# Patient Record
Sex: Female | Born: 1935
Health system: Southern US, Community
[De-identification: ages and names within clinical notes are randomized; demographics above are authoritative.]

## PROBLEM LIST (undated history)

## (undated) DIAGNOSIS — I10 Essential (primary) hypertension: Secondary | ICD-10-CM

## (undated) DIAGNOSIS — Z973 Presence of spectacles and contact lenses: Secondary | ICD-10-CM

## (undated) DIAGNOSIS — G629 Polyneuropathy, unspecified: Secondary | ICD-10-CM

## (undated) DIAGNOSIS — M542 Cervicalgia: Secondary | ICD-10-CM

## (undated) DIAGNOSIS — Z9889 Other specified postprocedural states: Secondary | ICD-10-CM

## (undated) DIAGNOSIS — Z862 Personal history of diseases of the blood and blood-forming organs and certain disorders involving the immune mechanism: Secondary | ICD-10-CM

## (undated) DIAGNOSIS — Z950 Presence of cardiac pacemaker: Secondary | ICD-10-CM

## (undated) DIAGNOSIS — A419 Sepsis, unspecified organism: Secondary | ICD-10-CM

## (undated) DIAGNOSIS — E119 Type 2 diabetes mellitus without complications: Secondary | ICD-10-CM

## (undated) DIAGNOSIS — K219 Gastro-esophageal reflux disease without esophagitis: Secondary | ICD-10-CM

## (undated) DIAGNOSIS — J449 Chronic obstructive pulmonary disease, unspecified: Secondary | ICD-10-CM

## (undated) DIAGNOSIS — E785 Hyperlipidemia, unspecified: Secondary | ICD-10-CM

## (undated) DIAGNOSIS — J441 Chronic obstructive pulmonary disease with (acute) exacerbation: Secondary | ICD-10-CM

## (undated) DIAGNOSIS — N3941 Urge incontinence: Secondary | ICD-10-CM

## (undated) DIAGNOSIS — K5730 Diverticulosis of large intestine without perforation or abscess without bleeding: Secondary | ICD-10-CM

## (undated) DIAGNOSIS — I442 Atrioventricular block, complete: Secondary | ICD-10-CM

## (undated) DIAGNOSIS — I495 Sick sinus syndrome: Secondary | ICD-10-CM

## (undated) DIAGNOSIS — H409 Unspecified glaucoma: Secondary | ICD-10-CM

## (undated) DIAGNOSIS — J45909 Unspecified asthma, uncomplicated: Secondary | ICD-10-CM

## (undated) DIAGNOSIS — I519 Heart disease, unspecified: Secondary | ICD-10-CM

## (undated) DIAGNOSIS — Z8674 Personal history of sudden cardiac arrest: Secondary | ICD-10-CM

## (undated) DIAGNOSIS — Z8719 Personal history of other diseases of the digestive system: Secondary | ICD-10-CM

## (undated) HISTORY — PX: OTHER SURGICAL HISTORY: SHX169

## (undated) HISTORY — DX: Heart disease, unspecified: I51.9

## (undated) HISTORY — PX: ABDOMINAL HYSTERECTOMY: SHX81

## (undated) HISTORY — DX: Cervicalgia: M54.2

## (undated) HISTORY — DX: Chronic obstructive pulmonary disease with (acute) exacerbation: J44.1

## (undated) HISTORY — DX: Sepsis, unspecified organism: A41.9

## (undated) HISTORY — PX: RECTOCELE REPAIR: SHX761

## (undated) HISTORY — PX: COLONOSCOPY WITH ESOPHAGOGASTRODUODENOSCOPY (EGD): SHX5779

---

## 1988-03-12 HISTORY — PX: CATARACT EXTRACTION W/ INTRAOCULAR LENS  IMPLANT, BILATERAL: SHX1307

## 1997-11-20 ENCOUNTER — Emergency Department (HOSPITAL_COMMUNITY): Admission: EM | Admit: 1997-11-20 | Discharge: 1997-11-20 | Payer: Self-pay | Admitting: Emergency Medicine

## 1997-12-30 ENCOUNTER — Ambulatory Visit (HOSPITAL_COMMUNITY): Admission: RE | Admit: 1997-12-30 | Discharge: 1997-12-30 | Payer: Self-pay | Admitting: Internal Medicine

## 1997-12-30 ENCOUNTER — Encounter (HOSPITAL_BASED_OUTPATIENT_CLINIC_OR_DEPARTMENT_OTHER): Payer: Self-pay | Admitting: Internal Medicine

## 1999-01-06 ENCOUNTER — Encounter (HOSPITAL_BASED_OUTPATIENT_CLINIC_OR_DEPARTMENT_OTHER): Payer: Self-pay | Admitting: Internal Medicine

## 1999-01-06 ENCOUNTER — Ambulatory Visit (HOSPITAL_COMMUNITY): Admission: RE | Admit: 1999-01-06 | Discharge: 1999-01-06 | Payer: Self-pay | Admitting: Internal Medicine

## 2000-01-09 ENCOUNTER — Encounter (HOSPITAL_BASED_OUTPATIENT_CLINIC_OR_DEPARTMENT_OTHER): Payer: Self-pay | Admitting: Internal Medicine

## 2000-01-09 ENCOUNTER — Ambulatory Visit (HOSPITAL_COMMUNITY): Admission: RE | Admit: 2000-01-09 | Discharge: 2000-01-09 | Payer: Self-pay | Admitting: Internal Medicine

## 2000-05-23 ENCOUNTER — Ambulatory Visit (HOSPITAL_COMMUNITY): Admission: RE | Admit: 2000-05-23 | Discharge: 2000-05-23 | Payer: Self-pay | Admitting: Gastroenterology

## 2001-02-04 ENCOUNTER — Ambulatory Visit (HOSPITAL_COMMUNITY): Admission: RE | Admit: 2001-02-04 | Discharge: 2001-02-04 | Payer: Self-pay | Admitting: Internal Medicine

## 2001-02-04 ENCOUNTER — Encounter: Payer: Self-pay | Admitting: Internal Medicine

## 2002-02-23 ENCOUNTER — Ambulatory Visit (HOSPITAL_COMMUNITY): Admission: RE | Admit: 2002-02-23 | Discharge: 2002-02-23 | Payer: Self-pay | Admitting: Internal Medicine

## 2002-02-23 ENCOUNTER — Encounter: Payer: Self-pay | Admitting: Internal Medicine

## 2002-10-19 ENCOUNTER — Ambulatory Visit (HOSPITAL_COMMUNITY): Admission: RE | Admit: 2002-10-19 | Discharge: 2002-10-19 | Payer: Self-pay | Admitting: Internal Medicine

## 2003-03-03 ENCOUNTER — Ambulatory Visit (HOSPITAL_COMMUNITY): Admission: RE | Admit: 2003-03-03 | Discharge: 2003-03-03 | Payer: Self-pay | Admitting: Internal Medicine

## 2004-04-14 ENCOUNTER — Ambulatory Visit (HOSPITAL_COMMUNITY): Admission: RE | Admit: 2004-04-14 | Discharge: 2004-04-14 | Payer: Self-pay | Admitting: Internal Medicine

## 2005-03-21 ENCOUNTER — Ambulatory Visit: Payer: Self-pay | Admitting: Gastroenterology

## 2005-03-21 ENCOUNTER — Inpatient Hospital Stay (HOSPITAL_COMMUNITY): Admission: EM | Admit: 2005-03-21 | Discharge: 2005-03-23 | Payer: Self-pay | Admitting: Emergency Medicine

## 2005-03-22 ENCOUNTER — Ambulatory Visit: Payer: Self-pay | Admitting: Gastroenterology

## 2005-03-23 ENCOUNTER — Encounter: Payer: Self-pay | Admitting: Gastroenterology

## 2005-05-14 ENCOUNTER — Ambulatory Visit (HOSPITAL_COMMUNITY): Admission: RE | Admit: 2005-05-14 | Discharge: 2005-05-14 | Payer: Self-pay | Admitting: Internal Medicine

## 2005-05-17 ENCOUNTER — Encounter: Admission: RE | Admit: 2005-05-17 | Discharge: 2005-05-17 | Payer: Self-pay | Admitting: Internal Medicine

## 2005-11-05 ENCOUNTER — Ambulatory Visit: Payer: Self-pay | Admitting: Gastroenterology

## 2005-11-08 ENCOUNTER — Ambulatory Visit: Payer: Self-pay | Admitting: Gastroenterology

## 2005-11-09 ENCOUNTER — Ambulatory Visit (HOSPITAL_COMMUNITY): Admission: RE | Admit: 2005-11-09 | Discharge: 2005-11-09 | Payer: Self-pay | Admitting: Gastroenterology

## 2005-11-09 ENCOUNTER — Encounter: Payer: Self-pay | Admitting: Internal Medicine

## 2005-11-14 ENCOUNTER — Inpatient Hospital Stay (HOSPITAL_COMMUNITY): Admission: EM | Admit: 2005-11-14 | Discharge: 2005-11-14 | Payer: Self-pay | Admitting: Emergency Medicine

## 2005-11-20 ENCOUNTER — Ambulatory Visit: Payer: Self-pay | Admitting: Internal Medicine

## 2005-12-07 ENCOUNTER — Ambulatory Visit: Payer: Self-pay | Admitting: Gastroenterology

## 2006-06-17 ENCOUNTER — Encounter: Admission: RE | Admit: 2006-06-17 | Discharge: 2006-06-17 | Payer: Self-pay | Admitting: Internal Medicine

## 2006-06-19 ENCOUNTER — Ambulatory Visit (HOSPITAL_COMMUNITY): Admission: RE | Admit: 2006-06-19 | Discharge: 2006-06-19 | Payer: Self-pay | Admitting: Internal Medicine

## 2006-11-04 ENCOUNTER — Ambulatory Visit: Payer: Self-pay | Admitting: Internal Medicine

## 2007-07-21 ENCOUNTER — Encounter: Admission: RE | Admit: 2007-07-21 | Discharge: 2007-07-21 | Payer: Self-pay | Admitting: Internal Medicine

## 2008-08-16 ENCOUNTER — Encounter: Admission: RE | Admit: 2008-08-16 | Discharge: 2008-08-16 | Payer: Self-pay | Admitting: Internal Medicine

## 2009-09-16 ENCOUNTER — Encounter: Admission: RE | Admit: 2009-09-16 | Discharge: 2009-09-16 | Payer: Self-pay | Admitting: Internal Medicine

## 2010-03-12 HISTORY — PX: INTERSTIM IMPLANT PLACEMENT: SHX5130

## 2010-04-02 ENCOUNTER — Encounter: Payer: Self-pay | Admitting: Internal Medicine

## 2010-07-25 NOTE — Assessment & Plan Note (Signed)
[facility]                         GASTROENTEROLOGY OFFICE NOTE   HILLIARY, JOCK                     MRN:          [id number]  DATE:11/04/2006                            DOB:          [birth year]    CHIEF COMPLAINT:  Incontinence.   Ms. Wendy Velasquez is a lady who has been seen by Dr. Corinda Gubler over time.  Her  problems are:  1. Chronic occult blood loss anemia.  2. Cameron's erosions and large hiatal hernia causing #1.  3. Diverticulosis, last colonoscopy January 2007.  4. Small-bowel capsule endoscopy showing Cameron's erosions.  5. Diabetes mellitus type 2 on insulin.  6. Chronic obstructive pulmonary disease.  7. Asthmatic bronchitis.  8. Urinary incontinence, chronic.  9. Seasonal allergies.   She has been having problems with small seepage of stool off and on.  It  is loose.  She is on Colace two a day along with her iron therapy to try  to prevent constipation.  Her other medications also reviewed in the  chart.  No bleeding noted.  Her hemoglobin was normal in April though  her iron saturation was still low and her TIBC was still high.   Medications listed and reviewed in the chart.  No known drug allergies.   Weight 161 pounds, pulse 64, blood pressure 138/80.  Rectal exam in the  presence of female nursing staff shows lax resting sphincter tone,  decreased squeeze.  There is no rectocele, there is no mass.   ASSESSMENT:  1. Fecal incontinence on the basis of lax sphincter tone.  She says      her problems with urinary and fecal incontinence started after a      hysterectomy so presumably this is some type of problem from that.      She had three children and no significant birth trauma.  2. Chronic occult blood loss anemia secondary to Cameron's erosions.  3. Hiatal hernia.   PLAN:  1. Continue current therapy which includes Nexium and iron therapy.      She will need regularly scheduled CBC.  She just had one from Dr.  Wylene Simmer and he is to send the results to me, she says.  I told her      she can go to one iron a day I think.  She is going to need to stay      on that chronically and probably have three to four CBCs a year,      perhaps quarterly.  2. I do not think she has ever really had colon polyps in the past, so      the next one at the earliest would be 5 years and probably 10, at      which point she would be well into her 59s.  Otherwise, as needed      appropriate due to signs or symptoms.   ADDENDUM:  We will ask her to try some fiber supplements like Metamucil  wafers or caplets, or perhaps Fibercon, though she was given samples of  the Metamucil preparations to try to help with her incontinence.  This  might work  better than taking Colace, as that may be aggravating things.     Iva Boop, MD,FACG  Electronically Signed    CEG/MedQ  DD: 11/04/2006  DT: 11/05/2006  Job #: [id number]   cc:   Gaspar Garbe, M.D.

## 2010-07-25 NOTE — Assessment & Plan Note (Signed)
[facility]                         GASTROENTEROLOGY OFFICE NOTE   NAME:Baller, Wendy Velasquez                     MRN:          [id number]  DATE:11/04/2006                            DOB:          16-Dec-1925    ADDENDUM  We will ask her to try some fiber supplements like Metamucil wafers or  caplets, or perhaps Fibercon, though she was given samples of the  Metamucil preparations to try to help with her incontinence.  This might  work better than taking Colace, as that may be aggravating things.     Iva Boop, MD,FACG  Electronically Signed    CEG/MedQ  DD: 11/04/2006  DT: 11/05/2006  Job #: [id number]

## 2010-07-28 NOTE — Discharge Summary (Signed)
NAMEBERNETTA, Wendy Velasquez              ACCOUNT NO.:  [id number]   MEDICAL RECORD NO.:  [id number]          PATIENT TYPE:  INP   LOCATION:  1413                         FACILITY:  [facility]   PHYSICIAN:  Malcolm T. Russella Dar, M.D. [facility] OF BIRTH:  09/19/1925   DATE OF ADMISSION:  03/21/2005  DATE OF DISCHARGE:  03/23/2005                                 DISCHARGE SUMMARY   ADMITTING DIAGNOSES:  32.  A 77 year old female with severe anemia, microcytic, currently heme-      negative.  Rule out chronic occult gastrointestinal loss, rule out iron      deficiency secondary to malabsorption.  2.  Chronic obstructive pulmonary disease  3.  Asthmatic bronchitis.  4.  Insulin-dependent diabetes mellitus.   DISCHARGE DIAGNOSES:  1.  Anemia likely secondary to chronic gastrointestinal blood loss from      [facility] erosions in hiatal hernia.  2.  Iron deficiency anemia, improved, post transfusions.  3.  Asthmatic bronchitis.  4.  Insulin-dependent diabetes mellitus.   CONSULTATIONS:  None.   PROCEDURES:  1.  Upper endoscopy.  2.  Colonoscopy.   HISTORY OF PRESENT ILLNESS:  Wendy Velasquez is a pleasant, 77 year old, white female  known to Dr. Victorino Dike who does have a history of prior GI workup in 2002,  done for anemia.  Colonoscopy showed diverticulosis and she had a small area  of inflammation in the rectum.  Biopsies were benign. Endoscopy showed some  friable gastric mucosal folds on the distal esophageal stricture which was  dilated.  She was referred today to Dr. Corinda Gubler for evaluation of her  anemia.  Recent finding of hemoglobin of 7.1, MCV of 60.  The patient  reports that she had not been well since October 2006, when she developed  bronchitis and has had some progressive shortness of breath and fatigue  since that time.  She denied any abdominal discomfort, nausea, vomiting,  dysphasia, etc.  Her bowel habits have been normal.  She had not noted any  melena or hematochezia.  She does take one  baby aspirin per day.  No other  thinners or NSAIDs.  She was seen in our office today and noted to be weak  and short of breath, though hemodynamically stable.  She was admitted to the  hospital for transfusions and further diagnostic workup.  Of note, stool on  admission was Hemoccult negative.   LABORATORY DATA AND X-RAY FINDINGS:  Laboratory studies on admission WBC of  7.8, hemoglobin 8, hematocrit of 26.7, MCV of 60, platelets 539.  Post  transfusion hemoglobin 10.5, hematocrit of 33.6.  Protime 13.5, INR 1.  Electrolytes within normal limits.  Glucose was 196, BUN 20, creatinine 1.0,  albumin 3.6.  Iron studies with iron less than 10, saturation not  calculated.  B12 level and folate level both normal.  Ferritin level low at  3.  Liver function studies normal.   X-ray studies with chest x-ray on admission with COPD, moderate to large  hiatal hernia.   HOSPITAL COURSE:  The patient was admitted to the service of Dr. Victorino Dike  and cared for by  Dr. Claudette Head who was covering the hospital.  She was  initially transfused 2 units of packed RBCs and was noted Hemoccult  negative.  Iron studies were consistent with iron deficiency. She was  scheduled for upper endoscopy and a colonoscopy on January 12, and underwent  prep on January 11.  Colonoscopy showed a left colon diverticulosis and  upper endoscopy showed a benign stricture in the distal esophagus.  A large,  8-cm hiatal hernia in the cardia of the stomach.  She was noted to have  erosions consistent with Sheria Lang erosions and it was felt that these were  the likely source of her chronic blood loss anemia.   The patient tolerated procedures without difficulty and was allowed  discharge to home later that day.   FOLLOW UP:  Follow up with Dr. Wylene Simmer in 2 weeks.  Dr. Victorino Dike as  needed.   DIET:  Diabetic as previous.   DISCHARGE MEDICATIONS:  All medications same as on admission with the  exception that she was to  take the Nexium 40 mg p.o. every day, which she  had not been doing.  We changed her Lasix to 20 mg daily and Nu-Iron 150 mg  twice daily which will be long-term.   CONDITION ON DISCHARGE:  Stable and improved.      Amy Esterwood, P.A.-C. [facility]      Malcolm T. Russella Dar, M.D. [facility]  Electronically Signed    AE/MEDQ  D:  04/06/2005  T:  04/07/2005  Job:  [id number]   cc:   Gaspar Garbe, M.D.  Fax: [phone]

## 2010-07-28 NOTE — Op Note (Signed)
NAMEAYEN, VIVIANO              ACCOUNT NO.:  [id number]   MEDICAL RECORD NO.:  [id number]          PATIENT TYPE:  AMB   LOCATION:  ENDO                         FACILITY:  [facility]   PHYSICIAN:  Iva Boop, MD,FACGDATE OF BIRTH:  February 03, 1926   DATE OF PROCEDURE:  11/09/2005  DATE OF DISCHARGE:  11/09/2005                                 OPERATIVE REPORT   REQUESTING PHYSICIAN:  Terrial Rhodes, MD.   PROCEDURE:  Small bowel capsule endoscopy.   INDICATIONS:  A 77 year old woman with chronic recurrent iron-deficiency  anemia thought due to occult blood loss.   PROCEDURE DATA:  Height 64 inches, weight 150 pounds, normal build.  Gastric  passage time is somewhat prolonged at 2 hours 4 minutes.  Small bowel  passage time 3 hours 20 minutes.   FINDINGS:  1. This is a complete study.  2. There was a good prep.  3. There was distal esophageal inflammation which was mild.  4. There were erosions to the hiatal hernia sac consistent with Cameron's      erosions.  5. The capsule spent over an hour in the hiatal hernia.  6. No small bowel AVMs or other lesions or sources of anemia identified.   RECOMMENDATIONS AND PLAN:  1. I think the anemia may be due to Cameron's erosions.  2. If there were symptoms of dysphagia or other clinical concerns, an      upper GI series may be appropriate to assess the hiatal hernia and      exclude a paraesophageal component, which could required surgery if she      were a surgical candidate.      Iva Boop, MD,FACG  Electronically Signed     CEG/MEDQ  D:  11/16/2005  T:  11/17/2005  Job:  [id number]

## 2010-07-28 NOTE — Assessment & Plan Note (Signed)
[facility]                           GASTROENTEROLOGY OFFICE NOTE   NAME:TOWNSENDEmiyah, Spraggins                     MRN:          [id number]  DATE:11/05/2005                            DOB:          31-Jul-1925    This very sweet patient comes in with her 2 daughters to discuss her anemia  that has been questionable internal bleeding.  She has been taking iron up  to 3 a day as her per primary care doctor, Dr. Wylene Simmer.   She has been diagnosed with Sheria Lang erosions.  This was assessed by Dr.  Russella Dar in January and she has known significant hiatal hernia that  precipitates this occurrence.  She has taken Nexium tablets but has not been  consistent with this.  She has taken iron in the past but has not been  consistent with it.   Her other medications include Humulin, Humalog, Glucophage, Ziac, Diovan,  Ditropan, amitriptyline, Nexium 40 mg daily, sometimes twice a day, Reglan  at bedtime a half, Advair, Flonase, Lumigan, Lasix 40 mg, steroids in  different doses, iron as noted and Combivent.   Last colonoscopic examination was done by Dr. Russella Dar in January as well and  she was found to have diverticular disease and nothing else of significance.  Hiatal hernia was noted at 8 cm.  She had Cameron erosions.  Dr. Russella Dar felt  like that she should be on long-term iron replacement.   MEDICAL PROBLEMS:  1. Have included COPD.  2. Asthmatic bronchitis.  3. Insulin-dependent diabetes.   PHYSICAL EXAMINATION:  Today, she is very pale.  Her blood pressure is  138/78, her pulse is 68 and regular.  Her oropharynx was normal.  HEAD AND NECK:  Negative.  Her thyroid was normal to palpation.  HEART:  Revealed a regular rhythm without a significant murmur.  No bruits  or rubs were noted.  CHEST:  Clear.  ABDOMEN:  Soft, no mass or organomegaly.  It was nontender.  There were no  inguinal nodes noted.  EXTREMITIES:  Unremarkable.   IMPRESSION:  1. Significant iron  deficiency anemia probably related to the [facility]      erosions.  2. Insulin-dependent diabetes.  3. Chronic obstructive pulmonary disease.  4. Asthmatic bronchitis.  5. Diverticulosis.   RECOMMENDATIONS:  To have her return home under her daughters' care and we  will get blood studies on her to see where she stands today.  She is quite  weak but her vital signs were normal.  I told her that she should be on  Nexium b.i.d. for at least another week or 10 days and then she can go back  to one a day and I also want to put her on some Carafate suspension which I  gave her to take 1 teaspoon t.i.d. between meals.  If this is giving her  relief, we also discussed discontinuing her Reglan at bedtime, which I  really do not think is very effective and she should elevate the head of her  bed if possible.  She should stay on iron and be followed on every several  months  with CBC to see what is happening to her blood count.  In the  meantime, we also would like to get a capsule endoscopy on her to see if we  can determine any other possible links to this anemia.  I think it certainly  could result from her Sheria Lang erosions.  I really do not see any need to  reassess this situation.  We will just treat it the same and I think that  capsular endoscopy would be important in that she has had this chronic  anemia and we want to rule out any other possibilities of the remaining GI  tract that may be something more definitive could be performed.  I told her  daughter to call us if she has any further difficulty.                                   Ulyess Mort, MD   SML/MedQ  DD:  11/05/2005  DT:  11/06/2005  Job #:  [id number]   cc:   Gaspar Garbe, MD

## 2010-07-28 NOTE — Discharge Summary (Signed)
Wendy Velasquez, Wendy Velasquez              ACCOUNT NO.:  [id number]   MEDICAL RECORD NO.:  [id number]          PATIENT TYPE:  INP   LOCATION:  1613                         [facility]ACILITY[facility]   PHYSICIAN:  Gaspar Garbe, M.D.DATE OF BIRTH:  [birth year]   DATE OF ADMISSION:  11/13/2005  DATE OF DISCHARGE:  11/14/2005                                 DISCHARGE SUMMARY   DIAGNOSES:  1. Weakness not otherwise specified.  2. Anemia requiring transfusion.  3. Chronic gastrointestinal (GI) blood loss, site unknown.  4. Diabetes mellitus type 2.  5. Chronic obstructive pulmonary disease (COPD) with recent exacerbations      in the past month.  6. Iron deficiency anemia as noted above.  7. Urinary incontinence.  8. Seasonal allergies.   DISCHARGE MEDICATIONS:  1. Glucophage 1000 mg p.o. b.i.d.  2. Humulin N 10 units subcutaneous b.i.d.  3. Sliding scale Humalog as usual.  4. Nexium 40 mg p.o. b.i.d.  5. Nu-Iron one tablet three times daily with vitamin C at meals.  6. Advair 250/50 one puff twice daily.  7. Flonase p.r.n.  8. Singulair 10 mg p.o. daily.  9. Ditropan XL 15 mg q.h.s.  10.Atrovent inhaler 2 puffs q.i.d.  11.Carafate one dose three times daily.   PROCEDURE:  The patient underwent transfusion of two units of packed red  blood cells.  No other procedures were performed.   LABS:  Initial hemoglobin 9.8, post transfusion 13.4   DISCHARGE PHYSICAL EXAMINATION:  VITAL SIGNS:  On date of discharge,  temperature 98, pulse 73, respiratory rate 20, blood pressure slightly  elevated at 144/70.  CBG 116.  Saturation 93% on room air.  GENERAL:  In no acute distress.  HEENT:  Normocephalic, atraumatic.  PERRLA.  EOMI.  ENT is within normal  limits.  NECK:  Supple.  No lymphadenopathy, JVD, or bruits.  HEART:  Regular rate and rhythm without murmur, rub, or gallop.  LUNGS:  Clear to auscultation bilaterally.  ABDOMEN:  Soft, nontender.  Normoactive bowel sounds.  No  hepatosplenomegaly  appreciated.  The patient is guaiac negative.  EXTREMITIES:  No clubbing, cyanosis, or edema.   HOSPITAL COURSE:  The patient was admitted by my partner, Dr. Ernestina Patches, late on the evening of November 13, 2005.  The patient indicates  that she had been in contact with [facility] extensively over the weekend,  calling their office.  Her family was notified that she would not be offered  an office visit and to go to the emergency room.  She was at the emergency  room for approximately 7 hours during which time [facility] was apparently  not notified.  My partner was notified in their place indicating that the  patient required admission for transfusion.  He graciously accepted the  patient and transfused her two units of packed red blood cells.  Ongoing  over the prior week was the fact that she had undergone capsule endoscopy  the previous Friday as the patient has an issue with chronic GI blood loss.  She has undergone colonoscopy which had not found a source, endoscopy which  was presumed to have seen Cameron's lesions although no active bleeding has  ever been noted in her and on the date of discharge, I spoke personally with  Dr. Victorino Dike who did not have a result in her capsule endoscopy.  Of  note, the patient had attempted to be placed on IV iron and had a reaction  to this in the past.  Therefore, she requires p.o. maintenance.  She was  transfused with two units of packed red blood cells and underwent a six-  minute walk test without desaturation and was discharged home to her usual  care with no changes made in her medications.  I indicated that she needed  to contact Dr. Blossom Hoops office within the next day to get the results of  her endoscopy and for them to further complete her workup.  The patient was  satisfied with this and was discharged home into the care of her family.  [place] and I  indicated  that if she has any further difficulty, she is to contact her doctor up  there who is a pulmonologist who has done other blood testing for [place] given  her absence from [place].   DISCHARGE INSTRUCTIONS:  The patient is to notify us of any dark tarry or  suspicious-looking bowel movements or any bleeding immediately if this  occurs.  If she becomes short of breath especially while away, she is to  [place]up and care  and to notify Dr. Blossom Hoops office if any GI blood loss issues.  The patient  upon her return which is to be September 26 will contact my office and  arrange for a CBC to be drawn at which time other iron interventions may be  necessary as she continues to have low iron saturations which are most  likely causing her weakness.      Gaspar Garbe, M.D.  Electronically Signed     RWT/MEDQ  D:  11/14/2005  T:  11/15/2005  Job:  [id number]   cc:   Ulyess Mort, MD  [address]  [place]  [place] [address]

## 2010-07-28 NOTE — Assessment & Plan Note (Signed)
[facility]                           GASTROENTEROLOGY OFFICE NOTE   NAME:Wendy Velasquez, Wendy Velasquez                     MRN:          [id number]  DATE:12/07/2005                            DOB:          [birth year]    This sweet lady comes in after having several transfusions which she was  found to be quite anemic one month previous.  She says she seems to be doing  better although she is tired.  Her reflux has been bad.  She has had no  visible blood.  She has taken a number of medications including Humulin  insulin, Humalog, Glucophage, Diovan, Ditropan, amitriptyline, Nexium,  Reglan, Advair, Flonase, Lumigan, Lasix, steroid shorts, iron three times  daily, prednisone, Carafate and __________ .  She recently had repeat blood  studies by Dr. Wylene Simmer, the last one being on September 26 and her blood  studies were excellent with a hemoglobin 13.2 and hematocrit 28.5.  MCV was  somewhat low at 74.9 which goes along with her iron deficiency.  Platelet  count and everything else was quite normal.  I told her that she needed to  take her Nexium twice daily and if her insurance company would not allow  this, we needed to write a letter for the insurance company to do so.  She  says her reflux has been good.  I really would not increase her Reglan.  The  capsule endoscopy which she tolerated quite nicely failed to reveal any  mucosal lesions.  This was a complete study, a good prep.  There were  erosions and hiatal hernia, consistent with Sheria Lang erosions.  There were no  small bowel arteriovenous malformations or other lesions  or source of  lesions to explain the anemia.  Dr. Leone Payor interpreted this and felt that  the anemia was secondary to [facility] erosions.   PHYSICAL EXAMINATION:  She did not look nearly as pale as when I had seen  her in the past.  Blood pressure 130/78, pulse 68.  Alert and oriented.  Extremities are unremarkable.   IMPRESSION:  1.  Chronic anemia probably related to bleeding secondary to [facility]      erosions.  The patient has a large hiatal hernia and reflux.  2. Insulin dependent diabetes.  3. Chronic obstructive pulmonary disease.  4. Asthmatic bronchitis.  5. History of diverticular disease.   RECOMMENDATIONS:  That she have blood counts.  I have written a prescription  for her to get it in [place], [place].  In a month she should get a  repeat CBC in a month in a hospital in [place], [place].  She is to  continue iron twice daily.  I also will order some serum iron binding  studies at that time to determine exactly how much iron she needed.  I told  her to call us if she had any problems and I did apologize several times for  the problems she experienced while she was in the hospital and had to sit in  the emergency room for such a long period of time which was ascribed to some  poor communication on our part. She was very receptive of this and it was  just one of these unfortunate things that occurred.  She has been extremely  nice people and they do have three children, grandchildren, live in  [place] so  they come here and can be followed here and I think that they appreciate the  opportunity to get their medical care here.            ______________________________  Ulyess Mort, MD      SML/MedQ  DD:  12/07/2005  DT:  12/10/2005  Job #:  [id number]   cc:   Gaspar Garbe, M.D.

## 2010-07-28 NOTE — H&P (Signed)
Wendy Velasquez, Wendy Velasquez              ACCOUNT NO.:  [id number]   MEDICAL RECORD NO.:  [id number]          PATIENT TYPE:  INP   LOCATION:  0101                         FACILITY:  [facility]   PHYSICIAN:  Ulyess Mort, M.D. LHCDATE OF BIRTH:  May 29, 1925   DATE OF ADMISSION:  03/21/2005  DATE OF DISCHARGE:                                HISTORY & PHYSICAL   CHIEF COMPLAINT:  Weakness and shortness of breath with exertion.   HISTORY:  Wendy Velasquez is a pleasant 77 year old white female known to Dr. Victorino Dike with history of prior GI workup in 2002 done for anemia. Colonoscopy  at that time showed diverticulosis and a 3 cm area of inflammation in the  rectum. Biopsies were taken and showed nonspecific inflammatory changes. EGD  was also done showing some friable mucosal folds, distal esophageal  stricture which was Maloney dilated.   The patient does have history of insulin-dependent diabetes mellitus, COPD,  asthmatic bronchitis, and glaucoma. She was referred to Dr. Corinda Gubler today  for evaluation of her anemia with recent finding of hemoglobin 7.1; MCV is  60. The patient reports that she has not been well since October 2006 when  she had bronchitis. She has had some progressive shortness of breath and  fatigue; has no complaints of abdominal discomfort, nausea, vomiting, and no  dysphagia. Bowel movements have been normal. There is no melena or  hematochezia. She does take one baby aspirin per day, no other thinners or  NSAIDs. She was seen in the office today and noted to be weak and short of  breath though hemodynamically stable, and is admitted for transfusions and  further diagnostic evaluation. Stool on admission hemoccult negative.   MEDICATIONS:  1.  Humulin N 10 units a.m. and p.m. daily.  2.  Humalog sliding scale.  3.  Glucophage 1000 mg b.i.d.  4.  Ziac 25 daily.  5.  Diovan 160 daily.  6.  Ditropan 5 mg daily.  7.  Amitriptyline 25 daily.  8.  Nexium 40 daily.  9.  Reglan 5  mg daily.  10. Advair 250/50 twice a day.  11. Flonase nasal spray daily.  12. Lumigan eyedrops.  13. Lasix 40 mg daily which just started earlier this week.   ALLERGIES:  No known drug allergies.   PAST MEDICAL HISTORY:  As outlined above. She is also status post  hysterectomy and has had rectocele repair.   SOCIAL HISTORY:  The patient is married, retired. She and her husband reside  in [place] and have family here in [place]. No tobacco, no EtOH.   FAMILY HISTORY:  Negative for GI disease.   REVIEW OF SYSTEMS:  Currently denies any chest pain or anginal symptoms.  Denies any cough or sputum production. GENITOURINARY:  Negative. GI:  As  above.   PHYSICAL EXAMINATION:  GENERAL:  Well-developed elderly white female in no  acute distress.  VITAL SIGNS:  Temperature is 97, blood pressure 138/74, pulse is 88.  HEENT:  Nontraumatic, normocephalic. EOMI, PERRLA. Sclerae anicteric. She is  quite pale. There is no JVD.  CARDIOVASCULAR:  Regular rate and rhythm  with S1 and S2.  PULMONARY:  Clear to A&P.  ABDOMEN:  Soft and nontender. There is no mass or hepatosplenomegaly. Bowel  sounds are active.  RECTAL:  Heme negative and without mass.  EXTREMITIES:  No clubbing or cyanosis. She has trace edema.   IMPRESSION:  9.  A 77 year old white female with severe anemia, microcytic, currently      heme negative; rule out chronic occult gastrointestinal loss; rule out      iron deficiency secondary to malabsorption.  2.  Chronic obstructive pulmonary disease.  3.  Asthmatic bronchitis.  4.  Insulin-dependent diabetes mellitus.   PLAN:  The patient admitted to the service of Dr. Victorino Dike. She will be  transfused 2 units of packed rbc's. Will check iron studies. Plan repeat EGD  and colonoscopy and then decide on need for outpatient capsule endoscopy.      Mike Gip, P.A.-C. [facility]    ______________________________  Ulyess Mort, M.D. [facility]    AE/MEDQ  D:  03/22/2005  T:   03/22/2005  Job:  [id number]   cc:   Gaspar Garbe, M.D.  Fax: [phone]

## 2010-07-28 NOTE — H&P (Signed)
[name], [name]              ACCOUNT NO.:  [id number]   MEDICAL RECORD NO.:  [id number]          PATIENT TYPE:  EMS   LOCATION:  ED                           FACILITY:  [facility]   PHYSICIAN:  Geoffry Paradise, M.D.  DATE OF BIRTH:  Jan 29, 1926   DATE OF ADMISSION:  11/13/2005  DATE OF DISCHARGE:                                HISTORY & PHYSICAL   CHIEF COMPLAINT:  Weakness and fall.   HISTORY OF PRESENT ILLNESS:  Wendy Velasquez is a very pleasant 77 year old  with longstanding diabetes mellitus type 2, chronic anemia with secondary  suspected GI blood loss, chronic, COPD/asthma, presenting to the emergency  room at 2:00 p.m. today upon orders from Dr. Blossom Hoops office of [facility],  secondary to weakness, fall and progressive anemia secondary to continued GI  blood loss.  She has been under extensive GI evaluation starting back in  January of 2007 when she was admitted by Dr. Victorino Dike for weakness and  shortness of breath secondary to GI blood loss.  She had an extensive  colonoscopy and upper GI endoscopy at that time by Dr. Claudette Head, and it  was felt that the chronic GI blood loss was from [facility] erosions in her  hiatal hernia.  She was transfused at that time, and noteworthy her  hemoglobin was approximately 8, and following transfusion she was 10.5.  Interestingly enough, since that time she has been on chronic iron  replacement and was seen as recently as November 05, 2005 by Dr. Victorino Dike  and set up with a capsular endoscopy which was completed last Friday, but  the results are still unclear.  Hemoglobin since that has been in the 9s,  and she is 9.8 upon presentation to the emergency room.  She does relate  that she has become progressively weaker in the last week and sustained one  fall with some minor rib injury.  She evidently has been in contact with the  GI service all weekend, and again today, whereupon she was instructed by Dr.  Richarda Osmond nurse to go to  the emergency room and that they would be  contacted and provided orders to include IV fluids.  The daughter relates,  as well, that she spoke to Dr. Blossom Hoops nurse sometime approximately 2 to 3  p.m. after presenting to the emergency room to let them know she was here.  We were contacted at approximately 10:00 p.m. for admission to include  admission for transfusion secondary to her progressive weakness.  While she  has had progressive weakness, her hemoglobin has dropped only slightly.  She  does continue on iron.  Denies any chest pain or shortness of breath.  She  has had no headache or dizziness.  She has had no fever, chills or sweats.  She has had no neurologic changes.   PAST MEDICAL HISTORY:   ALLERGIES:  The patient has no known drug allergies.   MEDICATIONS:  1. Glucophage 1000 b.i.d.  2. Humulin N 10 units b.i.d.  3. Sliding scale Humalog.  4. Nexium 40 b.i.d.  5. Nu-Iron b.i.d. to t.i.d.  6.  Advair 250/50 b.i.d.  7. Carafate.  8. Flonase.  9. Singulair 10 mg daily.  10.Ditropan XL 15 q.h.s.  11.Atrovent inhaler 2 puffs q.i.d.  12.Records indicate she is on Carafate, but she denies it.   MEDICAL ILLNESSES:  1. Diabetes mellitus type 2.  2. Remote history of hypertension.  3. Chronic anemia and GI blood loss.  4. Asthma.   SURGICAL ILLNESSES:  1. Colonoscopy.  2. Endoscopy.  3. Hysterectomy.  4. Rectocele repair.   FAMILY HISTORY:  Negative.   SOCIAL HISTORY:  The patient is [place]  Daughter and son-in-law live locally.   PHYSICAL EXAMINATION:  VITAL SIGNS:  Temperature is 97.8, blood pressure  131/63, pulse 75, respiratory rate 18, O2 saturation 98%.  GENERAL:  The patient is bright, alert, conversant, looks actually quite  good.  SKIN:  Good turgor.  HEENT:  Oropharynx benign.  Anicteric.  Good facial symmetry.  NECK:  No JVD or bruits.  LUNGS:  Clear.  BACK:  Kyphotic.  CARDIOVASCULAR:  Regular rate and  rhythm.  No murmur, gallop, rub or heave.  ABDOMEN:  Soft, nontender.  Normal bowel sounds.  No hepatosplenomegaly.  EXTREMITIES:  No cyanosis, clubbing or edema.  Joints normal.  Pulses  intact.   ASSESSMENT:  1. Weakness and fall x1.  Seems to be a bit accelerated over the last      week, somewhat disproportionate to the chronicity of her anemia, but      she clearly has a significant anemia and evidence by history of chronic      gastrointestinal blood loss with gastrointestinal workup ongoing.  2. Diabetes mellitus type 2.  3. Hypertension.  4. Chronic obstructive pulmonary disease/asthma.   PLAN:  The patient will be transfused.  Further follow-up per Dr. Corinda Gubler  and the primary physician.  She is clinically stable at this time.           ______________________________  Geoffry Paradise, M.D.     RA/MEDQ  D:  11/13/2005  T:  11/14/2005  Job:  [id number]

## 2010-08-30 ENCOUNTER — Other Ambulatory Visit: Payer: Self-pay | Admitting: Internal Medicine

## 2010-08-30 DIAGNOSIS — Z1231 Encounter for screening mammogram for malignant neoplasm of breast: Secondary | ICD-10-CM

## 2010-11-01 ENCOUNTER — Ambulatory Visit
Admission: RE | Admit: 2010-11-01 | Discharge: 2010-11-01 | Disposition: A | Discharge status: Home / Self Care | Payer: Medicare Other | Source: Ambulatory Visit | Attending: Internal Medicine | Admitting: Internal Medicine

## 2010-11-01 DIAGNOSIS — Z1231 Encounter for screening mammogram for malignant neoplasm of breast: Secondary | ICD-10-CM

## 2011-03-15 DIAGNOSIS — E538 Deficiency of other specified B group vitamins: Secondary | ICD-10-CM | POA: Diagnosis not present

## 2011-03-20 DIAGNOSIS — IMO0002 Reserved for concepts with insufficient information to code with codable children: Secondary | ICD-10-CM | POA: Diagnosis not present

## 2011-03-20 DIAGNOSIS — M48061 Spinal stenosis, lumbar region without neurogenic claudication: Secondary | ICD-10-CM | POA: Diagnosis not present

## 2011-03-20 DIAGNOSIS — M1710 Unilateral primary osteoarthritis, unspecified knee: Secondary | ICD-10-CM | POA: Diagnosis not present

## 2011-03-20 DIAGNOSIS — M47817 Spondylosis without myelopathy or radiculopathy, lumbosacral region: Secondary | ICD-10-CM | POA: Diagnosis not present

## 2011-04-02 DIAGNOSIS — N390 Urinary tract infection, site not specified: Secondary | ICD-10-CM | POA: Diagnosis not present

## 2011-04-05 DIAGNOSIS — N3941 Urge incontinence: Secondary | ICD-10-CM | POA: Diagnosis not present

## 2011-04-16 DIAGNOSIS — E538 Deficiency of other specified B group vitamins: Secondary | ICD-10-CM | POA: Diagnosis not present

## 2011-04-16 DIAGNOSIS — N390 Urinary tract infection, site not specified: Secondary | ICD-10-CM | POA: Diagnosis not present

## 2011-05-02 DIAGNOSIS — D2360 Other benign neoplasm of skin of unspecified upper limb, including shoulder: Secondary | ICD-10-CM | POA: Diagnosis not present

## 2011-05-02 DIAGNOSIS — Z872 Personal history of diseases of the skin and subcutaneous tissue: Secondary | ICD-10-CM | POA: Diagnosis not present

## 2011-05-02 DIAGNOSIS — D485 Neoplasm of uncertain behavior of skin: Secondary | ICD-10-CM | POA: Diagnosis not present

## 2011-05-02 DIAGNOSIS — L820 Inflamed seborrheic keratosis: Secondary | ICD-10-CM | POA: Diagnosis not present

## 2011-05-14 DIAGNOSIS — E538 Deficiency of other specified B group vitamins: Secondary | ICD-10-CM | POA: Diagnosis not present

## 2011-06-07 DIAGNOSIS — J018 Other acute sinusitis: Secondary | ICD-10-CM | POA: Diagnosis not present

## 2011-06-07 DIAGNOSIS — J209 Acute bronchitis, unspecified: Secondary | ICD-10-CM | POA: Diagnosis not present

## 2011-06-15 DIAGNOSIS — E538 Deficiency of other specified B group vitamins: Secondary | ICD-10-CM | POA: Diagnosis not present

## 2011-06-22 DIAGNOSIS — M25519 Pain in unspecified shoulder: Secondary | ICD-10-CM | POA: Diagnosis not present

## 2011-06-22 DIAGNOSIS — M19019 Primary osteoarthritis, unspecified shoulder: Secondary | ICD-10-CM | POA: Diagnosis not present

## 2011-06-28 DIAGNOSIS — M5412 Radiculopathy, cervical region: Secondary | ICD-10-CM | POA: Diagnosis not present

## 2011-06-28 DIAGNOSIS — M47812 Spondylosis without myelopathy or radiculopathy, cervical region: Secondary | ICD-10-CM | POA: Diagnosis not present

## 2011-07-02 DIAGNOSIS — M542 Cervicalgia: Secondary | ICD-10-CM | POA: Diagnosis not present

## 2011-07-10 DIAGNOSIS — M542 Cervicalgia: Secondary | ICD-10-CM | POA: Diagnosis not present

## 2011-07-12 DIAGNOSIS — M542 Cervicalgia: Secondary | ICD-10-CM | POA: Diagnosis not present

## 2011-07-12 DIAGNOSIS — E538 Deficiency of other specified B group vitamins: Secondary | ICD-10-CM | POA: Diagnosis not present

## 2011-07-16 DIAGNOSIS — E538 Deficiency of other specified B group vitamins: Secondary | ICD-10-CM | POA: Diagnosis not present

## 2011-07-16 DIAGNOSIS — D531 Other megaloblastic anemias, not elsewhere classified: Secondary | ICD-10-CM | POA: Diagnosis not present

## 2011-07-16 DIAGNOSIS — E559 Vitamin D deficiency, unspecified: Secondary | ICD-10-CM | POA: Diagnosis not present

## 2011-07-16 DIAGNOSIS — E785 Hyperlipidemia, unspecified: Secondary | ICD-10-CM | POA: Diagnosis not present

## 2011-07-16 DIAGNOSIS — E119 Type 2 diabetes mellitus without complications: Secondary | ICD-10-CM | POA: Diagnosis not present

## 2011-07-16 DIAGNOSIS — D509 Iron deficiency anemia, unspecified: Secondary | ICD-10-CM | POA: Diagnosis not present

## 2011-07-16 DIAGNOSIS — J449 Chronic obstructive pulmonary disease, unspecified: Secondary | ICD-10-CM | POA: Diagnosis not present

## 2011-07-19 DIAGNOSIS — M542 Cervicalgia: Secondary | ICD-10-CM | POA: Diagnosis not present

## 2011-07-24 DIAGNOSIS — M542 Cervicalgia: Secondary | ICD-10-CM | POA: Diagnosis not present

## 2011-07-25 DIAGNOSIS — D649 Anemia, unspecified: Secondary | ICD-10-CM | POA: Diagnosis not present

## 2011-07-26 DIAGNOSIS — M542 Cervicalgia: Secondary | ICD-10-CM | POA: Diagnosis not present

## 2011-07-31 DIAGNOSIS — M542 Cervicalgia: Secondary | ICD-10-CM | POA: Diagnosis not present

## 2011-08-02 DIAGNOSIS — M542 Cervicalgia: Secondary | ICD-10-CM | POA: Diagnosis not present

## 2011-08-08 DIAGNOSIS — N390 Urinary tract infection, site not specified: Secondary | ICD-10-CM | POA: Diagnosis not present

## 2011-08-08 DIAGNOSIS — R32 Unspecified urinary incontinence: Secondary | ICD-10-CM | POA: Diagnosis not present

## 2011-08-09 DIAGNOSIS — E538 Deficiency of other specified B group vitamins: Secondary | ICD-10-CM | POA: Diagnosis not present

## 2011-08-14 DIAGNOSIS — M542 Cervicalgia: Secondary | ICD-10-CM | POA: Diagnosis not present

## 2011-08-16 DIAGNOSIS — N390 Urinary tract infection, site not specified: Secondary | ICD-10-CM | POA: Diagnosis not present

## 2011-08-23 DIAGNOSIS — M1710 Unilateral primary osteoarthritis, unspecified knee: Secondary | ICD-10-CM | POA: Diagnosis not present

## 2011-08-31 DIAGNOSIS — N3941 Urge incontinence: Secondary | ICD-10-CM | POA: Diagnosis not present

## 2011-09-06 DIAGNOSIS — E538 Deficiency of other specified B group vitamins: Secondary | ICD-10-CM | POA: Diagnosis not present

## 2011-09-21 DIAGNOSIS — H4011X0 Primary open-angle glaucoma, stage unspecified: Secondary | ICD-10-CM | POA: Diagnosis not present

## 2011-09-21 DIAGNOSIS — E119 Type 2 diabetes mellitus without complications: Secondary | ICD-10-CM | POA: Diagnosis not present

## 2011-09-21 DIAGNOSIS — Z961 Presence of intraocular lens: Secondary | ICD-10-CM | POA: Diagnosis not present

## 2011-10-01 ENCOUNTER — Other Ambulatory Visit: Payer: Self-pay | Admitting: Internal Medicine

## 2011-10-01 DIAGNOSIS — Z1231 Encounter for screening mammogram for malignant neoplasm of breast: Secondary | ICD-10-CM

## 2011-10-04 DIAGNOSIS — E538 Deficiency of other specified B group vitamins: Secondary | ICD-10-CM | POA: Diagnosis not present

## 2011-11-06 DIAGNOSIS — Z872 Personal history of diseases of the skin and subcutaneous tissue: Secondary | ICD-10-CM | POA: Diagnosis not present

## 2011-11-06 DIAGNOSIS — D485 Neoplasm of uncertain behavior of skin: Secondary | ICD-10-CM | POA: Diagnosis not present

## 2011-11-06 DIAGNOSIS — L820 Inflamed seborrheic keratosis: Secondary | ICD-10-CM | POA: Diagnosis not present

## 2011-11-08 DIAGNOSIS — E538 Deficiency of other specified B group vitamins: Secondary | ICD-10-CM | POA: Diagnosis not present

## 2011-11-26 DIAGNOSIS — M1710 Unilateral primary osteoarthritis, unspecified knee: Secondary | ICD-10-CM | POA: Diagnosis not present

## 2011-12-06 DIAGNOSIS — J301 Allergic rhinitis due to pollen: Secondary | ICD-10-CM | POA: Diagnosis not present

## 2011-12-10 DIAGNOSIS — E538 Deficiency of other specified B group vitamins: Secondary | ICD-10-CM | POA: Diagnosis not present

## 2012-01-07 DIAGNOSIS — J209 Acute bronchitis, unspecified: Secondary | ICD-10-CM | POA: Diagnosis not present

## 2012-01-07 DIAGNOSIS — R0602 Shortness of breath: Secondary | ICD-10-CM | POA: Diagnosis not present

## 2012-01-07 DIAGNOSIS — R062 Wheezing: Secondary | ICD-10-CM | POA: Diagnosis not present

## 2012-01-14 DIAGNOSIS — J209 Acute bronchitis, unspecified: Secondary | ICD-10-CM | POA: Diagnosis not present

## 2012-01-14 DIAGNOSIS — R5381 Other malaise: Secondary | ICD-10-CM | POA: Diagnosis not present

## 2012-01-14 DIAGNOSIS — R5383 Other fatigue: Secondary | ICD-10-CM | POA: Diagnosis not present

## 2012-01-14 DIAGNOSIS — Z79899 Other long term (current) drug therapy: Secondary | ICD-10-CM | POA: Diagnosis not present

## 2012-01-21 DIAGNOSIS — J209 Acute bronchitis, unspecified: Secondary | ICD-10-CM | POA: Diagnosis not present

## 2012-01-25 DIAGNOSIS — Z961 Presence of intraocular lens: Secondary | ICD-10-CM | POA: Diagnosis not present

## 2012-01-25 DIAGNOSIS — H4011X0 Primary open-angle glaucoma, stage unspecified: Secondary | ICD-10-CM | POA: Diagnosis not present

## 2012-01-25 DIAGNOSIS — E119 Type 2 diabetes mellitus without complications: Secondary | ICD-10-CM | POA: Diagnosis not present

## 2012-02-04 ENCOUNTER — Ambulatory Visit
Admission: RE | Admit: 2012-02-04 | Discharge: 2012-02-04 | Disposition: A | Discharge status: Home / Self Care | Payer: Medicare Other | Source: Ambulatory Visit | Attending: Internal Medicine | Admitting: Internal Medicine

## 2012-02-04 DIAGNOSIS — Z1231 Encounter for screening mammogram for malignant neoplasm of breast: Secondary | ICD-10-CM | POA: Diagnosis not present

## 2012-02-05 DIAGNOSIS — M810 Age-related osteoporosis without current pathological fracture: Secondary | ICD-10-CM | POA: Diagnosis not present

## 2012-02-05 DIAGNOSIS — J449 Chronic obstructive pulmonary disease, unspecified: Secondary | ICD-10-CM | POA: Diagnosis not present

## 2012-02-05 DIAGNOSIS — Z23 Encounter for immunization: Secondary | ICD-10-CM | POA: Diagnosis not present

## 2012-02-05 DIAGNOSIS — F329 Major depressive disorder, single episode, unspecified: Secondary | ICD-10-CM | POA: Diagnosis not present

## 2012-02-05 DIAGNOSIS — E119 Type 2 diabetes mellitus without complications: Secondary | ICD-10-CM | POA: Diagnosis not present

## 2012-02-05 DIAGNOSIS — E785 Hyperlipidemia, unspecified: Secondary | ICD-10-CM | POA: Diagnosis not present

## 2012-02-05 DIAGNOSIS — M47814 Spondylosis without myelopathy or radiculopathy, thoracic region: Secondary | ICD-10-CM | POA: Diagnosis not present

## 2012-02-05 DIAGNOSIS — M899 Disorder of bone, unspecified: Secondary | ICD-10-CM | POA: Diagnosis not present

## 2012-02-14 DIAGNOSIS — N390 Urinary tract infection, site not specified: Secondary | ICD-10-CM | POA: Diagnosis not present

## 2012-02-19 DIAGNOSIS — E538 Deficiency of other specified B group vitamins: Secondary | ICD-10-CM | POA: Diagnosis not present

## 2012-02-29 DIAGNOSIS — N390 Urinary tract infection, site not specified: Secondary | ICD-10-CM | POA: Diagnosis not present

## 2012-03-20 DIAGNOSIS — E538 Deficiency of other specified B group vitamins: Secondary | ICD-10-CM | POA: Diagnosis not present

## 2012-03-25 DIAGNOSIS — M810 Age-related osteoporosis without current pathological fracture: Secondary | ICD-10-CM | POA: Diagnosis not present

## 2012-03-25 DIAGNOSIS — E559 Vitamin D deficiency, unspecified: Secondary | ICD-10-CM | POA: Diagnosis not present

## 2012-04-16 DIAGNOSIS — R5383 Other fatigue: Secondary | ICD-10-CM | POA: Diagnosis not present

## 2012-04-16 DIAGNOSIS — E538 Deficiency of other specified B group vitamins: Secondary | ICD-10-CM | POA: Diagnosis not present

## 2012-04-16 DIAGNOSIS — Z79899 Other long term (current) drug therapy: Secondary | ICD-10-CM | POA: Diagnosis not present

## 2012-05-20 DIAGNOSIS — E119 Type 2 diabetes mellitus without complications: Secondary | ICD-10-CM | POA: Diagnosis not present

## 2012-05-20 DIAGNOSIS — R5381 Other malaise: Secondary | ICD-10-CM | POA: Diagnosis not present

## 2012-05-20 DIAGNOSIS — N319 Neuromuscular dysfunction of bladder, unspecified: Secondary | ICD-10-CM | POA: Diagnosis not present

## 2012-05-20 DIAGNOSIS — I10 Essential (primary) hypertension: Secondary | ICD-10-CM | POA: Diagnosis not present

## 2012-05-20 DIAGNOSIS — R55 Syncope and collapse: Secondary | ICD-10-CM | POA: Diagnosis not present

## 2012-05-20 DIAGNOSIS — J019 Acute sinusitis, unspecified: Secondary | ICD-10-CM | POA: Diagnosis present

## 2012-05-20 DIAGNOSIS — E1149 Type 2 diabetes mellitus with other diabetic neurological complication: Secondary | ICD-10-CM | POA: Diagnosis not present

## 2012-05-20 DIAGNOSIS — N390 Urinary tract infection, site not specified: Secondary | ICD-10-CM | POA: Diagnosis not present

## 2012-05-20 DIAGNOSIS — E785 Hyperlipidemia, unspecified: Secondary | ICD-10-CM | POA: Diagnosis not present

## 2012-05-20 DIAGNOSIS — R42 Dizziness and giddiness: Secondary | ICD-10-CM | POA: Diagnosis present

## 2012-05-22 DIAGNOSIS — R42 Dizziness and giddiness: Secondary | ICD-10-CM | POA: Diagnosis not present

## 2012-05-22 DIAGNOSIS — E1149 Type 2 diabetes mellitus with other diabetic neurological complication: Secondary | ICD-10-CM | POA: Diagnosis not present

## 2012-05-27 ENCOUNTER — Other Ambulatory Visit: Payer: Self-pay

## 2012-05-27 ENCOUNTER — Encounter: Payer: Self-pay | Admitting: Internal Medicine

## 2012-05-27 ENCOUNTER — Other Ambulatory Visit: Payer: Self-pay | Admitting: *Deleted

## 2012-05-27 ENCOUNTER — Encounter (INDEPENDENT_AMBULATORY_CARE_PROVIDER_SITE_OTHER): Payer: Medicare Other

## 2012-05-27 DIAGNOSIS — K219 Gastro-esophageal reflux disease without esophagitis: Secondary | ICD-10-CM | POA: Diagnosis not present

## 2012-05-27 DIAGNOSIS — R55 Syncope and collapse: Secondary | ICD-10-CM

## 2012-05-27 DIAGNOSIS — E559 Vitamin D deficiency, unspecified: Secondary | ICD-10-CM | POA: Diagnosis not present

## 2012-05-27 DIAGNOSIS — D509 Iron deficiency anemia, unspecified: Secondary | ICD-10-CM | POA: Diagnosis not present

## 2012-05-27 DIAGNOSIS — F329 Major depressive disorder, single episode, unspecified: Secondary | ICD-10-CM | POA: Diagnosis not present

## 2012-05-27 NOTE — Progress Notes (Signed)
Order from Dr Wylene Simmer [facility], for syncope/collapse.

## 2012-05-28 ENCOUNTER — Telehealth: Payer: Self-pay | Admitting: *Deleted

## 2012-05-28 NOTE — Telephone Encounter (Signed)
Event monitor placed on Pt on 05/27/12  TK

## 2012-06-05 DIAGNOSIS — R269 Unspecified abnormalities of gait and mobility: Secondary | ICD-10-CM | POA: Diagnosis not present

## 2012-06-05 DIAGNOSIS — D649 Anemia, unspecified: Secondary | ICD-10-CM | POA: Diagnosis not present

## 2012-06-05 DIAGNOSIS — M810 Age-related osteoporosis without current pathological fracture: Secondary | ICD-10-CM | POA: Diagnosis not present

## 2012-06-05 DIAGNOSIS — E538 Deficiency of other specified B group vitamins: Secondary | ICD-10-CM | POA: Diagnosis not present

## 2012-06-05 DIAGNOSIS — IMO0001 Reserved for inherently not codable concepts without codable children: Secondary | ICD-10-CM | POA: Diagnosis not present

## 2012-06-05 DIAGNOSIS — E119 Type 2 diabetes mellitus without complications: Secondary | ICD-10-CM | POA: Diagnosis not present

## 2012-06-09 DIAGNOSIS — IMO0001 Reserved for inherently not codable concepts without codable children: Secondary | ICD-10-CM | POA: Diagnosis not present

## 2012-06-09 DIAGNOSIS — R269 Unspecified abnormalities of gait and mobility: Secondary | ICD-10-CM | POA: Diagnosis not present

## 2012-06-09 DIAGNOSIS — E538 Deficiency of other specified B group vitamins: Secondary | ICD-10-CM | POA: Diagnosis not present

## 2012-06-09 DIAGNOSIS — D649 Anemia, unspecified: Secondary | ICD-10-CM | POA: Diagnosis not present

## 2012-06-09 DIAGNOSIS — M810 Age-related osteoporosis without current pathological fracture: Secondary | ICD-10-CM | POA: Diagnosis not present

## 2012-06-09 DIAGNOSIS — E119 Type 2 diabetes mellitus without complications: Secondary | ICD-10-CM | POA: Diagnosis not present

## 2012-06-10 DIAGNOSIS — D649 Anemia, unspecified: Secondary | ICD-10-CM | POA: Diagnosis not present

## 2012-06-10 DIAGNOSIS — E119 Type 2 diabetes mellitus without complications: Secondary | ICD-10-CM | POA: Diagnosis not present

## 2012-06-10 DIAGNOSIS — IMO0001 Reserved for inherently not codable concepts without codable children: Secondary | ICD-10-CM | POA: Diagnosis not present

## 2012-06-10 DIAGNOSIS — M810 Age-related osteoporosis without current pathological fracture: Secondary | ICD-10-CM | POA: Diagnosis not present

## 2012-06-10 DIAGNOSIS — R269 Unspecified abnormalities of gait and mobility: Secondary | ICD-10-CM | POA: Diagnosis not present

## 2012-06-10 DIAGNOSIS — E538 Deficiency of other specified B group vitamins: Secondary | ICD-10-CM | POA: Diagnosis not present

## 2012-06-12 DIAGNOSIS — R269 Unspecified abnormalities of gait and mobility: Secondary | ICD-10-CM | POA: Diagnosis not present

## 2012-06-12 DIAGNOSIS — E119 Type 2 diabetes mellitus without complications: Secondary | ICD-10-CM | POA: Diagnosis not present

## 2012-06-12 DIAGNOSIS — M810 Age-related osteoporosis without current pathological fracture: Secondary | ICD-10-CM | POA: Diagnosis not present

## 2012-06-12 DIAGNOSIS — D649 Anemia, unspecified: Secondary | ICD-10-CM | POA: Diagnosis not present

## 2012-06-12 DIAGNOSIS — E538 Deficiency of other specified B group vitamins: Secondary | ICD-10-CM | POA: Diagnosis not present

## 2012-06-12 DIAGNOSIS — IMO0001 Reserved for inherently not codable concepts without codable children: Secondary | ICD-10-CM | POA: Diagnosis not present

## 2012-06-15 DIAGNOSIS — M810 Age-related osteoporosis without current pathological fracture: Secondary | ICD-10-CM | POA: Diagnosis not present

## 2012-06-15 DIAGNOSIS — E119 Type 2 diabetes mellitus without complications: Secondary | ICD-10-CM | POA: Diagnosis not present

## 2012-06-15 DIAGNOSIS — D649 Anemia, unspecified: Secondary | ICD-10-CM | POA: Diagnosis not present

## 2012-06-15 DIAGNOSIS — R269 Unspecified abnormalities of gait and mobility: Secondary | ICD-10-CM | POA: Diagnosis not present

## 2012-06-15 DIAGNOSIS — IMO0001 Reserved for inherently not codable concepts without codable children: Secondary | ICD-10-CM | POA: Diagnosis not present

## 2012-06-15 DIAGNOSIS — E538 Deficiency of other specified B group vitamins: Secondary | ICD-10-CM | POA: Diagnosis not present

## 2012-06-19 DIAGNOSIS — D649 Anemia, unspecified: Secondary | ICD-10-CM | POA: Diagnosis not present

## 2012-06-19 DIAGNOSIS — E538 Deficiency of other specified B group vitamins: Secondary | ICD-10-CM | POA: Diagnosis not present

## 2012-06-19 DIAGNOSIS — M810 Age-related osteoporosis without current pathological fracture: Secondary | ICD-10-CM | POA: Diagnosis not present

## 2012-06-19 DIAGNOSIS — IMO0001 Reserved for inherently not codable concepts without codable children: Secondary | ICD-10-CM | POA: Diagnosis not present

## 2012-06-19 DIAGNOSIS — R269 Unspecified abnormalities of gait and mobility: Secondary | ICD-10-CM | POA: Diagnosis not present

## 2012-06-19 DIAGNOSIS — E119 Type 2 diabetes mellitus without complications: Secondary | ICD-10-CM | POA: Diagnosis not present

## 2012-06-20 DIAGNOSIS — D649 Anemia, unspecified: Secondary | ICD-10-CM | POA: Diagnosis not present

## 2012-06-20 DIAGNOSIS — IMO0001 Reserved for inherently not codable concepts without codable children: Secondary | ICD-10-CM | POA: Diagnosis not present

## 2012-06-20 DIAGNOSIS — E538 Deficiency of other specified B group vitamins: Secondary | ICD-10-CM | POA: Diagnosis not present

## 2012-06-20 DIAGNOSIS — M810 Age-related osteoporosis without current pathological fracture: Secondary | ICD-10-CM | POA: Diagnosis not present

## 2012-06-20 DIAGNOSIS — E119 Type 2 diabetes mellitus without complications: Secondary | ICD-10-CM | POA: Diagnosis not present

## 2012-06-20 DIAGNOSIS — R269 Unspecified abnormalities of gait and mobility: Secondary | ICD-10-CM | POA: Diagnosis not present

## 2012-06-23 DIAGNOSIS — R269 Unspecified abnormalities of gait and mobility: Secondary | ICD-10-CM | POA: Diagnosis not present

## 2012-06-23 DIAGNOSIS — IMO0001 Reserved for inherently not codable concepts without codable children: Secondary | ICD-10-CM | POA: Diagnosis not present

## 2012-06-23 DIAGNOSIS — E538 Deficiency of other specified B group vitamins: Secondary | ICD-10-CM | POA: Diagnosis not present

## 2012-06-23 DIAGNOSIS — D649 Anemia, unspecified: Secondary | ICD-10-CM | POA: Diagnosis not present

## 2012-06-23 DIAGNOSIS — M810 Age-related osteoporosis without current pathological fracture: Secondary | ICD-10-CM | POA: Diagnosis not present

## 2012-06-23 DIAGNOSIS — E119 Type 2 diabetes mellitus without complications: Secondary | ICD-10-CM | POA: Diagnosis not present

## 2012-06-25 DIAGNOSIS — IMO0001 Reserved for inherently not codable concepts without codable children: Secondary | ICD-10-CM | POA: Diagnosis not present

## 2012-06-25 DIAGNOSIS — R269 Unspecified abnormalities of gait and mobility: Secondary | ICD-10-CM | POA: Diagnosis not present

## 2012-06-25 DIAGNOSIS — E538 Deficiency of other specified B group vitamins: Secondary | ICD-10-CM | POA: Diagnosis not present

## 2012-06-25 DIAGNOSIS — E119 Type 2 diabetes mellitus without complications: Secondary | ICD-10-CM | POA: Diagnosis not present

## 2012-06-25 DIAGNOSIS — D649 Anemia, unspecified: Secondary | ICD-10-CM | POA: Diagnosis not present

## 2012-06-25 DIAGNOSIS — M810 Age-related osteoporosis without current pathological fracture: Secondary | ICD-10-CM | POA: Diagnosis not present

## 2012-06-27 DIAGNOSIS — M810 Age-related osteoporosis without current pathological fracture: Secondary | ICD-10-CM | POA: Diagnosis not present

## 2012-06-27 DIAGNOSIS — D649 Anemia, unspecified: Secondary | ICD-10-CM | POA: Diagnosis not present

## 2012-06-27 DIAGNOSIS — R269 Unspecified abnormalities of gait and mobility: Secondary | ICD-10-CM | POA: Diagnosis not present

## 2012-06-27 DIAGNOSIS — IMO0001 Reserved for inherently not codable concepts without codable children: Secondary | ICD-10-CM | POA: Diagnosis not present

## 2012-06-27 DIAGNOSIS — E538 Deficiency of other specified B group vitamins: Secondary | ICD-10-CM | POA: Diagnosis not present

## 2012-06-27 DIAGNOSIS — E119 Type 2 diabetes mellitus without complications: Secondary | ICD-10-CM | POA: Diagnosis not present

## 2012-06-30 DIAGNOSIS — M810 Age-related osteoporosis without current pathological fracture: Secondary | ICD-10-CM | POA: Diagnosis not present

## 2012-06-30 DIAGNOSIS — F329 Major depressive disorder, single episode, unspecified: Secondary | ICD-10-CM | POA: Diagnosis not present

## 2012-06-30 DIAGNOSIS — J449 Chronic obstructive pulmonary disease, unspecified: Secondary | ICD-10-CM | POA: Diagnosis not present

## 2012-06-30 DIAGNOSIS — E785 Hyperlipidemia, unspecified: Secondary | ICD-10-CM | POA: Diagnosis not present

## 2012-06-30 DIAGNOSIS — E559 Vitamin D deficiency, unspecified: Secondary | ICD-10-CM | POA: Diagnosis not present

## 2012-06-30 DIAGNOSIS — E119 Type 2 diabetes mellitus without complications: Secondary | ICD-10-CM | POA: Diagnosis not present

## 2012-06-30 DIAGNOSIS — D509 Iron deficiency anemia, unspecified: Secondary | ICD-10-CM | POA: Diagnosis not present

## 2012-06-30 DIAGNOSIS — K219 Gastro-esophageal reflux disease without esophagitis: Secondary | ICD-10-CM | POA: Diagnosis not present

## 2012-07-02 DIAGNOSIS — E119 Type 2 diabetes mellitus without complications: Secondary | ICD-10-CM | POA: Diagnosis not present

## 2012-07-02 DIAGNOSIS — E538 Deficiency of other specified B group vitamins: Secondary | ICD-10-CM | POA: Diagnosis not present

## 2012-07-02 DIAGNOSIS — IMO0001 Reserved for inherently not codable concepts without codable children: Secondary | ICD-10-CM | POA: Diagnosis not present

## 2012-07-02 DIAGNOSIS — M810 Age-related osteoporosis without current pathological fracture: Secondary | ICD-10-CM | POA: Diagnosis not present

## 2012-07-02 DIAGNOSIS — D649 Anemia, unspecified: Secondary | ICD-10-CM | POA: Diagnosis not present

## 2012-07-02 DIAGNOSIS — R269 Unspecified abnormalities of gait and mobility: Secondary | ICD-10-CM | POA: Diagnosis not present

## 2012-07-04 DIAGNOSIS — E119 Type 2 diabetes mellitus without complications: Secondary | ICD-10-CM | POA: Diagnosis not present

## 2012-07-04 DIAGNOSIS — E538 Deficiency of other specified B group vitamins: Secondary | ICD-10-CM | POA: Diagnosis not present

## 2012-07-04 DIAGNOSIS — D649 Anemia, unspecified: Secondary | ICD-10-CM | POA: Diagnosis not present

## 2012-07-04 DIAGNOSIS — R269 Unspecified abnormalities of gait and mobility: Secondary | ICD-10-CM | POA: Diagnosis not present

## 2012-07-04 DIAGNOSIS — IMO0001 Reserved for inherently not codable concepts without codable children: Secondary | ICD-10-CM | POA: Diagnosis not present

## 2012-07-04 DIAGNOSIS — M810 Age-related osteoporosis without current pathological fracture: Secondary | ICD-10-CM | POA: Diagnosis not present

## 2012-07-22 DIAGNOSIS — N3942 Incontinence without sensory awareness: Secondary | ICD-10-CM | POA: Diagnosis not present

## 2012-07-22 DIAGNOSIS — N3946 Mixed incontinence: Secondary | ICD-10-CM | POA: Diagnosis not present

## 2012-08-01 DIAGNOSIS — E538 Deficiency of other specified B group vitamins: Secondary | ICD-10-CM | POA: Diagnosis not present

## 2012-09-01 DIAGNOSIS — E119 Type 2 diabetes mellitus without complications: Secondary | ICD-10-CM | POA: Diagnosis not present

## 2012-09-01 DIAGNOSIS — M810 Age-related osteoporosis without current pathological fracture: Secondary | ICD-10-CM | POA: Diagnosis not present

## 2012-09-01 DIAGNOSIS — D509 Iron deficiency anemia, unspecified: Secondary | ICD-10-CM | POA: Diagnosis not present

## 2012-09-01 DIAGNOSIS — E559 Vitamin D deficiency, unspecified: Secondary | ICD-10-CM | POA: Diagnosis not present

## 2012-09-01 DIAGNOSIS — J449 Chronic obstructive pulmonary disease, unspecified: Secondary | ICD-10-CM | POA: Diagnosis not present

## 2012-09-01 DIAGNOSIS — E785 Hyperlipidemia, unspecified: Secondary | ICD-10-CM | POA: Diagnosis not present

## 2012-09-01 DIAGNOSIS — E538 Deficiency of other specified B group vitamins: Secondary | ICD-10-CM | POA: Diagnosis not present

## 2012-09-01 DIAGNOSIS — Z1331 Encounter for screening for depression: Secondary | ICD-10-CM | POA: Diagnosis not present

## 2012-09-01 DIAGNOSIS — K219 Gastro-esophageal reflux disease without esophagitis: Secondary | ICD-10-CM | POA: Diagnosis not present

## 2012-09-11 ENCOUNTER — Emergency Department (HOSPITAL_COMMUNITY): Payer: Medicare Other

## 2012-09-11 ENCOUNTER — Encounter (HOSPITAL_COMMUNITY): Payer: Self-pay | Admitting: *Deleted

## 2012-09-11 ENCOUNTER — Encounter (HOSPITAL_COMMUNITY)
Admission: EM | Disposition: A | Discharge status: Home Health | Payer: Self-pay | Source: Home / Self Care | Attending: Internal Medicine

## 2012-09-11 ENCOUNTER — Inpatient Hospital Stay (HOSPITAL_COMMUNITY)
Admission: EM | Admit: 2012-09-11 | Discharge: 2012-09-14 | DRG: 242 | Disposition: A | Discharge status: Home Health | Payer: Medicare Other | Attending: Internal Medicine | Admitting: Internal Medicine

## 2012-09-11 DIAGNOSIS — I469 Cardiac arrest, cause unspecified: Secondary | ICD-10-CM

## 2012-09-11 DIAGNOSIS — E7800 Pure hypercholesterolemia, unspecified: Secondary | ICD-10-CM | POA: Diagnosis present

## 2012-09-11 DIAGNOSIS — K449 Diaphragmatic hernia without obstruction or gangrene: Secondary | ICD-10-CM | POA: Diagnosis not present

## 2012-09-11 DIAGNOSIS — J9600 Acute respiratory failure, unspecified whether with hypoxia or hypercapnia: Secondary | ICD-10-CM

## 2012-09-11 DIAGNOSIS — J90 Pleural effusion, not elsewhere classified: Secondary | ICD-10-CM | POA: Diagnosis not present

## 2012-09-11 DIAGNOSIS — I10 Essential (primary) hypertension: Secondary | ICD-10-CM | POA: Diagnosis not present

## 2012-09-11 DIAGNOSIS — Z950 Presence of cardiac pacemaker: Secondary | ICD-10-CM

## 2012-09-11 DIAGNOSIS — R570 Cardiogenic shock: Secondary | ICD-10-CM | POA: Diagnosis not present

## 2012-09-11 DIAGNOSIS — Z794 Long term (current) use of insulin: Secondary | ICD-10-CM

## 2012-09-11 DIAGNOSIS — I442 Atrioventricular block, complete: Principal | ICD-10-CM

## 2012-09-11 DIAGNOSIS — K219 Gastro-esophageal reflux disease without esophagitis: Secondary | ICD-10-CM | POA: Diagnosis present

## 2012-09-11 DIAGNOSIS — J4489 Other specified chronic obstructive pulmonary disease: Secondary | ICD-10-CM | POA: Diagnosis present

## 2012-09-11 DIAGNOSIS — J449 Chronic obstructive pulmonary disease, unspecified: Secondary | ICD-10-CM

## 2012-09-11 DIAGNOSIS — E119 Type 2 diabetes mellitus without complications: Secondary | ICD-10-CM | POA: Diagnosis present

## 2012-09-11 DIAGNOSIS — M810 Age-related osteoporosis without current pathological fracture: Secondary | ICD-10-CM

## 2012-09-11 DIAGNOSIS — R55 Syncope and collapse: Secondary | ICD-10-CM | POA: Diagnosis not present

## 2012-09-11 DIAGNOSIS — R404 Transient alteration of awareness: Secondary | ICD-10-CM | POA: Diagnosis not present

## 2012-09-11 DIAGNOSIS — R5383 Other fatigue: Secondary | ICD-10-CM | POA: Diagnosis not present

## 2012-09-11 DIAGNOSIS — R5381 Other malaise: Secondary | ICD-10-CM | POA: Diagnosis not present

## 2012-09-11 DIAGNOSIS — R42 Dizziness and giddiness: Secondary | ICD-10-CM | POA: Diagnosis not present

## 2012-09-11 DIAGNOSIS — E785 Hyperlipidemia, unspecified: Secondary | ICD-10-CM

## 2012-09-11 DIAGNOSIS — J811 Chronic pulmonary edema: Secondary | ICD-10-CM | POA: Diagnosis not present

## 2012-09-11 HISTORY — DX: Chronic obstructive pulmonary disease, unspecified: J44.9

## 2012-09-11 HISTORY — PX: PERMANENT PACEMAKER INSERTION: SHX5480

## 2012-09-11 HISTORY — DX: Acute respiratory failure, unspecified whether with hypoxia or hypercapnia: J96.00

## 2012-09-11 HISTORY — DX: Cardiogenic shock: R57.0

## 2012-09-11 HISTORY — DX: Essential (primary) hypertension: I10

## 2012-09-11 HISTORY — DX: Unspecified asthma, uncomplicated: J45.909

## 2012-09-11 HISTORY — DX: Gastro-esophageal reflux disease without esophagitis: K21.9

## 2012-09-11 HISTORY — DX: Cardiac arrest, cause unspecified: I46.9

## 2012-09-11 LAB — URINE MICROSCOPIC-ADD ON

## 2012-09-11 LAB — BASIC METABOLIC PANEL
Calcium: 8.5 mg/dL (ref 8.4–10.5)
Creatinine, Ser: 1.14 mg/dL — ABNORMAL HIGH (ref 0.50–1.10)
GFR calc Af Amer: 57 mL/min — ABNORMAL LOW (ref >90)
GFR calc non Af Amer: 49 mL/min — ABNORMAL LOW (ref >90)
Glucose, Bld: 394 mg/dL — ABNORMAL HIGH (ref 70–99)
Potassium: 4.9 mEq/L (ref 3.5–5.1)
Sodium: 135 mEq/L (ref 135–145)
Sodium: 140 mEq/L (ref 135–145)

## 2012-09-11 LAB — URINALYSIS, ROUTINE W REFLEX MICROSCOPIC
Bilirubin Urine: NEGATIVE
Glucose, UA: >1000 mg/dL — AB
Hgb urine dipstick: NEGATIVE
Ketones, ur: NEGATIVE mg/dL
Leukocytes, UA: NEGATIVE
Nitrite: NEGATIVE
Protein, ur: NEGATIVE mg/dL
Specific Gravity, Urine: 1.018 (ref 1.005–1.030)
Urobilinogen, UA: 0.2 mg/dL (ref 0.0–1.0)
pH: 5.5 (ref 5.0–8.0)

## 2012-09-11 LAB — CBC
MCH: 27.5 pg (ref 26.0–34.0)
MCHC: 31.9 g/dL (ref 30.0–36.0)
MCV: 86.4 fL (ref 78.0–100.0)
Platelets: 210 10*3/uL (ref 150–400)
RBC: 4.03 MIL/uL (ref 3.87–5.11)
RDW: 15.6 % — ABNORMAL HIGH (ref 11.5–15.5)
WBC: 11.5 10*3/uL — ABNORMAL HIGH (ref 4.0–10.5)

## 2012-09-11 LAB — TROPONIN I
Troponin I: 1.37 ng/mL (ref <0.30)
Troponin I: 1.65 ng/mL (ref <0.30)
Troponin I: 1.52 ng/mL (ref <0.30)

## 2012-09-11 LAB — MRSA PCR SCREENING: MRSA by PCR: NEGATIVE

## 2012-09-11 LAB — GLUCOSE, CAPILLARY
Glucose-Capillary: 382 mg/dL — ABNORMAL HIGH (ref 70–99)
Glucose-Capillary: 426 mg/dL — ABNORMAL HIGH (ref 70–99)
Glucose-Capillary: 175 mg/dL — ABNORMAL HIGH (ref 70–99)

## 2012-09-11 LAB — PROTIME-INR
INR: 1.27 (ref 0.00–1.49)
Prothrombin Time: 15.6 seconds — ABNORMAL HIGH (ref 11.6–15.2)

## 2012-09-11 SURGERY — PERMANENT PACEMAKER INSERTION
Anesthesia: LOCAL

## 2012-09-11 MED ORDER — ACETAMINOPHEN 325 MG PO TABS
325.0000 mg | ORAL_TABLET | ORAL | Status: DC | PRN
  Administered 2012-09-11 – 2012-09-13 (×8): 650 mg via ORAL
  Filled 2012-09-11: qty 1
  Filled 2012-09-11 (×8): qty 2

## 2012-09-11 MED ORDER — INSULIN ASPART 100 UNIT/ML ~~LOC~~ SOLN
0.0000 [IU] | Freq: Three times a day (TID) | SUBCUTANEOUS | Status: DC
  Administered 2012-09-11: 15 [IU] via SUBCUTANEOUS
  Administered 2012-09-11: 3 [IU] via SUBCUTANEOUS
  Administered 2012-09-12: 5 [IU] via SUBCUTANEOUS
  Administered 2012-09-12: 3 [IU] via SUBCUTANEOUS
  Administered 2012-09-13 (×2): 5 [IU] via SUBCUTANEOUS
  Administered 2012-09-14: 8 [IU] via SUBCUTANEOUS
  Administered 2012-09-14: 5 [IU] via SUBCUTANEOUS

## 2012-09-11 MED ORDER — ONDANSETRON HCL 4 MG/2ML IJ SOLN: 4.0000 mg | Freq: Four times a day (QID) | INTRAMUSCULAR | Status: DC | PRN

## 2012-09-11 MED ORDER — CHLORHEXIDINE GLUCONATE 4 % EX LIQD
60.0000 mL | Freq: Once | CUTANEOUS | Status: DC
  Filled 2012-09-11: qty 60

## 2012-09-11 MED ORDER — SODIUM CHLORIDE 0.9 % IR SOLN
80.0000 mg | Status: DC
  Filled 2012-09-11: qty 2

## 2012-09-11 MED ORDER — SODIUM CHLORIDE 0.9 % IV SOLN: INTRAVENOUS | Status: DC

## 2012-09-11 MED ORDER — ETOMIDATE 2 MG/ML IV SOLN
INTRAVENOUS | Status: AC
  Filled 2012-09-11: qty 20

## 2012-09-11 MED ORDER — ETOMIDATE 2 MG/ML IV SOLN
20.0000 mg | Freq: Once | INTRAVENOUS | Status: AC
  Administered 2012-09-11: 20 mg via INTRAVENOUS

## 2012-09-11 MED ORDER — CEFAZOLIN SODIUM-DEXTROSE 2-3 GM-% IV SOLR
2.0000 g | Freq: Four times a day (QID) | INTRAVENOUS | Status: AC
  Administered 2012-09-11 – 2012-09-12 (×3): 2 g via INTRAVENOUS
  Filled 2012-09-11 (×4): qty 50

## 2012-09-11 MED ORDER — FENTANYL CITRATE 0.05 MG/ML IJ SOLN
25.0000 ug | Freq: Once | INTRAMUSCULAR | Status: AC
  Administered 2012-09-11: 06:00:00 via INTRAVENOUS

## 2012-09-11 MED ORDER — FENTANYL CITRATE 0.05 MG/ML IJ SOLN
12.5000 ug | INTRAMUSCULAR | Status: DC | PRN
  Administered 2012-09-11 – 2012-09-12 (×2): 12.5 ug via INTRAVENOUS
  Filled 2012-09-11 (×2): qty 2

## 2012-09-11 MED ORDER — DOPAMINE-DEXTROSE 3.2-5 MG/ML-% IV SOLN
5.0000 ug/kg/min | Freq: Once | INTRAVENOUS | Status: DC
  Administered 2012-09-11: 5 ug/kg/min via INTRAVENOUS

## 2012-09-11 MED ORDER — BIMATOPROST 0.01 % OP SOLN
1.0000 [drp] | Freq: Every day | OPHTHALMIC | Status: DC
  Administered 2012-09-11 – 2012-09-13 (×3): 1 [drp] via OPHTHALMIC
  Filled 2012-09-11: qty 2.5

## 2012-09-11 MED ORDER — ISOPROTERENOL HCL 0.2 MG/ML IJ SOLN
2.0000 ug/min | INTRAVENOUS | Status: DC
  Filled 2012-09-11: qty 5

## 2012-09-11 MED ORDER — ONDANSETRON HCL 4 MG/2ML IJ SOLN
INTRAMUSCULAR | Status: AC
  Filled 2012-09-11: qty 2

## 2012-09-11 MED ORDER — SUCCINYLCHOLINE CHLORIDE 20 MG/ML IJ SOLN
INTRAMUSCULAR | Status: AC
  Filled 2012-09-11: qty 1

## 2012-09-11 MED ORDER — EPINEPHRINE HCL 0.1 MG/ML IJ SOSY
PREFILLED_SYRINGE | INTRAMUSCULAR | Status: AC | PRN
  Administered 2012-09-11: 1 mg via INTRAVENOUS

## 2012-09-11 MED ORDER — MIDAZOLAM HCL 2 MG/2ML IJ SOLN
2.0000 mg | Freq: Once | INTRAMUSCULAR | Status: AC
  Administered 2012-09-11: 2 mg via INTRAVENOUS

## 2012-09-11 MED ORDER — BUDESONIDE-FORMOTEROL FUMARATE 160-4.5 MCG/ACT IN AERO
2.0000 | INHALATION_SPRAY | Freq: Two times a day (BID) | RESPIRATORY_TRACT | Status: DC
  Administered 2012-09-11 – 2012-09-14 (×6): 2 via RESPIRATORY_TRACT
  Filled 2012-09-11: qty 6

## 2012-09-11 MED ORDER — DOPAMINE-DEXTROSE 1.6-5 MG/ML-% IV SOLN: 5.0000 ug/kg/min | Freq: Once | INTRAVENOUS | Status: DC

## 2012-09-11 MED ORDER — MIDAZOLAM HCL 2 MG/2ML IJ SOLN: 1.0000 mg | INTRAMUSCULAR | Status: DC | PRN

## 2012-09-11 MED ORDER — IPRATROPIUM-ALBUTEROL 20-100 MCG/ACT IN AERS
6.0000 | INHALATION_SPRAY | Freq: Four times a day (QID) | RESPIRATORY_TRACT | Status: DC
  Filled 2012-09-11: qty 4

## 2012-09-11 MED ORDER — FENTANYL CITRATE 0.05 MG/ML IJ SOLN
25.0000 ug | Freq: Once | INTRAMUSCULAR | Status: AC
  Administered 2012-09-11: 25 ug via INTRAVENOUS

## 2012-09-11 MED ORDER — CEFAZOLIN SODIUM 1-5 GM-% IV SOLN
INTRAVENOUS | Status: AC
  Filled 2012-09-11: qty 100

## 2012-09-11 MED ORDER — PANTOPRAZOLE SODIUM 40 MG IV SOLR
40.0000 mg | INTRAVENOUS | Status: DC
  Administered 2012-09-11 – 2012-09-12 (×2): 40 mg via INTRAVENOUS
  Filled 2012-09-11 (×3): qty 40

## 2012-09-11 MED ORDER — FENTANYL CITRATE 0.05 MG/ML IJ SOLN
INTRAMUSCULAR | Status: AC
  Filled 2012-09-11: qty 2

## 2012-09-11 MED ORDER — MIDAZOLAM HCL 2 MG/2ML IJ SOLN
INTRAMUSCULAR | Status: AC
  Filled 2012-09-11: qty 4

## 2012-09-11 MED ORDER — ETOMIDATE 2 MG/ML IV SOLN: 0.3000 mg/kg | Freq: Once | INTRAVENOUS | Status: DC

## 2012-09-11 MED ORDER — HEPARIN SODIUM (PORCINE) 5000 UNIT/ML IJ SOLN
5000.0000 [IU] | Freq: Three times a day (TID) | INTRAMUSCULAR | Status: DC
  Administered 2012-09-11 – 2012-09-14 (×9): 5000 [IU] via SUBCUTANEOUS
  Filled 2012-09-11 (×13): qty 1

## 2012-09-11 MED ORDER — CEFAZOLIN SODIUM-DEXTROSE 2-3 GM-% IV SOLR
2.0000 g | INTRAVENOUS | Status: DC
  Filled 2012-09-11: qty 50

## 2012-09-11 MED ORDER — LIDOCAINE HCL (PF) 1 % IJ SOLN
INTRAMUSCULAR | Status: AC
  Filled 2012-09-11: qty 60

## 2012-09-11 MED ORDER — HEPARIN (PORCINE) IN NACL 2-0.9 UNIT/ML-% IJ SOLN
INTRAMUSCULAR | Status: AC
  Filled 2012-09-11: qty 500

## 2012-09-11 MED ORDER — ROCURONIUM BROMIDE 50 MG/5ML IV SOLN
INTRAVENOUS | Status: AC
  Filled 2012-09-11: qty 2

## 2012-09-11 MED ORDER — SUCCINYLCHOLINE CHLORIDE 20 MG/ML IJ SOLN
100.0000 mg | Freq: Once | INTRAMUSCULAR | Status: AC
  Administered 2012-09-11: 100 mg via INTRAVENOUS

## 2012-09-11 MED ORDER — MIDAZOLAM HCL 5 MG/5ML IJ SOLN
INTRAMUSCULAR | Status: AC
  Filled 2012-09-11: qty 5

## 2012-09-11 MED ORDER — MIDAZOLAM HCL 2 MG/2ML IJ SOLN
INTRAMUSCULAR | Status: AC
  Administered 2012-09-11: 2 mg via INTRAVENOUS
  Filled 2012-09-11: qty 2

## 2012-09-11 MED ORDER — GENTAMICIN SULFATE 40 MG/ML IJ SOLN
INTRAMUSCULAR | Status: AC
  Administered 2012-09-11: 09:00:00
  Filled 2012-09-11 (×3): qty 2

## 2012-09-11 MED ORDER — ALBUTEROL SULFATE (5 MG/ML) 0.5% IN NEBU: 2.5000 mg | INHALATION_SOLUTION | RESPIRATORY_TRACT | Status: DC | PRN

## 2012-09-11 MED ORDER — ONDANSETRON HCL 4 MG/2ML IJ SOLN
4.0000 mg | Freq: Once | INTRAMUSCULAR | Status: AC
  Administered 2012-09-11: 4 mg via INTRAVENOUS

## 2012-09-11 MED ORDER — LIDOCAINE HCL (CARDIAC) 20 MG/ML IV SOLN
INTRAVENOUS | Status: AC
  Filled 2012-09-11: qty 5

## 2012-09-11 MED ORDER — FENTANYL CITRATE 0.05 MG/ML IJ SOLN: 25.0000 ug | INTRAMUSCULAR | Status: DC | PRN

## 2012-09-11 MED FILL — Medication: Qty: 1 | Status: AC

## 2012-09-11 NOTE — Procedures (Signed)
Extubation Procedure Note  Patient Details:   Name: Wendy Velasquez DOB: February 25, 1926 MRN: [id number]   Patient extubated to [facility]. Vitals WNL. No complications or stridor noted. RT will continue to monitor.     Evaluation  O2 sats: stable throughout Complications: No apparent complications Patient did tolerate procedure well. Bilateral Breath Sounds: Clear Suctioning: Airway Yes  Jerl Mina M 09/11/2012, 11:04 AM

## 2012-09-11 NOTE — Consult Note (Signed)
PULMONARY  / CRITICAL CARE MEDICINE  Name: Wendy Velasquez MRN: [id number] DOB: [birth year]    ADMISSION DATE:  09/11/2012 CONSULTATION DATE:  09/11/12  REFERRING MD :  Dr. Rennis Golden PRIMARY SERVICE: Cardiology  CHIEF COMPLAINT:  CHB / Acute Resp Failure  BRIEF PATIENT DESCRIPTION: 79 y/o F who presented to [facility] ER on 7/3 with c/o nausea & near syncope.  Found to have CHB with rate of 12-20.  CPR initiated in ER with brief loss of pulse.  Intubated for during CPR and taken emergently to cath lab.  PCCM consulted for airway management.   SIGNIFICANT EVENTS / STUDIES:  7/03 - Admit to ICU, post pacemaker insertion for CHB, cardiac arrest  LINES / TUBES: OETT 7/3>>>   CULTURES:   ANTIBIOTICS: Ancef 7/3>>>  HISTORY OF PRESENT ILLNESS:  79 y/o F with PMH of HTN, DM, HLD, GERD, COPD with recent syncopal episodes with extensive negative work up.  Patient woke in early am of 7/3 to go to restroom and passed out.  Upon EMS arrival she was noted to be bradycardic with a HR of 12-20.  Given atropine in route with no response.  Pt lost consciousness in route, upon awakening, she vomited.  In ER, external pacing was attempted.  She was significantly hypotensive with poor perfusion. She lost pulses in ER requiring brief ACLS.  Dr. Ladona Ridgel intubated and Dr. Rennis Golden inserted a femoral transvenous pacing wire.  Pacing was achieved and BP improved.  She was emergently transferred to the cath lab for pacemaker placement.  Pacemaker inserted in cath lab with post placement cxr pending.    PAST MEDICAL HISTORY :  Past Medical History  Diagnosis Date  . Hypertension   . COPD (chronic obstructive pulmonary disease)   . Diabetes mellitus without complication   . Hypercholesterolemia   . GERD (gastroesophageal reflux disease)    Past Surgical History  Procedure Laterality Date  . Interstim implant placement     Prior to Admission medications   Medication Sig Start Date End Date Taking? Authorizing Provider   albuterol-ipratropium (COMBIVENT) 18-103 MCG/ACT inhaler Inhale 2 puffs into the lungs every 6 (six) hours as needed for wheezing.   Yes Historical Provider, MD  ASCORBIC ACID PO Take 1 tablet by mouth daily.   Yes Historical Provider, MD  bimatoprost (LUMIGAN) 0.01 % SOLN Apply 1 drop to eye at bedtime.   Yes Historical Provider, MD  bisoprolol-hydrochlorothiazide (ZIAC) 5-6.25 MG per tablet Take 1 tablet by mouth 2 (two) times daily.    Yes Historical Provider, MD  buPROPion (WELLBUTRIN) 100 MG tablet Take 100 mg by mouth daily.   Yes Historical Provider, MD  cyanocobalamin (,VITAMIN B-12,) 1000 MCG/ML injection Inject 1,000 mcg into the muscle once.   Yes Historical Provider, MD  esomeprazole (NEXIUM) 40 MG capsule Take 40 mg by mouth daily before breakfast.   Yes Historical Provider, MD  Fluticasone Furoate-Vilanterol (BREO ELLIPTA) 100-25 MCG/INH AEPB Inhale 1 puff into the lungs daily.   Yes Historical Provider, MD  gabapentin (NEURONTIN) 100 MG capsule Take 200 mg by mouth at bedtime.   Yes Historical Provider, MD  insulin glargine (LANTUS) 100 UNIT/ML injection Inject 10-12 Units into the skin at bedtime.   Yes Historical Provider, MD  IRON PO Take 1 tablet by mouth 2 (two) times daily.   Yes Historical Provider, MD  metFORMIN (GLUCOPHAGE) 1000 MG tablet Take 1,000 mg by mouth 2 (two) times daily with a meal.   Yes Historical Provider, MD  Nebulizer MISC  Inhale 1 ampule into the lungs every 6 (six) hours as needed (wheezing or shortness of breath).   Yes Historical Provider, MD  sertraline (ZOLOFT) 50 MG tablet Take 50 mg by mouth 2 (two) times daily.   Yes Historical Provider, MD  simvastatin (ZOCOR) 10 MG tablet Take 10 mg by mouth at bedtime.   Yes Historical Provider, MD  valsartan (DIOVAN) 320 MG tablet Take 320 mg by mouth daily.   Yes Historical Provider, MD   No Known Allergies  FAMILY HISTORY:  No family history on file.  SOCIAL HISTORY:  has no tobacco, alcohol, and drug  history on file.  REVIEW OF SYSTEMS:  Pt is intubated / sedated.  Unable to perform ROS.   SUBJECTIVE:   VITAL SIGNS: Pulse Rate:  [34-80] 80 (07/03 0746) Resp:  [12-24] 16 (07/03 0749) BP: (63-214)/(32-124) 84/42 mmHg (07/03 0749) SpO2:  [93 %-100 %] 100 % (07/03 0749) FiO2 (%):  [100 %] 100 % (07/03 0749) Weight:  [143 lb 4.8 oz (65 kg)] 143 lb 4.8 oz (65 kg) (07/03 0900)  HEMODYNAMICS:    VENTILATOR SETTINGS: Vent Mode:  [-] PRVC FiO2 (%):  [100 %] 100 % Set Rate:  [14 bmp] 14 bmp Vt Set:  [420 mL] 420 mL PEEP:  [5 cmH20] 5 cmH20 Plateau Pressure:  [16 cmH20] 16 cmH20  INTAKE / OUTPUT: Intake/Output   None     PHYSICAL EXAMINATION: General:  wdwn adult female in NAD  Neuro:  Sedate, opens eyes to name HEENT:  OETT, mm pink/moist, no jvd Cardiovascular:  s1s2 rrr, no m/r/g Lungs:  resp's even/non-labored, lungs bilaterally clear Abdomen:  Flat/soft, bsx4 active Musculoskeletal:  No acute deformities Skin:  Cool / dry, no edema  LABS:  Recent Labs Lab 09/11/12 0636  HGB 11.1*  WBC 11.5*  PLT 210  NA 135  K 4.7  CL 97  CO2 27  GLUCOSE 412*  BUN 30*  CREATININE 1.14*  CALCIUM 8.9  INR 1.27    Recent Labs Lab 09/11/12 0637  GLUCAP 382*    CXR: 7/3 post pacemaker >>>  ASSESSMENT / PLAN:  PULMONARY A: Acute Respiratory Failure - in setting of cardiac arrest / CHB. resolved COPD P:   -Symbicort ordered as substitute for Breo PRN albuterol -Extubate. see below  CARDIOVASCULAR A:  Complete Heart Block - s/p pacemaker insertion 7/3 Cardiac Arrest - in setting of CHB HTN HLD P:  -Recommendations per Cardiology -wean dopamine to off for MAP >65 -assess TSH -hold home BP regimen  RENAL A:   At Risk ATN - in setting of poor perfusion / cardiac arrest P:   -gentle hydration -repeat BMP 1400 and in am  GASTROINTESTINAL A:   NPO SUP P:   -PPI -hold OGT, if not extubated in am 7/4, consider feeding  HEMATOLOGIC A:    Anemia DVT Proph P:  -monitor H/H, no evidence of acute bleeding -consider anemia w/u as outpatient -Heparin for DVT proph  INFECTIOUS A:   S/P Pacemaker Insertion  P:   -monitor site  ENDOCRINE A:   DM2    P:   -SSI -hold home lantus, metformin  NEUROLOGIC A:   Acute Encephalopathy - in setting of cardiac arrest / CHB / poor perfusion state. Appears resolved P:   -supportive care   Canary Brim, NP-C [facility] Pgr: [phone] or [phone]    I have personally obtained a history, examined the patient, evaluated laboratory and imaging results, formulated the assessment and  plan and placed orders.   She is now fully awake and tolerates PS 5 cm. Able to triple her spont VT on command. I have placed order to extubate. If she does well between now and tomorrow, PCCM will s/o.    Billy Fischer, MD ; [facility] [phone].  After 5:30 PM or weekends, call [phone]    09/11/2012, 9:27 AM

## 2012-09-11 NOTE — Care Management Note (Signed)
    Page 1 of 1   09/11/2012     10:10:44 AM   CARE MANAGEMENT NOTE 09/11/2012  Patient:  Wendy, Velasquez   Account Number:  [id number]  Date Initiated:  09/11/2012  Documentation initiated by:  Junius Creamer  Subjective/Objective Assessment:   adm w bradycardia, vent     Action/Plan:   lives w husband   Anticipated DC Date:     Anticipated DC Plan:        DC Planning Services  CM consult      Choice offered to / List presented to:             Status of service:   Medicare Important Message given?   (If response is "NO", the following Medicare IM given date fields will be blank) Date Medicare IM given:   Date Additional Medicare IM given:    Discharge Disposition:    Per UR Regulation:  Reviewed for med. necessity/level of care/duration of stay  If discussed at Long Length of Stay Meetings, dates discussed:    Comments:

## 2012-09-11 NOTE — H&P (View-Only) (Signed)
ELECTROPHYSIOLOGY CONSULT NOTE    Patient ID: Wendy Velasquez MRN: [id number], DOB/AGE: 1925-09-12 79 y.o.  Admit date: 09/11/2012 Date of Consult: 09-11-2012  Primary Physician: Guerry Bruin, MD Primary Cardiologist: Rennis Golden- new this admission  Reason for Consultation: complete heart block  HPI:  Wendy Velasquez is a 79 year old female with a past medical history of hypertension, COPD, and diabetes.  She has had intermittent fatigue and has worn an event monitor that wasn't revealing.  She was in her usual state of health until last night when she became nauseated.  She went to the restroom and passed out.  EMS was called and she was found to be in complete heart block.  She was brought to [facility] for further evaluation. While in the ER she required external pacing, coded, and underwent intubation and insertion of a temp wire.  EP has been asked to evaluate for pacemaker placement  ROS is unable to be obtained because of intubation.   Past Medical History  Diagnosis Date  . Hypertension   . COPD (chronic obstructive pulmonary disease)   . Diabetes mellitus without complication   . Hypercholesterolemia   . GERD (gastroesophageal reflux disease)      Surgical History:  Past Surgical History  Procedure Laterality Date  . Interstim implant placement        (Not in a hospital admission)  Inpatient Medications:  . etomidate      . lidocaine (cardiac) 100 mg/71ml      . rocuronium      . succinylcholine        Allergies: No Known Allergies  History   Social History  . Marital Status: Married    Spouse Name: N/A    Number of Children: N/A  . Years of Education: N/A   Occupational History  . Not on file.   Social History Main Topics  . Smoking status: Not on file  . Smokeless tobacco: Not on file  . Alcohol Use: Not on file  . Drug Use: Not on file  . Sexually Active: Not on file   Other Topics Concern  . Not on file   Social History Narrative  . No  narrative on file     No family history on file.    Labs:   Lab Results  Component Value Date   WBC 11.5* 09/11/2012   HGB 11.1* 09/11/2012   HCT 34.8* 09/11/2012   MCV 86.4 09/11/2012   PLT 210 09/11/2012   No results found for this basename: NA, K, CL, CO2, BUN, CREATININE, CALCIUM, LABALBU, PROT, BILITOT, ALKPHOS, ALT, AST, GLUCOSE,  in the last 168 hours   Radiology/Studies: Dg Chest Portable 1 View  09/11/2012   *RADIOLOGY REPORT*  Clinical Data: Dizziness and weakness  PORTABLE CHEST - 1 VIEW  Comparison: 03/21/2005  Findings: Artifact overlies the chest.  Heart size is normal. Hiatal hernia is noted.  There is atherosclerosis of the aorta. There may be pulmonary venous hypertension but there is no frank edema.  No infiltrate or collapse.  No effusions.  No acute bony findings.  IMPRESSION: Possible pulmonary venous hypertension.  No frank edema.  No focal pulmonary abnormality.   Original Report Authenticated By: Paulina Fusi, M.D.   Physical exam  Intubated and sedated elderly woman HEENT: ETT tube in place Neck:  8 cm JVD, no thyromegally Lungs:  Clear with no wheezes HEART:  Regular rate rhythm, no murmurs, no rubs, no clicks Abd:  soft, positive bowel sounds, no  organomegally, no rebound, no guarding Ext:  2 plus pulses, no edema, no cyanosis, no clubbing Skin:  No rashes no nodules Neuro:  CN II through XII intact, motor grossly intact   UXL:[id number] heart block  TELEMETRY: externally paced at 60  A/P 1. CHB with no escape 2. HTN 3. H/o syncope Rec: This is a very dire situation. We have placed a temporary PM via the right femoral vein. It is barely in. I am concerned about dislodgement. I have offered the family insertion of a PPM this morning and have discussed the risks and benefits and they wish to proceed.  Wendy Velasquez.D.

## 2012-09-11 NOTE — Interval H&P Note (Signed)
History and Physical Interval Note:  09/11/2012 9:18 AM  Wendy Velasquez  has presented today for surgery, with the diagnosis of 3rd degree heart block  The various methods of treatment have been discussed with the patient and family. After consideration of risks, benefits and other options for treatment, the patient has consented to  Procedure(s): PERMANENT PACEMAKER INSERTION (N/A) as a surgical intervention .  The patient's history has been reviewed, patient examined, no change in status, stable for surgery.  I have reviewed the patient's chart and labs.  Questions were answered to the patient's satisfaction.     Lewayne Bunting

## 2012-09-11 NOTE — Code Documentation (Signed)
Pulses lost and compressions started. Cardiology at the bedside.

## 2012-09-11 NOTE — CV Procedure (Signed)
DDD PM insertion via the left subclavian vein without immediate complication. Q#[id number].

## 2012-09-11 NOTE — CV Procedure (Addendum)
  TEMPORARY TRANSVENOUS PACEMAKER NOTE  SHAKENNA HERRERO [id number] 09/11/2012  Surgeon: Chrystie Nose  Procedure(s): Emergency temporary transvenous pacemaker placement  Indications: complete heart block (acquired)  Consent:  The risks, benefits, complications, treatment options, and expected outcomes were discussed not discussed due to the emergency of the situation. Consent was not given due to emergent medical necessity, with the patient unresponsive in cardiac arrest.  Procedure Details:  After procedural and radiation safety time-outs were performed, the patient was prepped and draped in the usual sterile fashion. The right femoral vein was identified and the overlying skin was cleansed with chlorhexidine, prepped and draped as sterilly as possible. The vessel was accessed with a straight needle and a wire using the Seldinger technique. An introducer sheath was then advanced over the guidewire into position. The sheath was flushed and the temporary pacemaker wire was then advanced through the sheath until electrical capture was noted. The position of the wire depth was noted. Pacemaker settings were adjusted to allow the minimal pacing necessary to capture the ventricle. The sheath was then sutured into place and the pacemaker wire was secured.   Estimated Blood Loss:  Minimal       Complications:  None; patient tolerated the procedure well.  CXR: Post-operative chest x-ray shows adequate position of the pacemaker wire which is visualized extending into the apex of the right ventricle.  No pneumothorax was noted.         Disposition/Condition: ICU - intubated and hemodynamically stable.         Chrystie Nose, MD, [facility] Attending Cardiologist [facility]  09/11/2012,8:09 AM

## 2012-09-11 NOTE — ED Notes (Signed)
MD at bedside. 

## 2012-09-11 NOTE — ED Notes (Signed)
Temporary pacemaker placed to the right groin at this time bu Dr. Rennis Golden, Pt being paced with external pacing box.

## 2012-09-11 NOTE — ED Notes (Signed)
Family at bedside. 

## 2012-09-11 NOTE — Consult Note (Signed)
  ELECTROPHYSIOLOGY CONSULT NOTE    Patient ID: Wendy Velasquez MRN: [id number], DOB/AGE: 07/21/1925 79 y.o.  Admit date: 09/11/2012 Date of Consult: 09-11-2012  Primary Physician: Wendy Tisovec, MD Primary Cardiologist: Wendy Velasquez- new this admission  Reason for Consultation: complete heart block  HPI:  Wendy Velasquez is a 79 year old female with a past medical history of hypertension, COPD, and diabetes.  She has had intermittent fatigue and has worn an event monitor that wasn't revealing.  She was in her usual state of health until last night when she became nauseated.  She went to the restroom and passed out.  EMS was called and she was found to be in complete heart block.  She was brought to [facility] for further evaluation. While in the ER she required external pacing, coded, and underwent intubation and insertion of a temp wire.  EP has been asked to evaluate for pacemaker placement  ROS is unable to be obtained because of intubation.   Past Medical History  Diagnosis Date  . Hypertension   . COPD (chronic obstructive pulmonary disease)   . Diabetes mellitus without complication   . Hypercholesterolemia   . GERD (gastroesophageal reflux disease)      Surgical History:  Past Surgical History  Procedure Laterality Date  . Interstim implant placement        (Not in a hospital admission)  Inpatient Medications:  . etomidate      . lidocaine (cardiac) 100 mg/5ml      . rocuronium      . succinylcholine        Allergies: No Known Allergies  History   Social History  . Marital Status: Married    Spouse Name: N/A    Number of Children: N/A  . Years of Education: N/A   Occupational History  . Not on file.   Social History Main Topics  . Smoking status: Not on file  . Smokeless tobacco: Not on file  . Alcohol Use: Not on file  . Drug Use: Not on file  . Sexually Active: Not on file   Other Topics Concern  . Not on file   Social History Narrative  . No  narrative on file     No family history on file.    Labs:   Lab Results  Component Value Date   WBC 11.5* 09/11/2012   HGB 11.1* 09/11/2012   HCT 34.8* 09/11/2012   MCV 86.4 09/11/2012   PLT 210 09/11/2012   No results found for this basename: NA, K, CL, CO2, BUN, CREATININE, CALCIUM, LABALBU, PROT, BILITOT, ALKPHOS, ALT, AST, GLUCOSE,  in the last 168 hours   Radiology/Studies: Dg Chest Portable 1 View  09/11/2012   *RADIOLOGY REPORT*  Clinical Data: Dizziness and weakness  PORTABLE CHEST - 1 VIEW  Comparison: 03/21/2005  Findings: Artifact overlies the chest.  Heart size is normal. Hiatal hernia is noted.  There is atherosclerosis of the aorta. There may be pulmonary venous hypertension but there is no frank edema.  No infiltrate or collapse.  No effusions.  No acute bony findings.  IMPRESSION: Possible pulmonary venous hypertension.  No frank edema.  No focal pulmonary abnormality.   Original Report Authenticated By: Mark Shogry, M.D.   Physical exam  Intubated and sedated elderly woman HEENT: ETT tube in place Neck:  8 cm JVD, no thyromegally Lungs:  Clear with no wheezes HEART:  Regular rate rhythm, no murmurs, no rubs, no clicks Abd:  soft, positive bowel sounds, no   organomegally, no rebound, no guarding Ext:  2 plus pulses, no edema, no cyanosis, no clubbing Skin:  No rashes no nodules Neuro:  CN II through XII intact, motor grossly intact   EKG:complete heart block  TELEMETRY: externally paced at 60  A/P 1. CHB with no escape 2. HTN 3. H/o syncope Rec: This is a very dire situation. We have placed a temporary PM via the right femoral vein. It is barely in. I am concerned about dislodgement. I have offered the family insertion of a PPM this morning and have discussed the risks and benefits and they wish to proceed.  Wendy Velasquez,M.D.    

## 2012-09-11 NOTE — H&P (Addendum)
ADMISSION HISTORY & PHYSICAL   Chief Complaint:  Complete heart block, cardiac arrest  Cardiologist: new Rennis Golden)  Primary Care Physician: No primary provider on file.  HPI:  This is a 79 y.o. female with a past medical history significant for COPD, HTN, GERD, HPL and weakness. She apparently got up to go to the bathroom today and passed out.  When EMS arrived she was noted to be extremely bradycardic. 12-lead EKG showed complete heart block. She was administered atropine in route and loss consciousness and when she awakened she vomited. External pacing was attempted. Upon arrival to the trauma bay she was noted to be in complete heart block with a slow ventricular response. Ventricular rate was less than 20. She was hypotensive, ashen and poorly perfusing. Mental status was altered. She had external pacing pads applied with apparent capture on high output of a rate of 60.  When I arrived in the emergency room, family was at the bedside and the patient was noncommunicative on a nonrebreather. Shortly thereafter her pupils became fixed and dilated and she lost muscle tone. Pulses were checked and barely palpable, and loss of mechanical capture was confirmed.  CPR was initiated and 1 mg of epinephrine was administered IV.  I had notified Dr. Ladona Ridgel with cardiac electrophysiology and he arrived around this time. We immediately called cardiac arrest and started resuscitation. Dr. Ladona Ridgel secured the airway with intubation and I placed a temporary transvenous pacing from the right femoral vein (see separate procedure note for details).  We were able to achieve capture fairly quickly with the temporary pacer. After temporary pacing she stabilized with an improved blood pressure of around 100 systolic, however epinephrine was starting to wear off and her blood pressure had dropped into the 70s to 80s systolic. We had a discussion with the family regarding placement of a permanent pacemaker and they are  agreeable to this. Dopamine was initiated, the patient will be taken to the cardiac catheterization lab urgently for a pacemaker.  PMHx:  Past Medical History  Diagnosis Date  . Hypertension   . COPD (chronic obstructive pulmonary disease)   . Diabetes mellitus without complication   . Hypercholesterolemia   . GERD (gastroesophageal reflux disease)     Past Surgical History  Procedure Laterality Date  . Interstim implant placement      FAMHx:  No family history on file. Unable to obtain from the patient.  SOCHx:   has no tobacco, alcohol, and drug history on file. Unable to obtain from the patient.  ALLERGIES:  No Known Allergies  ROS: Review of systems not obtained due to patient factors.  HOME MEDS: Medications Prior to Admission  Medication Sig Dispense Refill  . albuterol-ipratropium (COMBIVENT) 18-103 MCG/ACT inhaler Inhale 2 puffs into the lungs every 6 (six) hours as needed for wheezing.      . ASCORBIC ACID PO Take 1 tablet by mouth daily.      . bimatoprost (LUMIGAN) 0.01 % SOLN Apply 1 drop to eye at bedtime.      . bisoprolol-hydrochlorothiazide (ZIAC) 5-6.25 MG per tablet Take 1 tablet by mouth 2 (two) times daily.       Marland Kitchen buPROPion (WELLBUTRIN) 100 MG tablet Take 100 mg by mouth daily.      . cyanocobalamin (,VITAMIN B-12,) 1000 MCG/ML injection Inject 1,000 mcg into the muscle once.      Marland Kitchen esomeprazole (NEXIUM) 40 MG capsule Take 40 mg by mouth daily before breakfast.      . Fluticasone  Furoate-Vilanterol (BREO ELLIPTA) 100-25 MCG/INH AEPB Inhale 1 puff into the lungs daily.      Marland Kitchen gabapentin (NEURONTIN) 100 MG capsule Take 200 mg by mouth at bedtime.      . insulin glargine (LANTUS) 100 UNIT/ML injection Inject 10-12 Units into the skin at bedtime.      . IRON PO Take 1 tablet by mouth 2 (two) times daily.      . metFORMIN (GLUCOPHAGE) 1000 MG tablet Take 1,000 mg by mouth 2 (two) times daily with a meal.      . Nebulizer MISC Inhale 1 ampule into the lungs  every 6 (six) hours as needed (wheezing or shortness of breath).      . sertraline (ZOLOFT) 50 MG tablet Take 50 mg by mouth 2 (two) times daily.      . simvastatin (ZOCOR) 10 MG tablet Take 10 mg by mouth at bedtime.      . valsartan (DIOVAN) 320 MG tablet Take 320 mg by mouth daily.        LABS/IMAGING: Results for orders placed during the hospital encounter of 09/11/12 (from the past 48 hour(s))  CBC     Status: Abnormal   Collection Time    09/11/12  6:36 AM      Result Value Range   WBC 11.5 (*) 4.0 - 10.5 K/uL   RBC 4.03  3.87 - 5.11 MIL/uL   Hemoglobin 11.1 (*) 12.0 - 15.0 g/dL   HCT 16.1 (*) 09.6 - 04.5 %   MCV 86.4  78.0 - 100.0 fL   MCH 27.5  26.0 - 34.0 pg   MCHC 31.9  30.0 - 36.0 g/dL   RDW 40.9 (*) 81.1 - 91.4 %   Platelets 210  150 - 400 K/uL  BASIC METABOLIC PANEL     Status: Abnormal   Collection Time    09/11/12  6:36 AM      Result Value Range   Sodium 135  135 - 145 mEq/L   Potassium 4.7  3.5 - 5.1 mEq/L   Chloride 97  96 - 112 mEq/L   CO2 27  19 - 32 mEq/L   Glucose, Bld 412 (*) 70 - 99 mg/dL   BUN 30 (*) 6 - 23 mg/dL   Creatinine, Ser 7.82 (*) 0.50 - 1.10 mg/dL   Calcium 8.9  8.4 - 95.6 mg/dL   GFR calc non Af Amer 43 (*) >90 mL/min   GFR calc Af Amer 50 (*) >90 mL/min   Comment:            The eGFR has been calculated     using the CKD EPI equation.     This calculation has not been     validated in all clinical     situations.     eGFR's persistently     <90 mL/min signify     possible Chronic Kidney Disease.  PROTIME-INR     Status: Abnormal   Collection Time    09/11/12  6:36 AM      Result Value Range   Prothrombin Time 15.6 (*) 11.6 - 15.2 seconds   INR 1.27  0.00 - 1.49  GLUCOSE, CAPILLARY     Status: Abnormal   Collection Time    09/11/12  6:37 AM      Result Value Range   Glucose-Capillary 382 (*) 70 - 99 mg/dL   Comment 1 Documented in Chart     Comment 2 Notify RN     Comment  3 Confirm Test in Lab    POCT I-STAT TROPONIN I      Status: None   Collection Time    09/11/12  6:38 AM      Result Value Range   Troponin i, poc 0.02  0.00 - 0.08 ng/mL   Comment 3            Comment: Due to the release kinetics of cTnI,     a negative result within the first hours     of the onset of symptoms does not rule out     myocardial infarction with certainty.     If myocardial infarction is still suspected,     repeat the test at appropriate intervals.   Dg Chest Portable 1 View  09/11/2012   *RADIOLOGY REPORT*  Clinical Data: CPR.  Endotracheal placement.  Temporary pacemaker placement.  PORTABLE CHEST - 1 VIEW  Comparison: Earlier same day  Findings: Endotracheal tube has its tip 3.5 cm above the carina. Venous pacemaker introduced from a femoral approach has its tip in the expected location of the right ventricle.  There is developing interstitial and alveolar edema.  No pulmonary collapse.  IMPRESSION: Endotracheal tube and transvenous pacemaker grossly well positioned.  Developing pulmonary edema.   Original Report Authenticated By: Paulina Fusi, M.D.   Dg Chest Portable 1 View  09/11/2012   *RADIOLOGY REPORT*  Clinical Data: Dizziness and weakness  PORTABLE CHEST - 1 VIEW  Comparison: 03/21/2005  Findings: Artifact overlies the chest.  Heart size is normal. Hiatal hernia is noted.  There is atherosclerosis of the aorta. There may be pulmonary venous hypertension but there is no frank edema.  No infiltrate or collapse.  No effusions.  No acute bony findings.  IMPRESSION: Possible pulmonary venous hypertension.  No frank edema.  No focal pulmonary abnormality.   Original Report Authenticated By: Paulina Fusi, M.D.    Theodoro KosCeasar Mons Vitals:   09/11/12 0749  BP: 84/42  Pulse:   Resp: 16    EXAM: General appearance: appears stated age, severe distress and pale, non-responsive Neck: no JVD Lungs: clear to auscultation bilaterally Heart: bradycardic, distant heart sounds Abdomen: soft, non-tender; bowel sounds normal; no masses,   no organomegaly Extremities: edema none Pulses: weak bilateral femoral pulses Skin: pale, cool, ashen Neurologic: Mental status: obtunted, non-responsive  IMPRESSION: Principal Problem:   Complete heart block Active Problems:   Cardiac arrest   HTN (hypertension)   Hyperlipidemia   COPD (chronic obstructive pulmonary disease)   Osteoporosis   GERD (gastroesophageal reflux disease)   Acute respiratory failure   Cardiogenic shock   PLAN: 1. She was stabilized in the emergency room and will be taken urgently to the cardiac catheterization lab for a permanent pacemaker. To be admitted to the CCU for management of cardiogenic shock and post resuscitation.   CRITICAL CARE:  The patient is critically ill with multi-organ system failure and requires high complexity decision making for assessment and support, frequent evaluation and titration of therapies, application of advanced monitoring technologies and extensive interpretation of multiple databases.  Critical care time spent directly with the patient: 90 minutes  Chrystie Nose, MD, [facility] Attending Cardiologist [facility]  Shauntea Lok C 09/11/2012, 8:07 AM

## 2012-09-11 NOTE — Op Note (Signed)
Wendy Velasquez, Wendy Velasquez              ACCOUNT NO.:  [id number]  MEDICAL RECORD NO.:  [id number]  LOCATION:  [facility]                         FACILITY:  [facility]  PHYSICIAN:  Doylene Canning. Ladona Ridgel, MD    DATE OF BIRTH:  January 04, 1926  DATE OF PROCEDURE:  09/11/2012 DATE OF DISCHARGE:                              OPERATIVE REPORT   PROCEDURE PERFORMED:  Insertion of a dual-chamber pacemaker.  INDICATION:  Symptomatic complete heart block.  INTRODUCTION:  The patient is an 79 year old woman who presented to the emergency department with a heart rate of 10 beats per minute.  She was externally paced.  She became pulseless and was intubated and insertion of a temporary transvenous pacemaker was placed by way of the right femoral vein blindly.  This allowed her to be stabilized enough along with a positive inotropes to present to the cath lab for emergent pacemaker insertion.  PROCEDURE:  After informed was obtained, the patient was taken to the diagnostic EP lab in the fasting state.  It should be noted that the consent was actually obtained by the patient's daughters who I discussed the risks and benefits of the procedure with extensively.  A 30 mL of lidocaine was infiltrated into the left infraclavicular region after the patient was prepped and draped in the usual manner.  A 5-cm incision was carried out over this region.  Electrocautery was utilized to dissect down to the fascial plane.  The left subclavian vein was punctured x2 and the St. Jude model [phone] cm active fixation pacing lead, serial #[id number] was advanced into the right ventricle.  The St. Jude model [id number] cm active fixation pacing lead serial #[id number] was advanced to the right atrium.  Mapping was first carried out in the right ventricle.  At the final site, there were no R-waves intrinsically, but the paced R-waves were 8.  The pacing threshold was 0.7 V at 0.5 milliseconds and the pacing impedance was 600 ohms.  There was a  large injury current.  With these satisfactory parameters, attention was then turned to the placement of the atrial lead was placed in the anterolateral portion of the right atrium with P-waves measured 3 mV. The pacing impedance 500 ohms and the threshold 1.3 V at 0.5 milliseconds.  A 10 V pacing did not stimulate the diaphragm.  With the leads in satisfactory position and actively fixed, they were secured to the subpectoralis fascia with a figure-of-eight silk suture.  The sewing sleeve was secured with silk suture.  Electrocautery was utilized to make subcutaneous pocket.  Antibiotic irrigation was utilized to irrigate the pocket and electrocautery was utilized to assure hemostasis.  The St. Jude Assurity dual-chamber pacemaker, serial [id number] was connected to the atrial and RV leads and placed back in the subcutaneous pocket.  The generator was secured with silk suture.  The pocket was irrigated with antibiotic irrigation and the incision was closed with 2-0 and 3-0 Vicryl.  Benzoin and Steri-Strips were painted on the skin, pressure dressing was applied, and the patient was returned to her room in satisfactory condition.  COMPLICATIONS:  There were no immediate procedure complications.  RESULTS:  This demonstrate successful implantation of a St. Jude dual-  chamber pacemaker in a patient with symptomatic complete heart block at no escape.     Doylene Canning. Ladona Ridgel, MD     GWT/MEDQ  D:  09/11/2012  T:  09/11/2012  Job:  [id number]

## 2012-09-11 NOTE — ED Provider Notes (Signed)
History    CSN: [id number] Arrival date & time 09/11/12  0602  First MD Initiated Contact with Patient 09/11/12 [phone]     Chief Complaint  Patient presents with  . Dizziness  . Weakness   (Consider location/radiation/quality/duration/timing/severity/associated sxs/prior Treatment) Patient is a 79 y.o. female presenting with weakness. The history is provided by the patient, the EMS personnel and a relative.  Weakness Pertinent negatives include no chest pain, no abdominal pain and no shortness of breath.   patient had to use the bathroom. Noted that she was very lightheaded and almost passed out a couple times. Family members were called she lives with her husband in the assisted care living. EMS called. Patient noted a PE and a significant bradycardia. CBG at the house was 272. EMS in route administered atropine and they noted that the rhythm strips seem to be consistent with complete heart block. Patient had a period of loss of consciousness while in route when she came to she vomited. EMS did not pace. Patient denied any chest pain or shortness of breath. Initial blood pressure here is 165/118. Patient with a history 2 months ago of some syncopal problems with admission an extensive workup with negative findings. Patient has a history of hypertension and diabetes. No cardiac history or stroke history. Past Medical History  Diagnosis Date  . Hypertension   . COPD (chronic obstructive pulmonary disease)   . Diabetes mellitus without complication   . Hypercholesterolemia   . GERD (gastroesophageal reflux disease)    Past Surgical History  Procedure Laterality Date  . Interstim implant placement     No family history on file. History  Substance Use Topics  . Smoking status: Not on file  . Smokeless tobacco: Not on file  . Alcohol Use: Not on file   OB History   Grav Para Term Preterm Abortions TAB SAB Ect Mult Living                 Review of Systems  Constitutional: Positive  for fatigue.  HENT: Negative for congestion.   Eyes: Negative for visual disturbance.  Respiratory: Negative for shortness of breath.   Cardiovascular: Negative for chest pain.  Gastrointestinal: Positive for nausea and vomiting. Negative for abdominal pain.  Genitourinary: Negative for dysuria.  Musculoskeletal: Negative for back pain.  Skin: Negative for rash.  Neurological: Positive for syncope, weakness and light-headedness.  Hematological: Does not bruise/bleed easily.  Psychiatric/Behavioral: Negative for confusion.    Allergies  Review of patient's allergies indicates no known allergies.  Home Medications   Current Outpatient Rx  Name  Route  Sig  Dispense  Refill  . albuterol-ipratropium (COMBIVENT) 18-103 MCG/ACT inhaler   Inhalation   Inhale 2 puffs into the lungs every 6 (six) hours as needed for wheezing.         . ASCORBIC ACID PO   Oral   Take 1 tablet by mouth daily.         . bimatoprost (LUMIGAN) 0.01 % SOLN   Ophthalmic   Apply 1 drop to eye at bedtime.         . bisoprolol-hydrochlorothiazide (ZIAC) 5-6.25 MG per tablet   Oral   Take 1 tablet by mouth 2 (two) times daily.          Marland Kitchen buPROPion (WELLBUTRIN) 100 MG tablet   Oral   Take 100 mg by mouth daily.         . cyanocobalamin (,VITAMIN B-12,) 1000 MCG/ML injection   Intramuscular  Inject 1,000 mcg into the muscle once.         Marland Kitchen esomeprazole (NEXIUM) 40 MG capsule   Oral   Take 40 mg by mouth daily before breakfast.         . Fluticasone Furoate-Vilanterol (BREO ELLIPTA) 100-25 MCG/INH AEPB   Inhalation   Inhale 1 puff into the lungs daily.         Marland Kitchen gabapentin (NEURONTIN) 100 MG capsule   Oral   Take 200 mg by mouth at bedtime.         . insulin glargine (LANTUS) 100 UNIT/ML injection   Subcutaneous   Inject 10-12 Units into the skin at bedtime.         . IRON PO   Oral   Take 1 tablet by mouth 2 (two) times daily.         . metFORMIN (GLUCOPHAGE) 1000 MG  tablet   Oral   Take 1,000 mg by mouth 2 (two) times daily with a meal.         . Nebulizer MISC   Inhalation   Inhale 1 ampule into the lungs every 6 (six) hours as needed (wheezing or shortness of breath).         . sertraline (ZOLOFT) 50 MG tablet   Oral   Take 50 mg by mouth 2 (two) times daily.         . simvastatin (ZOCOR) 10 MG tablet   Oral   Take 10 mg by mouth at bedtime.         . valsartan (DIOVAN) 320 MG tablet   Oral   Take 320 mg by mouth daily.          BP 81/63  Pulse 34  Resp 24  SpO2 93% Physical Exam  Nursing note and vitals reviewed. Constitutional: She is oriented to person, place, and time. She appears well-developed and well-nourished.  HENT:  Head: Normocephalic and atraumatic.  Mouth/Throat: Oropharynx is clear and moist.  Eyes: Conjunctivae and EOM are normal. Pupils are equal, round, and reactive to light.  Neck: Normal range of motion. Neck supple.  Cardiovascular: Normal heart sounds and intact distal pulses.   No murmur heard. Your regular rhythm with marked bradycardia.  Pulmonary/Chest: Effort normal and breath sounds normal. No respiratory distress.  Abdominal: Soft. Bowel sounds are normal. There is no tenderness.  Musculoskeletal: Normal range of motion. She exhibits no edema.  Neurological: She is alert and oriented to person, place, and time. No cranial nerve deficit. She exhibits normal muscle tone. Coordination normal.  Skin: Skin is warm. No rash noted. She is not diaphoretic.    ED Course  Procedures (including critical care time) Labs Reviewed  CBC - Abnormal; Notable for the following:    WBC 11.5 (*)    Hemoglobin 11.1 (*)    HCT 34.8 (*)    RDW 15.6 (*)    All other components within normal limits  GLUCOSE, CAPILLARY - Abnormal; Notable for the following:    Glucose-Capillary 382 (*)    All other components within normal limits  BASIC METABOLIC PANEL  PROTIME-INR  POCT I-STAT TROPONIN I   Results for  orders placed during the hospital encounter of 09/11/12  CBC      Result Value Range   WBC 11.5 (*) 4.0 - 10.5 K/uL   RBC 4.03  3.87 - 5.11 MIL/uL   Hemoglobin 11.1 (*) 12.0 - 15.0 g/dL   HCT 16.1 (*) 09.6 - 04.5 %  MCV 86.4  78.0 - 100.0 fL   MCH 27.5  26.0 - 34.0 pg   MCHC 31.9  30.0 - 36.0 g/dL   RDW 16.1 (*) 09.6 - 04.5 %   Platelets 210  150 - 400 K/uL  GLUCOSE, CAPILLARY      Result Value Range   Glucose-Capillary 382 (*) 70 - 99 mg/dL   Comment 1 Documented in Chart     Comment 2 Notify RN     Comment 3 Confirm Test in Lab    POCT I-STAT TROPONIN I      Result Value Range   Troponin i, poc 0.02  0.00 - 0.08 ng/mL   Comment 3            Difficulty getting EKG because of interstitial bladder stimulator which is giving electrical artifact. Rhythm strip consistent with complete heart block.   Results for orders placed during the hospital encounter of 09/11/12  CBC      Result Value Range   WBC 11.5 (*) 4.0 - 10.5 K/uL   RBC 4.03  3.87 - 5.11 MIL/uL   Hemoglobin 11.1 (*) 12.0 - 15.0 g/dL   HCT 40.9 (*) 81.1 - 91.4 %   MCV 86.4  78.0 - 100.0 fL   MCH 27.5  26.0 - 34.0 pg   MCHC 31.9  30.0 - 36.0 g/dL   RDW 78.2 (*) 95.6 - 21.3 %   Platelets 210  150 - 400 K/uL  GLUCOSE, CAPILLARY      Result Value Range   Glucose-Capillary 382 (*) 70 - 99 mg/dL   Comment 1 Documented in Chart     Comment 2 Notify RN     Comment 3 Confirm Test in Lab    POCT I-STAT TROPONIN I      Result Value Range   Troponin i, poc 0.02  0.00 - 0.08 ng/mL   Comment 3               Dg Chest Portable 1 View  09/11/2012   *RADIOLOGY REPORT*  Clinical Data: Dizziness and weakness  PORTABLE CHEST - 1 VIEW  Comparison: 03/21/2005  Findings: Artifact overlies the chest.  Heart size is normal. Hiatal hernia is noted.  There is atherosclerosis of the aorta. There may be pulmonary venous hypertension but there is no frank edema.  No infiltrate or collapse.  No effusions.  No acute bony findings.   IMPRESSION: Possible pulmonary venous hypertension.  No frank edema.  No focal pulmonary abnormality.   Original Report Authenticated By: Paulina Fusi, M.D.   1. Complete heart block    CRITICAL CARE Performed by: Shelda Jakes. Total critical care time: 30 Critical care time was exclusive of separately billable procedures and treating other patients. Critical care was necessary to treat or prevent imminent or life-threatening deterioration. Critical care was time spent personally by me on the following activities: development of treatment plan with patient and/or surrogate as well as nursing, discussions with consultants, evaluation of patient's response to treatment, examination of patient, obtaining history from patient or surrogate, ordering and performing treatments and interventions, ordering and review of laboratory studies, ordering and review of radiographic studies, pulse oximetry and re-evaluation of patient's condition.   MDM  Patient presented with complete heart block escape rhythm was like a 12-16 beats per minute. Occasionally he would have a syncopal episode. Patient had no chest pain. This all just started this evening. Patient extensive workup about 2 months ago for syncope with negative findings.  Today's rhythm strip consistent with complete heart block. Provided by EMS. Difficulty needing 12-lead EKG due to electrical artifact from her bladder stimulator.  External pacing was required with pads was successful in getting her heartbeat the capture of 60 patient the with some difficulty with the pain from the stimulator. This was controlled with fentanyl and Versed as best as possible. Cardiology called. Cardiology with a temporary pacemaker and a permanent pacemaker. Cardiology will. Patient is followed by [facility] has not seen cardiology in the past. By history no evidence of chest pain or acute cardiac event. Patient has history of hypertension and diabetes.  Blood sugars were in the 300s.  Shelda Jakes, MD 09/11/12 [phone]

## 2012-09-11 NOTE — ED Notes (Signed)
Patient family called EMS due to patient having complaints of having nausea and near syncope this morning.  She then had onset of dizziness and generalized weakness.  ems found patient sitting up in chair.  She was alert and oriented.  Patient then had syncopal episode when more upright.  ems reports her bp has been stable as long as patient is in trendelenburg.  Patient heart rate has been brady in the 20's.  Patient airway is patent.  Patient with no complaints of pain.  EMS did administer 0.5mg  atropine w/o changes to her heart rate.  Patient has IV in left AC per EMS.  Patient cbg 272.  ems to bedside upon arrival

## 2012-09-12 ENCOUNTER — Inpatient Hospital Stay (HOSPITAL_COMMUNITY): Payer: Medicare Other

## 2012-09-12 DIAGNOSIS — E785 Hyperlipidemia, unspecified: Secondary | ICD-10-CM

## 2012-09-12 DIAGNOSIS — I469 Cardiac arrest, cause unspecified: Secondary | ICD-10-CM

## 2012-09-12 DIAGNOSIS — I10 Essential (primary) hypertension: Secondary | ICD-10-CM

## 2012-09-12 DIAGNOSIS — R570 Cardiogenic shock: Secondary | ICD-10-CM

## 2012-09-12 LAB — COMPREHENSIVE METABOLIC PANEL
ALT: 109 U/L — ABNORMAL HIGH (ref 0–35)
AST: 85 U/L — ABNORMAL HIGH (ref 0–37)
Albumin: 2.9 g/dL — ABNORMAL LOW (ref 3.5–5.2)
Alkaline Phosphatase: 64 U/L (ref 39–117)
BUN: 21 mg/dL (ref 6–23)
CO2: 31 mEq/L (ref 19–32)
Calcium: 8.5 mg/dL (ref 8.4–10.5)
Chloride: 105 mEq/L (ref 96–112)
Creatinine, Ser: 0.78 mg/dL (ref 0.50–1.10)
GFR calc Af Amer: 87 mL/min — ABNORMAL LOW (ref >90)
GFR calc non Af Amer: 75 mL/min — ABNORMAL LOW (ref >90)
Glucose, Bld: 108 mg/dL — ABNORMAL HIGH (ref 70–99)
Potassium: 3.6 mEq/L (ref 3.5–5.1)
Sodium: 143 mEq/L (ref 135–145)
Total Bilirubin: 0.3 mg/dL (ref 0.3–1.2)
Total Protein: 5.2 g/dL — ABNORMAL LOW (ref 6.0–8.3)

## 2012-09-12 LAB — CBC
HCT: 29.8 % — ABNORMAL LOW (ref 36.0–46.0)
MCH: 27.6 pg (ref 26.0–34.0)
MCV: 83.9 fL (ref 78.0–100.0)
Platelets: 147 10*3/uL — ABNORMAL LOW (ref 150–400)
RDW: 15.5 % (ref 11.5–15.5)
WBC: 7.2 10*3/uL (ref 4.0–10.5)

## 2012-09-12 LAB — GLUCOSE, CAPILLARY: Glucose-Capillary: 224 mg/dL — ABNORMAL HIGH (ref 70–99)

## 2012-09-12 MED ORDER — IRBESARTAN 75 MG PO TABS
75.0000 mg | ORAL_TABLET | Freq: Every day | ORAL | Status: DC
  Administered 2012-09-12 – 2012-09-14 (×3): 75 mg via ORAL
  Filled 2012-09-12 (×3): qty 1

## 2012-09-12 NOTE — Progress Notes (Addendum)
Note signed in error

## 2012-09-12 NOTE — Progress Notes (Signed)
I have left a message on our office's scheduling voicemail requesting a follow-up appointment (wound check 7-10days, then f/u Dr. Ladona Ridgel 3 months), and our office will call the patient with this appointment.  Esco Joslyn PA-C

## 2012-09-12 NOTE — Progress Notes (Signed)
PULMONARY  / CRITICAL CARE MEDICINE  Name: Wendy Velasquez MRN: [id number] DOB: 03-08-1926    ADMISSION DATE:  09/11/2012 CONSULTATION DATE:  09/11/12  REFERRING MD :  Dr. Rennis Golden PRIMARY SERVICE: Cardiology  CHIEF COMPLAINT:  CHB / Acute Resp Failure  BRIEF PATIENT DESCRIPTION: 79 y/o F who [facility] ER on 7/3 with c/o nausea & near syncope.  Found to have CHB with rate of 12-20.  CPR initiated in ER with brief loss of pulse.  Intubated for during CPR and taken emergently to cath lab.  PCCM consulted for airway management.   SIGNIFICANT EVENTS / STUDIES:  7/03 - Admit to ICU, post pacemaker insertion for CHB, cardiac arrest  LINES / TUBES: OETT 7/3>>>7/3   CULTURES:   ANTIBIOTICS: Ancef 7/3>>>7/3  SUBJECTIVE:  Feels better  VITAL SIGNS: Temp:  [97.4 F (36.3 C)-99.4 F (37.4 C)] 99.3 F (37.4 C) (07/04 0730) Pulse Rate:  [65-83] 74 (07/04 0900) Resp:  [7-22] 14 (07/04 0900) BP: (94-153)/(35-96) 121/41 mmHg (07/04 0900) SpO2:  [88 %-100 %] 98 % (07/04 0900) FiO2 (%):  [100 %] 100 % (07/03 1000) Weight:  [66.8 kg (147 lb 4.3 oz)] 66.8 kg (147 lb 4.3 oz) (07/03 1100)  VENTILATOR SETTINGS: Vent Mode:  [-] PSV;CPAP FiO2 (%):  [100 %] 100 % Set Rate:  [14 bmp] 14 bmp Vt Set:  [420 mL] 420 mL PEEP:  [5 cmH20] 5 cmH20 Pressure Support:  [5 cmH20] 5 cmH20 Plateau Pressure:  [15 cmH20] 15 cmH20  INTAKE / OUTPUT: Intake/Output     07/03 0701 - 07/04 0700 07/04 0701 - 07/05 0700   P.O. 500    I.V. (mL/kg) 405 (6.1)    Other 20    IV Piggyback 150    Total Intake(mL/kg) 1075 (16.1)    Urine (mL/kg/hr) 1540 (1)    Total Output 1540     Net -465          Urine Occurrence 1 x      PHYSICAL EXAMINATION: General:  wdwn adult female in NAD  Neuro:  A&Ox3, EOM intact. Follows commands HEENT:  mm pink/moist, no jvd Cardiovascular:  s1s2 rrr, no m/r/g Lungs:  resp's even/non-labored, lungs bilaterally clear Abdomen:  Flat/soft, bsx4 active Musculoskeletal:  No  acute deformities Skin:  Cool / dry, no edema  LABS:  Recent Labs Lab 09/11/12 0636 09/11/12 1202 09/12/12 0555  NA 135 140 143  K 4.7 4.9 3.6  CL 97 102 105  CO2 27 27 31   BUN 30* 30* 21  CREATININE 1.14* 1.02 0.78  GLUCOSE 412* 394* 108*    Recent Labs Lab 09/11/12 0636 09/12/12 0555  HGB 11.1* 9.8*  HCT 34.8* 29.8*  WBC 11.5* 7.2  PLT 210 147*    CXR: 7/3 post pacemaker   ASSESSMENT / PLAN:  PULMONARY A: Acute Respiratory Failure - resolved COPD P:   - Symbicort ordered as substitute for Breo - PRN albuterol - [place] to maintain SpO2 >88%  CARDIOVASCULAR A:  Complete Heart Block - s/p pacemaker insertion 7/3 Cardiac Arrest - in setting of CHB HTN HLD P:  - Recommendations per Cardiology - TSH 1.5 - will monitor - Restart diovan today - Will hold the bisoprolol currently  RENAL A:   At Risk ATN - creatinine 0.7 7/4 P:   - gentle hydration - follow bmet  GASTROINTESTINAL A:   NPO SUP P:   -PPI - Advance diet as tolerated  HEMATOLOGIC A:   Anemia DVT Proph P:  -  monitor H/H, no evidence of acute bleeding -consider anemia w/u as outpatient -Heparin for DVT proph  INFECTIOUS A:   S/P Pacemaker Insertion  P:   -monitor site  ENDOCRINE A:   DM2    P:   -SSI -Resume home lantus with resumption of diet  NEUROLOGIC A:   Acute Encephalopathy - in setting of cardiac arrest / CHB / poor perfusion state. Appears resolved P:   - supportive care - Will resume home antidepressants  Today's Summary: Wendy Velasquez has done well overnight following her pacemaker and extubation. Will resume her home medications. PCCM will sign off at this time, reconsult if needed.  [facility] S-ACNP  Levy Pupa, MD, PhD 09/12/2012, 10:11 AM [facility] [phone] or if no answer [phone]

## 2012-09-12 NOTE — Progress Notes (Addendum)
   79 y/o woman with HTN & HLD who presented to ER with intermittent "Siezure" spells, found to be in CHB -- upon initial evaluation by Dr. Rennis Golden, the pt became unresponsive & apneic 2/2 CHB / cardiac arrest.  Intubated by Dr. Ladona Ridgel from EP & Temporary PM placed by Dr. Rennis Golden in ED.  Taken urgently for PPM by Dr. Ladona Ridgel.   Extubated last PM.  Subjective:  Awake & alert / oriented.  BP stable. ECG shows paced rhythm.  Objective:  Vital Signs in the last 24 hours: Temp:  [97.4 F (36.3 C)-99.4 F (37.4 C)] 99.3 F (37.4 C) (07/04 0730) Pulse Rate:  [65-83] 74 (07/04 0900) Resp:  [7-22] 14 (07/04 0900) BP: (94-153)/(35-96) 121/41 mmHg (07/04 0900) SpO2:  [88 %-100 %] 98 % (07/04 0900) FiO2 (%):  [100 %] 100 % (07/03 1000) Weight:  [147 lb 4.3 oz (66.8 kg)] 147 lb 4.3 oz (66.8 kg) (07/03 1100)  Intake/Output from previous day: 07/03 0701 - 07/04 0700 In: 1075 [P.O.:500; I.V.:405; IV Piggyback:150] Out: 1540 [Urine:1540] Intake/Output from this shift:   Physical Exam: General appearance: alert, cooperative, appears stated age, no distress and pleasant Neck: no carotid bruit, no JVD and supple, symmetrical, trachea midline Lungs: clear to auscultation bilaterally, normal percussion bilaterally and mildly diminished basal BS Heart: regular rate and rhythm, S1: normal and S2: physiologically split Abdomen: soft, non-tender; bowel sounds normal; no masses,  no organomegaly Extremities: extremities normal, atraumatic, no cyanosis or edema Pulses: 2+ and symmetric Neurologic: Grossly normal  Lab Results:  Recent Labs  09/11/12 0636 09/12/12 0555  WBC 11.5* 7.2  HGB 11.1* 9.8*  PLT 210 147*    Recent Labs  09/11/12 1202 09/12/12 0555  NA 140 143  K 4.9 3.6  CL 102 105  CO2 27 31  GLUCOSE 394* 108*  BUN 30* 21  CREATININE 1.02 0.78    Recent Labs  09/11/12 1639 09/11/12 2127  TROPONINI 1.65* 1.52*   Hepatic Function Panel  Recent Labs  09/12/12 0555  PROT  5.2*  ALBUMIN 2.9*  AST 85*  ALT 109*  ALKPHOS 64  BILITOT 0.3   No results found for this basename: CHOL,  in the last 72 hours No results found for this basename: PROTIME,  in the last 72 hours  Imaging: Imaging results have been reviewed - CXR today pending  Cardiac Studies:  Assessment/Plan:  Principal Problem:   Complete heart block Active Problems:   Cardiac arrest - due to complete Heart Block, resolved   Cardiogenic shock - resolved   HTN (hypertension) - was on BB/HCTZ combination + Valsartan.   Hyperlipidemia - on staint   Acute respiratory failure - resolved, extubated 11PM 7/3   COPD (chronic obstructive pulmonary disease)   Osteoporosis   GERD (gastroesophageal reflux disease)  S/p PPM by Dr. Ladona Ridgel - extubated o/n & now looking stable. - have ordered CXR Troponin elevation is most likely 2/2 cardiac arrest.  May need non-invasive ST as OP to evaluate for CAD.  BP maintaining stable readings - may be able to re-institute some antihypertensives (would use ARB first to avoid BB/HCTZ combination to allow for less paced rhythm). - start at low dose as opposed to full dose - may adjust.  Can likely transfer to Telemetry today & anticipate d/c as soon as tomorrow provided BP & HR stable  Restart statin.    LOS: 1 day    Wendy Velasquez W 09/12/2012, 9:39 AM

## 2012-09-12 NOTE — Progress Notes (Signed)
Patient ID: Wendy Velasquez, female   DOB: December 05, 1925, 79 y.o.   MRN: [id number] Subjective:  Minimal chest soreness. Denies chest pain or sob  Objective:  Vital Signs in the last 24 hours: Temp:  [97.4 F (36.3 C)-99.4 F (37.4 C)] 99.3 F (37.4 C) (07/04 0730) Pulse Rate:  [65-83] 74 (07/04 0900) Resp:  [7-22] 14 (07/04 0900) BP: (94-153)/(35-96) 121/41 mmHg (07/04 0900) SpO2:  [88 %-100 %] 98 % (07/04 0900) FiO2 (%):  [100 %] 100 % (07/03 1000) Weight:  [147 lb 4.3 oz (66.8 kg)] 147 lb 4.3 oz (66.8 kg) (07/03 1100)  Intake/Output from previous day: 07/03 0701 - 07/04 0700 In: 1075 [P.O.:500; I.V.:405; IV Piggyback:150] Out: 1540 [Urine:1540] Intake/Output from this shift:    Physical Exam: Well appearing elderly woman, NAD HEENT: Unremarkable Neck:  6 cm JVD, no thyromegally Lungs:  Clear except for rare scattered rales. No hematoma. HEART:  Regular rate rhythm, no murmurs, no rubs, no clicks Abd:  Flat, positive bowel sounds, no organomegally, no rebound, no guarding Ext:  2 plus pulses, no edema, no cyanosis, no clubbing Skin:  No rashes no nodules Neuro:  CN II through XII intact, motor grossly intact  Lab Results:  Recent Labs  09/11/12 0636 09/12/12 0555  WBC 11.5* 7.2  HGB 11.1* 9.8*  PLT 210 147*    Recent Labs  09/11/12 1202 09/12/12 0555  NA 140 143  K 4.9 3.6  CL 102 105  CO2 27 31  GLUCOSE 394* 108*  BUN 30* 21  CREATININE 1.02 0.78    Recent Labs  09/11/12 1639 09/11/12 2127  TROPONINI 1.65* 1.52*   Hepatic Function Panel  Recent Labs  09/12/12 0555  PROT 5.2*  ALBUMIN 2.9*  AST 85*  ALT 109*  ALKPHOS 64  BILITOT 0.3   No results found for this basename: CHOL,  in the last 72 hours No results found for this basename: PROTIME,  in the last 72 hours  Imaging: Dg Chest Portable 1 View  09/11/2012   *RADIOLOGY REPORT*  Clinical Data: CPR.  Endotracheal placement.  Temporary pacemaker placement.  PORTABLE CHEST - 1 VIEW   Comparison: Earlier same day  Findings: Endotracheal tube has its tip 3.5 cm above the carina. Venous pacemaker introduced from a femoral approach has its tip in the expected location of the right ventricle.  There is developing interstitial and alveolar edema.  No pulmonary collapse.  IMPRESSION: Endotracheal tube and transvenous pacemaker grossly well positioned.  Developing pulmonary edema.   Original Report Authenticated By: Paulina Fusi, M.D.   Dg Chest Portable 1 View  09/11/2012   *RADIOLOGY REPORT*  Clinical Data: Dizziness and weakness  PORTABLE CHEST - 1 VIEW  Comparison: 03/21/2005  Findings: Artifact overlies the chest.  Heart size is normal. Hiatal hernia is noted.  There is atherosclerosis of the aorta. There may be pulmonary venous hypertension but there is no frank edema.  No infiltrate or collapse.  No effusions.  No acute bony findings.  IMPRESSION: Possible pulmonary venous hypertension.  No frank edema.  No focal pulmonary abnormality.   Original Report Authenticated By: Paulina Fusi, M.D.    Cardiac Studies: Tele - nsr with ventricular pacing Assessment/Plan:  1. Asystolic arrest 2. CHB causing #1.  3. S/p emergent PPM Rec: would transfer to tele. Observe overnight and discharge home tomorrow if stable. I will arrange PPM followup.   LOS: 1 day    Gregg Taylor,M.D. 09/12/2012, 9:39 AM

## 2012-09-13 ENCOUNTER — Inpatient Hospital Stay (HOSPITAL_COMMUNITY): Payer: Medicare Other

## 2012-09-13 DIAGNOSIS — M810 Age-related osteoporosis without current pathological fracture: Secondary | ICD-10-CM

## 2012-09-13 DIAGNOSIS — Z950 Presence of cardiac pacemaker: Secondary | ICD-10-CM

## 2012-09-13 MED ORDER — PANTOPRAZOLE SODIUM 40 MG PO TBEC
40.0000 mg | DELAYED_RELEASE_TABLET | Freq: Every day | ORAL | Status: DC
  Administered 2012-09-13: 40 mg via ORAL
  Filled 2012-09-13: qty 1

## 2012-09-13 NOTE — Progress Notes (Signed)
[facility]  Subjective: No SOB. Still has chest wall pain.   Objective: Vital signs in last 24 hours: Temp:  [98.1 F (36.7 C)-99 F (37.2 C)] 98.1 F (36.7 C) (07/05 0400) Pulse Rate:  [66-80] 71 (07/05 0700) Resp:  [0-22] 0 (07/05 0700) BP: (112-152)/(38-55) 148/52 mmHg (07/05 0700) SpO2:  [90 %-100 %] 100 % (07/05 0758) Last BM Date: 09/10/12  Intake/Output from previous day: 07/04 0701 - 07/05 0700 In: 300 [P.O.:300] Out: 1375 [Urine:1375] Intake/Output this shift:    Medications Current Facility-Administered Medications  Medication Dose Route Frequency Provider Last Rate Last Dose  . 0.9 %  sodium chloride infusion   Intravenous Continuous Marinus Maw, MD      . acetaminophen (TYLENOL) tablet 325-650 mg  325-650 mg Oral Q4H PRN Marinus Maw, MD   650 mg at 09/13/12 0326  . albuterol (PROVENTIL) (5 MG/ML) 0.5% nebulizer solution 2.5 mg  2.5 mg Nebulization Q3H PRN Merwyn Katos, MD      . bimatoprost (LUMIGAN) 0.01 % ophthalmic solution 1 drop  1 drop Both Eyes QHS Abelino Derrick, PA-C   1 drop at 09/12/12 2131  . budesonide-formoterol (SYMBICORT) 160-4.5 MCG/ACT inhaler 2 puff  2 puff Inhalation BID Merwyn Katos, MD   2 puff at 09/13/12 0757  . fentaNYL (SUBLIMAZE) injection 12.5 mcg  12.5 mcg Intravenous Q2H PRN Merwyn Katos, MD   12.5 mcg at 09/12/12 0647  . heparin injection 5,000 Units  5,000 Units Subcutaneous Q8H Marinus Maw, MD   5,000 Units at 09/13/12 0530  . insulin aspart (novoLOG) injection 0-15 Units  0-15 Units Subcutaneous TID WC Abelino Derrick, PA-C   5 Units at 09/12/12 1738  . irbesartan (AVAPRO) tablet 75 mg  75 mg Oral Daily Marykay Lex, MD   75 mg at 09/12/12 1101  . isoproterenol (ISUPREL) 4 mcg/mL in dextrose 5 % 250 mL infusion  2-20 mcg/min Intravenous Titrated Marinus Maw, MD      . ondansetron [facility]) injection 4 mg  4 mg Intravenous Q6H PRN Marinus Maw, MD      . pantoprazole (PROTONIX)  injection 40 mg  40 mg Intravenous Q24H Abelino Derrick, PA-C   40 mg at 09/12/12 2131    PE: General appearance: alert, cooperative and no distress Lungs: clear to auscultation bilaterally Heart: regular rate and rhythm Extremities: no LEE Pulses: 2+ and symmetric Skin: warm and dry Neurologic: Grossly normal  Lab Results:   Recent Labs  09/11/12 0636 09/12/12 0555  WBC 11.5* 7.2  HGB 11.1* 9.8*  HCT 34.8* 29.8*  PLT 210 147*   BMET  Recent Labs  09/11/12 0636 09/11/12 1202 09/12/12 0555  NA 135 140 143  K 4.7 4.9 3.6  CL 97 102 105  CO2 27 27 31   GLUCOSE 412* 394* 108*  BUN 30* 30* 21  CREATININE 1.14* 1.02 0.78  CALCIUM 8.9 8.5 8.5   PT/INR  Recent Labs  09/11/12 0636  LABPROT 15.6*  INR 1.27   Assessment/Plan  Principal Problem:   Complete heart block Active Problems:   Cardiac arrest - due to complete Heart Block, resolved   HTN (hypertension) - was on BB/HCTZ combination + Valsartan.   Hyperlipidemia - on staint   COPD (chronic obstructive pulmonary disease)   Osteoporosis   GERD (gastroesophageal reflux disease)   Acute respiratory failure - resolved, extubated 11PM 7/3   Cardiogenic shock - resolved  Plan: Day  2 s/p PPM in the setting of CHB. No SOB or chest pain. A CXR yesterday demonstrated proper lead placement and no evidence of pneumothorax. However, pt continues to endorse significant chest wall soreness. Will order a limited CXR to assess the ribs for possible fracture.  HR is stable. BP is mildly elevated. An ARB was added yesterday. Ambulate today and if stable transfer to telemetry. Continue in arm sling.   LOS: 2 days    Wendy Velasquez Islam 09/13/2012 8:31 AM  Doing well overall -- still quite sore.  Has yet to walk.  LL Chest wall is extremely sore.   Will get dedicated Rib films & d/c trauma Svc re options for bracing / etc.  PPM working well - A s VP.    BP stable.    Transfer to tele today & ambulate.  Hopefully  home by tomorrow.  Marykay Lex, M.D., M.S. [facility] [address] [place], [place]  [address]  [phone] Pager # [phone] 09/13/2012 9:11 AM

## 2012-09-13 NOTE — Progress Notes (Signed)
This am breakfast tray came and pt had eaten before CBG was drawn.  No SS coverage given due to that.  Explained we need to check sugar prior to eating, but the same thing happened at lunch.  CBG done just to assess and sugar was 302, 3 units Novolog given and sugar repeated in one hour 315.  Pt quiet, resting no symptoms. Will repeat again in one hour.

## 2012-09-13 NOTE — Progress Notes (Signed)
PHARMACIST - PHYSICIAN COMMUNICATION DR:   Rennis Golden CONCERNING: Protonix IV to Oral Route Change Policy  RECOMMENDATION: This patient is receiving Protonix by the intravenous route.  Based on criteria approved by the Pharmacy and Therapeutics Committee, this drug is being converted to the equivalent oral dose form(s).  DESCRIPTION: These criteria include:  The patient is eating (either orally or via tube) and/or has been taking other orally administered medications for a least 24 hours  There is no active GI bleed or impaired GI absorption noted.   If you have questions about this conversion, please contact the Pharmacy Department  []   [phone] )  [facility] [x]   [phone] )  [facility]  []   [phone] )  [facility] []   [phone] )  [facility]

## 2012-09-14 LAB — CBC
HCT: 30.2 % — ABNORMAL LOW (ref 36.0–46.0)
Hemoglobin: 9.7 g/dL — ABNORMAL LOW (ref 12.0–15.0)
MCV: 86 fL (ref 78.0–100.0)
RDW: 15.6 % — ABNORMAL HIGH (ref 11.5–15.5)
WBC: 5 10*3/uL (ref 4.0–10.5)

## 2012-09-14 LAB — GLUCOSE, CAPILLARY
Glucose-Capillary: 252 mg/dL — ABNORMAL HIGH (ref 70–99)
Glucose-Capillary: 224 mg/dL — ABNORMAL HIGH (ref 70–99)

## 2012-09-14 LAB — BASIC METABOLIC PANEL
BUN: 15 mg/dL (ref 6–23)
CO2: 30 mEq/L (ref 19–32)
Chloride: 102 mEq/L (ref 96–112)
Creatinine, Ser: 0.52 mg/dL (ref 0.50–1.10)
Glucose, Bld: 250 mg/dL — ABNORMAL HIGH (ref 70–99)

## 2012-09-14 MED ORDER — IRBESARTAN 75 MG PO TABS: 75.0000 mg | ORAL_TABLET | Freq: Every day | ORAL | Status: DC

## 2012-09-14 NOTE — Progress Notes (Signed)
Subjective:  Still c/o mild chest wall pain  Objective:  Temp:  [98.1 F (36.7 C)-99 F (37.2 C)] 98.1 F (36.7 C) (07/06 0500) Pulse Rate:  [74-92] 92 (07/06 0500) Resp:  [17-18] 18 (07/06 0500) BP: (138-161)/(50-70) 161/70 mmHg (07/06 0500) SpO2:  [99 %-100 %] 99 % (07/06 0500) Weight change:   Intake/Output from previous day: 07/05 0701 - 07/06 0700 In: -  Out: 150 [Urine:150]  Intake/Output from this shift:    Physical Exam: General appearance: alert and no distress Neck: no adenopathy, no carotid bruit, no JVD, supple, symmetrical, trachea midline and thyroid not enlarged, symmetric, no tenderness/mass/nodules Lungs: clear to auscultation bilaterally Heart: regular rate and rhythm, S1, S2 normal, no murmur, click, rub or gallop Extremities: extremities normal, atraumatic, no cyanosis or edema  Lab Results: Results for orders placed during the hospital encounter of 09/11/12 (from the past 48 hour(s))  GLUCOSE, CAPILLARY     Status: Abnormal   Collection Time    09/12/12 11:50 AM      Result Value Range   Glucose-Capillary 169 (*) 70 - 99 mg/dL  GLUCOSE, CAPILLARY     Status: Abnormal   Collection Time    09/12/12  5:29 PM      Result Value Range   Glucose-Capillary 227 (*) 70 - 99 mg/dL  GLUCOSE, CAPILLARY     Status: Abnormal   Collection Time    09/12/12  9:30 PM      Result Value Range   Glucose-Capillary 224 (*) 70 - 99 mg/dL  GLUCOSE, CAPILLARY     Status: Abnormal   Collection Time    09/13/12  9:18 AM      Result Value Range   Glucose-Capillary 284 (*) 70 - 99 mg/dL  GLUCOSE, CAPILLARY     Status: Abnormal   Collection Time    09/13/12  1:43 PM      Result Value Range   Glucose-Capillary 302 (*) 70 - 99 mg/dL  GLUCOSE, CAPILLARY     Status: Abnormal   Collection Time    09/13/12  2:40 PM      Result Value Range   Glucose-Capillary 315 (*) 70 - 99 mg/dL  GLUCOSE, CAPILLARY     Status: Abnormal   Collection Time    09/13/12  3:47 PM   Result Value Range   Glucose-Capillary 267 (*) 70 - 99 mg/dL  GLUCOSE, CAPILLARY     Status: Abnormal   Collection Time    09/13/12  5:35 PM      Result Value Range   Glucose-Capillary 216 (*) 70 - 99 mg/dL  GLUCOSE, CAPILLARY     Status: Abnormal   Collection Time    09/13/12  8:07 PM      Result Value Range   Glucose-Capillary 203 (*) 70 - 99 mg/dL  CBC     Status: Abnormal   Collection Time    09/14/12  5:03 AM      Result Value Range   WBC 5.0  4.0 - 10.5 K/uL   RBC 3.51 (*) 3.87 - 5.11 MIL/uL   Hemoglobin 9.7 (*) 12.0 - 15.0 g/dL   HCT 16.1 (*) 09.6 - 04.5 %   MCV 86.0  78.0 - 100.0 fL   MCH 27.6  26.0 - 34.0 pg   MCHC 32.1  30.0 - 36.0 g/dL   RDW 40.9 (*) 81.1 - 91.4 %   Platelets 122 (*) 150 - 400 K/uL  BASIC METABOLIC PANEL     Status:  Abnormal   Collection Time    09/14/12  5:03 AM      Result Value Range   Sodium 138  135 - 145 mEq/L   Potassium 3.4 (*) 3.5 - 5.1 mEq/L   Chloride 102  96 - 112 mEq/L   CO2 30  19 - 32 mEq/L   Glucose, Bld 250 (*) 70 - 99 mg/dL   BUN 15  6 - 23 mg/dL   Creatinine, Ser 1.91  0.50 - 1.10 mg/dL   Calcium 8.2 (*) 8.4 - 10.5 mg/dL   GFR calc non Af Amer 85 (*) >90 mL/min   GFR calc Af Amer >90  >90 mL/min   Comment:            The eGFR has been calculated     using the CKD EPI equation.     This calculation has not been     validated in all clinical     situations.     eGFR's persistently     <90 mL/min signify     possible Chronic Kidney Disease.    Imaging: Imaging results have been reviewed  Assessment/Plan:   1. Principal Problem: 2.   Complete heart block 3. Active Problems: 4.   Cardiac arrest - due to complete Heart Block, resolved 5.   HTN (hypertension) - was on BB/HCTZ combination + Valsartan. 6.   Hyperlipidemia - on staint 7.   COPD (chronic obstructive pulmonary disease) 8.   Osteoporosis 9.   GERD (gastroesophageal reflux disease) 10.   Acute respiratory failure - resolved, extubated 11PM 7/3 11.    Cardiogenic shock - resolved 12.   S/P cardiac pacemaker procedure 13.   Time Spent Directly with Patient:  20 minutes  Length of Stay:  LOS: 3 days   V pacing. There appears to be multiple pacer spikes that are probably artifact but will get rep to interrogate today prior to D/C. Hgb slightly down. CXR shows to rib Fx. Will prob need wheelchair and home PT. OK for D/C home after that. ROV with Dr. Rennis Golden.  Runell Gess 09/14/2012, 9:17 AM

## 2012-09-14 NOTE — Progress Notes (Signed)
Received call from Judith RN caring for patient on 3W concerning HH discharge orders. Pt lives with husband in [place] ( [place] [address] ) has Medicare insurance. Called and Spoke with patient and daughter Bunny, concerning home PT recommendation. Offered choice of HH agencies Pt and daughter stated, Pt had been active with  HH services in the past with [facility] and desired to use Frankie at [facility] again.  Referral called and faxed to [facility] for PT/ w/c at [phone] .Also called and spoke with Germaine at [facility] to deliver DME w/c to room. Called  and confirmed the plan of care with patient and family. RN Judith notified of HH set up. Explained that [facility] will contact patient and family at number provided [phone] or [phone] with 24-48 hours. Pt and verbalized understanding.  W. Hiliana Eilts RN BSN   

## 2012-09-16 ENCOUNTER — Telehealth: Payer: Self-pay | Admitting: Internal Medicine

## 2012-09-16 DIAGNOSIS — R55 Syncope and collapse: Secondary | ICD-10-CM | POA: Diagnosis not present

## 2012-09-16 DIAGNOSIS — E119 Type 2 diabetes mellitus without complications: Secondary | ICD-10-CM | POA: Diagnosis not present

## 2012-09-16 DIAGNOSIS — J449 Chronic obstructive pulmonary disease, unspecified: Secondary | ICD-10-CM | POA: Diagnosis not present

## 2012-09-16 DIAGNOSIS — R05 Cough: Secondary | ICD-10-CM | POA: Diagnosis not present

## 2012-09-16 DIAGNOSIS — D509 Iron deficiency anemia, unspecified: Secondary | ICD-10-CM | POA: Diagnosis not present

## 2012-09-16 DIAGNOSIS — Z950 Presence of cardiac pacemaker: Secondary | ICD-10-CM | POA: Diagnosis not present

## 2012-09-16 MED ORDER — IRBESARTAN 75 MG PO TABS: 75.0000 mg | ORAL_TABLET | Freq: Every day | ORAL | Status: DC

## 2012-09-16 NOTE — Discharge Summary (Signed)
Physician Discharge Summary  Patient ID: Wendy Velasquez MRN: [id number] DOB/AGE: 1925-10-14 79 y.o.  Admit date: 09/11/2012 Discharge date: 09/14/2012  Admission Diagnoses: Complete Heart Block  Discharge Diagnoses:  Principal Problem:   Complete heart block Active Problems:   Cardiac arrest - due to complete Heart Block, resolved   HTN (hypertension) - was on BB/HCTZ combination + Valsartan.   Hyperlipidemia - on staint   COPD (chronic obstructive pulmonary disease)   Osteoporosis   GERD (gastroesophageal reflux disease)   Acute respiratory failure - resolved, extubated 11PM 7/3   Cardiogenic shock - resolved   S/P cardiac pacemaker procedure   Discharged Condition: stable  Hospital Course: This is a 79 y.o. female with a past medical history significant for COPD, HTN, GERD, HLD and weakness. On 09/11/12, she suffered a syncopal episode at her home. 911 was called. When EMS arrived she was noted to be extremely bradycardic. 12-lead EKG showed complete heart block. She was administered atropine in route and loss consciousness and when she awakened she vomited. External pacing was attempted. Upon arrival to the trauma bay she was noted to be in complete heart block with a slow ventricular response. Ventricular rate was less than 20. She was hypotensive, ashen and poorly perfusing. Mental status was altered. She had external pacing pads applied with apparent capture on high output of a rate of 60. She was evaluated by Dr. Rennis Golden in the emergency room. Her family was at the bedside and the patient was noncommunicative on a nonrebreather. Shortly thereafter her pupils became fixed and dilated and she lost muscle tone. Pulses were checked and barely palpable, and loss of mechanical capture was confirmed. CPR was initiated and 1 mg of epinephrine was administered IV. Dr. Rennis Golden had notified Dr. Ladona Ridgel with cardiac electrophysiology and he arrived around this time. They immediately called cardiac arrest  and started resuscitation. Dr. Ladona Ridgel secured the airway with intubation and Dr. Rennis Golden placed a temporary transvenous pacer from the right femoral vein (see separate procedure note for details). They were able to achieve capture fairly quickly with the temporary pacer. After temporary pacing, she stabilized with an improved blood pressure of around 100 systolic, however epinephrine was starting to wear off and her blood pressure had dropped into the 70s to 80s systolic. A discussion with the family regarding placement of a permanent pacemaker was held and they are agreeable to this. Dopamine was initiated, and the patient was taken to the cardiac catheterization lab urgently for a pacemaker. This was performed by Dr. Ladona Ridgel. She received a dual chamber, [employer] pacemaker. She tolerated the procedure well and left the OR in stable condition. She had no post-operative complications. A post-operative CXR demonstrated proper lead placement and no evidence of a pneumothroax. There was also no evidence of fractured/displaced ribs. Device interrogation demonstrated normal device functioning. She was evaluated by physical therapy, who recommenced home PT. She was last seen and examined by Dr. Allyson Sabal on hospital day 3, who determined that she was stable for discharge home. She will follow up with Dr. Rennis Golden, at [facility], on 09/30/12.   Consults: Electrophysiology  Significant Diagnostic Studies:  CXR 09/12/12 *RADIOLOGY REPORT*  Clinical Data: Status post pacer placement  CHEST - 2 VIEW  Comparison: 09/11/2012  Findings: Interval placement of left chest wall pacer device. The  leads are in the right atrial appendage and right ventricle. No  pneumothorax identified. Mild cardiac enlargement is stable. There  are small bilateral pleural effusions noted. Pulmonary vascular  congestion  is present.  IMPRESSION:  1. Small pleural effusions and pulmonary vascular congestion.  2. No complications after pacer placement.   Original Report Authenticated By: Signa Kell, M.D.   Treatments: See Hospital Course  Discharge Exam: Blood pressure 161/70, pulse 92, temperature 98.1 F (36.7 C), temperature source Oral, resp. rate 18, height 5\' 4"  (1.626 m), weight 147 lb 4.3 oz (66.8 kg), SpO2 99.00%.   Disposition: 01-Home or Self Care  Discharge Orders   Future Appointments Provider Department Dept Phone   09/17/2012 4:00 PM [facility] [employer] Main [facility] [place]) [phone]   09/30/2012 11:45 AM Chrystie Nose, MD [facility] [place] [phone]   Future Orders Complete By Expires     Diet - low sodium heart healthy  As directed     Increase activity slowly  As directed         Medication List    STOP taking these medications       bisoprolol-hydrochlorothiazide 5-6.25 MG per tablet  Commonly known as:  ZIAC     valsartan 320 MG tablet  Commonly known as:  DIOVAN      TAKE these medications       albuterol-ipratropium 18-103 MCG/ACT inhaler  Commonly known as:  COMBIVENT  Inhale 2 puffs into the lungs every 6 (six) hours as needed for wheezing.     ASCORBIC ACID PO  Take 1 tablet by mouth daily.     bimatoprost 0.01 % Soln  Commonly known as:  LUMIGAN  Apply 1 drop to eye at bedtime.     BREO ELLIPTA 100-25 MCG/INH Aepb  Generic drug:  Fluticasone Furoate-Vilanterol  Inhale 1 puff into the lungs daily.     buPROPion 100 MG tablet  Commonly known as:  WELLBUTRIN  Take 100 mg by mouth daily.     cyanocobalamin 1000 MCG/ML injection  Commonly known as:  (VITAMIN B-12)  Inject 1,000 mcg into the muscle once.     esomeprazole 40 MG capsule  Commonly known as:  NEXIUM  Take 40 mg by mouth daily before breakfast.     gabapentin 100 MG capsule  Commonly known as:  NEURONTIN  Take 200 mg by mouth at bedtime.     insulin glargine 100 UNIT/ML injection  Commonly known as:  LANTUS  Inject 10-12 Units into the skin at bedtime.      IRON PO  Take 1 tablet by mouth 2 (two) times daily.     metFORMIN 1000 MG tablet  Commonly known as:  GLUCOPHAGE  Take 1,000 mg by mouth 2 (two) times daily with a meal.     Nebulizer Misc  Inhale 1 ampule into the lungs every 6 (six) hours as needed (wheezing or shortness of breath).     sertraline 50 MG tablet  Commonly known as:  ZOLOFT  Take 50 mg by mouth 2 (two) times daily.     simvastatin 10 MG tablet  Commonly known as:  ZOCOR  Take 10 mg by mouth at bedtime.           Follow-up Information   Follow up with Lewayne Bunting, MD. (Our office will call you for a follow-up appointment (wound check in about a week, and then followup with Dr. Ladona Ridgel in 3 months). Please call the office if you have not heard from [place] within 4 days.)    Contact information:   [address] [place] [address] [phone]  Follow up with Chrystie Nose, MD. (our office will call you with a follow-up appointment)    Contact information:   [address] [place] [address] [phone]      TIME SPENT ON DISCHARGE, INCLUDING PHYSICIAN TIME: >30 MINUTES  Signed: Allayne Butcher, PA-C 09/16/2012, 4:16 PM

## 2012-09-16 NOTE — Telephone Encounter (Signed)
Please call -question about medication that was prescribed in the hospital!

## 2012-09-16 NOTE — Telephone Encounter (Signed)
Returned call and spoke w/ Lynden Ang.  Stated pt's new BP med (irbesartan) was sent to [facility] instead of the local pharmacy.  Asked if script could be sent to [facility] on [address] b/c it may take a week to get the mail order med and pt was supposed to start it yesterday.  Sherian Rein, NP notified and advised resend.  Rx sent to [facility] as requested and Rx resent to [facility] for 90-day supply per request.  Lynden Ang informed and verbalized understanding.

## 2012-09-17 ENCOUNTER — Ambulatory Visit (INDEPENDENT_AMBULATORY_CARE_PROVIDER_SITE_OTHER): Payer: Medicare Other | Admitting: *Deleted

## 2012-09-17 ENCOUNTER — Encounter: Payer: Self-pay | Admitting: Internal Medicine

## 2012-09-17 DIAGNOSIS — I469 Cardiac arrest, cause unspecified: Secondary | ICD-10-CM | POA: Diagnosis not present

## 2012-09-17 DIAGNOSIS — I442 Atrioventricular block, complete: Secondary | ICD-10-CM

## 2012-09-17 LAB — PACEMAKER DEVICE OBSERVATION
AL AMPLITUDE: 3 mv
BATTERY VOLTAGE: 3.1434 V
RV LEAD IMPEDENCE PM: 440 Ohm
VENTRICULAR PACING PM: 100

## 2012-09-17 NOTE — Progress Notes (Signed)
Wound check-PPM in office. 

## 2012-09-19 ENCOUNTER — Telehealth: Payer: Self-pay | Admitting: Internal Medicine

## 2012-09-19 NOTE — Telephone Encounter (Signed)
Message received that [facility] called back and pt's BP was 144/80.    Message forwarded to Hinda Glatter, PA-C for further instructions.

## 2012-09-19 NOTE — Telephone Encounter (Signed)
Returned call.  Stated pt started an abx Monday night and saw PCP Tuesday morning for sinus infection.  Stated last night was the first night pt had the new BP med that Dr. Rennis Golden rx'd for her.  Stated pt was feeling well up until today.  Stated pt c/o feeling exhausted, w/ no energy and wants to know if this is r/t the BP med.  Informed Avapro can cause fatigue.  Asked what pt's BP was today and Lynden Ang stated she hasn't checked.  Stated she will run to her house to get BP cuff and check and call back.  Will await call back.

## 2012-09-19 NOTE — Telephone Encounter (Signed)
Pt's daughter called concerned about the new medication that her mother is on. She would like a call back so she can get some clarification.

## 2012-09-19 NOTE — Telephone Encounter (Signed)
New Prob      Would like to speak to nurse regarding pt recent appointment and recent symptoms. Please call.

## 2012-09-19 NOTE — Telephone Encounter (Signed)
**Note De-Identified Wendy Velasquez Obfuscation** Pts daughter, Wendy Velasquez, states that the pt is very fatigued and weak and she wants to know if this could be due to PPM insertion. Pt had PPM insertion on 7/3 with Dr Ladona Ridgel and came to office on 7/9 for wound check and was doing well at that time. She has since developed a sinus infection and has been placed on an antibiotic by her PCP and according to pts daughter, Wendy Velasquez, the pt also started taking Avapro 75 mg daily yesterday. Wendy Velasquez is advised that the pts age, sinus infection, new medication and having recent surgery may all be contributing to her fatigue and weakness. She is advised to contact pts cardiologist, Dr Rennis Golden, concerning Avapro and to give antibiotic more time to work, she verbalized understanding.

## 2012-09-19 NOTE — Telephone Encounter (Signed)
Returned call and informed [name] per PA.  Lynden Ang also advised to make sure pt stays hydrated and continue to monitor BP.  If BP <100/60 hold med and call.  Verbalized understanding and agreed w/ plan.

## 2012-09-19 NOTE — Telephone Encounter (Signed)
Extremely unlikely that Avavpro is causing her fatigue.

## 2012-09-22 DIAGNOSIS — Z45018 Encounter for adjustment and management of other part of cardiac pacemaker: Secondary | ICD-10-CM | POA: Diagnosis not present

## 2012-09-22 DIAGNOSIS — IMO0001 Reserved for inherently not codable concepts without codable children: Secondary | ICD-10-CM | POA: Diagnosis not present

## 2012-09-22 DIAGNOSIS — J441 Chronic obstructive pulmonary disease with (acute) exacerbation: Secondary | ICD-10-CM | POA: Diagnosis not present

## 2012-09-22 DIAGNOSIS — Z8674 Personal history of sudden cardiac arrest: Secondary | ICD-10-CM | POA: Diagnosis not present

## 2012-09-22 DIAGNOSIS — R82998 Other abnormal findings in urine: Secondary | ICD-10-CM | POA: Diagnosis not present

## 2012-09-22 DIAGNOSIS — R300 Dysuria: Secondary | ICD-10-CM | POA: Diagnosis not present

## 2012-09-22 DIAGNOSIS — I10 Essential (primary) hypertension: Secondary | ICD-10-CM | POA: Diagnosis not present

## 2012-09-24 DIAGNOSIS — J441 Chronic obstructive pulmonary disease with (acute) exacerbation: Secondary | ICD-10-CM | POA: Diagnosis not present

## 2012-09-24 DIAGNOSIS — IMO0001 Reserved for inherently not codable concepts without codable children: Secondary | ICD-10-CM | POA: Diagnosis not present

## 2012-09-24 DIAGNOSIS — I10 Essential (primary) hypertension: Secondary | ICD-10-CM | POA: Diagnosis not present

## 2012-09-24 DIAGNOSIS — Z8674 Personal history of sudden cardiac arrest: Secondary | ICD-10-CM | POA: Diagnosis not present

## 2012-09-24 DIAGNOSIS — Z45018 Encounter for adjustment and management of other part of cardiac pacemaker: Secondary | ICD-10-CM | POA: Diagnosis not present

## 2012-09-26 ENCOUNTER — Telehealth: Payer: Self-pay | Admitting: Internal Medicine

## 2012-09-26 DIAGNOSIS — J441 Chronic obstructive pulmonary disease with (acute) exacerbation: Secondary | ICD-10-CM | POA: Diagnosis not present

## 2012-09-26 DIAGNOSIS — Z8674 Personal history of sudden cardiac arrest: Secondary | ICD-10-CM | POA: Diagnosis not present

## 2012-09-26 DIAGNOSIS — IMO0001 Reserved for inherently not codable concepts without codable children: Secondary | ICD-10-CM | POA: Diagnosis not present

## 2012-09-26 DIAGNOSIS — I10 Essential (primary) hypertension: Secondary | ICD-10-CM | POA: Diagnosis not present

## 2012-09-26 DIAGNOSIS — Z45018 Encounter for adjustment and management of other part of cardiac pacemaker: Secondary | ICD-10-CM | POA: Diagnosis not present

## 2012-09-26 NOTE — Telephone Encounter (Signed)
Thanks. This does not seem to be related to the pacemaker. She saw Dr. Ladona Ridgel on 7/9 and there was no problem with the pacer. If she thinks there is a problem tolerating her antibiotics for UTI, she should contact her doctor that prescribed them.    Dr. Rennis Golden

## 2012-09-26 NOTE — Telephone Encounter (Signed)
Ms. Clovis Riley states that Wendy Velasquez is very nauseated today and her BP is low.  She is on an antibiotic for a kidney infection.  She also had a pacemaker implanted recently.  Please call and advise if some of her symptoms could be from the antibiotic.

## 2012-09-26 NOTE — Telephone Encounter (Signed)
Returned call and spoke w/ Hassie Bruce, pt's daughter.  Stated pt developed a real bad chest cold and was given Abx and then developed a UTI and was given Abx.  Daughter stated she didn't know if symptoms from Abx or new BP med.  Stated pt has been nauseated, had trouble eating and is "lifeless."  Asked daughter to clarify "lifeless."  Stated pt hasn't had much energy.  Stated she did get pt to eat some yogurt, but she doesn't really have an appetite.  Stated she called pt's PCP to see if maybe the Abx was too strong and to find out if they want to adjust it.  Stated BP has been in 120s/80s range.  Today 98/53 HR 100 and after Neb Tx 118/65 HR 95.  Daughter informed pt's nausea is likely r/t the Abx she is taking.  Advised pt increase fluid intake as she may be dehydrated.  Daughter stated pt "stays dehydrated."  Stated pt has incontinence (urinary) and doesn't drink much b/c she wets herself.  Informed of importance of staying hydrated.  Pt's PCP called while RN on phone.  Daughter will call back for BP 100/60 or less and be sure to check BP before giving BP med.    Appt w/ Dr. Rennis Golden on 7.22.14.

## 2012-09-30 ENCOUNTER — Ambulatory Visit (INDEPENDENT_AMBULATORY_CARE_PROVIDER_SITE_OTHER): Payer: Medicare Other | Admitting: Internal Medicine

## 2012-09-30 ENCOUNTER — Encounter: Payer: Self-pay | Admitting: Internal Medicine

## 2012-09-30 VITALS — BP 134/82 | HR 90 | Ht 64.5 in | Wt 130.2 lb

## 2012-09-30 DIAGNOSIS — Z950 Presence of cardiac pacemaker: Secondary | ICD-10-CM | POA: Diagnosis not present

## 2012-09-30 DIAGNOSIS — E785 Hyperlipidemia, unspecified: Secondary | ICD-10-CM

## 2012-09-30 DIAGNOSIS — R570 Cardiogenic shock: Secondary | ICD-10-CM

## 2012-09-30 DIAGNOSIS — I10 Essential (primary) hypertension: Secondary | ICD-10-CM

## 2012-09-30 DIAGNOSIS — I469 Cardiac arrest, cause unspecified: Secondary | ICD-10-CM | POA: Diagnosis not present

## 2012-09-30 DIAGNOSIS — I442 Atrioventricular block, complete: Secondary | ICD-10-CM

## 2012-09-30 NOTE — Patient Instructions (Addendum)
Your physician wants you to follow-up in:  6 months. You will receive a reminder letter in the mail two months in advance. If you don't receive a letter, please call our office to schedule the follow-up appointment.   

## 2012-09-30 NOTE — Progress Notes (Signed)
OFFICE NOTE  Chief Complaint:  Hospital follow-up  Primary Care Physician: Gaspar Garbe, MD  HPI:  Wendy Velasquez is a 79 y.o. female with a past medical history significant for COPD, HTN, GERD, HPL and weakness. She apparently got up to go to the bathroom recently and passed out. When EMS arrived she was noted to be extremely bradycardic. 12-lead EKG showed complete heart block. She was administered atropine in route and loss consciousness and when she awakened she vomited. External pacing was attempted. Upon arrival to the trauma bay she was noted to be in complete heart block with a slow ventricular response. Ventricular rate was less than 20. She was hypotensive, ashen and poorly perfusing. Mental status was altered. She had external pacing pads applied with apparent capture on high output of a rate of 60. When I arrived in the emergency room, family was at the bedside and the patient was noncommunicative on a nonrebreather. Shortly thereafter her pupils became fixed and dilated and she lost muscle tone. Pulses were checked and barely palpable, and loss of mechanical capture was confirmed. CPR was initiated and 1 mg of epinephrine was administered IV. I had notified Dr. Ladona Ridgel with cardiac electrophysiology and he arrived around this time. We immediately called cardiac arrest and started resuscitation. Dr. Ladona Ridgel secured the airway with intubation and I placed a temporary transvenous pacing from the right femoral vein (see separate procedure note for details). We were able to achieve capture fairly quickly with the temporary pacer. After temporary pacing she stabilized with an improved blood pressure of around 100 systolic, however epinephrine was starting to wear off and her blood pressure had dropped into the 70s to 80s systolic. We had a discussion with the family regarding placement of a permanent pacemaker and they are agreeable to this. She underwent successful pacemaker placement by Dr.  Ladona Ridgel. Her hospitalization was fairly brief afterwards and she recovered quite quickly. Unfortunately she did undergo short period of CPR does have some pain associated with that. He may have aspirated and possibly had a aspiration pneumonitis on top of underlying COPD. She's been on antibiotics for that as well as a urinary tract infection which has been difficult to treat. Both of which are improving. Overall she still feels weak and at times has some low blood pressures. She was started on Avapro in the hospital. Some of her blood pressures that I reviewed today are low with systolics below 100.  PMHx:  Past Medical History  Diagnosis Date  . Hypertension   . COPD (chronic obstructive pulmonary disease)   . Diabetes mellitus without complication   . Hypercholesterolemia   . GERD (gastroesophageal reflux disease)   . Pacemaker 09/11/2012    dual chamber  . Dysrhythmia 09/11/2012    complete heart block   with CPR  . Asthma     Past Surgical History  Procedure Laterality Date  . Interstim implant placement    . Insert / replace / remove pacemaker  09/11/2012    DUAL CHAMBER  DR Ladona Ridgel  . Abdominal hysterectomy      FAMHx:  No family history on file.  SOCHx:   reports that she has never smoked. She has never used smokeless tobacco. She reports that she does not drink alcohol or use illicit drugs.  ALLERGIES:  No Known Allergies  ROS: A comprehensive review of systems was negative except for: Constitutional: positive for fatigue Respiratory: positive for chronic bronchitis and pleurisy/chest pain Cardiovascular: positive for dyspnea Genitourinary: positive for  urinary incontinence  HOME MEDS: Current Outpatient Prescriptions  Medication Sig Dispense Refill  . albuterol-ipratropium (COMBIVENT) 18-103 MCG/ACT inhaler Inhale 2 puffs into the lungs every 6 (six) hours as needed for wheezing.      . ASCORBIC ACID PO Take 1 tablet by mouth daily.      . bimatoprost (LUMIGAN)  0.01 % SOLN Apply 1 drop to eye at bedtime.      Marland Kitchen buPROPion (WELLBUTRIN) 100 MG tablet Take 100 mg by mouth daily.      . Ciprofloxacin (CIPRO PO) Take by mouth daily.      . cyanocobalamin (,VITAMIN B-12,) 1000 MCG/ML injection Inject 1,000 mcg into the muscle once.      Marland Kitchen esomeprazole (NEXIUM) 40 MG capsule Take 40 mg by mouth daily before breakfast.      . Fluticasone Furoate-Vilanterol (BREO ELLIPTA) 100-25 MCG/INH AEPB Inhale 1 puff into the lungs daily.      Marland Kitchen gabapentin (NEURONTIN) 100 MG capsule Take 200 mg by mouth at bedtime.      . insulin glargine (LANTUS) 100 UNIT/ML injection Inject 10-12 Units into the skin at bedtime.      . irbesartan (AVAPRO) 75 MG tablet Take 1 tablet (75 mg total) by mouth daily.  90 tablet  2  . IRON PO Take 1 tablet by mouth 2 (two) times daily.      . metFORMIN (GLUCOPHAGE) 1000 MG tablet Take 1,000 mg by mouth 2 (two) times daily with a meal.      . Nebulizer MISC Inhale 1 ampule into the lungs every 6 (six) hours as needed (wheezing or shortness of breath).      . sertraline (ZOLOFT) 50 MG tablet Take 50 mg by mouth 2 (two) times daily.      . simvastatin (ZOCOR) 10 MG tablet Take 10 mg by mouth at bedtime.       No current facility-administered medications for this visit.    LABS/IMAGING: No results found for this or any previous visit (from the past 48 hour(s)). No results found.  VITALS: BP 134/82  Pulse 90  Ht 5' 4.5" (1.638 m)  Wt 130 lb 3.2 oz (59.058 kg)  BMI 22.01 kg/m2  EXAM: General appearance: alert and no distress Neck: no adenopathy, no carotid bruit, no JVD, supple, symmetrical, trachea midline and thyroid not enlarged, symmetric, no tenderness/mass/nodules Lungs: clear to auscultation bilaterally Heart: regular rate and rhythm, S1, S2 normal, no murmur, click, rub or gallop Abdomen: soft, non-tender; bowel sounds normal; no masses,  no organomegaly Extremities: extremities normal, atraumatic, no cyanosis or edema and  Pacemaker site appears well-healed without hematoma or erythema Pulses: 2+ and symmetric Skin: Skin color, texture, turgor normal. No rashes or lesions Neurologic: Grossly normal  EKG: AV sequential pacing at 90  ASSESSMENT: 1. Complete heart block status post dual-chamber St. Jude pacemaker 2. Cardiac arrest-resuscitated 3. COPD 4. Probable aspiration pneumonitis 5. Urinary tract infection-resolving 6. Hypotension, possibly related to medication  PLAN: 1.   Wendy Velasquez appears to be doing very well and fortunately survived her in-hospital arrest. She is having no problems with her pacemaker and really is recovering from complications of those events. She probably had an aspiration pneumonitis or pneumonia which on top of her known COPD, has been difficult to treat with antibiotics. She also urinary tract infection which is being treated. She's had some periods of hypotension which may be related to the additional ARB medicines she received. I would recommend discontinuing her Avapro due to the  low systolic blood pressure she demonstrated. Hopefully this will help with some of her lightheadedness and weakness. She did hold her medication for one day and noted her blood pressures were not any higher than 130 systolic. Overall she's doing remarkably well and will plan to see her back in 6 months. Followup for her pacemaker will be through Dr. Lubertha Basque office.  Chrystie Nose, MD, [facility] Attending Cardiologist [facility]  Dani Wallner C 09/30/2012, 4:00 PM

## 2012-10-01 DIAGNOSIS — IMO0001 Reserved for inherently not codable concepts without codable children: Secondary | ICD-10-CM | POA: Diagnosis not present

## 2012-10-01 DIAGNOSIS — I10 Essential (primary) hypertension: Secondary | ICD-10-CM | POA: Diagnosis not present

## 2012-10-01 DIAGNOSIS — J441 Chronic obstructive pulmonary disease with (acute) exacerbation: Secondary | ICD-10-CM | POA: Diagnosis not present

## 2012-10-01 DIAGNOSIS — Z8674 Personal history of sudden cardiac arrest: Secondary | ICD-10-CM | POA: Diagnosis not present

## 2012-10-01 DIAGNOSIS — Z45018 Encounter for adjustment and management of other part of cardiac pacemaker: Secondary | ICD-10-CM | POA: Diagnosis not present

## 2012-10-02 DIAGNOSIS — J441 Chronic obstructive pulmonary disease with (acute) exacerbation: Secondary | ICD-10-CM | POA: Diagnosis not present

## 2012-10-02 DIAGNOSIS — E538 Deficiency of other specified B group vitamins: Secondary | ICD-10-CM | POA: Diagnosis not present

## 2012-10-02 DIAGNOSIS — I10 Essential (primary) hypertension: Secondary | ICD-10-CM | POA: Diagnosis not present

## 2012-10-02 DIAGNOSIS — Z45018 Encounter for adjustment and management of other part of cardiac pacemaker: Secondary | ICD-10-CM | POA: Diagnosis not present

## 2012-10-02 DIAGNOSIS — Z8674 Personal history of sudden cardiac arrest: Secondary | ICD-10-CM | POA: Diagnosis not present

## 2012-10-02 DIAGNOSIS — IMO0001 Reserved for inherently not codable concepts without codable children: Secondary | ICD-10-CM | POA: Diagnosis not present

## 2012-10-03 DIAGNOSIS — Z45018 Encounter for adjustment and management of other part of cardiac pacemaker: Secondary | ICD-10-CM | POA: Diagnosis not present

## 2012-10-03 DIAGNOSIS — J441 Chronic obstructive pulmonary disease with (acute) exacerbation: Secondary | ICD-10-CM | POA: Diagnosis not present

## 2012-10-03 DIAGNOSIS — Z8674 Personal history of sudden cardiac arrest: Secondary | ICD-10-CM | POA: Diagnosis not present

## 2012-10-03 DIAGNOSIS — IMO0001 Reserved for inherently not codable concepts without codable children: Secondary | ICD-10-CM | POA: Diagnosis not present

## 2012-10-03 DIAGNOSIS — I10 Essential (primary) hypertension: Secondary | ICD-10-CM | POA: Diagnosis not present

## 2012-10-06 DIAGNOSIS — Z8674 Personal history of sudden cardiac arrest: Secondary | ICD-10-CM | POA: Diagnosis not present

## 2012-10-06 DIAGNOSIS — I10 Essential (primary) hypertension: Secondary | ICD-10-CM | POA: Diagnosis not present

## 2012-10-06 DIAGNOSIS — Z45018 Encounter for adjustment and management of other part of cardiac pacemaker: Secondary | ICD-10-CM | POA: Diagnosis not present

## 2012-10-06 DIAGNOSIS — IMO0001 Reserved for inherently not codable concepts without codable children: Secondary | ICD-10-CM | POA: Diagnosis not present

## 2012-10-06 DIAGNOSIS — J441 Chronic obstructive pulmonary disease with (acute) exacerbation: Secondary | ICD-10-CM | POA: Diagnosis not present

## 2012-10-08 DIAGNOSIS — Z45018 Encounter for adjustment and management of other part of cardiac pacemaker: Secondary | ICD-10-CM | POA: Diagnosis not present

## 2012-10-08 DIAGNOSIS — I10 Essential (primary) hypertension: Secondary | ICD-10-CM | POA: Diagnosis not present

## 2012-10-08 DIAGNOSIS — IMO0001 Reserved for inherently not codable concepts without codable children: Secondary | ICD-10-CM | POA: Diagnosis not present

## 2012-10-08 DIAGNOSIS — Z8674 Personal history of sudden cardiac arrest: Secondary | ICD-10-CM | POA: Diagnosis not present

## 2012-10-08 DIAGNOSIS — J441 Chronic obstructive pulmonary disease with (acute) exacerbation: Secondary | ICD-10-CM | POA: Diagnosis not present

## 2012-10-10 DIAGNOSIS — IMO0001 Reserved for inherently not codable concepts without codable children: Secondary | ICD-10-CM | POA: Diagnosis not present

## 2012-10-10 DIAGNOSIS — Z8674 Personal history of sudden cardiac arrest: Secondary | ICD-10-CM | POA: Diagnosis not present

## 2012-10-10 DIAGNOSIS — J441 Chronic obstructive pulmonary disease with (acute) exacerbation: Secondary | ICD-10-CM | POA: Diagnosis not present

## 2012-10-10 DIAGNOSIS — I10 Essential (primary) hypertension: Secondary | ICD-10-CM | POA: Diagnosis not present

## 2012-10-10 DIAGNOSIS — Z45018 Encounter for adjustment and management of other part of cardiac pacemaker: Secondary | ICD-10-CM | POA: Diagnosis not present

## 2012-10-12 DIAGNOSIS — IMO0001 Reserved for inherently not codable concepts without codable children: Secondary | ICD-10-CM | POA: Diagnosis not present

## 2012-10-12 DIAGNOSIS — Z8674 Personal history of sudden cardiac arrest: Secondary | ICD-10-CM | POA: Diagnosis not present

## 2012-10-12 DIAGNOSIS — I10 Essential (primary) hypertension: Secondary | ICD-10-CM | POA: Diagnosis not present

## 2012-10-12 DIAGNOSIS — J441 Chronic obstructive pulmonary disease with (acute) exacerbation: Secondary | ICD-10-CM | POA: Diagnosis not present

## 2012-10-12 DIAGNOSIS — Z45018 Encounter for adjustment and management of other part of cardiac pacemaker: Secondary | ICD-10-CM | POA: Diagnosis not present

## 2012-10-14 DIAGNOSIS — IMO0001 Reserved for inherently not codable concepts without codable children: Secondary | ICD-10-CM | POA: Diagnosis not present

## 2012-10-14 DIAGNOSIS — I10 Essential (primary) hypertension: Secondary | ICD-10-CM | POA: Diagnosis not present

## 2012-10-14 DIAGNOSIS — Z8674 Personal history of sudden cardiac arrest: Secondary | ICD-10-CM | POA: Diagnosis not present

## 2012-10-14 DIAGNOSIS — Z45018 Encounter for adjustment and management of other part of cardiac pacemaker: Secondary | ICD-10-CM | POA: Diagnosis not present

## 2012-10-14 DIAGNOSIS — J441 Chronic obstructive pulmonary disease with (acute) exacerbation: Secondary | ICD-10-CM | POA: Diagnosis not present

## 2012-10-15 ENCOUNTER — Other Ambulatory Visit: Payer: Self-pay | Admitting: *Deleted

## 2012-10-15 DIAGNOSIS — I10 Essential (primary) hypertension: Secondary | ICD-10-CM | POA: Diagnosis not present

## 2012-10-15 DIAGNOSIS — Z8674 Personal history of sudden cardiac arrest: Secondary | ICD-10-CM | POA: Diagnosis not present

## 2012-10-15 DIAGNOSIS — IMO0001 Reserved for inherently not codable concepts without codable children: Secondary | ICD-10-CM | POA: Diagnosis not present

## 2012-10-15 DIAGNOSIS — J441 Chronic obstructive pulmonary disease with (acute) exacerbation: Secondary | ICD-10-CM | POA: Diagnosis not present

## 2012-10-15 DIAGNOSIS — Z45018 Encounter for adjustment and management of other part of cardiac pacemaker: Secondary | ICD-10-CM | POA: Diagnosis not present

## 2012-10-15 NOTE — Telephone Encounter (Signed)
Faxed completed documentation of face-to-face encounter to [facility] on 10/15/12

## 2012-10-16 ENCOUNTER — Encounter: Payer: Self-pay | Admitting: Internal Medicine

## 2012-10-20 ENCOUNTER — Encounter: Payer: Self-pay | Admitting: Internal Medicine

## 2012-10-20 DIAGNOSIS — H52229 Regular astigmatism, unspecified eye: Secondary | ICD-10-CM | POA: Diagnosis not present

## 2012-10-20 DIAGNOSIS — H353190 Nonexudative age-related macular degeneration, unspecified eye, stage unspecified: Secondary | ICD-10-CM | POA: Diagnosis not present

## 2012-10-20 DIAGNOSIS — E119 Type 2 diabetes mellitus without complications: Secondary | ICD-10-CM | POA: Diagnosis not present

## 2012-10-20 DIAGNOSIS — H5210 Myopia, unspecified eye: Secondary | ICD-10-CM | POA: Diagnosis not present

## 2012-10-21 DIAGNOSIS — IMO0001 Reserved for inherently not codable concepts without codable children: Secondary | ICD-10-CM | POA: Diagnosis not present

## 2012-10-21 DIAGNOSIS — Z45018 Encounter for adjustment and management of other part of cardiac pacemaker: Secondary | ICD-10-CM | POA: Diagnosis not present

## 2012-10-21 DIAGNOSIS — Z8674 Personal history of sudden cardiac arrest: Secondary | ICD-10-CM | POA: Diagnosis not present

## 2012-10-21 DIAGNOSIS — I10 Essential (primary) hypertension: Secondary | ICD-10-CM | POA: Diagnosis not present

## 2012-10-21 DIAGNOSIS — J441 Chronic obstructive pulmonary disease with (acute) exacerbation: Secondary | ICD-10-CM | POA: Diagnosis not present

## 2012-10-23 DIAGNOSIS — I10 Essential (primary) hypertension: Secondary | ICD-10-CM | POA: Diagnosis not present

## 2012-10-23 DIAGNOSIS — Z45018 Encounter for adjustment and management of other part of cardiac pacemaker: Secondary | ICD-10-CM | POA: Diagnosis not present

## 2012-10-23 DIAGNOSIS — IMO0001 Reserved for inherently not codable concepts without codable children: Secondary | ICD-10-CM | POA: Diagnosis not present

## 2012-10-23 DIAGNOSIS — Z8674 Personal history of sudden cardiac arrest: Secondary | ICD-10-CM | POA: Diagnosis not present

## 2012-10-23 DIAGNOSIS — J441 Chronic obstructive pulmonary disease with (acute) exacerbation: Secondary | ICD-10-CM | POA: Diagnosis not present

## 2012-10-30 DIAGNOSIS — H4011X0 Primary open-angle glaucoma, stage unspecified: Secondary | ICD-10-CM | POA: Diagnosis not present

## 2012-10-30 DIAGNOSIS — H353190 Nonexudative age-related macular degeneration, unspecified eye, stage unspecified: Secondary | ICD-10-CM | POA: Diagnosis not present

## 2012-10-30 DIAGNOSIS — H4010X0 Unspecified open-angle glaucoma, stage unspecified: Secondary | ICD-10-CM | POA: Diagnosis not present

## 2012-10-30 DIAGNOSIS — H409 Unspecified glaucoma: Secondary | ICD-10-CM | POA: Diagnosis not present

## 2012-11-04 DIAGNOSIS — Z23 Encounter for immunization: Secondary | ICD-10-CM | POA: Diagnosis not present

## 2012-11-04 DIAGNOSIS — E538 Deficiency of other specified B group vitamins: Secondary | ICD-10-CM | POA: Diagnosis not present

## 2012-11-04 DIAGNOSIS — R609 Edema, unspecified: Secondary | ICD-10-CM | POA: Diagnosis not present

## 2012-11-04 DIAGNOSIS — IMO0002 Reserved for concepts with insufficient information to code with codable children: Secondary | ICD-10-CM | POA: Diagnosis not present

## 2012-11-04 DIAGNOSIS — J449 Chronic obstructive pulmonary disease, unspecified: Secondary | ICD-10-CM | POA: Diagnosis not present

## 2012-11-04 DIAGNOSIS — Z950 Presence of cardiac pacemaker: Secondary | ICD-10-CM | POA: Diagnosis not present

## 2012-11-13 DIAGNOSIS — H4011X0 Primary open-angle glaucoma, stage unspecified: Secondary | ICD-10-CM | POA: Diagnosis not present

## 2012-11-13 DIAGNOSIS — H409 Unspecified glaucoma: Secondary | ICD-10-CM | POA: Diagnosis not present

## 2012-11-24 DIAGNOSIS — N3942 Incontinence without sensory awareness: Secondary | ICD-10-CM | POA: Diagnosis not present

## 2012-11-24 DIAGNOSIS — N3946 Mixed incontinence: Secondary | ICD-10-CM | POA: Diagnosis not present

## 2012-11-24 DIAGNOSIS — N3020 Other chronic cystitis without hematuria: Secondary | ICD-10-CM | POA: Diagnosis not present

## 2012-12-08 DIAGNOSIS — K219 Gastro-esophageal reflux disease without esophagitis: Secondary | ICD-10-CM | POA: Diagnosis not present

## 2012-12-08 DIAGNOSIS — E559 Vitamin D deficiency, unspecified: Secondary | ICD-10-CM | POA: Diagnosis not present

## 2012-12-08 DIAGNOSIS — E785 Hyperlipidemia, unspecified: Secondary | ICD-10-CM | POA: Diagnosis not present

## 2012-12-08 DIAGNOSIS — F329 Major depressive disorder, single episode, unspecified: Secondary | ICD-10-CM | POA: Diagnosis not present

## 2012-12-08 DIAGNOSIS — E538 Deficiency of other specified B group vitamins: Secondary | ICD-10-CM | POA: Diagnosis not present

## 2012-12-08 DIAGNOSIS — D509 Iron deficiency anemia, unspecified: Secondary | ICD-10-CM | POA: Diagnosis not present

## 2012-12-08 DIAGNOSIS — E119 Type 2 diabetes mellitus without complications: Secondary | ICD-10-CM | POA: Diagnosis not present

## 2012-12-22 ENCOUNTER — Encounter: Payer: Medicare Other | Admitting: Internal Medicine

## 2012-12-25 ENCOUNTER — Encounter: Payer: Self-pay | Admitting: *Deleted

## 2012-12-26 ENCOUNTER — Encounter: Payer: Medicare Other | Admitting: Internal Medicine

## 2012-12-29 ENCOUNTER — Ambulatory Visit (INDEPENDENT_AMBULATORY_CARE_PROVIDER_SITE_OTHER): Payer: Medicare Other | Admitting: Internal Medicine

## 2012-12-29 ENCOUNTER — Encounter: Payer: Self-pay | Admitting: Internal Medicine

## 2012-12-29 VITALS — BP 160/74 | HR 80 | Ht 64.0 in | Wt 140.0 lb

## 2012-12-29 DIAGNOSIS — I10 Essential (primary) hypertension: Secondary | ICD-10-CM | POA: Diagnosis not present

## 2012-12-29 DIAGNOSIS — Z950 Presence of cardiac pacemaker: Secondary | ICD-10-CM | POA: Diagnosis not present

## 2012-12-29 DIAGNOSIS — I442 Atrioventricular block, complete: Secondary | ICD-10-CM | POA: Diagnosis not present

## 2012-12-29 NOTE — Progress Notes (Signed)
HPI Wendy Velasquez returns today for followup. She is a very pleasant 79 year old woman with symptomatic tachycardia bradycardia syndrome, status post permanent pacemaker insertion secondary to long pauses. She has done well in the interim. She denies chest pain, shortness of breath, or syncope. She states that she does not even know that her pacemaker is present. No Known Allergies   Current Outpatient Prescriptions  Medication Sig Dispense Refill  . albuterol-ipratropium (COMBIVENT) 18-103 MCG/ACT inhaler Inhale 2 puffs into the lungs every 6 (six) hours as needed for wheezing.      . bimatoprost (LUMIGAN) 0.01 % SOLN Apply 1 drop to eye at bedtime.      Marland Kitchen buPROPion (WELLBUTRIN) 100 MG tablet Take 100 mg by mouth daily.      Marland Kitchen esomeprazole (NEXIUM) 40 MG capsule Take 40 mg by mouth daily before breakfast.      . Fluticasone Furoate-Vilanterol (BREO ELLIPTA) 100-25 MCG/INH AEPB Inhale 1 puff into the lungs daily.      Marland Kitchen gabapentin (NEURONTIN) 100 MG capsule Take 200 mg by mouth at bedtime.      . insulin glargine (LANTUS) 100 UNIT/ML injection Inject 10-12 Units into the skin at bedtime.      . metFORMIN (GLUCOPHAGE) 1000 MG tablet Take 1,000 mg by mouth 2 (two) times daily with a meal.      . Nebulizer MISC Inhale 1 ampule into the lungs every 6 (six) hours as needed (wheezing or shortness of breath).      . sertraline (ZOLOFT) 50 MG tablet Take 50 mg by mouth 2 (two) times daily.      . simvastatin (ZOCOR) 10 MG tablet Take 10 mg by mouth at bedtime.       No current facility-administered medications for this visit.     Past Medical History  Diagnosis Date  . Hypertension   . COPD (chronic obstructive pulmonary disease)   . Diabetes mellitus without complication   . Hypercholesterolemia   . GERD (gastroesophageal reflux disease)   . Pacemaker 09/11/2012    dual chamber  . Dysrhythmia 09/11/2012    complete heart block   with CPR  . Asthma     ROS:   All systems  reviewed and negative except as noted in the HPI.   Past Surgical History  Procedure Laterality Date  . Interstim implant placement    . Insert / replace / remove pacemaker  09/11/2012    DUAL CHAMBER  DR Ladona Ridgel  . Abdominal hysterectomy       No family history on file.   History   Social History  . Marital Status: Married    Spouse Name: N/A    Number of Children: N/A  . Years of Education: N/A   Occupational History  . Not on file.   Social History Main Topics  . Smoking status: Never Smoker   . Smokeless tobacco: Never Used  . Alcohol Use: No  . Drug Use: No  . Sexual Activity: Not on file   Other Topics Concern  . Not on file   Social History Narrative  . No narrative on file     BP 160/74  Pulse 80  Ht 5\' 4"  (1.626 m)  Wt 140 lb (63.504 kg)  BMI 24.02 kg/m2  Physical Exam:  Well appearing NAD HEENT: Unremarkable Neck:  No JVD, no thyromegally Back:  No CVA tenderness Lungs:  Clear with no wheezes, rales, or rhonchi. Well-healed pacemaker incision. HEART:  Regular rate rhythm, no  murmurs, no rubs, no clicks Abd:  soft, positive bowel sounds, no organomegally, no rebound, no guarding Ext:  2 plus pulses, no edema, no cyanosis, no clubbing Skin:  No rashes no nodules Neuro:  CN II through XII intact, motor grossly intact  EKG  DEVICE  Normal device function.  See PaceArt for details.   Assess/Plan:

## 2012-12-29 NOTE — Patient Instructions (Signed)
Your physician wants you to follow-up in: 09/2013 with Dr Court Joy will receive a reminder letter in the mail two months in advance. If you don't receive a letter, please call our office to schedule the follow-up appointment.

## 2012-12-29 NOTE — Assessment & Plan Note (Signed)
Her St. Jude dual-chamber pacemaker is working normally. We'll plan to recheck in several months. 

## 2012-12-29 NOTE — Assessment & Plan Note (Signed)
On my check today, the blood pressure was 140/70. She had previously had severe hypotension on beta blockers and diuretics. I recommended that she continue to have a slightly elevated blood-pressure So as to avoid hypotension.

## 2012-12-30 LAB — PACEMAKER DEVICE OBSERVATION
ATRIAL PACING PM: 1
BAMS-0001: 150 {beats}/min
BAMS-0003: 70 {beats}/min
BATTERY VOLTAGE: 2.9779 V
VENTRICULAR PACING PM: 99

## 2013-01-06 DIAGNOSIS — E538 Deficiency of other specified B group vitamins: Secondary | ICD-10-CM | POA: Diagnosis not present

## 2013-03-10 DIAGNOSIS — E538 Deficiency of other specified B group vitamins: Secondary | ICD-10-CM | POA: Diagnosis not present

## 2013-03-17 DIAGNOSIS — H409 Unspecified glaucoma: Secondary | ICD-10-CM | POA: Diagnosis not present

## 2013-03-17 DIAGNOSIS — H4011X0 Primary open-angle glaucoma, stage unspecified: Secondary | ICD-10-CM | POA: Diagnosis not present

## 2013-03-20 DIAGNOSIS — Z950 Presence of cardiac pacemaker: Secondary | ICD-10-CM | POA: Diagnosis not present

## 2013-03-20 DIAGNOSIS — E538 Deficiency of other specified B group vitamins: Secondary | ICD-10-CM | POA: Diagnosis not present

## 2013-03-20 DIAGNOSIS — IMO0001 Reserved for inherently not codable concepts without codable children: Secondary | ICD-10-CM | POA: Diagnosis not present

## 2013-03-20 DIAGNOSIS — D509 Iron deficiency anemia, unspecified: Secondary | ICD-10-CM | POA: Diagnosis not present

## 2013-03-20 DIAGNOSIS — Z1331 Encounter for screening for depression: Secondary | ICD-10-CM | POA: Diagnosis not present

## 2013-03-20 DIAGNOSIS — R609 Edema, unspecified: Secondary | ICD-10-CM | POA: Diagnosis not present

## 2013-03-20 DIAGNOSIS — E785 Hyperlipidemia, unspecified: Secondary | ICD-10-CM | POA: Diagnosis not present

## 2013-03-20 DIAGNOSIS — M810 Age-related osteoporosis without current pathological fracture: Secondary | ICD-10-CM | POA: Diagnosis not present

## 2013-03-20 DIAGNOSIS — E559 Vitamin D deficiency, unspecified: Secondary | ICD-10-CM | POA: Diagnosis not present

## 2013-03-25 ENCOUNTER — Other Ambulatory Visit: Payer: Self-pay | Admitting: Internal Medicine

## 2013-03-25 DIAGNOSIS — Z0000 Encounter for general adult medical examination without abnormal findings: Secondary | ICD-10-CM | POA: Diagnosis not present

## 2013-03-25 DIAGNOSIS — D509 Iron deficiency anemia, unspecified: Secondary | ICD-10-CM | POA: Diagnosis not present

## 2013-03-26 ENCOUNTER — Ambulatory Visit
Admission: RE | Admit: 2013-03-26 | Discharge: 2013-03-26 | Disposition: A | Discharge status: Home / Self Care | Payer: Medicare Other | Source: Ambulatory Visit | Attending: Internal Medicine | Admitting: Internal Medicine

## 2013-03-26 DIAGNOSIS — N3289 Other specified disorders of bladder: Secondary | ICD-10-CM | POA: Diagnosis not present

## 2013-03-26 DIAGNOSIS — D509 Iron deficiency anemia, unspecified: Secondary | ICD-10-CM | POA: Diagnosis not present

## 2013-03-26 MED ORDER — IOHEXOL 300 MG/ML  SOLN
100.0000 mL | Freq: Once | INTRAMUSCULAR | Status: AC | PRN
  Administered 2013-03-26: 100 mL via INTRAVENOUS

## 2013-03-27 DIAGNOSIS — N3020 Other chronic cystitis without hematuria: Secondary | ICD-10-CM | POA: Diagnosis not present

## 2013-03-27 DIAGNOSIS — N3946 Mixed incontinence: Secondary | ICD-10-CM | POA: Diagnosis not present

## 2013-03-27 DIAGNOSIS — N390 Urinary tract infection, site not specified: Secondary | ICD-10-CM | POA: Diagnosis not present

## 2013-03-30 ENCOUNTER — Ambulatory Visit: Payer: Medicare Other | Admitting: Internal Medicine

## 2013-04-02 DIAGNOSIS — N3020 Other chronic cystitis without hematuria: Secondary | ICD-10-CM | POA: Diagnosis not present

## 2013-04-02 DIAGNOSIS — N390 Urinary tract infection, site not specified: Secondary | ICD-10-CM | POA: Diagnosis not present

## 2013-04-03 DIAGNOSIS — N3020 Other chronic cystitis without hematuria: Secondary | ICD-10-CM | POA: Diagnosis not present

## 2013-04-03 DIAGNOSIS — N3946 Mixed incontinence: Secondary | ICD-10-CM | POA: Diagnosis not present

## 2013-04-09 DIAGNOSIS — E538 Deficiency of other specified B group vitamins: Secondary | ICD-10-CM | POA: Diagnosis not present

## 2013-04-10 ENCOUNTER — Encounter (HOSPITAL_COMMUNITY)
Admission: RE | Admit: 2013-04-10 | Discharge: 2013-04-10 | Disposition: A | Discharge status: Home / Self Care | Payer: Medicare Other | Source: Ambulatory Visit | Attending: Internal Medicine | Admitting: Internal Medicine

## 2013-04-10 ENCOUNTER — Other Ambulatory Visit (HOSPITAL_COMMUNITY): Payer: Self-pay | Admitting: Internal Medicine

## 2013-04-10 ENCOUNTER — Encounter (HOSPITAL_COMMUNITY): Payer: Self-pay

## 2013-04-10 DIAGNOSIS — D649 Anemia, unspecified: Secondary | ICD-10-CM | POA: Diagnosis not present

## 2013-04-10 MED ORDER — SODIUM CHLORIDE 0.9 % IV SOLN
1020.0000 mg | Freq: Once | INTRAVENOUS | Status: AC
  Administered 2013-04-10: 1020 mg via INTRAVENOUS
  Filled 2013-04-10: qty 34

## 2013-04-10 MED ORDER — SODIUM CHLORIDE 0.9 % IV SOLN
Freq: Once | INTRAVENOUS | Status: AC
  Administered 2013-04-10: 14:00:00 via INTRAVENOUS

## 2013-04-10 NOTE — Discharge Instructions (Signed)
Ferumoxytol injection What is this medicine? FERUMOXYTOL is an iron complex. Iron is used to make healthy red blood cells, which carry oxygen and nutrients throughout the body. This medicine is used to treat iron deficiency anemia in people with chronic kidney disease. This medicine may be used for other purposes; ask your health care provider or pharmacist if you have questions. COMMON BRAND NAME(S): Feraheme  What should I tell my health care provider before I take this medicine? They need to know if you have any of these conditions: -anemia not caused by low iron levels -high levels of iron in the blood -magnetic resonance imaging (MRI) test scheduled -an unusual or allergic reaction to iron, other medicines, foods, dyes, or preservatives -pregnant or trying to get pregnant -breast-feeding How should I use this medicine? This medicine is for injection into a vein. It is given by a health care professional in a hospital or clinic setting. Talk to your pediatrician regarding the use of this medicine in children. Special care may be needed. Overdosage: If you think you've taken too much of this medicine contact a poison control center or emergency room at once. Overdosage: If you think you have taken too much of this medicine contact a poison control center or emergency room at once. NOTE: This medicine is only for you. Do not share this medicine with others. What if I miss a dose? It is important not to miss your dose. Call your doctor or health care professional if you are unable to keep an appointment. What may interact with this medicine? This medicine may interact with the following medications: -other iron products This list may not describe all possible interactions. Give your health care provider a list of all the medicines, herbs, non-prescription drugs, or dietary supplements you use. Also tell them if you smoke, drink alcohol, or use illegal drugs. Some items may interact with your  medicine. What should I watch for while using this medicine? Visit your doctor or healthcare professional regularly. Tell your doctor or healthcare professional if your symptoms do not start to get better or if they get worse. You may need blood work done while you are taking this medicine. You may need to follow a special diet. Talk to your doctor. Foods that contain iron include: whole grains/cereals, dried fruits, beans, or peas, leafy green vegetables, and organ meats (liver, kidney). What side effects may I notice from receiving this medicine? Side effects that you should report to your doctor or health care professional as soon as possible: -allergic reactions like skin rash, itching or hives, swelling of the face, lips, or tongue -breathing problems -changes in blood pressure -feeling faint or lightheaded, falls -fever or chills -flushing, sweating, or hot feelings -swelling of the ankles or feet Side effects that usually do not require medical attention (Report these to your doctor or health care professional if they continue or are bothersome.): -diarrhea -headache -nausea, vomiting -stomach pain This list may not describe all possible side effects. Call your doctor for medical advice about side effects. You may report side effects to FDA at 1-800-FDA-1088. Where should I keep my medicine? This drug is given in a hospital or clinic and will not be stored at home. NOTE: This sheet is a summary. It may not cover all possible information. If you have questions about this medicine, talk to your doctor, pharmacist, or health care provider.  2014, Elsevier/Gold Standard. (2011-10-12 15:23:36) Anemia, Nonspecific Anemia is a condition in which the concentration of red blood  cells or hemoglobin in the blood is below normal. Hemoglobin is a substance in red blood cells that carries oxygen to the tissues of the body. Anemia results in not enough oxygen reaching these tissues.  CAUSES    Common causes of anemia include:   Excessive bleeding. Bleeding may be internal or external. This includes excessive bleeding from periods (in women) or from the intestine.   Poor nutrition.   Chronic kidney, thyroid, and liver disease.  Bone marrow disorders that decrease red blood cell production.  Cancer and treatments for cancer.  HIV, AIDS, and their treatments.  Spleen problems that increase red blood cell destruction.  Blood disorders.  Excess destruction of red blood cells due to infection, medicines, and autoimmune disorders. SIGNS AND SYMPTOMS   Minor weakness.   Dizziness.   Headache.  Palpitations.   Shortness of breath, especially with exercise.   Paleness.  Cold sensitivity.  Indigestion.  Nausea.  Difficulty sleeping.  Difficulty concentrating. Symptoms may occur suddenly or they may develop slowly.  DIAGNOSIS  Additional blood tests are often needed. These help your health care provider determine the best treatment. Your health care provider will check your stool for blood and look for other causes of blood loss.  TREATMENT  Treatment varies depending on the cause of the anemia. Treatment can include:   Supplements of iron, vitamin I68, or folic acid.   Hormone medicines.   A blood transfusion. This may be needed if blood loss is severe.   Hospitalization. This may be needed if there is significant continual blood loss.   Dietary changes.  Spleen removal. HOME CARE INSTRUCTIONS Keep all follow-up appointments. It often takes many weeks to correct anemia, and having your health care provider check on your condition and your response to treatment is very important. SEEK IMMEDIATE MEDICAL CARE IF:   You develop extreme weakness, shortness of breath, or chest pain.   You become dizzy or have trouble concentrating.  You develop heavy vaginal bleeding.   You develop a rash.   You have bloody or black, tarry stools.    You faint.   You vomit up blood.   You vomit repeatedly.   You have abdominal pain.  You have a fever or persistent symptoms for more than 2 3 days.   You have a fever and your symptoms suddenly get worse.   You are dehydrated.  MAKE SURE YOU:  Understand these instructions.  Will watch your condition.  Will get help right away if you are not doing well or get worse. Immune Globulin Injection

## 2013-05-12 DIAGNOSIS — E538 Deficiency of other specified B group vitamins: Secondary | ICD-10-CM | POA: Diagnosis not present

## 2013-05-15 DIAGNOSIS — N3946 Mixed incontinence: Secondary | ICD-10-CM | POA: Diagnosis not present

## 2013-05-15 DIAGNOSIS — N3020 Other chronic cystitis without hematuria: Secondary | ICD-10-CM | POA: Diagnosis not present

## 2013-06-17 DIAGNOSIS — IMO0002 Reserved for concepts with insufficient information to code with codable children: Secondary | ICD-10-CM | POA: Diagnosis not present

## 2013-06-17 DIAGNOSIS — J441 Chronic obstructive pulmonary disease with (acute) exacerbation: Secondary | ICD-10-CM | POA: Diagnosis not present

## 2013-06-26 ENCOUNTER — Encounter: Payer: Self-pay | Admitting: Internal Medicine

## 2013-06-26 ENCOUNTER — Ambulatory Visit (INDEPENDENT_AMBULATORY_CARE_PROVIDER_SITE_OTHER): Payer: Medicare Other | Admitting: Internal Medicine

## 2013-06-26 VITALS — BP 138/80 | Ht 63.0 in | Wt 132.2 lb

## 2013-06-26 DIAGNOSIS — E785 Hyperlipidemia, unspecified: Secondary | ICD-10-CM | POA: Diagnosis not present

## 2013-06-26 DIAGNOSIS — I469 Cardiac arrest, cause unspecified: Secondary | ICD-10-CM | POA: Diagnosis not present

## 2013-06-26 DIAGNOSIS — R570 Cardiogenic shock: Secondary | ICD-10-CM

## 2013-06-26 DIAGNOSIS — Z950 Presence of cardiac pacemaker: Secondary | ICD-10-CM

## 2013-06-26 DIAGNOSIS — I10 Essential (primary) hypertension: Secondary | ICD-10-CM

## 2013-06-26 NOTE — Patient Instructions (Signed)
Your physician recommends that you schedule a follow-up appointment in: ONE YEAR with Dr.Hilty   

## 2013-06-26 NOTE — Progress Notes (Signed)
OFFICE NOTE  Chief Complaint:  Hospital follow-up  Primary Care Physician: [name], MD  HPI:  Wendy Velasquez is a 80 y.o. female with a past medical history significant for COPD, HTN, GERD, HPL and weakness. She apparently got up to go to the bathroom recently and passed out. When EMS arrived she was noted to be extremely bradycardic. 12-lead EKG showed complete heart block. She was administered atropine in route and loss consciousness and when she awakened she vomited. External pacing was attempted. Upon arrival to the trauma bay she was noted to be in complete heart block with a slow ventricular response. Ventricular rate was less than 20. She was hypotensive, ashen and poorly perfusing. Mental status was altered. She had external pacing pads applied with apparent capture on high output of a rate of 60. When I arrived in the emergency room, family was at the bedside and the patient was noncommunicative on a nonrebreather. Shortly thereafter her pupils became fixed and dilated and she lost muscle tone. Pulses were checked and barely palpable, and loss of mechanical capture was confirmed. CPR was initiated and 1 mg of epinephrine was administered IV. I had notified Dr. Lovena Le with cardiac electrophysiology and he arrived around this time. We immediately called cardiac arrest and started resuscitation. Dr. Lovena Le secured the airway with intubation and I placed a temporary transvenous pacing from the right femoral vein (see separate procedure note for details). We were able to achieve capture fairly quickly with the temporary pacer. After temporary pacing she stabilized with an improved blood pressure of around 244 systolic, however epinephrine was starting to wear off and her blood pressure had dropped into the 01U to 27O systolic. We had a discussion with the family regarding placement of a permanent pacemaker and they are agreeable to this. She underwent successful pacemaker placement by  Dr. Lovena Le. Her hospitalization was fairly brief afterwards and she recovered quite quickly. Unfortunately she did undergo short period of CPR does have some pain associated with that. He may have aspirated and possibly had a aspiration pneumonitis on top of underlying COPD. She's been on antibiotics for that as well as a urinary tract infection which has been difficult to treat.   [name] returns today and is feeling quite well. She's had no further episodes related to her heart block. Her pacemaker appears to be functioning but was not interrogated in the office. She does have a remote device but has not had a scheduled remote check since placement of the device. She saw Dr. Lovena Le back in October 2014. She denies any chest pain or worsening shortness of breath.  PMHx:  Past Medical History  Diagnosis Date  . Hypertension   . COPD (chronic obstructive pulmonary disease)   . Diabetes mellitus without complication   . Hypercholesterolemia   . GERD (gastroesophageal reflux disease)   . Pacemaker 09/11/2012    dual chamber  . Dysrhythmia 09/11/2012    complete heart block   with CPR  . Asthma   . Anemia     iron def    Past Surgical History  Procedure Laterality Date  . Interstim implant placement    . Insert / replace / remove pacemaker  09/11/2012    DUAL CHAMBER  DR Lovena Le  . Abdominal hysterectomy      FAMHx:  No family history on file.  SOCHx:   reports that she has never smoked. She has never used smokeless tobacco. She reports that she does not drink alcohol  or use illicit drugs.  ALLERGIES:  No Known Allergies  ROS: A comprehensive review of systems was negative.  HOME MEDS: Current Outpatient Prescriptions  Medication Sig Dispense Refill  . albuterol-ipratropium (COMBIVENT) 18-103 MCG/ACT inhaler Inhale 2 puffs into the lungs every 6 (six) hours as needed for wheezing.      Marland Kitchen aspirin EC 81 MG tablet Take 81 mg by mouth daily.      . bimatoprost (LUMIGAN) 0.01  % SOLN Apply 1 drop to eye at bedtime.      . Brinzolamide-Brimonidine (SIMBRINZA) 1-0.2 % SUSP Apply 1 drop to eye at bedtime.      Marland Kitchen buPROPion (WELLBUTRIN) 100 MG tablet Take 100 mg by mouth daily.      . cyanocobalamin (,VITAMIN B-12,) 1000 MCG/ML injection Inject 1,000 mcg into the muscle every 30 (thirty) days.      Marland Kitchen esomeprazole (NEXIUM) 40 MG capsule Take 40 mg by mouth daily before breakfast.      . Fluticasone Furoate-Vilanterol (BREO ELLIPTA) 100-25 MCG/INH AEPB Inhale 1 puff into the lungs daily.      Marland Kitchen gabapentin (NEURONTIN) 100 MG capsule Take 200 mg by mouth at bedtime.      . insulin glargine (LANTUS) 100 UNIT/ML injection Inject 14 Units into the skin at bedtime.       . metFORMIN (GLUCOPHAGE) 1000 MG tablet Take 1,000 mg by mouth 2 (two) times daily with a meal.      . Nebulizer MISC Inhale 1 ampule into the lungs every 6 (six) hours as needed (wheezing or shortness of breath).      . sertraline (ZOLOFT) 50 MG tablet Take 50 mg by mouth 2 (two) times daily.      . simvastatin (ZOCOR) 10 MG tablet Take 10 mg by mouth at bedtime.      . Teriparatide, Recombinant, (FORTEO [place]) Inject into the skin daily.      Marland Kitchen trimethoprim (TRIMPEX) 100 MG tablet Take 100 mg by mouth 2 (two) times daily.       No current facility-administered medications for this visit.    LABS/IMAGING: No results found for this or any previous visit (from the past 48 hour(s)). No results found.  VITALS: BP 138/80  Ht 5\' 3"  (1.6 m)  Wt 132 lb 3.2 oz (59.966 kg)  BMI 23.42 kg/m2  EXAM: General appearance: alert and no distress Neck: no adenopathy, no carotid bruit, no JVD, supple, symmetrical, trachea midline and thyroid not enlarged, symmetric, no tenderness/mass/nodules Lungs: clear to auscultation bilaterally Heart: regular rate and rhythm, S1, S2 normal, no murmur, click, rub or gallop Abdomen: soft, non-tender; bowel sounds normal; no masses,  no organomegaly Extremities: extremities normal,  atraumatic, no cyanosis or edema and Pacemaker site appears well-healed without hematoma or erythema Pulses: 2+ and symmetric Skin: Skin color, texture, turgor normal. No rashes or lesions Neurologic: Grossly normal  EKG: AV sequential pacing at 86  ASSESSMENT: 1. Complete heart block status post dual-chamber St. Jude pacemaker 2. Cardiac arrest-resuscitated 3. COPD 4. Borderline HTN  PLAN: 1.   Mrs. Taliaferro is now completely recovered from her episode of heart block and cardiac arrest. Her pacemaker appears to be functioning normally. She had a full check of the device in October and will need to be scheduled for a remote device check. I've spoken with Tobin Chad in our device clinic to arrange this. She continues to exercise in her assisted living facility. She did have a slight COPD exacerbation a month ago but has recovered. Blood  pressure is within normal limits today.  Plan to see her back annually and she will have followup with Dr. Lovena Le for her [place] pacemaker.  Pixie Casino, MD, [facility] Attending Cardiologist [facility] 06/26/2013, 6:16 PM

## 2013-06-29 DIAGNOSIS — R809 Proteinuria, unspecified: Secondary | ICD-10-CM | POA: Diagnosis not present

## 2013-06-29 DIAGNOSIS — E785 Hyperlipidemia, unspecified: Secondary | ICD-10-CM | POA: Diagnosis not present

## 2013-06-29 DIAGNOSIS — M810 Age-related osteoporosis without current pathological fracture: Secondary | ICD-10-CM | POA: Diagnosis not present

## 2013-06-29 DIAGNOSIS — E1129 Type 2 diabetes mellitus with other diabetic kidney complication: Secondary | ICD-10-CM | POA: Diagnosis not present

## 2013-06-29 DIAGNOSIS — K219 Gastro-esophageal reflux disease without esophagitis: Secondary | ICD-10-CM | POA: Diagnosis not present

## 2013-06-29 DIAGNOSIS — IMO0001 Reserved for inherently not codable concepts without codable children: Secondary | ICD-10-CM | POA: Diagnosis not present

## 2013-06-29 DIAGNOSIS — J441 Chronic obstructive pulmonary disease with (acute) exacerbation: Secondary | ICD-10-CM | POA: Diagnosis not present

## 2013-06-29 DIAGNOSIS — D509 Iron deficiency anemia, unspecified: Secondary | ICD-10-CM | POA: Diagnosis not present

## 2013-06-29 DIAGNOSIS — E538 Deficiency of other specified B group vitamins: Secondary | ICD-10-CM | POA: Diagnosis not present

## 2013-07-14 DIAGNOSIS — H409 Unspecified glaucoma: Secondary | ICD-10-CM | POA: Diagnosis not present

## 2013-07-14 DIAGNOSIS — H4011X0 Primary open-angle glaucoma, stage unspecified: Secondary | ICD-10-CM | POA: Diagnosis not present

## 2013-07-31 DIAGNOSIS — E538 Deficiency of other specified B group vitamins: Secondary | ICD-10-CM | POA: Diagnosis not present

## 2013-08-24 DIAGNOSIS — R82998 Other abnormal findings in urine: Secondary | ICD-10-CM | POA: Diagnosis not present

## 2013-08-24 DIAGNOSIS — R300 Dysuria: Secondary | ICD-10-CM | POA: Diagnosis not present

## 2013-08-24 DIAGNOSIS — R809 Proteinuria, unspecified: Secondary | ICD-10-CM | POA: Diagnosis not present

## 2013-09-01 DIAGNOSIS — E538 Deficiency of other specified B group vitamins: Secondary | ICD-10-CM | POA: Diagnosis not present

## 2013-10-01 ENCOUNTER — Ambulatory Visit (INDEPENDENT_AMBULATORY_CARE_PROVIDER_SITE_OTHER): Payer: Medicare Other | Admitting: Internal Medicine

## 2013-10-01 ENCOUNTER — Encounter: Payer: Self-pay | Admitting: Internal Medicine

## 2013-10-01 VITALS — BP 149/74 | HR 84 | Ht 64.0 in | Wt 138.0 lb

## 2013-10-01 DIAGNOSIS — I10 Essential (primary) hypertension: Secondary | ICD-10-CM

## 2013-10-01 DIAGNOSIS — Z950 Presence of cardiac pacemaker: Secondary | ICD-10-CM | POA: Diagnosis not present

## 2013-10-01 DIAGNOSIS — I442 Atrioventricular block, complete: Secondary | ICD-10-CM

## 2013-10-01 LAB — MDC_IDC_ENUM_SESS_TYPE_INCLINIC
Battery Remaining Longevity: 91.2 mo
Battery Voltage: 2.99 V
Date Time Interrogation Session: 20150723161754
Lead Channel Impedance Value: 450 Ohm
Lead Channel Pacing Threshold Amplitude: 0.5 V
Lead Channel Pacing Threshold Amplitude: 0.5 V
Lead Channel Pacing Threshold Pulse Width: 0.4 ms
Lead Channel Sensing Intrinsic Amplitude: 3.5 mV
Lead Channel Setting Pacing Amplitude: 2 V
MDC IDC MSMT LEADCHNL RA PACING THRESHOLD AMPLITUDE: 0.5 V
MDC IDC MSMT LEADCHNL RA PACING THRESHOLD PULSEWIDTH: 0.4 ms
MDC IDC MSMT LEADCHNL RV IMPEDANCE VALUE: 475 Ohm
MDC IDC MSMT LEADCHNL RV PACING THRESHOLD PULSEWIDTH: 0.4 ms
MDC IDC MSMT LEADCHNL RV SENSING INTR AMPL: 12 mV
MDC IDC PG SERIAL: 7522008
MDC IDC SET LEADCHNL RV PACING AMPLITUDE: 0.75 V
MDC IDC SET LEADCHNL RV PACING PULSEWIDTH: 0.4 ms
MDC IDC SET LEADCHNL RV SENSING SENSITIVITY: 12.5 mV
MDC IDC STAT BRADY RA PERCENT PACED: 0.84 %
MDC IDC STAT BRADY RV PERCENT PACED: 99.81 %

## 2013-10-01 NOTE — Progress Notes (Signed)
HPI [name] returns today for followup. She is a very pleasant 80 year old woman with symptomatic tachycardia bradycardia syndrome and complete heart block, status post permanent pacemaker insertion secondary to long pauses. She has done well in the interim. She denies chest pain, shortness of breath, or syncope. She states that she does not even know that her pacemaker is present. No Known Allergies   Current Outpatient Prescriptions  Medication Sig Dispense Refill  . albuterol-ipratropium (COMBIVENT) 18-103 MCG/ACT inhaler Inhale 2 puffs into the lungs every 6 (six) hours as needed for wheezing.      Marland Kitchen aspirin EC 81 MG tablet Take 81 mg by mouth daily.      . bimatoprost (LUMIGAN) 0.01 % SOLN Apply 1 drop to eye at bedtime.      . Brinzolamide-Brimonidine (SIMBRINZA) 1-0.2 % SUSP Apply 1 drop to eye at bedtime.      Marland Kitchen buPROPion (WELLBUTRIN) 100 MG tablet Take 100 mg by mouth daily.      . cyanocobalamin (,VITAMIN B-12,) 1000 MCG/ML injection Inject 1,000 mcg into the muscle every 30 (thirty) days.      Marland Kitchen esomeprazole (NEXIUM) 40 MG capsule Take 40 mg by mouth daily before breakfast.      . Fluticasone Furoate-Vilanterol (BREO ELLIPTA) 100-25 MCG/INH AEPB Inhale 1 puff into the lungs daily.      Marland Kitchen gabapentin (NEURONTIN) 100 MG capsule Take 200 mg by mouth at bedtime.      . insulin glargine (LANTUS) 100 UNIT/ML injection Inject 14 Units into the skin at bedtime.       . metFORMIN (GLUCOPHAGE) 1000 MG tablet Take 1,000 mg by mouth 2 (two) times daily with a meal.      . Nebulizer MISC Inhale 1 ampule into the lungs every 6 (six) hours as needed (wheezing or shortness of breath).      . sertraline (ZOLOFT) 50 MG tablet Take 50 mg by mouth 2 (two) times daily.      . simvastatin (ZOCOR) 10 MG tablet Take 10 mg by mouth at bedtime.      . Teriparatide, Recombinant, (FORTEO [place]) Inject into the skin daily.      Marland Kitchen trimethoprim (TRIMPEX) 100 MG tablet Take 100 mg by mouth daily.          No current facility-administered medications for this visit.     Past Medical History  Diagnosis Date  . Hypertension   . COPD (chronic obstructive pulmonary disease)   . Diabetes mellitus without complication   . Hypercholesterolemia   . GERD (gastroesophageal reflux disease)   . Pacemaker 09/11/2012    dual chamber  . Dysrhythmia 09/11/2012    complete heart block   with CPR  . Asthma   . Anemia     iron def    ROS:   All systems reviewed and negative except as noted in the HPI.   Past Surgical History  Procedure Laterality Date  . Interstim implant placement    . Insert / replace / remove pacemaker  09/11/2012    DUAL CHAMBER  DR Lovena Le  . Abdominal hysterectomy       No family history on file.   History   Social History  . Marital Status: Married    Spouse Name: N/A    Number of Children: N/A  . Years of Education: N/A   Occupational History  . Not on file.   Social History Main Topics  . Smoking status: Never Smoker   .  Smokeless tobacco: Never Used  . Alcohol Use: No  . Drug Use: No  . Sexual Activity: Not on file   Other Topics Concern  . Not on file   Social History Narrative  . No narrative on file     BP 149/74  Pulse 84  Ht 5\' 4"  (1.626 m)  Wt 138 lb (62.596 kg)  BMI 23.68 kg/m2  Physical Exam:  Well appearing elderly woman, NAD HEENT: Unremarkable Neck:  No JVD, no thyromegally Back:  No CVA tenderness Lungs:  Clear with no wheezes, rales, or rhonchi. Well-healed pacemaker incision. HEART:  Regular rate rhythm, no murmurs, no rubs, no clicks Abd:  soft, positive bowel sounds, no organomegally, no rebound, no guarding Ext:  2 plus pulses, no edema, no cyanosis, no clubbing Skin:  No rashes no nodules Neuro:  CN II through XII intact, motor grossly intact  DEVICE  Normal device function.  See PaceArt for details.   Assess/Plan:

## 2013-10-01 NOTE — Patient Instructions (Signed)
Your physician wants you to follow-up in: 12 months with Dr Knox Saliva will receive a reminder letter in the mail two months in advance. If you don't receive a letter, please call our office to schedule the follow-up appointment.  Remote monitoring is used to monitor your Pacemaker of ICD from home. This monitoring reduces the number of office visits required to check your device to one time per year. It allows [place] to keep an eye on the functioning of your device to ensure it is working properly. You are scheduled for a device check from home on 01/04/14. You may send your transmission at any time that day. If you have a wireless device, the transmission will be sent automatically. After your physician reviews your transmission, you will receive a postcard with your next transmission date.

## 2013-10-01 NOTE — Assessment & Plan Note (Signed)
Her St. Jude device is working normally. Will recheck in several months. 

## 2013-10-01 NOTE — Assessment & Plan Note (Addendum)
Her blood pressure is slightly elevated. Will recheck in several months. She is encouraged to maintain a low sodium diet.

## 2013-10-05 DIAGNOSIS — E785 Hyperlipidemia, unspecified: Secondary | ICD-10-CM | POA: Diagnosis not present

## 2013-10-05 DIAGNOSIS — K219 Gastro-esophageal reflux disease without esophagitis: Secondary | ICD-10-CM | POA: Diagnosis not present

## 2013-10-05 DIAGNOSIS — M810 Age-related osteoporosis without current pathological fracture: Secondary | ICD-10-CM | POA: Diagnosis not present

## 2013-10-05 DIAGNOSIS — E559 Vitamin D deficiency, unspecified: Secondary | ICD-10-CM | POA: Diagnosis not present

## 2013-10-05 DIAGNOSIS — E538 Deficiency of other specified B group vitamins: Secondary | ICD-10-CM | POA: Diagnosis not present

## 2013-10-05 DIAGNOSIS — D509 Iron deficiency anemia, unspecified: Secondary | ICD-10-CM | POA: Diagnosis not present

## 2013-10-05 DIAGNOSIS — E1129 Type 2 diabetes mellitus with other diabetic kidney complication: Secondary | ICD-10-CM | POA: Diagnosis not present

## 2013-10-05 DIAGNOSIS — IMO0001 Reserved for inherently not codable concepts without codable children: Secondary | ICD-10-CM | POA: Diagnosis not present

## 2013-11-06 DIAGNOSIS — Z23 Encounter for immunization: Secondary | ICD-10-CM | POA: Diagnosis not present

## 2013-11-12 DIAGNOSIS — R300 Dysuria: Secondary | ICD-10-CM | POA: Diagnosis not present

## 2013-11-12 DIAGNOSIS — R809 Proteinuria, unspecified: Secondary | ICD-10-CM | POA: Diagnosis not present

## 2013-11-12 DIAGNOSIS — R82998 Other abnormal findings in urine: Secondary | ICD-10-CM | POA: Diagnosis not present

## 2013-11-17 DIAGNOSIS — H4010X0 Unspecified open-angle glaucoma, stage unspecified: Secondary | ICD-10-CM | POA: Diagnosis not present

## 2013-11-17 DIAGNOSIS — H4011X0 Primary open-angle glaucoma, stage unspecified: Secondary | ICD-10-CM | POA: Diagnosis not present

## 2013-11-17 DIAGNOSIS — H409 Unspecified glaucoma: Secondary | ICD-10-CM | POA: Diagnosis not present

## 2014-01-04 ENCOUNTER — Ambulatory Visit (INDEPENDENT_AMBULATORY_CARE_PROVIDER_SITE_OTHER): Payer: Medicare Other | Admitting: *Deleted

## 2014-01-04 DIAGNOSIS — I442 Atrioventricular block, complete: Secondary | ICD-10-CM | POA: Diagnosis not present

## 2014-01-04 DIAGNOSIS — E1129 Type 2 diabetes mellitus with other diabetic kidney complication: Secondary | ICD-10-CM | POA: Diagnosis not present

## 2014-01-04 DIAGNOSIS — Z6823 Body mass index (BMI) 23.0-23.9, adult: Secondary | ICD-10-CM | POA: Diagnosis not present

## 2014-01-04 DIAGNOSIS — Z23 Encounter for immunization: Secondary | ICD-10-CM | POA: Diagnosis not present

## 2014-01-04 LAB — MDC_IDC_ENUM_SESS_TYPE_REMOTE
Brady Statistic AP VP Percent: 1 %
Brady Statistic AP VS Percent: 1 %
Brady Statistic AS VP Percent: 99 %
Brady Statistic AS VS Percent: 1 %
Brady Statistic RV Percent Paced: 99 %
Implantable Pulse Generator Model: 2240
Implantable Pulse Generator Serial Number: 7522008
Lead Channel Impedance Value: 450 Ohm
Lead Channel Pacing Threshold Amplitude: 0.625 V
Lead Channel Pacing Threshold Pulse Width: 0.4 ms
Lead Channel Sensing Intrinsic Amplitude: 12 mV
Lead Channel Setting Pacing Amplitude: 2 V
MDC IDC MSMT BATTERY REMAINING LONGEVITY: 82 mo
MDC IDC MSMT BATTERY REMAINING PERCENTAGE: 79 %
MDC IDC MSMT BATTERY VOLTAGE: 2.96 V
MDC IDC MSMT LEADCHNL RA IMPEDANCE VALUE: 440 Ohm
MDC IDC MSMT LEADCHNL RA PACING THRESHOLD AMPLITUDE: 0.5 V
MDC IDC MSMT LEADCHNL RA SENSING INTR AMPL: 3.3 mV
MDC IDC MSMT LEADCHNL RV PACING THRESHOLD PULSEWIDTH: 0.4 ms
MDC IDC SESS DTM: 20151026080016
MDC IDC SET LEADCHNL RV PACING AMPLITUDE: 0.875
MDC IDC SET LEADCHNL RV PACING PULSEWIDTH: 0.4 ms
MDC IDC SET LEADCHNL RV SENSING SENSITIVITY: 12.5 mV
MDC IDC STAT BRADY RA PERCENT PACED: 1 %

## 2014-01-04 NOTE — Progress Notes (Signed)
Remote pacemaker transmission.   

## 2014-01-28 ENCOUNTER — Encounter: Payer: Self-pay | Admitting: Cardiology

## 2014-02-02 ENCOUNTER — Encounter: Payer: Self-pay | Admitting: Internal Medicine

## 2014-02-18 ENCOUNTER — Encounter (HOSPITAL_COMMUNITY): Payer: Self-pay | Admitting: Internal Medicine

## 2014-03-16 DIAGNOSIS — H524 Presbyopia: Secondary | ICD-10-CM | POA: Diagnosis not present

## 2014-03-16 DIAGNOSIS — H52223 Regular astigmatism, bilateral: Secondary | ICD-10-CM | POA: Diagnosis not present

## 2014-03-16 DIAGNOSIS — H3530 Unspecified macular degeneration: Secondary | ICD-10-CM | POA: Diagnosis not present

## 2014-03-16 DIAGNOSIS — H5213 Myopia, bilateral: Secondary | ICD-10-CM | POA: Diagnosis not present

## 2014-03-16 DIAGNOSIS — E119 Type 2 diabetes mellitus without complications: Secondary | ICD-10-CM | POA: Diagnosis not present

## 2014-03-19 DIAGNOSIS — J441 Chronic obstructive pulmonary disease with (acute) exacerbation: Secondary | ICD-10-CM | POA: Diagnosis not present

## 2014-03-19 DIAGNOSIS — Z6824 Body mass index (BMI) 24.0-24.9, adult: Secondary | ICD-10-CM | POA: Diagnosis not present

## 2014-03-19 DIAGNOSIS — J040 Acute laryngitis: Secondary | ICD-10-CM | POA: Diagnosis not present

## 2014-03-19 DIAGNOSIS — J329 Chronic sinusitis, unspecified: Secondary | ICD-10-CM | POA: Diagnosis not present

## 2014-03-25 ENCOUNTER — Encounter (INDEPENDENT_AMBULATORY_CARE_PROVIDER_SITE_OTHER): Payer: Medicare Other | Admitting: Ophthalmology

## 2014-03-25 DIAGNOSIS — H35033 Hypertensive retinopathy, bilateral: Secondary | ICD-10-CM

## 2014-03-25 DIAGNOSIS — H3531 Nonexudative age-related macular degeneration: Secondary | ICD-10-CM

## 2014-03-25 DIAGNOSIS — I10 Essential (primary) hypertension: Secondary | ICD-10-CM

## 2014-03-25 DIAGNOSIS — H43813 Vitreous degeneration, bilateral: Secondary | ICD-10-CM | POA: Diagnosis not present

## 2014-03-25 DIAGNOSIS — E11319 Type 2 diabetes mellitus with unspecified diabetic retinopathy without macular edema: Secondary | ICD-10-CM

## 2014-03-26 DIAGNOSIS — J45901 Unspecified asthma with (acute) exacerbation: Secondary | ICD-10-CM | POA: Diagnosis not present

## 2014-04-06 DIAGNOSIS — R809 Proteinuria, unspecified: Secondary | ICD-10-CM | POA: Diagnosis not present

## 2014-04-06 DIAGNOSIS — E559 Vitamin D deficiency, unspecified: Secondary | ICD-10-CM | POA: Diagnosis not present

## 2014-04-06 DIAGNOSIS — E785 Hyperlipidemia, unspecified: Secondary | ICD-10-CM | POA: Diagnosis not present

## 2014-04-06 DIAGNOSIS — M810 Age-related osteoporosis without current pathological fracture: Secondary | ICD-10-CM | POA: Diagnosis not present

## 2014-04-06 DIAGNOSIS — J329 Chronic sinusitis, unspecified: Secondary | ICD-10-CM | POA: Diagnosis not present

## 2014-04-06 DIAGNOSIS — Z950 Presence of cardiac pacemaker: Secondary | ICD-10-CM | POA: Diagnosis not present

## 2014-04-06 DIAGNOSIS — D508 Other iron deficiency anemias: Secondary | ICD-10-CM | POA: Diagnosis not present

## 2014-04-06 DIAGNOSIS — E538 Deficiency of other specified B group vitamins: Secondary | ICD-10-CM | POA: Diagnosis not present

## 2014-04-06 DIAGNOSIS — Z6823 Body mass index (BMI) 23.0-23.9, adult: Secondary | ICD-10-CM | POA: Diagnosis not present

## 2014-04-06 DIAGNOSIS — J449 Chronic obstructive pulmonary disease, unspecified: Secondary | ICD-10-CM | POA: Diagnosis not present

## 2014-04-06 DIAGNOSIS — E1129 Type 2 diabetes mellitus with other diabetic kidney complication: Secondary | ICD-10-CM | POA: Diagnosis not present

## 2014-04-06 DIAGNOSIS — K219 Gastro-esophageal reflux disease without esophagitis: Secondary | ICD-10-CM | POA: Diagnosis not present

## 2014-04-07 ENCOUNTER — Ambulatory Visit (INDEPENDENT_AMBULATORY_CARE_PROVIDER_SITE_OTHER): Payer: Medicare Other | Admitting: *Deleted

## 2014-04-07 DIAGNOSIS — I442 Atrioventricular block, complete: Secondary | ICD-10-CM | POA: Diagnosis not present

## 2014-04-07 NOTE — Progress Notes (Signed)
Remote pacemaker transmission.   

## 2014-04-13 LAB — MDC_IDC_ENUM_SESS_TYPE_REMOTE
Brady Statistic AP VP Percent: 1 %
Brady Statistic AP VS Percent: 1 %
Brady Statistic AS VP Percent: 99 %
Brady Statistic AS VS Percent: 1 %
Brady Statistic RA Percent Paced: 1 %
Brady Statistic RV Percent Paced: 99 %
Date Time Interrogation Session: 20160127070014
Implantable Pulse Generator Model: 2240
Implantable Pulse Generator Serial Number: 7522008
Lead Channel Impedance Value: 460 Ohm
Lead Channel Sensing Intrinsic Amplitude: 12 mV
Lead Channel Sensing Intrinsic Amplitude: 3.9 mV
Lead Channel Setting Pacing Pulse Width: 0.4 ms
Lead Channel Setting Sensing Sensitivity: 12.5 mV
MDC IDC MSMT BATTERY REMAINING LONGEVITY: 63 mo
MDC IDC MSMT BATTERY REMAINING PERCENTAGE: 76 %
MDC IDC MSMT BATTERY VOLTAGE: 2.96 V
MDC IDC MSMT LEADCHNL RV IMPEDANCE VALUE: 560 Ohm
MDC IDC MSMT LEADCHNL RV PACING THRESHOLD AMPLITUDE: 0.625 V
MDC IDC MSMT LEADCHNL RV PACING THRESHOLD PULSEWIDTH: 0.4 ms
MDC IDC SET LEADCHNL RA PACING AMPLITUDE: 2 V
MDC IDC SET LEADCHNL RV PACING AMPLITUDE: 5 V

## 2014-05-11 DIAGNOSIS — D508 Other iron deficiency anemias: Secondary | ICD-10-CM | POA: Diagnosis not present

## 2014-05-11 DIAGNOSIS — E538 Deficiency of other specified B group vitamins: Secondary | ICD-10-CM | POA: Diagnosis not present

## 2014-05-11 DIAGNOSIS — R809 Proteinuria, unspecified: Secondary | ICD-10-CM | POA: Diagnosis not present

## 2014-05-11 DIAGNOSIS — M810 Age-related osteoporosis without current pathological fracture: Secondary | ICD-10-CM | POA: Diagnosis not present

## 2014-05-11 DIAGNOSIS — J449 Chronic obstructive pulmonary disease, unspecified: Secondary | ICD-10-CM | POA: Diagnosis not present

## 2014-05-11 DIAGNOSIS — Z6823 Body mass index (BMI) 23.0-23.9, adult: Secondary | ICD-10-CM | POA: Diagnosis not present

## 2014-05-11 DIAGNOSIS — E559 Vitamin D deficiency, unspecified: Secondary | ICD-10-CM | POA: Diagnosis not present

## 2014-05-11 DIAGNOSIS — K219 Gastro-esophageal reflux disease without esophagitis: Secondary | ICD-10-CM | POA: Diagnosis not present

## 2014-05-11 DIAGNOSIS — E785 Hyperlipidemia, unspecified: Secondary | ICD-10-CM | POA: Diagnosis not present

## 2014-05-11 DIAGNOSIS — E1129 Type 2 diabetes mellitus with other diabetic kidney complication: Secondary | ICD-10-CM | POA: Diagnosis not present

## 2014-05-19 ENCOUNTER — Encounter: Payer: Self-pay | Admitting: Internal Medicine

## 2014-07-05 DIAGNOSIS — N3946 Mixed incontinence: Secondary | ICD-10-CM | POA: Diagnosis not present

## 2014-07-05 DIAGNOSIS — N3020 Other chronic cystitis without hematuria: Secondary | ICD-10-CM | POA: Diagnosis not present

## 2014-07-06 ENCOUNTER — Ambulatory Visit (INDEPENDENT_AMBULATORY_CARE_PROVIDER_SITE_OTHER): Payer: Medicare Other | Admitting: *Deleted

## 2014-07-06 ENCOUNTER — Encounter: Payer: Self-pay | Admitting: Internal Medicine

## 2014-07-06 DIAGNOSIS — I442 Atrioventricular block, complete: Secondary | ICD-10-CM

## 2014-07-06 LAB — MDC_IDC_ENUM_SESS_TYPE_REMOTE
Battery Remaining Longevity: 104 mo
Battery Remaining Percentage: 95.5 %
Battery Voltage: 2.99 V
Brady Statistic AS VP Percent: 99 %
Brady Statistic RV Percent Paced: 99 %
Lead Channel Impedance Value: 430 Ohm
Lead Channel Pacing Threshold Amplitude: 0.625 V
Lead Channel Pacing Threshold Pulse Width: 0.4 ms
Lead Channel Setting Pacing Amplitude: 0.875
Lead Channel Setting Sensing Sensitivity: 12.5 mV
MDC IDC MSMT LEADCHNL RA SENSING INTR AMPL: 3 mV
MDC IDC MSMT LEADCHNL RV IMPEDANCE VALUE: 450 Ohm
MDC IDC PG SERIAL: 7522008
MDC IDC SESS DTM: 20160426060024
MDC IDC SET LEADCHNL RA PACING AMPLITUDE: 2 V
MDC IDC SET LEADCHNL RV PACING PULSEWIDTH: 0.4 ms
MDC IDC STAT BRADY AP VP PERCENT: 1 %
MDC IDC STAT BRADY AP VS PERCENT: 1 %
MDC IDC STAT BRADY AS VS PERCENT: 1 %
MDC IDC STAT BRADY RA PERCENT PACED: 1 %

## 2014-07-06 NOTE — Progress Notes (Signed)
Remote pacemaker transmission.   

## 2014-07-09 ENCOUNTER — Encounter: Payer: Self-pay | Admitting: Cardiology

## 2014-07-12 DIAGNOSIS — H4011X1 Primary open-angle glaucoma, mild stage: Secondary | ICD-10-CM | POA: Diagnosis not present

## 2014-07-12 DIAGNOSIS — Q150 Congenital glaucoma: Secondary | ICD-10-CM | POA: Diagnosis not present

## 2014-07-15 ENCOUNTER — Ambulatory Visit: Payer: Medicare Other | Admitting: Internal Medicine

## 2014-08-10 DIAGNOSIS — Z6823 Body mass index (BMI) 23.0-23.9, adult: Secondary | ICD-10-CM | POA: Diagnosis not present

## 2014-08-10 DIAGNOSIS — R809 Proteinuria, unspecified: Secondary | ICD-10-CM | POA: Diagnosis not present

## 2014-08-10 DIAGNOSIS — Z1389 Encounter for screening for other disorder: Secondary | ICD-10-CM | POA: Diagnosis not present

## 2014-08-10 DIAGNOSIS — E785 Hyperlipidemia, unspecified: Secondary | ICD-10-CM | POA: Diagnosis not present

## 2014-08-10 DIAGNOSIS — M810 Age-related osteoporosis without current pathological fracture: Secondary | ICD-10-CM | POA: Diagnosis not present

## 2014-08-10 DIAGNOSIS — J449 Chronic obstructive pulmonary disease, unspecified: Secondary | ICD-10-CM | POA: Diagnosis not present

## 2014-08-10 DIAGNOSIS — E559 Vitamin D deficiency, unspecified: Secondary | ICD-10-CM | POA: Diagnosis not present

## 2014-08-10 DIAGNOSIS — E1129 Type 2 diabetes mellitus with other diabetic kidney complication: Secondary | ICD-10-CM | POA: Diagnosis not present

## 2014-08-12 ENCOUNTER — Ambulatory Visit (INDEPENDENT_AMBULATORY_CARE_PROVIDER_SITE_OTHER): Payer: Medicare Other | Admitting: Internal Medicine

## 2014-08-12 ENCOUNTER — Encounter: Payer: Self-pay | Admitting: Internal Medicine

## 2014-08-12 VITALS — BP 164/86 | HR 78 | Ht 64.5 in | Wt 135.3 lb

## 2014-08-12 DIAGNOSIS — Z950 Presence of cardiac pacemaker: Secondary | ICD-10-CM

## 2014-08-12 DIAGNOSIS — I10 Essential (primary) hypertension: Secondary | ICD-10-CM | POA: Diagnosis not present

## 2014-08-12 DIAGNOSIS — I469 Cardiac arrest, cause unspecified: Secondary | ICD-10-CM

## 2014-08-12 DIAGNOSIS — I442 Atrioventricular block, complete: Secondary | ICD-10-CM

## 2014-08-12 NOTE — Progress Notes (Signed)
OFFICE NOTE  Chief Complaint:  No complaints  Primary Care Physician: [name], MD  HPI:  Wendy Velasquez is a 81 y.o. female with a past medical history significant for COPD, HTN, GERD, HPL and weakness. She apparently got up to go to the bathroom recently and passed out. When EMS arrived she was noted to be extremely bradycardic. 12-lead EKG showed complete heart block. She was administered atropine in route and loss consciousness and when she awakened she vomited. External pacing was attempted. Upon arrival to the trauma bay she was noted to be in complete heart block with a slow ventricular response. Ventricular rate was less than 20. She was hypotensive, ashen and poorly perfusing. Mental status was altered. She had external pacing pads applied with apparent capture on high output of a rate of 60. When I arrived in the emergency room, family was at the bedside and the patient was noncommunicative on a nonrebreather. Shortly thereafter her pupils became fixed and dilated and she lost muscle tone. Pulses were checked and barely palpable, and loss of mechanical capture was confirmed. CPR was initiated and 1 mg of epinephrine was administered IV. I had notified Dr. Lovena Le with cardiac electrophysiology and he arrived around this time. We immediately called cardiac arrest and started resuscitation. Dr. Lovena Le secured the airway with intubation and I placed a temporary transvenous pacing from the right femoral vein (see separate procedure note for details). We were able to achieve capture fairly quickly with the temporary pacer. After temporary pacing she stabilized with an improved blood pressure of around 213 systolic, however epinephrine was starting to wear off and her blood pressure had dropped into the 08M to 57Q systolic. We had a discussion with the family regarding placement of a permanent pacemaker and they are agreeable to this. She underwent successful pacemaker placement by Dr.  Lovena Le. Her hospitalization was fairly brief afterwards and she recovered quite quickly. Unfortunately she did undergo short period of CPR does have some pain associated with that. He may have aspirated and possibly had a aspiration pneumonitis on top of underlying COPD. She's been on antibiotics for that as well as a urinary tract infection which has been difficult to treat.   Wendy Velasquez returns today and is feeling quite well. 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 She denies any chest pain or shortness of breath. Her energy level is good. She feels like she can at least walk a mile every day with her walker which is quite amazing. She is scheduled to see Dr. Lovena Le next month.  PMHx:  Past Medical History  Diagnosis Date  . Hypertension   . COPD (chronic obstructive pulmonary disease)   . Diabetes mellitus without complication   . Hypercholesterolemia   . GERD (gastroesophageal reflux disease)   . Pacemaker 09/11/2012    dual chamber  . Dysrhythmia 09/11/2012    complete heart block   with CPR  . Asthma   . Anemia     iron def    Past Surgical History  Procedure Laterality Date  . Interstim implant placement    . Insert / replace / remove pacemaker  09/11/2012    DUAL CHAMBER  DR Lovena Le  . Abdominal hysterectomy    . Permanent pacemaker insertion N/A 09/11/2012    Procedure: PERMANENT PACEMAKER INSERTION;  Surgeon: Evans Lance, MD;  Location: [facility] CATH LAB;  Service: Cardiovascular;  Laterality: N/A;    FAMHx:  No family history on file.  SOCHx:  reports that she has never smoked. She has never used smokeless tobacco. She reports that she does not drink alcohol or use illicit drugs.  ALLERGIES:  No Known Allergies  ROS: A comprehensive review of systems was negative.  HOME MEDS: Current Outpatient Prescriptions  Medication Sig Dispense Refill  . albuterol-ipratropium (COMBIVENT) 18-103 MCG/ACT inhaler Inhale 2 puffs into the lungs every 6 (six) hours as  needed for wheezing.    Marland Kitchen aspirin EC 81 MG tablet Take 81 mg by mouth daily.    . bimatoprost (LUMIGAN) 0.01 % SOLN Apply 1 drop to eye at bedtime.    . Brinzolamide-Brimonidine (SIMBRINZA) 1-0.2 % SUSP Apply 1 drop to eye at bedtime.    Marland Kitchen buPROPion (WELLBUTRIN) 100 MG tablet Take 100 mg by mouth daily.    Marland Kitchen esomeprazole (NEXIUM) 40 MG capsule Take 40 mg by mouth daily before breakfast.    . Fluticasone Furoate-Vilanterol (BREO ELLIPTA) 100-25 MCG/INH AEPB Inhale 1 puff into the lungs daily.    Marland Kitchen gabapentin (NEURONTIN) 100 MG capsule Take 200 mg by mouth at bedtime.    . ibandronate (BONIVA) 150 MG tablet Take 150 mg by mouth every 30 (thirty) days. Take in the morning with a full glass of water, on an empty stomach, and do not take anything else by mouth or lie down for the next 30 min.    . insulin glargine (LANTUS) 100 UNIT/ML injection Inject 14 Units into the skin at bedtime.     . metFORMIN (GLUCOPHAGE) 1000 MG tablet Take 1,000 mg by mouth 2 (two) times daily with a meal.    . Nebulizer MISC Inhale 1 ampule into the lungs every 6 (six) hours as needed (wheezing or shortness of breath).    . sertraline (ZOLOFT) 50 MG tablet Take 50 mg by mouth 2 (two) times daily.    . simvastatin (ZOCOR) 10 MG tablet Take 10 mg by mouth at bedtime.    . sitaGLIPtin (JANUVIA) 100 MG tablet Take 100 mg by mouth daily.    Marland Kitchen trimethoprim (TRIMPEX) 100 MG tablet Take 100 mg by mouth daily.     . vitamin B-12 (CYANOCOBALAMIN) 1000 MCG tablet Take 1,000 mcg by mouth daily.     No current facility-administered medications for this visit.    LABS/IMAGING: No results found for this or any previous visit (from the past 48 hour(s)). No results found.  VITALS: BP 164/86 mmHg  Pulse 78  Ht 5' 4.5" (1.638 m)  Wt 135 lb 4.8 oz (61.372 kg)  BMI 22.87 kg/m2  EXAM: General appearance: alert and no distress Neck: no adenopathy, no carotid bruit, no JVD, supple, symmetrical, trachea midline and thyroid not  enlarged, symmetric, no tenderness/mass/nodules Lungs: clear to auscultation bilaterally Heart: regular rate and rhythm, S1, S2 normal, no murmur, click, rub or gallop Abdomen: soft, non-tender; bowel sounds normal; no masses,  no organomegaly Extremities: extremities normal, atraumatic, no cyanosis or edema and Pacemaker site appears well-healed without hematoma or erythema Pulses: 2+ and symmetric Skin: Skin color, texture, turgor normal. No rashes or lesions Neurologic: Grossly normal  EKG: A sensed, V paced rhythm at 78  ASSESSMENT: 1. Complete heart block status post dual-chamber St. Jude pacemaker 2. Cardiac arrest-resuscitated 3. COPD 4. Borderline HTN  PLAN: 1.   Wendy Velasquez is doing quite well. Her energy level is good. She can walk almost a mile every day with her walker which is quite tremendous. Blood pressure seems to be pretty well controlled. It was up a little  bit today but she did not take her medicine. At her primary care doctor's office the blood pressure is excellent. Overall she is doing really well. She's due for a pacemaker check with Dr. Lovena Le next month. I have no further suggestions at this time.  Pixie Casino, MD, [facility] Attending Cardiologist [facility] 08/12/2014, 5:16 PM

## 2014-08-12 NOTE — Patient Instructions (Signed)
Dr Debara Pickett has made no changes in your current therapy or treatment plan.  Dr Debara Pickett recommends that you schedule a follow-up appointment in 1 year. You will receive a reminder letter in the mail two months in advance. If you don't receive a letter, please call our office to schedule the follow-up appointment.

## 2014-08-23 DIAGNOSIS — N3946 Mixed incontinence: Secondary | ICD-10-CM | POA: Diagnosis not present

## 2014-08-23 DIAGNOSIS — N3020 Other chronic cystitis without hematuria: Secondary | ICD-10-CM | POA: Diagnosis not present

## 2014-08-27 ENCOUNTER — Other Ambulatory Visit: Payer: Self-pay | Admitting: Urology

## 2014-08-27 ENCOUNTER — Telehealth: Payer: Self-pay | Admitting: Internal Medicine

## 2014-08-27 NOTE — Telephone Encounter (Signed)
Request for surgical clearance:  1. What type of surgery is being performed? Inter stem Stage 1 and 2  2. When is this surgery scheduled? 09/21/2014  3. Are there any medications that need to be held prior to surgery and how long? Asprin 5 days  4. Name of physician performing surgery? Scott Macdiarmid  5. What is your office phone and fax number?  Fax [phone] Phone [phone] Ext [phone]

## 2014-08-27 NOTE — Telephone Encounter (Signed)
Ok to hold aspirin 5 days before procedure. Start 24-48 hours after.   Dr. Lemmie Evens

## 2014-08-30 NOTE — Telephone Encounter (Signed)
Message faxed via EPIC to Dr. Matilde Sprang

## 2014-09-17 ENCOUNTER — Encounter (HOSPITAL_BASED_OUTPATIENT_CLINIC_OR_DEPARTMENT_OTHER): Payer: Self-pay | Admitting: *Deleted

## 2014-09-17 NOTE — Progress Notes (Signed)
NPO AFTER MN.  ARRIVE AT 4742.  NEEDS ISTAT.  CURRENT EKG IN CHART AND EPIC.  WILL TAKE NEXIUM AND DO ADVAIR INHALER AM DOS W/ SIPS OF WATER.  WILL BRING RESCUE INHALER DOS .  ALSO, ADVISED PT TO DO NEBULIZER HS BEFORE DOS.

## 2014-09-19 DIAGNOSIS — N390 Urinary tract infection, site not specified: Secondary | ICD-10-CM | POA: Diagnosis not present

## 2014-09-20 NOTE — H&P (Signed)
History of Present Illness   Wendy Velasquez has refractory urge incontinence. In [place] she failed PTNS and had Interstim. She does not wish to have Botox. There is no question she was having recurrent urinary tract infections and I was hoping that trimethoprim would down regulate her symptoms and help the Interstim work a little bit better. In January her culture was positive.    When I saw Wendy Velasquez in April 2016 she was doing well on trimethoprim. She reported her InterStim shutting on and off. She has had vaginal discomfort in the past. This is secondary to the device being turned up too high. Reprogramming helped the vaginal discomfort. There is a question of whether or not her 20 pound weight loss may have altered the device. She met with [employer] on Jul 14, 2014, and she only has 5 months left.   Today she underwent a number of tests which I personally reviewed.  Urinalysis: Negative.   Frequency: Stable.   Wendy Velasquez says the device is not working at all now. She said it was not working well when she saw [facility]. She describes that the lead is not working well. Again, she said her vaginal pain would come when she would turn the device on.    Past Medical History Problems  1. History of Asthma (J45.909) 2. History of Coagulation Defects 3. History of diabetes mellitus (Z86.39) 4. History of esophageal reflux (Z87.19) 5. History of glaucoma (Z86.69)  Current Meds 1. Advair Diskus 250-50 MCG/DOSE MISC;  Therapy: 15Oct2007 to Recorded 2. Albuterol-Ipratropium SOLN;  Therapy: (Recorded:22Jul2008) to Recorded 3. Amitriptyline HCl - 25 MG Oral Tablet; 1HS - TAKE ONE TO TWO TABLETS BY MOUTH AT  BEDTIME;  Therapy: [id number] to (Evaluate:10May2011); Last Rx:14Jul2010 Ordered 4. Aspirin Low Dose 81 MG TABS;  Therapy: (Recorded:22Jul2008) to Recorded 5. Carafate 1 GM/10ML Oral Suspension;  Therapy: 03TDH7416 to Recorded 6. Ciprofloxacin HCl - 250 MG Oral Tablet; TAKE 1 TABLET  BID;  Therapy: 38GTX6468 to (EHOZYYQM:25OIB7048)  Requested for: 88BVQ9450; Last  Rx:16Jan2015 Ordered 7. Colace CAPS;  Therapy: (Recorded:29Nov2007) to Recorded 8. Combivent AERO;  Therapy: (Recorded:29Nov2007) to Recorded 9. Diovan CAPS;  Therapy: (Recorded:29Nov2007) to Recorded 10. Ditropan 5 MG TABS;   Therapy: (Recorded:29Nov2007) to Recorded 11. Flonase 50 MCG/ACT SUSP;   Therapy: (Recorded:29Nov2007) to Recorded 12. Glucophage TABS;   Therapy: (Recorded:29Nov2007) to Recorded 13. HumaLOG 100 UNIT/ML Subcutaneous Solution;   Therapy: 10Sep2007 to Recorded 14. Humulin L SUSP;   Therapy: (Recorded:29Nov2007) to Recorded 15. HumuLIN N 100 UNIT/ML Subcutaneous Suspension;   Therapy: [id number] to Recorded 16. Lumigan 0.03 % SOLN;   Therapy: (Recorded:29Nov2007) to Recorded 17. Mag-Ox 400 TABS;   Therapy: (Recorded:08Apr2008) to Recorded 18. Methenamine Hippurate 1 GM Oral Tablet; TAKE 1/2 TABLET TWICE A DAY;   Therapy: 21Mar2011 to (Evaluate:20Mar2012); Last Rx:21Mar2011 Ordered 19. NexIUM 40 MG Oral Capsule Delayed Release;   Therapy: 22Sep2007 to Recorded 20. Nitrofurantoin Macrocrystal 100 MG Oral Capsule; Take 1 capsule twice daily;   Therapy: 49ZPH1505 to (Evaluate:02Feb2015)  Requested for: 26Jan2015; Last   Rx:26Jan2015 Ordered 21. OneTouch Ultra Blue In [employer];   Therapy: 17Oct2007 to Recorded 22. Reclast 5 MG/100ML Intravenous Solution;   Therapy: (Recorded:22Jul2008) to Recorded 23. ReliOn Insulin Syringe 31G X 5/16" 0.3 ML Miscellaneous;   Therapy: 22Oct2007 to Recorded 24. Singulair TABS;   Therapy: (Recorded:29Nov2007) to Recorded 25. Trimethoprim 100 MG Oral Tablet; Take 1 tablet daily;   Therapy: 23Jan2015 to (Evaluate:20Apr2017)  Requested for: 25Apr2016; Last   Rx:25Apr2016 Ordered 26. Vitamin B-12  SOLN;   Therapy: (Recorded:21Mar2011) to Recorded 27. Vitamin C TABS;   Therapy: (Recorded:17Aug2009) to Recorded 28. Vitamin D TABS;   Therapy:  (Recorded:17Aug2009) to Recorded 29. Ziac TABS;   Therapy: (Recorded:29Nov2007) to Recorded  Allergies Medication  1. No Known Drug Allergies  Social History Problems  1. Alcohol Use   1 WINE 2. Caffeine Use   1 COFFEE 3. Occupation:   RETIRED 4. Tobacco Use  Results/Data Urine [Data Includes: Last 1 Day]   [id number]  COLOR YELLOW   APPEARANCE CLEAR   SPECIFIC GRAVITY 1.020   pH 6.0   GLUCOSE 250 mg/dL  BILIRUBIN NEG   KETONE TRACE mg/dL  BLOOD NEG   PROTEIN NEG mg/dL  UROBILINOGEN 0.2 mg/dL  NITRITE NEG   LEUKOCYTE ESTERASE NEG    Assessment Assessed  1. Urge and stress incontinence (N39.46) 2. Chronic cystitis (N30.20)  Plan Health Maintenance  1. UA With REFLEX; [Do Not Release]; Status:Resulted - Requires Verification;   Done:  [id number] 08:50AM Urge and stress incontinence  2. Follow-up Schedule Surgery Office  Follow-up  Status: Complete  Done: [id number]  Discussion/Summary   Wendy Velasquez and I talked about changing the IPG versus the entire device. In my opinion, and I believe [employer] would agree, the entire device should be changed. The use of the other side is a possibility and discussed. I am hoping she will not have vaginal discomfort with the device.   Pros, cons, success and failure rates of Interstim were discussed. We talked about the test stimulation (office/operating room) and the second stage procedure. Risks were described but not limited to the risk of persistent, de novo, or worsening incontinence. Risks of pain, bleeding, infection, and neuropathy were discussed. Risk of malfunction, migration, and breakage were discussed. Trouble-shooting, battery life, and the need for explanation and reoperation were discussed. MRI issues were discussed. The patient understands that she might not reach her treatment goal and that she might be worse following surgery.  A mild increase and risk of retention discussed. On physical examination I could see  her incisions and her right high buttock incisions quite lateral but I think it is relative to her weight loss. It is comfortable.   Modifying factors: There are no other modifying factors  Associated signs and symptoms: There are no other associated signs and symptoms Aggravating and relieving factors: There are no other aggravating or relieving factors Severity: Moderate Duration: Persistent

## 2014-09-21 ENCOUNTER — Ambulatory Visit (HOSPITAL_COMMUNITY): Payer: Medicare Other

## 2014-09-21 ENCOUNTER — Encounter (HOSPITAL_BASED_OUTPATIENT_CLINIC_OR_DEPARTMENT_OTHER): Payer: Self-pay

## 2014-09-21 ENCOUNTER — Ambulatory Visit (HOSPITAL_BASED_OUTPATIENT_CLINIC_OR_DEPARTMENT_OTHER): Payer: Medicare Other | Admitting: Anesthesiology

## 2014-09-21 ENCOUNTER — Encounter (HOSPITAL_BASED_OUTPATIENT_CLINIC_OR_DEPARTMENT_OTHER)
Admission: RE | Disposition: A | Discharge status: Home / Self Care | Payer: Self-pay | Source: Ambulatory Visit | Attending: Urology

## 2014-09-21 ENCOUNTER — Ambulatory Visit (HOSPITAL_BASED_OUTPATIENT_CLINIC_OR_DEPARTMENT_OTHER)
Admission: RE | Admit: 2014-09-21 | Discharge: 2014-09-21 | Disposition: A | Discharge status: Home / Self Care | Payer: Medicare Other | Source: Ambulatory Visit | Attending: Urology | Admitting: Urology

## 2014-09-21 DIAGNOSIS — Z794 Long term (current) use of insulin: Secondary | ICD-10-CM | POA: Insufficient documentation

## 2014-09-21 DIAGNOSIS — Z7982 Long term (current) use of aspirin: Secondary | ICD-10-CM | POA: Diagnosis not present

## 2014-09-21 DIAGNOSIS — H409 Unspecified glaucoma: Secondary | ICD-10-CM | POA: Insufficient documentation

## 2014-09-21 DIAGNOSIS — Y929 Unspecified place or not applicable: Secondary | ICD-10-CM | POA: Insufficient documentation

## 2014-09-21 DIAGNOSIS — E119 Type 2 diabetes mellitus without complications: Secondary | ICD-10-CM | POA: Diagnosis not present

## 2014-09-21 DIAGNOSIS — Z79899 Other long term (current) drug therapy: Secondary | ICD-10-CM | POA: Insufficient documentation

## 2014-09-21 DIAGNOSIS — I10 Essential (primary) hypertension: Secondary | ICD-10-CM | POA: Insufficient documentation

## 2014-09-21 DIAGNOSIS — J9600 Acute respiratory failure, unspecified whether with hypoxia or hypercapnia: Secondary | ICD-10-CM | POA: Diagnosis not present

## 2014-09-21 DIAGNOSIS — T83110A Breakdown (mechanical) of urinary electronic stimulator device, initial encounter: Secondary | ICD-10-CM | POA: Insufficient documentation

## 2014-09-21 DIAGNOSIS — N3946 Mixed incontinence: Secondary | ICD-10-CM | POA: Insufficient documentation

## 2014-09-21 DIAGNOSIS — Z950 Presence of cardiac pacemaker: Secondary | ICD-10-CM | POA: Insufficient documentation

## 2014-09-21 DIAGNOSIS — Y838 Other surgical procedures as the cause of abnormal reaction of the patient, or of later complication, without mention of misadventure at the time of the procedure: Secondary | ICD-10-CM | POA: Diagnosis not present

## 2014-09-21 DIAGNOSIS — N3941 Urge incontinence: Secondary | ICD-10-CM | POA: Diagnosis not present

## 2014-09-21 DIAGNOSIS — N3020 Other chronic cystitis without hematuria: Secondary | ICD-10-CM | POA: Insufficient documentation

## 2014-09-21 DIAGNOSIS — Z9889 Other specified postprocedural states: Secondary | ICD-10-CM | POA: Diagnosis not present

## 2014-09-21 DIAGNOSIS — J45909 Unspecified asthma, uncomplicated: Secondary | ICD-10-CM | POA: Diagnosis not present

## 2014-09-21 DIAGNOSIS — Z7951 Long term (current) use of inhaled steroids: Secondary | ICD-10-CM | POA: Diagnosis not present

## 2014-09-21 DIAGNOSIS — J449 Chronic obstructive pulmonary disease, unspecified: Secondary | ICD-10-CM | POA: Insufficient documentation

## 2014-09-21 DIAGNOSIS — K219 Gastro-esophageal reflux disease without esophagitis: Secondary | ICD-10-CM | POA: Insufficient documentation

## 2014-09-21 DIAGNOSIS — T83190A Other mechanical complication of urinary electronic stimulator device, initial encounter: Secondary | ICD-10-CM | POA: Diagnosis not present

## 2014-09-21 HISTORY — DX: Unspecified glaucoma: H40.9

## 2014-09-21 HISTORY — DX: Urge incontinence: N39.41

## 2014-09-21 HISTORY — DX: Personal history of sudden cardiac arrest: Z86.74

## 2014-09-21 HISTORY — DX: Diverticulosis of large intestine without perforation or abscess without bleeding: K57.30

## 2014-09-21 HISTORY — PX: INTERSTIM IMPLANT REVISION: SHX5138

## 2014-09-21 HISTORY — DX: Other specified postprocedural states: Z98.890

## 2014-09-21 HISTORY — DX: Sick sinus syndrome: I49.5

## 2014-09-21 HISTORY — DX: Type 2 diabetes mellitus without complications: E11.9

## 2014-09-21 HISTORY — DX: Personal history of other diseases of the digestive system: Z87.19

## 2014-09-21 HISTORY — DX: Atrioventricular block, complete: I44.2

## 2014-09-21 HISTORY — DX: Hyperlipidemia, unspecified: E78.5

## 2014-09-21 HISTORY — DX: Personal history of diseases of the blood and blood-forming organs and certain disorders involving the immune mechanism: Z86.2

## 2014-09-21 HISTORY — DX: Polyneuropathy, unspecified: G62.9

## 2014-09-21 HISTORY — PX: INTERSTIM IMPLANT REMOVAL: SHX5131

## 2014-09-21 HISTORY — DX: Presence of cardiac pacemaker: Z95.0

## 2014-09-21 HISTORY — DX: Presence of spectacles and contact lenses: Z97.3

## 2014-09-21 LAB — POCT I-STAT 4, (NA,K, GLUC, HGB,HCT)
GLUCOSE: 133 mg/dL — AB (ref 65–99)
HCT: 36 % (ref 36.0–46.0)
Hemoglobin: 12.2 g/dL (ref 12.0–15.0)
Potassium: 4.1 mmol/L (ref 3.5–5.1)
Sodium: 141 mmol/L (ref 135–145)

## 2014-09-21 LAB — GLUCOSE, CAPILLARY: GLUCOSE-CAPILLARY: 113 mg/dL — AB (ref 65–99)

## 2014-09-21 SURGERY — REMOVAL, NEUROSTIMULATOR, SACRAL
Anesthesia: Monitor Anesthesia Care | Site: Buttocks

## 2014-09-21 MED ORDER — FENTANYL CITRATE (PF) 100 MCG/2ML IJ SOLN
INTRAMUSCULAR | Status: DC | PRN
  Administered 2014-09-21 (×4): 6.25 ug via INTRAVENOUS

## 2014-09-21 MED ORDER — LACTATED RINGERS IV SOLN
INTRAVENOUS | Status: DC
Start: 2014-09-21 — End: 2014-09-21
  Administered 2014-09-21: 09:00:00 via INTRAVENOUS
  Filled 2014-09-21: qty 1000

## 2014-09-21 MED ORDER — CEFAZOLIN SODIUM-DEXTROSE 2-3 GM-% IV SOLR
2.0000 g | INTRAVENOUS | Status: AC
  Administered 2014-09-21: 2 g via INTRAVENOUS
  Filled 2014-09-21: qty 50

## 2014-09-21 MED ORDER — LIDOCAINE-EPINEPHRINE (PF) 1 %-1:200000 IJ SOLN
INTRAMUSCULAR | Status: DC | PRN
  Administered 2014-09-21: 15 mL

## 2014-09-21 MED ORDER — STERILE WATER FOR IRRIGATION IR SOLN
Status: DC | PRN
  Administered 2014-09-21: 500 mL

## 2014-09-21 MED ORDER — CEFAZOLIN SODIUM-DEXTROSE 2-3 GM-% IV SOLR
INTRAVENOUS | Status: AC
  Filled 2014-09-21: qty 50

## 2014-09-21 MED ORDER — FENTANYL CITRATE (PF) 100 MCG/2ML IJ SOLN
INTRAMUSCULAR | Status: AC
  Filled 2014-09-21: qty 2

## 2014-09-21 MED ORDER — ONDANSETRON HCL 4 MG/2ML IJ SOLN
INTRAMUSCULAR | Status: DC | PRN
  Administered 2014-09-21: 4 mg via INTRAVENOUS

## 2014-09-21 MED ORDER — HYDROCODONE-ACETAMINOPHEN 5-325 MG PO TABS: 1.0000 | ORAL_TABLET | Freq: Four times a day (QID) | ORAL | Status: DC | PRN

## 2014-09-21 MED ORDER — BUPIVACAINE-EPINEPHRINE 0.5% -1:200000 IJ SOLN
INTRAMUSCULAR | Status: DC | PRN
  Administered 2014-09-21: 15 mL

## 2014-09-21 MED ORDER — ACETAMINOPHEN 10 MG/ML IV SOLN
INTRAVENOUS | Status: DC | PRN
  Administered 2014-09-21: 1000 mg via INTRAVENOUS

## 2014-09-21 MED ORDER — CEPHALEXIN 500 MG PO CAPS: 500.0000 mg | ORAL_CAPSULE | Freq: Three times a day (TID) | ORAL | Status: DC

## 2014-09-21 MED ORDER — PROPOFOL 500 MG/50ML IV EMUL
INTRAVENOUS | Status: DC | PRN
  Administered 2014-09-21: 200 ug/kg/min via INTRAVENOUS

## 2014-09-21 MED ORDER — FENTANYL CITRATE (PF) 100 MCG/2ML IJ SOLN
25.0000 ug | INTRAMUSCULAR | Status: DC | PRN
  Filled 2014-09-21: qty 1

## 2014-09-21 MED ORDER — CEFAZOLIN SODIUM 1-5 GM-% IV SOLN
1.0000 g | INTRAVENOUS | Status: DC
  Filled 2014-09-21: qty 50

## 2014-09-21 SURGICAL SUPPLY — 57 items
ANTNA NRSTM XTRN TELEM NS LF (UROLOGICAL SUPPLIES) ×1
BANDAGE ADHESIVE 1X3 (GAUZE/BANDAGES/DRESSINGS) IMPLANT
BLADE HEX COATED 2.75 (ELECTRODE) ×3 IMPLANT
BLADE SURG 15 STRL LF DISP TIS (BLADE) ×1 IMPLANT
BLADE SURG 15 STRL SS (BLADE) ×3
CABLE TEST STIMULATION (UROLOGICAL SUPPLIES) ×2 IMPLANT
CLOSURE WOUND 1/2 X4 (GAUZE/BANDAGES/DRESSINGS) ×1
CLOTH BEACON ORANGE TIMEOUT ST (SAFETY) ×3 IMPLANT
COVER BACK TABLE 60X90IN (DRAPES) ×3 IMPLANT
COVER MAYO STAND STRL (DRAPES) ×3 IMPLANT
COVER PROBE W GEL 5X96 (DRAPES) ×3 IMPLANT
DRAPE C-ARM 42X72 X-RAY (DRAPES) ×4 IMPLANT
DRAPE INCISE 23X17 IOBAN STRL (DRAPES) ×2
DRAPE INCISE 23X17 STRL (DRAPES) ×1 IMPLANT
DRAPE INCISE IOBAN 23X17 STRL (DRAPES) ×1 IMPLANT
DRAPE INCISE IOBAN 66X45 STRL (DRAPES) ×3 IMPLANT
DRAPE LAPAROSCOPIC ABDOMINAL (DRAPES) ×3 IMPLANT
DRSG TEGADERM 4X4.75 (GAUZE/BANDAGES/DRESSINGS) ×5 IMPLANT
DRSG TELFA 3X8 NADH (GAUZE/BANDAGES/DRESSINGS) ×3 IMPLANT
ELECT REM PT RETURN 9FT ADLT (ELECTROSURGICAL)
ELECTRODE REM PT RTRN 9FT ADLT (ELECTROSURGICAL) IMPLANT
GAUZE SPONGE 4X4 12PLY STRL LF (GAUZE/BANDAGES/DRESSINGS) IMPLANT
GLOVE BIO SURGEON STRL SZ 6.5 (GLOVE) ×1 IMPLANT
GLOVE BIO SURGEON STRL SZ7 (GLOVE) ×2 IMPLANT
GLOVE BIO SURGEON STRL SZ7.5 (GLOVE) ×5 IMPLANT
GLOVE BIO SURGEONS STRL SZ 6.5 (GLOVE) ×1
GLOVE BIOGEL PI IND STRL 6.5 (GLOVE) IMPLANT
GLOVE BIOGEL PI INDICATOR 6.5 (GLOVE) ×2
GOWN STRL REUS W/ TWL LRG LVL3 (GOWN DISPOSABLE) ×1 IMPLANT
GOWN STRL REUS W/ TWL XL LVL3 (GOWN DISPOSABLE) ×1 IMPLANT
GOWN STRL REUS W/TWL LRG LVL3 (GOWN DISPOSABLE) ×3
GOWN STRL REUS W/TWL XL LVL3 (GOWN DISPOSABLE) ×6
INTRODUCER GUIDE DILATR SHEATH (SET/KITS/TRAYS/PACK) ×2 IMPLANT
KIT INTERSTIM LEAD TINED 28CM (Urological Implant) ×2 IMPLANT
LIQUID BAND (GAUZE/BANDAGES/DRESSINGS) ×5 IMPLANT
NEEDLE FORAMEN 20GA 3.5  9CM (NEEDLE) IMPLANT
NEEDLE FORAMEN 20GA 5  12.5CM (NEEDLE) IMPLANT
NEEDLE HYPO 22GX1.5 SAFETY (NEEDLE) IMPLANT
NS IRRIG 500ML POUR BTL (IV SOLUTION) ×3 IMPLANT
PACK BASIN DAY SURGERY FS (CUSTOM PROCEDURE TRAY) ×3 IMPLANT
PAD DRESSING TELFA 3X8 NADH (GAUZE/BANDAGES/DRESSINGS) ×1 IMPLANT
PENCIL BUTTON HOLSTER BLD 10FT (ELECTRODE) ×2 IMPLANT
PROGRAMMER ANTENNA EXT (UROLOGICAL SUPPLIES) ×3 IMPLANT
PROGRAMMER STIMUL 2.2X1.1X3.7 (UROLOGICAL SUPPLIES) ×3 IMPLANT
SPONGE GAUZE 4X4 12PLY STER LF (GAUZE/BANDAGES/DRESSINGS) IMPLANT
STAPLER VISISTAT 35W (STAPLE) IMPLANT
STIMULATOR INTERSTIM 2X1.7X.3 (Orthopedic Implant) ×3 IMPLANT
STRIP CLOSURE SKIN 1/2X4 (GAUZE/BANDAGES/DRESSINGS) ×2 IMPLANT
SUT VIC AB 2-0 UR5 27 (SUTURE) IMPLANT
SUT VIC AB 3-0 SH 27 (SUTURE) ×3
SUT VIC AB 3-0 SH 27X BRD (SUTURE) ×1 IMPLANT
SUT VICRYL 4-0 PS2 18IN ABS (SUTURE) ×3 IMPLANT
SYR BULB IRRIGATION 50ML (SYRINGE) ×3 IMPLANT
SYR CONTROL 10ML LL (SYRINGE) IMPLANT
TOWEL OR 17X24 6PK STRL BLUE (TOWEL DISPOSABLE) ×8 IMPLANT
TRAY DSU PREP LF (CUSTOM PROCEDURE TRAY) ×3 IMPLANT
WATER STERILE IRR 500ML POUR (IV SOLUTION) ×3 IMPLANT

## 2014-09-21 NOTE — Anesthesia Preprocedure Evaluation (Addendum)
Anesthesia Evaluation  Patient identified by MRN, date of birth, ID band Patient awake    Reviewed: Allergy & Precautions, H&P , NPO status , Patient's Chart, lab work & pertinent test results  Airway Mallampati: II  TM Distance: >3 FB Neck ROM: full    Dental no notable dental hx. (+) Dental Advisory Given, Partial Upper, Missing Upper side ( lateral ) teeth missing bilaterally.:   Pulmonary neg pulmonary ROS, asthma , COPD History acute respiratory failure breath sounds clear to auscultation  Pulmonary exam normal       Cardiovascular Exercise Tolerance: Good hypertension, Pt. on medications negative cardio ROS Normal cardiovascular exam+ pacemaker Rhythm:regular Rate:Normal     Neuro/Psych glaucoma negative neurological ROS  negative psych ROS   GI/Hepatic negative GI ROS, Neg liver ROS, hiatal hernia, GERD-  Medicated and Controlled,  Endo/Other  negative endocrine ROSdiabetes  Renal/GU negative Renal ROS  negative genitourinary   Musculoskeletal   Abdominal   Peds  Hematology negative hematology ROS (+)   Anesthesia Other Findings   Reproductive/Obstetrics negative OB ROS                            Anesthesia Physical Anesthesia Plan  ASA: III  Anesthesia Plan: MAC   Post-op Pain Management:    Induction:   Airway Management Planned:   Additional Equipment:   Intra-op Plan:   Post-operative Plan:   Informed Consent: I have reviewed the patients History and Physical, chart, labs and discussed the procedure including the risks, benefits and alternatives for the proposed anesthesia with the patient or authorized representative who has indicated his/her understanding and acceptance.   Dental Advisory Given  Plan Discussed with: CRNA and Surgeon  Anesthesia Plan Comments:         Anesthesia Quick Evaluation

## 2014-09-21 NOTE — Interval H&P Note (Signed)
History and Physical Interval Note:  09/21/2014 7:11 AM  [name]  has presented today for surgery, with the diagnosis of URGENCY INCONTIENCE MALFUNCTIONING INTERSTIM   The various methods of treatment have been discussed with the patient and family. After consideration of risks, benefits and other options for treatment, the patient has consented to  Procedure(s): REMOVAL OF INTERSTIM IMPLANT (N/A) INTERSTIM STAGE ONE AND TWO (N/A) as a surgical intervention .  The patient's history has been reviewed, patient examined, no change in status, stable for surgery.  I have reviewed the patient's chart and labs.  Questions were answered to the patient's satisfaction.     Wendy Velasquez A

## 2014-09-21 NOTE — Op Note (Signed)
Preoperative diagnosis: Malfunctioning InterStim Postoperative diagnosis: Malfunctioning InterStim Surgery: Removal of InterStim and stage I and stage II replacement of InterStim and impedance check Surgeon: Dr. Nicki Reaper Dorenda Pfannenstiel Assistant: Dr. Star Age  The patient has the above diagnoses and consented above procedure. The usual skin preparation was utilized and she's given preoperative antibodies. Fluoroscopy was used to note the position of the previous IPG and lead on the patient's right side in the S3 foramina  The IPG was quite lateral but comfortable. I opened the right incision after instilling 10 mL of lidocaine epinephrine mixture. Pseudocapsule was opened. Device was explanted. I cut the lead and placed a hemostat on it.  Fluoroscopically and visually I could easily see the wire medially. 2-1/2 cm incision after 10 mL of lidocaine epinephrine mixture was performed. Lead was easily found and brought from lateral to medial  I did sharp dissection down to the bone table and under fluoroscopic guidance easily removed the entire lead including its tip  I marked the left S3 foramina on the patient's left. I used foramen needles to do so. I anesthetizde the skin. I passed the foramen needle through the S3 foramina. She had excellent toe and bellows response. I passed the guide. I made a 1 cm deep incision around the guide. The white trocar was passed to the appropriate bone table depth. The curved stylette lead was passed to the appropriate depth and she excellent bellows and toe response at all 4 positions. AP and lateral films looked good  I removed the white trocar. I retested the lead and there was excellent responses  The passer brought the lead from her left side to her far right lateral incision. The lead was delivered. As it was at the beginning of the case I left the IPG after connected to the lead upside down because the lead length would otherwise be under tension.  As a  separate procedure the impedance check was done with excellent impedance in all 4 positions  The right lateral incision was closed with running 3-0 Vicryls followed by so 4-0 subcuticular. The 2 medial incisions were closed with interrupted 4-0 Vicryls  Sterile dressing was applied  I was very pleased with the surgery and x-rays were taken

## 2014-09-21 NOTE — Interval H&P Note (Signed)
History and Physical Interval Note:  09/21/2014 7:11 AM  Wendy Velasquez  has presented today for surgery, with the diagnosis of URGENCY INCONTIENCE MALFUNCTIONING INTERSTIM   The various methods of treatment have been discussed with the patient and family. After consideration of risks, benefits and other options for treatment, the patient has consented to  Procedure(s): REMOVAL OF INTERSTIM IMPLANT (N/A) INTERSTIM STAGE ONE AND TWO (N/A) as a surgical intervention .  The patient's history has been reviewed, patient examined, no change in status, stable for surgery.  I have reviewed the patient's chart and labs.  Questions were answered to the patient's satisfaction.     Astha Probasco A

## 2014-09-21 NOTE — Anesthesia Procedure Notes (Signed)
Procedure Name: MAC Date/Time: 09/21/2014 10:19 AM Performed by: Justice Rocher Pre-anesthesia Checklist: Suction available, Emergency Drugs available, Patient identified, Patient being monitored and Timeout performed Patient Re-evaluated:Patient Re-evaluated prior to inductionOxygen Delivery Method: Simple face mask Preoxygenation: Pre-oxygenation with 100% oxygen Intubation Type: IV induction Placement Confirmation: positive ETCO2 and breath sounds checked- equal and bilateral

## 2014-09-21 NOTE — Discharge Instructions (Signed)
I have reviewed discharge instructions in detail with the patient. They will follow-up with me or their physician as scheduled. My nurse will also be calling the patients as per protocol.    CYSTOSCOPY HOME CARE INSTRUCTIONS  Activity: Rest for the remainder of the day.  Do not drive or operate equipment today.  You may resume normal activities in one to two days as instructed by your physician.   Meals: Drink plenty of liquids and eat light foods such as gelatin or soup this evening.  You may return to a normal meal plan tomorrow.  Return to Work: You may return to work in one to two days or as instructed by your physician.  Special Instructions / Symptoms: Call your physician if any of these symptoms occur:   -persistent or heavy bleeding  -bleeding which continues after first few urination  -large blood clots that are difficult to pass  -urine stream diminishes or stops completely  -fever equal to or higher than 101 degrees Farenheit.  -cloudy urine with a strong, foul odor  -severe pain  Females should always wipe from front to back after elimination.  You may feel some burning pain when you urinate.  This should disappear with time.  Applying moist heat to the lower abdomen or a hot tub bath may help relieve the pain. \  Follow-Up / Date of Return Visit to Your Physician:   Call for an appointment to arrange follow-up.  Patient Signature:  ________________________________________________________  Nurse's Signature:  ________________________________________________________    Post Anesthesia Home Care Instructions  Activity: Get plenty of rest for the remainder of the day. A responsible adult should stay with you for 24 hours following the procedure.  For the next 24 hours, DO NOT: -Drive a car -Paediatric nurse -Drink alcoholic beverages -Take any medication unless instructed by your physician -Make any legal decisions or sign important papers.  Meals: Start  with liquid foods such as gelatin or soup. Progress to regular foods as tolerated. Avoid greasy, spicy, heavy foods. If nausea and/or vomiting occur, drink only clear liquids until the nausea and/or vomiting subsides. Call your physician if vomiting continues.  Special Instructions/Symptoms: Your throat may feel dry or sore from the anesthesia or the breathing tube placed in your throat during surgery. If this causes discomfort, gargle with warm salt water. The discomfort should disappear within 24 hours.  If you had a scopolamine patch placed behind your ear for the management of post- operative nausea and/or vomiting:  1. The medication in the patch is effective for 72 hours, after which it should be removed.  Wrap patch in a tissue and discard in the trash. Wash hands thoroughly with soap and water. 2. You may remove the patch earlier than 72 hours if you experience unpleasant side effects which may include dry mouth, dizziness or visual disturbances. 3. Avoid touching the patch. Wash your hands with soap and water after contact with the patch.

## 2014-09-21 NOTE — Transfer of Care (Signed)
Immediate Anesthesia Transfer of Care Note  Patient: Wendy Velasquez  Procedure(s) Performed: Procedure(s) (LRB): REMOVAL OF INTERSTIM IMPLANT (N/A) INTERSTIM STAGE ONE AND TWO (N/A)  Patient Location: PACU  Anesthesia Type:MAC  Level of Consciousness: awake, sedated, patient cooperative and responds to stimulation  Airway & Oxygen Therapy: Patient Spontanous Breathing and Patient connected to face mask oxygen  Post-op Assessment: Report given to PACU RN, Post -op Vital signs reviewed and stable and Patient moving all extremities  Post vital signs: Reviewed and stable  Complications: No apparent anesthesia complications

## 2014-09-21 NOTE — Anesthesia Postprocedure Evaluation (Signed)
  Anesthesia Post-op Note  Patient: Wendy Velasquez  Procedure(s) Performed: Procedure(s) (LRB): REMOVAL OF INTERSTIM IMPLANT (N/A) INTERSTIM STAGE ONE AND TWO (N/A)  Patient Location: PACU  Anesthesia Type: MAC  Level of Consciousness: awake and alert   Airway and Oxygen Therapy: Patient Spontanous Breathing  Post-op Pain: mild  Post-op Assessment: Post-op Vital signs reviewed, Patient's Cardiovascular Status Stable, Respiratory Function Stable, Patent Airway and No signs of Nausea or vomiting  Last Vitals:  Filed Vitals:   09/21/14 1310  BP: 145/66  Pulse: 79  Temp: 36.8 C  Resp: 16    Post-op Vital Signs: stable   Complications: No apparent anesthesia complications

## 2014-09-22 ENCOUNTER — Encounter (HOSPITAL_BASED_OUTPATIENT_CLINIC_OR_DEPARTMENT_OTHER): Payer: Self-pay | Admitting: Urology

## 2014-09-27 ENCOUNTER — Telehealth: Payer: Self-pay | Admitting: *Deleted

## 2014-09-27 NOTE — Telephone Encounter (Signed)
UNABLE TO REACH PT TO GET FAMILY HX/STATUS.

## 2014-09-30 DIAGNOSIS — N3946 Mixed incontinence: Secondary | ICD-10-CM | POA: Diagnosis not present

## 2014-09-30 DIAGNOSIS — R350 Frequency of micturition: Secondary | ICD-10-CM | POA: Diagnosis not present

## 2014-10-01 ENCOUNTER — Ambulatory Visit (INDEPENDENT_AMBULATORY_CARE_PROVIDER_SITE_OTHER): Payer: Medicare Other | Admitting: Internal Medicine

## 2014-10-01 ENCOUNTER — Encounter: Payer: Self-pay | Admitting: Internal Medicine

## 2014-10-01 VITALS — BP 130/60 | HR 83 | Ht 64.5 in | Wt 133.0 lb

## 2014-10-01 DIAGNOSIS — R630 Anorexia: Secondary | ICD-10-CM

## 2014-10-01 DIAGNOSIS — Z950 Presence of cardiac pacemaker: Secondary | ICD-10-CM | POA: Diagnosis not present

## 2014-10-01 DIAGNOSIS — I442 Atrioventricular block, complete: Secondary | ICD-10-CM

## 2014-10-01 DIAGNOSIS — I10 Essential (primary) hypertension: Secondary | ICD-10-CM

## 2014-10-01 DIAGNOSIS — E785 Hyperlipidemia, unspecified: Secondary | ICD-10-CM | POA: Diagnosis not present

## 2014-10-01 LAB — CUP PACEART INCLINIC DEVICE CHECK
Battery Voltage: 2.99 V
Brady Statistic RA Percent Paced: 0.6 %
Brady Statistic RV Percent Paced: 99.94 %
Date Time Interrogation Session: 20160722133229
Lead Channel Impedance Value: 450 Ohm
Lead Channel Impedance Value: 512.5 Ohm
Lead Channel Pacing Threshold Amplitude: 0.5 V
Lead Channel Pacing Threshold Amplitude: 0.625 V
Lead Channel Pacing Threshold Pulse Width: 0.4 ms
Lead Channel Sensing Intrinsic Amplitude: 4.4 mV
Lead Channel Setting Pacing Amplitude: 0.875
Lead Channel Setting Pacing Amplitude: 2 V
Lead Channel Setting Sensing Sensitivity: 12.5 mV
MDC IDC MSMT BATTERY REMAINING LONGEVITY: 100.8 mo
MDC IDC MSMT LEADCHNL RA PACING THRESHOLD AMPLITUDE: 0.5 V
MDC IDC MSMT LEADCHNL RA PACING THRESHOLD PULSEWIDTH: 0.4 ms
MDC IDC MSMT LEADCHNL RV PACING THRESHOLD PULSEWIDTH: 0.4 ms
MDC IDC PG SERIAL: 7522008
MDC IDC SET LEADCHNL RV PACING PULSEWIDTH: 0.4 ms
Pulse Gen Model: 2240

## 2014-10-01 NOTE — Assessment & Plan Note (Signed)
Her [employer] DDD PM is working normally. Will recheck in several months. 

## 2014-10-01 NOTE — Assessment & Plan Note (Signed)
This was her only concern today. She has not lost much weight but does note that she does not feel like eating too much. Her diet is not too bad. I have encouraged her to eat sweats/fatty foods in moderation. Will follow.

## 2014-10-01 NOTE — Assessment & Plan Note (Signed)
She will continue her statin therapy. She is encouraged to maintain a low sodium diet.

## 2014-10-01 NOTE — Assessment & Plan Note (Signed)
Her blood pressure has been well controlled. No change in meds. She will maintain a low sodium diet.

## 2014-10-01 NOTE — Patient Instructions (Signed)
Medication Instructions:  Your physician recommends that you continue on your current medications as directed. Please refer to the Current Medication list given to you today.   Labwork: None ordered  Testing/Procedures: None ordered  Follow-Up: Your physician wants you to follow-up in: 12 months with Dr Knox Saliva will receive a reminder letter in the mail two months in advance. If you don't receive a letter, please call our office to schedule the follow-up appointment.  Remote monitoring is used to monitor your Pacemaker of ICD from home. This monitoring reduces the number of office visits required to check your device to one time per year. It allows [place] to keep an eye on the functioning of your device to ensure it is working properly. You are scheduled for a device check from home on 01/03/15. You may send your transmission at any time that day. If you have a wireless device, the transmission will be sent automatically. After your physician reviews your transmission, you will receive a postcard with your next transmission date.    Any Other Special Instructions Will Be Listed Below (If Applicable).

## 2014-10-01 NOTE — Progress Notes (Signed)
HPI Wendy Velasquez returns today for followup. She is a very pleasant 81 year old woman with symptomatic tachycardia bradycardia syndrome and complete heart block, status post permanent pacemaker insertion secondary to long pauses. She has done well in the interim. She denies chest pain, shortness of breath, or syncope. She notes rare episodes of peripheral edema but mostly she has felt well.  No Known Allergies   Current Outpatient Prescriptions  Medication Sig Dispense Refill  . albuterol (PROVENTIL) (2.5 MG/3ML) 0.083% nebulizer solution Take 2.5 mg by nebulization every 6 (six) hours as needed for wheezing or shortness of breath.    Marland Kitchen albuterol-ipratropium (COMBIVENT) 18-103 MCG/ACT inhaler Inhale 2 puffs into the lungs every 6 (six) hours as needed for wheezing.    Marland Kitchen aspirin EC 81 MG tablet Take 81 mg by mouth daily.    . Brinzolamide-Brimonidine (SIMBRINZA) 1-0.2 % SUSP Apply 1 drop to eye at bedtime.    Marland Kitchen buPROPion (WELLBUTRIN) 100 MG tablet Take 100 mg by mouth daily.    Marland Kitchen esomeprazole (NEXIUM) 40 MG capsule Take 40 mg by mouth daily before breakfast.    . Fluticasone-Salmeterol (ADVAIR DISKUS) 250-50 MCG/DOSE AEPB Inhale 1 puff into the lungs 2 (two) times daily.    Marland Kitchen ibandronate (BONIVA) 150 MG tablet Take 150 mg by mouth every 30 (thirty) days. Take in the morning with a full glass of water, on an empty stomach, and do not take anything else by mouth or lie down for the next 30 min.    . insulin glargine (LANTUS) 100 UNIT/ML injection Inject 12 Units into the skin at bedtime. 9 pm daily    . metFORMIN (GLUCOPHAGE) 1000 MG tablet Take 1,000 mg by mouth 2 (two) times daily with a meal.    . sertraline (ZOLOFT) 50 MG tablet Take 50 mg by mouth 2 (two) times daily.    . simvastatin (ZOCOR) 10 MG tablet Take 10 mg by mouth at bedtime.    . sitaGLIPtin (JANUVIA) 100 MG tablet Take 100 mg by mouth daily.    Marland Kitchen trimethoprim (TRIMPEX) 100 MG tablet Take 100 mg by mouth daily.     .  vitamin B-12 (CYANOCOBALAMIN) 1000 MCG tablet Take 1,000 mcg by mouth daily.     No current facility-administered medications for this visit.     Past Medical History  Diagnosis Date  . Hypertension   . COPD (chronic obstructive pulmonary disease)   . GERD (gastroesophageal reflux disease)   . Asthma   . Complete heart block   . Cardiac pacemaker in situ   . Type 2 diabetes mellitus   . Hyperlipidemia   . History of cardiac arrest     09-11-2012--  symptomatic complete heart block -- hr 20's--  cpr, intubated and temp. pacer until  perm. pacemaker placement  . Tachy-brady syndrome   . Diverticulosis of colon   . History of esophageal dilatation     for stricture 2007  . History of hiatal hernia   . Urgency incontinence   . History of iron deficiency anemia     hx transfusion's 15 yrs ago , approx 2001  . Peripheral neuropathy   . Wears glasses   . Glaucoma of both eyes     ROS:   All systems reviewed and negative except as noted in the HPI.   Past Surgical History  Procedure Laterality Date  . Interstim implant placement  2012  . Permanent pacemaker insertion N/A 09/11/2012    Procedure: PERMANENT PACEMAKER INSERTION;  Surgeon: Evans Lance, MD;  Location: [facility] CATH LAB;  Service: Cardiovascular;  Laterality: N/A;  St. Jude Dual Chamber  . Colonoscopy with esophagogastroduodenoscopy (egd)  last one 2007  . Laparotomy w/ unilateral salpingoophorectomy  1960's  approx  . Abdominal hysterectomy  1976  approx    w/ unilateral salpingoophorectomy  . Rectocele repair  1991  approx  . Cataract extraction w/ intraocular lens  implant, bilateral  1990  . Interstim implant removal N/A 09/21/2014    Procedure: REMOVAL OF INTERSTIM IMPLANT;  Surgeon: Bjorn Loser, MD;  Location: [facility];  Service: Urology;  Laterality: N/A;  . Interstim implant revision N/A 09/21/2014    Procedure: INTERSTIM STAGE ONE AND TWO;  Surgeon: Bjorn Loser, MD;  Location: [facility];  Service: Urology;  Laterality: N/A;     Family History  Problem Relation Age of Onset  . Prostatitis Father   . Heart failure Sister   . Breast cancer Sister   . Diabetic kidney disease Sister   . Melanoma Brother      History   Social History  . Marital Status: Married    Spouse Name: N/A  . Number of Children: N/A  . Years of Education: N/A   Occupational History  . Not on file.   Social History Main Topics  . Smoking status: Never Smoker   . Smokeless tobacco: Never Used  . Alcohol Use: No  . Drug Use: No  . Sexual Activity: Not on file   Other Topics Concern  . Not on file   Social History Narrative     BP 130/60 mmHg  Pulse 83  Ht 5' 4.5" (1.638 m)  Wt 133 lb (60.328 kg)  BMI 22.48 kg/m2  Physical Exam:  Well appearing elderly woman, NAD HEENT: Unremarkable Neck:  No JVD, no thyromegally Back:  No CVA tenderness Lungs:  Clear with no wheezes, rales, or rhonchi. Well-healed pacemaker incision. HEART:  Regular rate rhythm, no murmurs, no rubs, no clicks Abd:  soft, positive bowel sounds, no organomegally, no rebound, no guarding Ext:  2 plus pulses, no edema, no cyanosis, no clubbing Skin:  No rashes no nodules Neuro:  CN II through XII intact, motor grossly intact  DEVICE  Normal device function.  See PaceArt for details.   Assess/Plan:

## 2014-10-28 DIAGNOSIS — Z950 Presence of cardiac pacemaker: Secondary | ICD-10-CM | POA: Diagnosis not present

## 2014-10-28 DIAGNOSIS — Z6823 Body mass index (BMI) 23.0-23.9, adult: Secondary | ICD-10-CM | POA: Diagnosis not present

## 2014-10-28 DIAGNOSIS — K219 Gastro-esophageal reflux disease without esophagitis: Secondary | ICD-10-CM | POA: Diagnosis not present

## 2014-10-28 DIAGNOSIS — M810 Age-related osteoporosis without current pathological fracture: Secondary | ICD-10-CM | POA: Diagnosis not present

## 2014-10-28 DIAGNOSIS — D508 Other iron deficiency anemias: Secondary | ICD-10-CM | POA: Diagnosis not present

## 2014-10-28 DIAGNOSIS — E785 Hyperlipidemia, unspecified: Secondary | ICD-10-CM | POA: Diagnosis not present

## 2014-10-28 DIAGNOSIS — E538 Deficiency of other specified B group vitamins: Secondary | ICD-10-CM | POA: Diagnosis not present

## 2014-10-28 DIAGNOSIS — J449 Chronic obstructive pulmonary disease, unspecified: Secondary | ICD-10-CM | POA: Diagnosis not present

## 2014-10-28 DIAGNOSIS — F329 Major depressive disorder, single episode, unspecified: Secondary | ICD-10-CM | POA: Diagnosis not present

## 2014-10-28 DIAGNOSIS — E1129 Type 2 diabetes mellitus with other diabetic kidney complication: Secondary | ICD-10-CM | POA: Diagnosis not present

## 2014-10-28 DIAGNOSIS — N3281 Overactive bladder: Secondary | ICD-10-CM | POA: Diagnosis not present

## 2014-10-28 DIAGNOSIS — E559 Vitamin D deficiency, unspecified: Secondary | ICD-10-CM | POA: Diagnosis not present

## 2014-10-28 DIAGNOSIS — R7301 Impaired fasting glucose: Secondary | ICD-10-CM | POA: Diagnosis not present

## 2014-11-22 DIAGNOSIS — H4011X2 Primary open-angle glaucoma, moderate stage: Secondary | ICD-10-CM | POA: Diagnosis not present

## 2014-12-13 DIAGNOSIS — N3946 Mixed incontinence: Secondary | ICD-10-CM | POA: Diagnosis not present

## 2014-12-13 DIAGNOSIS — R350 Frequency of micturition: Secondary | ICD-10-CM | POA: Diagnosis not present

## 2014-12-20 DIAGNOSIS — Z23 Encounter for immunization: Secondary | ICD-10-CM | POA: Diagnosis not present

## 2014-12-27 DIAGNOSIS — N390 Urinary tract infection, site not specified: Secondary | ICD-10-CM | POA: Diagnosis not present

## 2014-12-27 DIAGNOSIS — R300 Dysuria: Secondary | ICD-10-CM | POA: Diagnosis not present

## 2015-01-01 IMAGING — CR DG RIBS W/ CHEST 3+V*L*
4 series · 4 of 4 positions shown · non-contrast
Comparison: 09/12/2012

CLINICAL DATA: evaluate rib fracture the

LEFT RIBS AND CHEST - 3+ VIEW

[t chest supine]
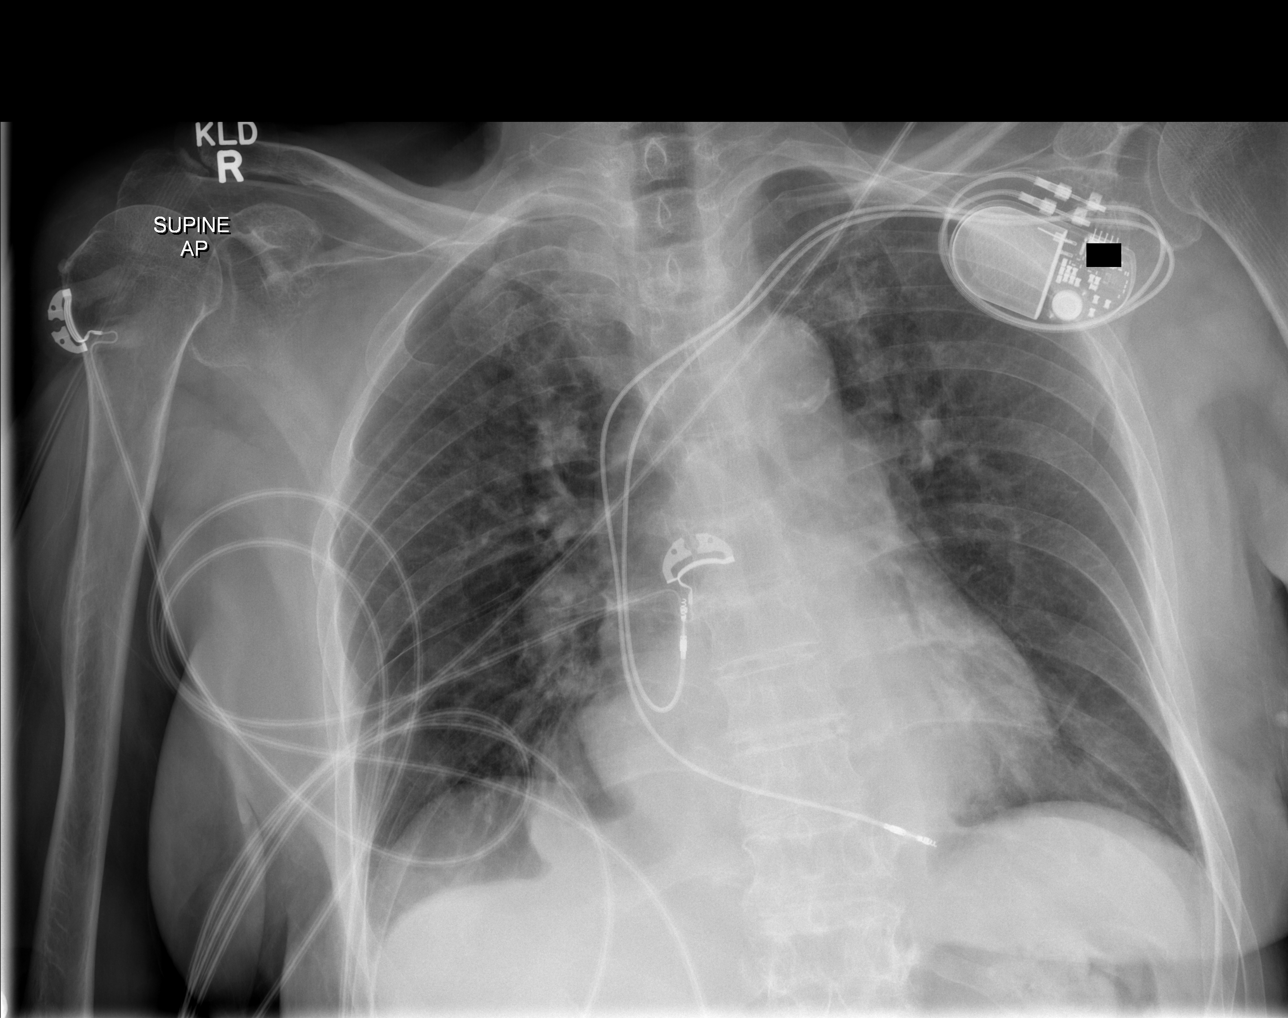

[t ribs ap upper left]
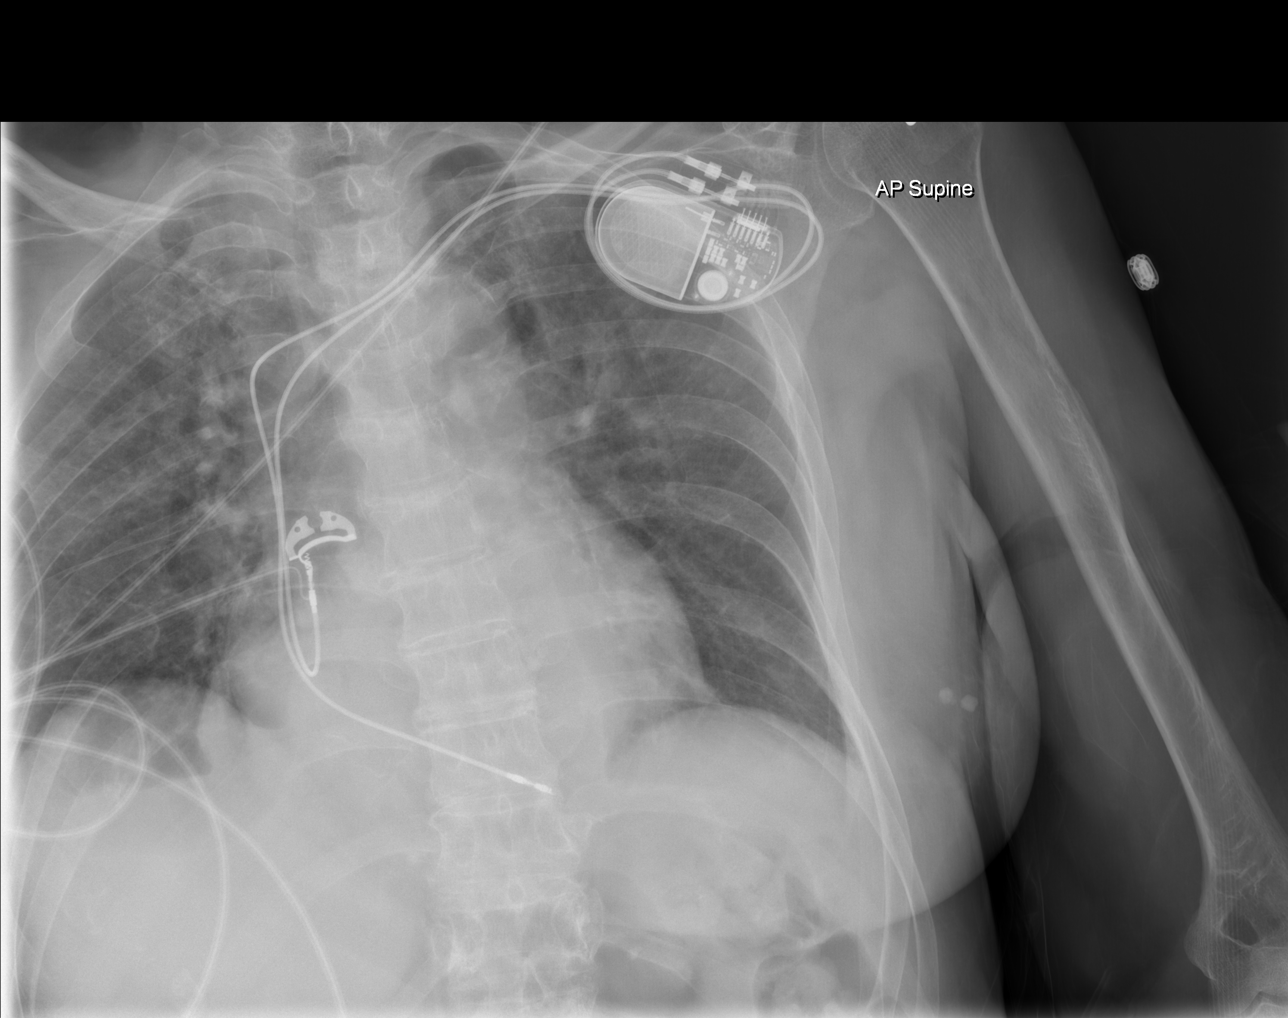

[t ribs ap lower left]
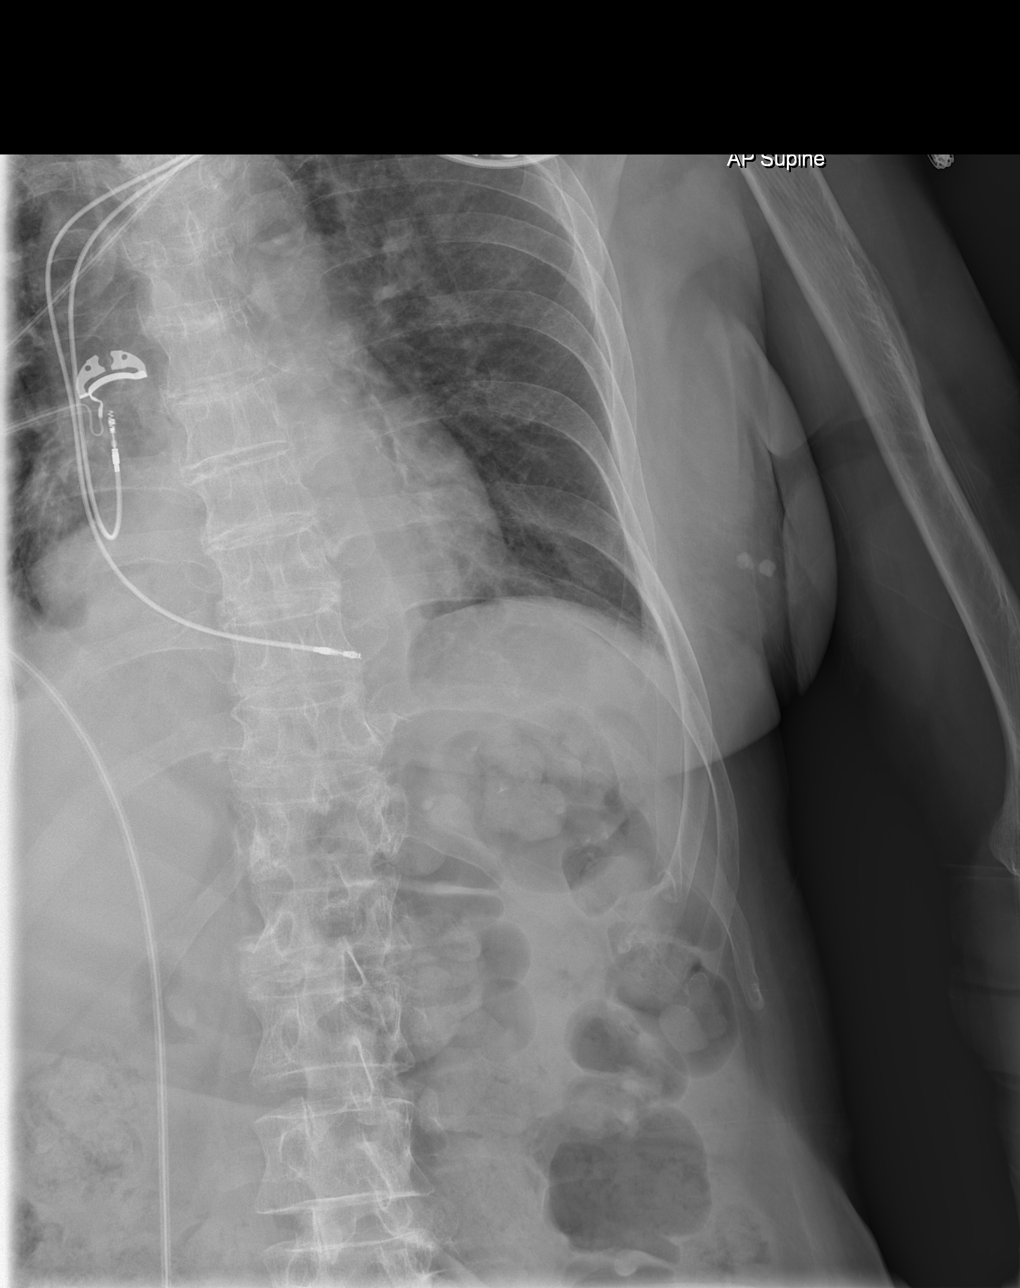

[t ribs lpo left]
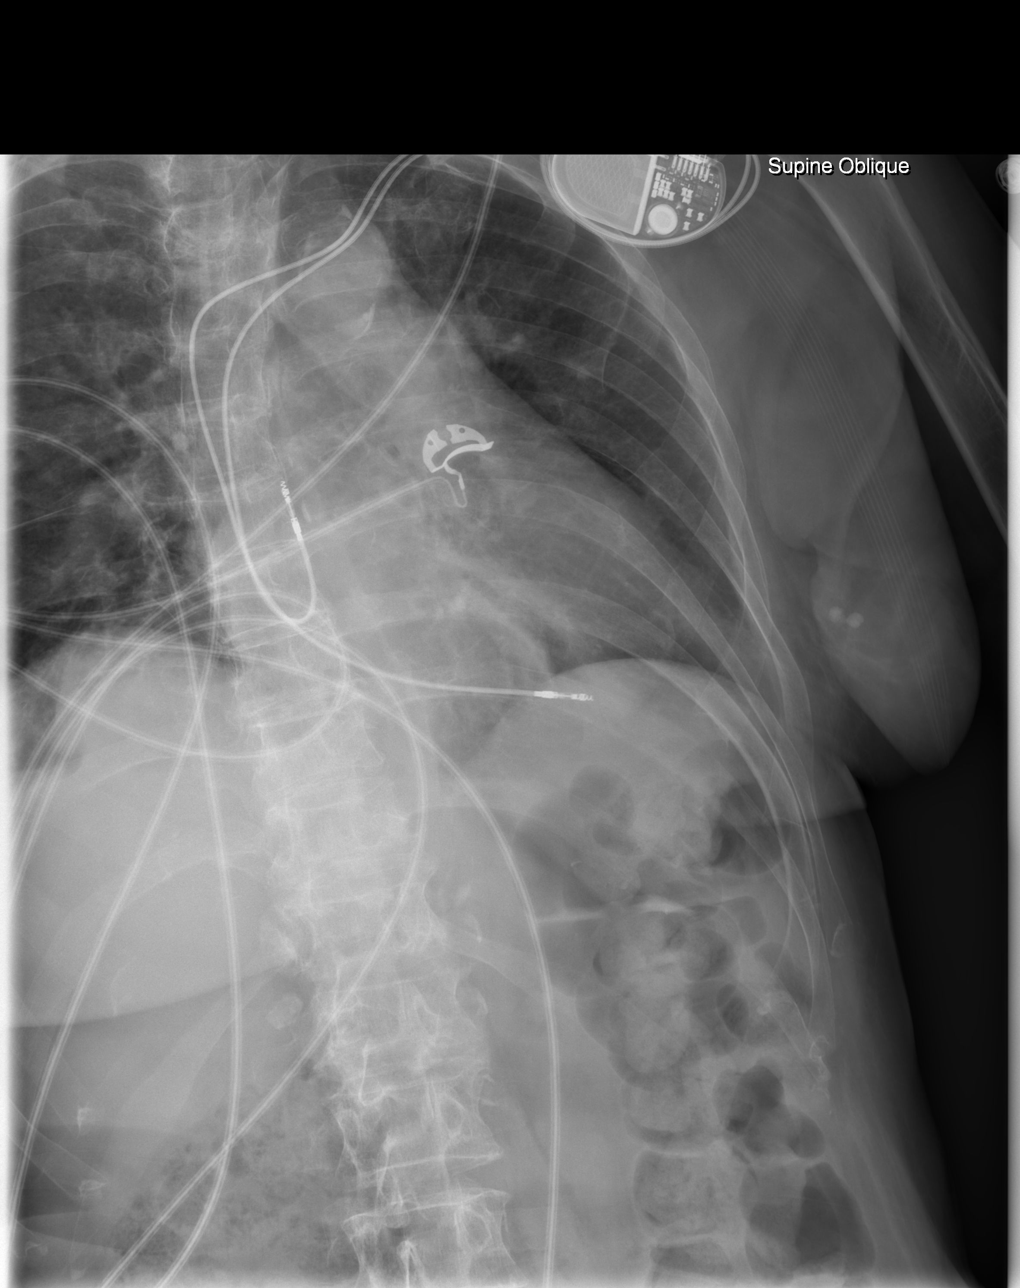

[4 of 4 positions shown; findings below may reference images not displayed]

FINDINGS: Left chest wall pacer device is noted with lower leads in
the right atrial appendage and right ventricle.  Heart size is
normal. No pleural effusion or edema noted.  Chronic interstitial
coarsening identified.  Large hiatal hernia is identified.  No
displaced rib fractures are identified.
IMPRESSION: 1.  Hiatal hernia.
2.  No displaced rib fractures noted

## 2015-01-03 ENCOUNTER — Ambulatory Visit (INDEPENDENT_AMBULATORY_CARE_PROVIDER_SITE_OTHER): Payer: Medicare Other | Admitting: *Deleted

## 2015-01-03 DIAGNOSIS — I442 Atrioventricular block, complete: Secondary | ICD-10-CM

## 2015-01-04 NOTE — Progress Notes (Signed)
Remote pacemaker transmission.   

## 2015-01-05 ENCOUNTER — Ambulatory Visit
Admission: RE | Admit: 2015-01-05 | Discharge: 2015-01-05 | Disposition: A | Discharge status: Home / Self Care | Payer: Medicare Other | Source: Ambulatory Visit | Attending: Internal Medicine | Admitting: Internal Medicine

## 2015-01-05 ENCOUNTER — Other Ambulatory Visit: Payer: Self-pay | Admitting: Internal Medicine

## 2015-01-05 DIAGNOSIS — J449 Chronic obstructive pulmonary disease, unspecified: Secondary | ICD-10-CM

## 2015-01-05 DIAGNOSIS — R059 Cough, unspecified: Secondary | ICD-10-CM

## 2015-01-05 DIAGNOSIS — D508 Other iron deficiency anemias: Secondary | ICD-10-CM | POA: Diagnosis not present

## 2015-01-05 DIAGNOSIS — R05 Cough: Secondary | ICD-10-CM | POA: Diagnosis not present

## 2015-01-05 DIAGNOSIS — R5383 Other fatigue: Secondary | ICD-10-CM | POA: Diagnosis not present

## 2015-01-05 DIAGNOSIS — R062 Wheezing: Secondary | ICD-10-CM

## 2015-01-05 DIAGNOSIS — J45901 Unspecified asthma with (acute) exacerbation: Secondary | ICD-10-CM | POA: Diagnosis not present

## 2015-01-05 DIAGNOSIS — Z6822 Body mass index (BMI) 22.0-22.9, adult: Secondary | ICD-10-CM | POA: Diagnosis not present

## 2015-01-06 ENCOUNTER — Encounter: Payer: Self-pay | Admitting: Cardiology

## 2015-01-06 LAB — CUP PACEART REMOTE DEVICE CHECK
Battery Remaining Longevity: 127 mo
Battery Remaining Percentage: 95.5 %
Brady Statistic AP VS Percent: 0 %
Brady Statistic AS VS Percent: 1 %
Brady Statistic RA Percent Paced: 1 %
Brady Statistic RV Percent Paced: 99 %
Date Time Interrogation Session: 20161024080013
Implantable Lead Location: 753860
Lead Channel Setting Pacing Amplitude: 0.875
Lead Channel Setting Pacing Amplitude: 2 V
MDC IDC LEAD IMPLANT DT: 20140703
MDC IDC LEAD IMPLANT DT: 20140703
MDC IDC LEAD LOCATION: 753859
MDC IDC MSMT BATTERY VOLTAGE: 2.98 V
MDC IDC MSMT LEADCHNL RA IMPEDANCE VALUE: 440 Ohm
MDC IDC MSMT LEADCHNL RA PACING THRESHOLD AMPLITUDE: 0.5 V
MDC IDC MSMT LEADCHNL RA PACING THRESHOLD PULSEWIDTH: 0.4 ms
MDC IDC MSMT LEADCHNL RA SENSING INTR AMPL: 2.6 mV
MDC IDC MSMT LEADCHNL RV IMPEDANCE VALUE: 460 Ohm
MDC IDC MSMT LEADCHNL RV PACING THRESHOLD AMPLITUDE: 0.625 V
MDC IDC MSMT LEADCHNL RV PACING THRESHOLD PULSEWIDTH: 0.4 ms
MDC IDC MSMT LEADCHNL RV SENSING INTR AMPL: 12 mV
MDC IDC SET LEADCHNL RV PACING PULSEWIDTH: 0.4 ms
MDC IDC SET LEADCHNL RV SENSING SENSITIVITY: 12.5 mV
MDC IDC STAT BRADY AP VP PERCENT: 1 %
MDC IDC STAT BRADY AS VP PERCENT: 99 %
Pulse Gen Model: 2240
Pulse Gen Serial Number: 7522008

## 2015-01-11 DIAGNOSIS — D508 Other iron deficiency anemias: Secondary | ICD-10-CM | POA: Diagnosis not present

## 2015-01-14 ENCOUNTER — Other Ambulatory Visit (HOSPITAL_COMMUNITY): Payer: Self-pay | Admitting: *Deleted

## 2015-01-17 ENCOUNTER — Encounter (HOSPITAL_COMMUNITY)
Admission: RE | Admit: 2015-01-17 | Discharge: 2015-01-17 | Disposition: A | Discharge status: Home / Self Care | Payer: Medicare Other | Source: Ambulatory Visit | Attending: Internal Medicine | Admitting: Internal Medicine

## 2015-01-17 DIAGNOSIS — D649 Anemia, unspecified: Secondary | ICD-10-CM | POA: Diagnosis not present

## 2015-01-17 MED ORDER — FERUMOXYTOL INJECTION 510 MG/17 ML
510.0000 mg | INTRAVENOUS | Status: DC
Start: 2015-01-17 — End: 2015-01-18
  Administered 2015-01-17: 510 mg via INTRAVENOUS
  Filled 2015-01-17: qty 17

## 2015-01-24 ENCOUNTER — Encounter (HOSPITAL_COMMUNITY)
Admission: RE | Admit: 2015-01-24 | Discharge: 2015-01-24 | Disposition: A | Discharge status: Home / Self Care | Payer: Medicare Other | Source: Ambulatory Visit | Attending: Internal Medicine | Admitting: Internal Medicine

## 2015-01-24 DIAGNOSIS — D649 Anemia, unspecified: Secondary | ICD-10-CM | POA: Diagnosis not present

## 2015-01-24 MED ORDER — SODIUM CHLORIDE 0.9 % IV SOLN
510.0000 mg | INTRAVENOUS | Status: AC
  Administered 2015-01-24: 510 mg via INTRAVENOUS
  Filled 2015-01-24: qty 17

## 2015-02-01 DIAGNOSIS — E1129 Type 2 diabetes mellitus with other diabetic kidney complication: Secondary | ICD-10-CM | POA: Diagnosis not present

## 2015-02-01 DIAGNOSIS — Z1389 Encounter for screening for other disorder: Secondary | ICD-10-CM | POA: Diagnosis not present

## 2015-02-01 DIAGNOSIS — M810 Age-related osteoporosis without current pathological fracture: Secondary | ICD-10-CM | POA: Diagnosis not present

## 2015-02-01 DIAGNOSIS — E784 Other hyperlipidemia: Secondary | ICD-10-CM | POA: Diagnosis not present

## 2015-02-01 DIAGNOSIS — Z6823 Body mass index (BMI) 23.0-23.9, adult: Secondary | ICD-10-CM | POA: Diagnosis not present

## 2015-02-01 DIAGNOSIS — E559 Vitamin D deficiency, unspecified: Secondary | ICD-10-CM | POA: Diagnosis not present

## 2015-02-01 DIAGNOSIS — J449 Chronic obstructive pulmonary disease, unspecified: Secondary | ICD-10-CM | POA: Diagnosis not present

## 2015-02-01 DIAGNOSIS — F329 Major depressive disorder, single episode, unspecified: Secondary | ICD-10-CM | POA: Diagnosis not present

## 2015-02-01 DIAGNOSIS — K219 Gastro-esophageal reflux disease without esophagitis: Secondary | ICD-10-CM | POA: Diagnosis not present

## 2015-02-01 DIAGNOSIS — D508 Other iron deficiency anemias: Secondary | ICD-10-CM | POA: Diagnosis not present

## 2015-02-14 DIAGNOSIS — J45909 Unspecified asthma, uncomplicated: Secondary | ICD-10-CM | POA: Diagnosis not present

## 2015-02-14 DIAGNOSIS — Z6823 Body mass index (BMI) 23.0-23.9, adult: Secondary | ICD-10-CM | POA: Diagnosis not present

## 2015-02-14 DIAGNOSIS — J019 Acute sinusitis, unspecified: Secondary | ICD-10-CM | POA: Diagnosis not present

## 2015-02-14 DIAGNOSIS — E1129 Type 2 diabetes mellitus with other diabetic kidney complication: Secondary | ICD-10-CM | POA: Diagnosis not present

## 2015-02-24 ENCOUNTER — Inpatient Hospital Stay (HOSPITAL_COMMUNITY)
Admission: EM | Admit: 2015-02-24 | Discharge: 2015-03-01 | DRG: 871 | Disposition: A | Discharge status: Home / Self Care | Payer: Medicare Other | Attending: Internal Medicine | Admitting: Internal Medicine

## 2015-02-24 ENCOUNTER — Emergency Department (HOSPITAL_COMMUNITY): Payer: Medicare Other

## 2015-02-24 ENCOUNTER — Encounter (HOSPITAL_COMMUNITY): Payer: Self-pay | Admitting: Nurse Practitioner

## 2015-02-24 DIAGNOSIS — E785 Hyperlipidemia, unspecified: Secondary | ICD-10-CM | POA: Diagnosis not present

## 2015-02-24 DIAGNOSIS — Z8249 Family history of ischemic heart disease and other diseases of the circulatory system: Secondary | ICD-10-CM | POA: Diagnosis not present

## 2015-02-24 DIAGNOSIS — R5381 Other malaise: Secondary | ICD-10-CM | POA: Insufficient documentation

## 2015-02-24 DIAGNOSIS — Z841 Family history of disorders of kidney and ureter: Secondary | ICD-10-CM

## 2015-02-24 DIAGNOSIS — J441 Chronic obstructive pulmonary disease with (acute) exacerbation: Secondary | ICD-10-CM | POA: Insufficient documentation

## 2015-02-24 DIAGNOSIS — Z6821 Body mass index (BMI) 21.0-21.9, adult: Secondary | ICD-10-CM | POA: Diagnosis not present

## 2015-02-24 DIAGNOSIS — Z950 Presence of cardiac pacemaker: Secondary | ICD-10-CM | POA: Diagnosis not present

## 2015-02-24 DIAGNOSIS — A419 Sepsis, unspecified organism: Secondary | ICD-10-CM | POA: Diagnosis not present

## 2015-02-24 DIAGNOSIS — I10 Essential (primary) hypertension: Secondary | ICD-10-CM | POA: Diagnosis present

## 2015-02-24 DIAGNOSIS — K449 Diaphragmatic hernia without obstruction or gangrene: Secondary | ICD-10-CM | POA: Diagnosis present

## 2015-02-24 DIAGNOSIS — E1165 Type 2 diabetes mellitus with hyperglycemia: Secondary | ICD-10-CM | POA: Diagnosis not present

## 2015-02-24 DIAGNOSIS — K219 Gastro-esophageal reflux disease without esophagitis: Secondary | ICD-10-CM | POA: Diagnosis not present

## 2015-02-24 DIAGNOSIS — E43 Unspecified severe protein-calorie malnutrition: Secondary | ICD-10-CM | POA: Diagnosis not present

## 2015-02-24 DIAGNOSIS — Z8674 Personal history of sudden cardiac arrest: Secondary | ICD-10-CM

## 2015-02-24 DIAGNOSIS — Z794 Long term (current) use of insulin: Secondary | ICD-10-CM | POA: Diagnosis not present

## 2015-02-24 DIAGNOSIS — J440 Chronic obstructive pulmonary disease with acute lower respiratory infection: Secondary | ICD-10-CM | POA: Diagnosis present

## 2015-02-24 DIAGNOSIS — G629 Polyneuropathy, unspecified: Secondary | ICD-10-CM | POA: Diagnosis present

## 2015-02-24 DIAGNOSIS — F329 Major depressive disorder, single episode, unspecified: Secondary | ICD-10-CM | POA: Diagnosis present

## 2015-02-24 DIAGNOSIS — Y95 Nosocomial condition: Secondary | ICD-10-CM | POA: Diagnosis present

## 2015-02-24 DIAGNOSIS — E119 Type 2 diabetes mellitus without complications: Secondary | ICD-10-CM

## 2015-02-24 DIAGNOSIS — H409 Unspecified glaucoma: Secondary | ICD-10-CM | POA: Diagnosis present

## 2015-02-24 DIAGNOSIS — R509 Fever, unspecified: Secondary | ICD-10-CM | POA: Diagnosis not present

## 2015-02-24 DIAGNOSIS — J189 Pneumonia, unspecified organism: Secondary | ICD-10-CM | POA: Diagnosis present

## 2015-02-24 DIAGNOSIS — F32A Depression, unspecified: Secondary | ICD-10-CM | POA: Insufficient documentation

## 2015-02-24 LAB — URINALYSIS, ROUTINE W REFLEX MICROSCOPIC
BILIRUBIN URINE: NEGATIVE
Glucose, UA: NEGATIVE mg/dL
Hgb urine dipstick: NEGATIVE
KETONES UR: 15 mg/dL — AB
Nitrite: POSITIVE — AB
PH: 5.5 (ref 5.0–8.0)
Protein, ur: 30 mg/dL — AB
SPECIFIC GRAVITY, URINE: 1.025 (ref 1.005–1.030)

## 2015-02-24 LAB — BASIC METABOLIC PANEL
ANION GAP: 12 (ref 5–15)
BUN: 30 mg/dL — ABNORMAL HIGH (ref 6–20)
CHLORIDE: 101 mmol/L (ref 101–111)
CO2: 25 mmol/L (ref 22–32)
Calcium: 9.2 mg/dL (ref 8.9–10.3)
Creatinine, Ser: 0.96 mg/dL (ref 0.44–1.00)
GFR calc non Af Amer: 52 mL/min — ABNORMAL LOW (ref >60)
GFR, EST AFRICAN AMERICAN: 60 mL/min — AB (ref >60)
Glucose, Bld: 287 mg/dL — ABNORMAL HIGH (ref 65–99)
Potassium: 4.4 mmol/L (ref 3.5–5.1)
SODIUM: 138 mmol/L (ref 135–145)

## 2015-02-24 LAB — CBC WITH DIFFERENTIAL/PLATELET
BASOS PCT: 0 %
Basophils Absolute: 0 10*3/uL (ref 0.0–0.1)
Eosinophils Absolute: 0 10*3/uL (ref 0.0–0.7)
Eosinophils Relative: 0 %
HEMATOCRIT: 35.7 % — AB (ref 36.0–46.0)
HEMOGLOBIN: 11.6 g/dL — AB (ref 12.0–15.0)
Lymphocytes Relative: 5 %
Lymphs Abs: 0.9 10*3/uL (ref 0.7–4.0)
MCH: 27.8 pg (ref 26.0–34.0)
MCHC: 32.5 g/dL (ref 30.0–36.0)
MCV: 85.6 fL (ref 78.0–100.0)
Monocytes Absolute: 1.1 10*3/uL — ABNORMAL HIGH (ref 0.1–1.0)
Monocytes Relative: 6 %
NEUTROS ABS: 17.6 10*3/uL — AB (ref 1.7–7.7)
Neutrophils Relative %: 89 %
Platelets: 259 10*3/uL (ref 150–400)
RBC: 4.17 MIL/uL (ref 3.87–5.11)
RDW: 18.5 % — ABNORMAL HIGH (ref 11.5–15.5)
WBC: 19.6 10*3/uL — AB (ref 4.0–10.5)

## 2015-02-24 LAB — URINE MICROSCOPIC-ADD ON: Squamous Epithelial / LPF: NONE SEEN

## 2015-02-24 LAB — I-STAT CG4 LACTIC ACID, ED: Lactic Acid, Venous: 1.9 mmol/L (ref 0.5–2.0)

## 2015-02-24 LAB — GLUCOSE, CAPILLARY: Glucose-Capillary: 244 mg/dL — ABNORMAL HIGH (ref 65–99)

## 2015-02-24 MED ORDER — DEXTROSE 5 % IV SOLN
1.0000 g | Freq: Two times a day (BID) | INTRAVENOUS | Status: DC
  Administered 2015-02-25 – 2015-02-28 (×8): 1 g via INTRAVENOUS
  Filled 2015-02-24 (×8): qty 1

## 2015-02-24 MED ORDER — ACETAMINOPHEN 500 MG PO TABS
1000.0000 mg | ORAL_TABLET | Freq: Four times a day (QID) | ORAL | Status: DC | PRN
  Administered 2015-02-25: 1000 mg via ORAL
  Filled 2015-02-24: qty 2

## 2015-02-24 MED ORDER — BUPROPION HCL 100 MG PO TABS
100.0000 mg | ORAL_TABLET | Freq: Every day | ORAL | Status: DC
  Administered 2015-02-25 – 2015-03-01 (×5): 100 mg via ORAL
  Filled 2015-02-24 (×5): qty 1

## 2015-02-24 MED ORDER — IPRATROPIUM-ALBUTEROL 0.5-2.5 (3) MG/3ML IN SOLN
3.0000 mL | Freq: Four times a day (QID) | RESPIRATORY_TRACT | Status: DC | PRN
  Administered 2015-02-28: 3 mL via RESPIRATORY_TRACT
  Filled 2015-02-24: qty 3

## 2015-02-24 MED ORDER — IBANDRONATE SODIUM 150 MG PO TABS: 150.0000 mg | ORAL_TABLET | ORAL | Status: DC

## 2015-02-24 MED ORDER — SIMVASTATIN 10 MG PO TABS
10.0000 mg | ORAL_TABLET | Freq: Every day | ORAL | Status: DC
  Administered 2015-02-25 – 2015-02-28 (×4): 10 mg via ORAL
  Filled 2015-02-24 (×7): qty 1

## 2015-02-24 MED ORDER — VANCOMYCIN HCL IN DEXTROSE 750-5 MG/150ML-% IV SOLN
750.0000 mg | Freq: Every day | INTRAVENOUS | Status: DC
  Administered 2015-02-25 – 2015-02-27 (×4): 750 mg via INTRAVENOUS
  Filled 2015-02-24 (×5): qty 150

## 2015-02-24 MED ORDER — INSULIN GLARGINE 100 UNIT/ML ~~LOC~~ SOLN: 12.0000 [IU] | Freq: Every day | SUBCUTANEOUS | Status: DC

## 2015-02-24 MED ORDER — VITAMIN B-12 1000 MCG PO TABS
1000.0000 ug | ORAL_TABLET | Freq: Every day | ORAL | Status: DC
  Administered 2015-02-25 – 2015-02-28 (×4): 1000 ug via ORAL
  Filled 2015-02-24 (×4): qty 1

## 2015-02-24 MED ORDER — SODIUM CHLORIDE 0.9 % IV BOLUS (SEPSIS)
1000.0000 mL | Freq: Once | INTRAVENOUS | Status: AC
  Administered 2015-02-24: 1000 mL via INTRAVENOUS

## 2015-02-24 MED ORDER — PANTOPRAZOLE SODIUM 40 MG PO TBEC
80.0000 mg | DELAYED_RELEASE_TABLET | Freq: Every day | ORAL | Status: DC
  Administered 2015-02-25 – 2015-03-01 (×5): 80 mg via ORAL
  Filled 2015-02-24 (×5): qty 2

## 2015-02-24 MED ORDER — MOMETASONE FURO-FORMOTEROL FUM 100-5 MCG/ACT IN AERO
2.0000 | INHALATION_SPRAY | Freq: Two times a day (BID) | RESPIRATORY_TRACT | Status: DC
  Filled 2015-02-24: qty 8.8

## 2015-02-24 MED ORDER — ALBUTEROL SULFATE (2.5 MG/3ML) 0.083% IN NEBU
2.5000 mg | INHALATION_SOLUTION | Freq: Four times a day (QID) | RESPIRATORY_TRACT | Status: DC | PRN
  Administered 2015-02-25 – 2015-02-27 (×2): 2.5 mg via RESPIRATORY_TRACT
  Filled 2015-02-24 (×2): qty 3

## 2015-02-24 MED ORDER — METFORMIN HCL 500 MG PO TABS
1000.0000 mg | ORAL_TABLET | Freq: Two times a day (BID) | ORAL | Status: DC
  Filled 2015-02-24 (×2): qty 2

## 2015-02-24 MED ORDER — LORATADINE 10 MG PO TABS
10.0000 mg | ORAL_TABLET | Freq: Every day | ORAL | Status: DC
  Administered 2015-02-25 – 2015-03-01 (×5): 10 mg via ORAL
  Filled 2015-02-24 (×5): qty 1

## 2015-02-24 MED ORDER — SERTRALINE HCL 50 MG PO TABS
50.0000 mg | ORAL_TABLET | Freq: Two times a day (BID) | ORAL | Status: DC
  Administered 2015-02-25 – 2015-03-01 (×9): 50 mg via ORAL
  Filled 2015-02-24 (×12): qty 1

## 2015-02-24 MED ORDER — MONTELUKAST SODIUM 10 MG PO TABS
10.0000 mg | ORAL_TABLET | Freq: Every day | ORAL | Status: DC
  Administered 2015-02-25 – 2015-02-28 (×4): 10 mg via ORAL
  Filled 2015-02-24 (×6): qty 1

## 2015-02-24 MED ORDER — ASPIRIN EC 81 MG PO TBEC
81.0000 mg | DELAYED_RELEASE_TABLET | Freq: Every day | ORAL | Status: DC
  Administered 2015-02-25 – 2015-03-01 (×5): 81 mg via ORAL
  Filled 2015-02-24 (×5): qty 1

## 2015-02-24 MED ORDER — HEPARIN SODIUM (PORCINE) 5000 UNIT/ML IJ SOLN
5000.0000 [IU] | Freq: Three times a day (TID) | INTRAMUSCULAR | Status: DC
  Administered 2015-02-25 – 2015-03-01 (×14): 5000 [IU] via SUBCUTANEOUS
  Filled 2015-02-24 (×16): qty 1

## 2015-02-24 MED ORDER — LEVOFLOXACIN IN D5W 750 MG/150ML IV SOLN
750.0000 mg | Freq: Once | INTRAVENOUS | Status: DC
Start: 2015-02-24 — End: 2015-02-24

## 2015-02-24 NOTE — ED Notes (Signed)
Pt presented by family who report patient has been very weak, today they noticed she has a fever, they gave 1000 mg of tylenol at 1900hrs today. Pt ongoing outpatient treatment for pneumonia per family.

## 2015-02-24 NOTE — ED Provider Notes (Signed)
CSN: [id number]     Arrival date & time 02/24/15  2023 History   First MD Initiated Contact with Patient 02/24/15 2037     Chief Complaint  Patient presents with  . Fever  . Fatigue     (Consider location/radiation/quality/duration/timing/severity/associated sxs/prior Treatment) Patient is a 81 y.o. female presenting with cough. The history is provided by the patient and a relative.  Cough Cough characteristics:  Productive Sputum characteristics:  Yellow Severity:  Moderate Onset quality:  Gradual Duration:  2 weeks Timing:  Constant Progression:  Worsening Chronicity:  Recurrent Smoker: no   Context: upper respiratory infection   Relieved by:  Nothing Ineffective treatments: antibiotics. Associated symptoms: fever and shortness of breath     Past Medical History  Diagnosis Date  . Hypertension   . COPD (chronic obstructive pulmonary disease) ([place])   . GERD (gastroesophageal reflux disease)   . Asthma   . Complete heart block ([place])   . Cardiac pacemaker in situ   . Type 2 diabetes mellitus ([place])   . Hyperlipidemia   . History of cardiac arrest     09-11-2012--  symptomatic complete heart block -- hr 20's--  cpr, intubated and temp. pacer until  perm. pacemaker placement  . Tachy-brady syndrome ([place])   . Diverticulosis of colon   . History of esophageal dilatation     for stricture 2007  . History of hiatal hernia   . Urgency incontinence   . History of iron deficiency anemia     hx transfusion's 15 yrs ago , approx 2001  . Peripheral neuropathy ([place])   . Wears glasses   . Glaucoma of both eyes    Past Surgical History  Procedure Laterality Date  . Interstim implant placement  2012  . Permanent pacemaker insertion N/A 09/11/2012    Procedure: PERMANENT PACEMAKER INSERTION;  Surgeon: Evans Lance, MD;  Location: [facility] CATH LAB;  Service: Cardiovascular;  Laterality: N/A;  St. Jude Dual Chamber  . Colonoscopy with esophagogastroduodenoscopy (egd)  last one 2007   . Laparotomy w/ unilateral salpingoophorectomy  1960's  approx  . Abdominal hysterectomy  1976  approx    w/ unilateral salpingoophorectomy  . Rectocele repair  1991  approx  . Cataract extraction w/ intraocular lens  implant, bilateral  1990  . Interstim implant removal N/A 09/21/2014    Procedure: REMOVAL OF INTERSTIM IMPLANT;  Surgeon: Bjorn Loser, MD;  Location: [facility];  Service: Urology;  Laterality: N/A;  . Interstim implant revision N/A 09/21/2014    Procedure: INTERSTIM STAGE ONE AND TWO;  Surgeon: Bjorn Loser, MD;  Location: [facility];  Service: Urology;  Laterality: N/A;   Family History  Problem Relation Age of Onset  . Prostatitis Father   . Heart failure Sister   . Breast cancer Sister   . Diabetic kidney disease Sister   . Melanoma Brother    Social History  Substance Use Topics  . Smoking status: Never Smoker   . Smokeless tobacco: Never Used  . Alcohol Use: No   OB History    No data available     Review of Systems  Constitutional: Positive for fever.  Respiratory: Positive for cough and shortness of breath.   All other systems reviewed and are negative.     Allergies  Review of patient's allergies indicates no known allergies.  Home Medications   Prior to Admission medications   Medication Sig Start Date End Date Taking? Authorizing Provider  acetaminophen (TYLENOL) 500  MG tablet Take 1,000 mg by mouth every 6 (six) hours as needed for fever.   Yes Historical Provider, MD  albuterol (PROVENTIL) (2.5 MG/3ML) 0.083% nebulizer solution Take 2.5 mg by nebulization every 6 (six) hours as needed for wheezing or shortness of breath.   Yes Historical Provider, MD  albuterol-ipratropium (COMBIVENT) 18-103 MCG/ACT inhaler Inhale 2 puffs into the lungs every 6 (six) hours as needed for wheezing.   Yes Historical Provider, MD  aspirin EC 81 MG tablet Take 81 mg by mouth daily.   Yes Historical Provider, MD   Brinzolamide-Brimonidine [facility]) 1-0.2 % SUSP Apply 1 drop to eye at bedtime.   Yes Historical Provider, MD  buPROPion (WELLBUTRIN) 100 MG tablet Take 100 mg by mouth daily.   Yes Historical Provider, MD  cetirizine (ZYRTEC) 10 MG tablet Take 10 mg by mouth daily.   Yes Historical Provider, MD  esomeprazole (NEXIUM) 40 MG capsule Take 40 mg by mouth daily before breakfast.   Yes Historical Provider, MD  fluticasone (FLONASE) 50 MCG/ACT nasal spray SHAKE LQ AND U 2 SPRAYS IEN D 02/14/15  Yes Historical Provider, MD  Fluticasone-Salmeterol (ADVAIR DISKUS) 250-50 MCG/DOSE AEPB Inhale 1 puff into the lungs 2 (two) times daily.   Yes Historical Provider, MD  ibandronate (BONIVA) 150 MG tablet Take 150 mg by mouth every 30 (thirty) days. Take in the morning with a full glass of water, on an empty stomach, and do not take anything else by mouth or lie down for the next 30 min.   Yes Historical Provider, MD  insulin glargine (LANTUS) 100 UNIT/ML injection Inject 12 Units into the skin at bedtime. 9 pm daily   Yes Historical Provider, MD  metFORMIN (GLUCOPHAGE) 1000 MG tablet Take 1,000 mg by mouth 2 (two) times daily with a meal.   Yes Historical Provider, MD  montelukast (SINGULAIR) 10 MG tablet Take 10 mg by mouth at bedtime.   Yes Historical Provider, MD  sertraline (ZOLOFT) 50 MG tablet Take 50 mg by mouth 2 (two) times daily.   Yes Historical Provider, MD  simvastatin (ZOCOR) 10 MG tablet Take 10 mg by mouth at bedtime.   Yes Historical Provider, MD  trimethoprim (TRIMPEX) 100 MG tablet Take 100 mg by mouth daily.    Yes Historical Provider, MD  vitamin B-12 (CYANOCOBALAMIN) 1000 MCG tablet Take 1,000 mcg by mouth daily.   Yes Historical Provider, MD   BP 117/60 mmHg  Pulse 110  Temp(Src) 102.8 F (39.3 C) (Rectal)  Resp 16  SpO2 91% Physical Exam  Constitutional: She is oriented to person, place, and time. She appears well-developed and well-nourished. No distress.  HENT:  Head:  Normocephalic.  Eyes: Conjunctivae are normal.  Neck: Neck supple. No tracheal deviation present.  Cardiovascular: Regular rhythm and normal heart sounds.  Tachycardia present.   Pulmonary/Chest: Effort normal. No respiratory distress. She has rales (RLL).  Abdominal: Soft. She exhibits no distension.  Neurological: She is alert and oriented to person, place, and time.  Skin: Skin is warm and dry.  Psychiatric: She has a normal mood and affect.    ED Course  Procedures (including critical care time) Labs Review Labs Reviewed  CBC WITH DIFFERENTIAL/PLATELET - Abnormal; Notable for the following:    WBC 19.6 (*)    Hemoglobin 11.6 (*)    HCT 35.7 (*)    RDW 18.5 (*)    Neutro Abs 17.6 (*)    Monocytes Absolute 1.1 (*)    All other components within normal  limits  BASIC METABOLIC PANEL - Abnormal; Notable for the following:    Glucose, Bld 287 (*)    BUN 30 (*)    GFR calc non Af Amer 52 (*)    GFR calc Af Amer 60 (*)    All other components within normal limits  CULTURE, BLOOD (ROUTINE X 2)  CULTURE, BLOOD (ROUTINE X 2)  URINALYSIS, ROUTINE W REFLEX MICROSCOPIC (NOT AT [facility])  I-STAT CG4 LACTIC ACID, ED    Imaging Review Dg Chest 2 View  02/24/2015  CLINICAL DATA:  Fever and weakness EXAM: CHEST  2 VIEW COMPARISON:  01/05/2015 FINDINGS: Cardiomegaly with chronic vascular congestion evident. Left subclavian 2 lead pacer noted. Very large hiatal hernia projects over the cardiac silhouette. Increased patchy left basilar airspace process compatible with mild left lower lobe pneumonia. Right lung remains relatively clear. No pneumothorax or edema. Atherosclerosis of the aorta. Trachea is midline. IMPRESSION: Patchy left lower lobe airspace process compatible with pneumonia. Other chronic findings as above. Electronically Signed   By: Jerilynn Mages.  Shick M.D.   On: 02/24/2015 21:35   I have personally reviewed and evaluated these images and lab results as part of my medical decision-making.    EKG Interpretation None      MDM   Final diagnoses:  CAP (community acquired pneumonia)  Sepsis, due to unspecified organism [facility])    81 year old female presents with failure of outpatient treatment on Omnicef for community-acquired pneumonia and bronchitis. Patient has COPD at baseline. Not on home oxygen but requiring 2 L here to maintain oxygenation. Had recurrent fever today. Last antibiotics 4 days ago.Hospitalist was consulted for admission and will see the patient in the emergency department. ABx choice deferred to hospitalist.    Leo Grosser, MD 02/25/15 1325

## 2015-02-24 NOTE — H&P (Signed)
[facility] History and Physical  Wendy Velasquez [id number] DOB: 1926-02-04 DOA: 02/24/2015  Referring physician: EDP PCP: [name], MD   Chief Complaint: PNA   HPI: Wendy Velasquez is a 81 y.o. female diagnosed with PNA on 12/5.  Put on course of omnicef.  Presents to the ED feeling no better, still having cough, generalized weakness, recurrence of fever.  CXR in ED tonight demonstrates LLL infiltrate.  Tm in ED is 102.8, tachy to 110s which improves to 90s with IVF.  WBC 19k.  Review of Systems: Systems reviewed.  As above, otherwise negative  Past Medical History  Diagnosis Date  . Hypertension   . COPD (chronic obstructive pulmonary disease) ([place])   . GERD (gastroesophageal reflux disease)   . Asthma   . Complete heart block ([place])   . Cardiac pacemaker in situ   . Type 2 diabetes mellitus ([place])   . Hyperlipidemia   . History of cardiac arrest     09-11-2012--  symptomatic complete heart block -- hr 20's--  cpr, intubated and temp. pacer until  perm. pacemaker placement  . Tachy-brady syndrome ([place])   . Diverticulosis of colon   . History of esophageal dilatation     for stricture 2007  . History of hiatal hernia   . Urgency incontinence   . History of iron deficiency anemia     hx transfusion's 15 yrs ago , approx 2001  . Peripheral neuropathy ([place])   . Wears glasses   . Glaucoma of both eyes    Past Surgical History  Procedure Laterality Date  . Interstim implant placement  2012  . Permanent pacemaker insertion N/A 09/11/2012    Procedure: PERMANENT PACEMAKER INSERTION;  Surgeon: Evans Lance, MD;  Location: [facility] CATH LAB;  Service: Cardiovascular;  Laterality: N/A;  St. Jude Dual Chamber  . Colonoscopy with esophagogastroduodenoscopy (egd)  last one 2007  . Laparotomy w/ unilateral salpingoophorectomy  1960's  approx  . Abdominal hysterectomy  1976  approx    w/ unilateral salpingoophorectomy  . Rectocele repair  1991  approx  . Cataract  extraction w/ intraocular lens  implant, bilateral  1990  . Interstim implant removal N/A 09/21/2014    Procedure: REMOVAL OF INTERSTIM IMPLANT;  Surgeon: Bjorn Loser, MD;  Location[facility];  Service: Urology;  Laterality: N/A;  . Interstim implant revision N/A 09/21/2014    Procedure: INTERSTIM STAGE ONE AND TWO;  Surgeon: Bjorn Loser, MD;  Location: [facility];  Service: Urology;  Laterality: N/A;   Social History:  reports that she has never smoked. She has never used smokeless tobacco. She reports that she does not drink alcohol or use illicit drugs.  No Known Allergies  Family History  Problem Relation Age of Onset  . Prostatitis Father   . Heart failure Sister   . Breast cancer Sister   . Diabetic kidney disease Sister   . Melanoma Brother      Prior to Admission medications   Medication Sig Start Date End Date Taking? Authorizing Provider  acetaminophen (TYLENOL) 500 MG tablet Take 1,000 mg by mouth every 6 (six) hours as needed for fever.   Yes Historical Provider, MD  albuterol (PROVENTIL) (2.5 MG/3ML) 0.083% nebulizer solution Take 2.5 mg by nebulization every 6 (six) hours as needed for wheezing or shortness of breath.   Yes Historical Provider, MD  albuterol-ipratropium (COMBIVENT) 18-103 MCG/ACT inhaler Inhale 2 puffs into the lungs every 6 (six) hours as needed  for wheezing.   Yes Historical Provider, MD  aspirin EC 81 MG tablet Take 81 mg by mouth daily.   Yes Historical Provider, MD  Brinzolamide-Brimonidine [facility]) 1-0.2 % SUSP Apply 1 drop to eye at bedtime.   Yes Historical Provider, MD  buPROPion (WELLBUTRIN) 100 MG tablet Take 100 mg by mouth daily.   Yes Historical Provider, MD  cetirizine (ZYRTEC) 10 MG tablet Take 10 mg by mouth daily.   Yes Historical Provider, MD  esomeprazole (NEXIUM) 40 MG capsule Take 40 mg by mouth daily before breakfast.   Yes Historical Provider, MD  fluticasone (FLONASE) 50 MCG/ACT nasal  spray SHAKE LQ AND U 2 SPRAYS IEN D 02/14/15  Yes Historical Provider, MD  Fluticasone-Salmeterol (ADVAIR DISKUS) 250-50 MCG/DOSE AEPB Inhale 1 puff into the lungs 2 (two) times daily.   Yes Historical Provider, MD  ibandronate (BONIVA) 150 MG tablet Take 150 mg by mouth every 30 (thirty) days. Take in the morning with a full glass of water, on an empty stomach, and do not take anything else by mouth or lie down for the next 30 min.   Yes Historical Provider, MD  insulin glargine (LANTUS) 100 UNIT/ML injection Inject 12 Units into the skin at bedtime. 9 pm daily   Yes Historical Provider, MD  metFORMIN (GLUCOPHAGE) 1000 MG tablet Take 1,000 mg by mouth 2 (two) times daily with a meal.   Yes Historical Provider, MD  montelukast (SINGULAIR) 10 MG tablet Take 10 mg by mouth at bedtime.   Yes Historical Provider, MD  sertraline (ZOLOFT) 50 MG tablet Take 50 mg by mouth 2 (two) times daily.   Yes Historical Provider, MD  simvastatin (ZOCOR) 10 MG tablet Take 10 mg by mouth at bedtime.   Yes Historical Provider, MD  trimethoprim (TRIMPEX) 100 MG tablet Take 100 mg by mouth daily.    Yes Historical Provider, MD  vitamin B-12 (CYANOCOBALAMIN) 1000 MCG tablet Take 1,000 mcg by mouth daily.   Yes Historical Provider, MD   Physical Exam: Filed Vitals:   02/24/15 2301 02/24/15 2323  BP: 111/50 131/52  Pulse: 95 92  Temp: 99.2 F (37.3 C) 98.5 F (36.9 C)  Resp: 21 22    BP 131/52 mmHg  Pulse 92  Temp(Src) 98.5 F (36.9 C) (Oral)  Resp 22  Ht 5\' 4"  (1.626 m)  Wt 56.1 kg (123 lb 10.9 oz)  BMI 21.22 kg/m2  SpO2 96%  General Appearance:    Alert, oriented, no distress, appears stated age  Head:    Normocephalic, atraumatic  Eyes:    PERRL, EOMI, sclera non-icteric        Nose:   Nares without drainage or epistaxis. Mucosa, turbinates normal  Throat:   Moist mucous membranes. Oropharynx without erythema or exudate.  Neck:   Supple. No carotid bruits.  No thyromegaly.  No lymphadenopathy.   Back:      No CVA tenderness, no spinal tenderness  Lungs:     Clear to auscultation bilaterally, without wheezes, rhonchi or rales  Chest wall:    No tenderness to palpitation  Heart:    Regular rate and rhythm without murmurs, gallops, rubs  Abdomen:     Soft, non-tender, nondistended, normal bowel sounds, no organomegaly  Genitalia:    deferred  Rectal:    deferred  Extremities:   No clubbing, cyanosis or edema.  Pulses:   2+ and symmetric all extremities  Skin:   Skin color, texture, turgor normal, no rashes or lesions  Lymph  nodes:   Cervical, supraclavicular, and axillary nodes normal  Neurologic:   CNII-XII intact. Normal strength, sensation and reflexes      throughout    Labs on Admission:  Basic Metabolic Panel:  Recent Labs Lab 02/24/15 2120  NA 138  K 4.4  CL 101  CO2 25  GLUCOSE 287*  BUN 30*  CREATININE 0.96  CALCIUM 9.2   Liver Function Tests: No results for input(s): AST, ALT, ALKPHOS, BILITOT, PROT, ALBUMIN in the last 168 hours. No results for input(s): LIPASE, AMYLASE in the last 168 hours. No results for input(s): AMMONIA in the last 168 hours. CBC:  Recent Labs Lab 02/24/15 2120  WBC 19.6*  NEUTROABS 17.6*  HGB 11.6*  HCT 35.7*  MCV 85.6  PLT 259   Cardiac Enzymes: No results for input(s): CKTOTAL, CKMB, CKMBINDEX, TROPONINI in the last 168 hours.  BNP (last 3 results) No results for input(s): PROBNP in the last 8760 hours. CBG: No results for input(s): GLUCAP in the last 168 hours.  Radiological Exams on Admission: Dg Chest 2 View  02/24/2015  CLINICAL DATA:  Fever and weakness EXAM: CHEST  2 VIEW COMPARISON:  01/05/2015 FINDINGS: Cardiomegaly with chronic vascular congestion evident. Left subclavian 2 lead pacer noted. Very large hiatal hernia projects over the cardiac silhouette. Increased patchy left basilar airspace process compatible with mild left lower lobe pneumonia. Right lung remains relatively clear. No pneumothorax or edema.  Atherosclerosis of the aorta. Trachea is midline. IMPRESSION: Patchy left lower lobe airspace process compatible with pneumonia. Other chronic findings as above. Electronically Signed   By: Jerilynn Mages.  Shick M.D.   On: 02/24/2015 21:35    EKG: Independently reviewed.  Assessment/Plan Principal Problem:   CAP (community acquired pneumonia) Active Problems:   HTN (hypertension) - was on BB/HCTZ combination + Valsartan.   DM2 (diabetes mellitus, type 2) ([place])   1. CAP - failed outpatient treatment 1. Will treat as HCAP given the failure of outpatient treatment 2. Cefepime and vanc 3. PNA pathway 2. HTN - continue home meds, hold lasix 3. DM2 - continue home metformin    Code Status: Full  Family Communication: Family at bedside Disposition Plan: Admit to inpatient   Time spent: 70 min  Bernadett Milian M. [facility] Pager [phone]  If 7AM-7PM, please contact the day team taking care of the patient [email or web address] Password [facility] 02/24/2015, 11:39 PM

## 2015-02-24 NOTE — Progress Notes (Signed)
ANTIBIOTIC CONSULT NOTE - INITIAL  Pharmacy Consult for Cefepime/Vancomycin Indication: HCAP  No Known Allergies  Patient Measurements: Height: 5\' 4"  (162.6 cm) Weight: 123 lb 10.9 oz (56.1 kg) IBW/kg (Calculated) : 54.7   Vital Signs: Temp: 98.5 F (36.9 C) (12/15 2323) Temp Source: Oral (12/15 2323) BP: 131/52 mmHg (12/15 2323) Pulse Rate: 92 (12/15 2323) Intake/Output from previous day:   Intake/Output from this shift:    Labs:  Recent Labs  02/24/15 2120  WBC 19.6*  HGB 11.6*  PLT 259  CREATININE 0.96   Estimated Creatinine Clearance: 35.7 mL/min (by C-G formula based on Cr of 0.96). No results for input(s): VANCOTROUGH, VANCOPEAK, VANCORANDOM, GENTTROUGH, GENTPEAK, GENTRANDOM, TOBRATROUGH, TOBRAPEAK, TOBRARND, AMIKACINPEAK, AMIKACINTROU, AMIKACIN in the last 72 hours.   Microbiology: No results found for this or any previous visit (from the past 720 hour(s)).  Medical History: Past Medical History  Diagnosis Date  . Hypertension   . COPD (chronic obstructive pulmonary disease) ([place])   . GERD (gastroesophageal reflux disease)   . Asthma   . Complete heart block ([place])   . Cardiac pacemaker in situ   . Type 2 diabetes mellitus ([place])   . Hyperlipidemia   . History of cardiac arrest     09-11-2012--  symptomatic complete heart block -- hr 20's--  cpr, intubated and temp. pacer until  perm. pacemaker placement  . Tachy-brady syndrome ([place])   . Diverticulosis of colon   . History of esophageal dilatation     for stricture 2007  . History of hiatal hernia   . Urgency incontinence   . History of iron deficiency anemia     hx transfusion's 15 yrs ago , approx 2001  . Peripheral neuropathy ([place])   . Wears glasses   . Glaucoma of both eyes     Medications:  Prescriptions prior to admission  Medication Sig Dispense Refill Last Dose  . acetaminophen (TYLENOL) 500 MG tablet Take 1,000 mg by mouth every 6 (six) hours as needed for fever.   02/24/2015 at  Unknown time  . albuterol (PROVENTIL) (2.5 MG/3ML) 0.083% nebulizer solution Take 2.5 mg by nebulization every 6 (six) hours as needed for wheezing or shortness of breath.   02/24/2015 at Unknown time  . albuterol-ipratropium (COMBIVENT) 18-103 MCG/ACT inhaler Inhale 2 puffs into the lungs every 6 (six) hours as needed for wheezing.   02/24/2015 at Unknown time  . aspirin EC 81 MG tablet Take 81 mg by mouth daily.   02/24/2015 at Unknown time  . Brinzolamide-Brimonidine (SIMBRINZA) 1-0.2 % SUSP Apply 1 drop to eye at bedtime.   02/23/2015 at Unknown time  . buPROPion (WELLBUTRIN) 100 MG tablet Take 100 mg by mouth daily.   02/24/2015 at Unknown time  . cetirizine (ZYRTEC) 10 MG tablet Take 10 mg by mouth daily.   02/24/2015 at Unknown time  . esomeprazole (NEXIUM) 40 MG capsule Take 40 mg by mouth daily before breakfast.   02/24/2015 at Unknown time  . fluticasone (FLONASE) 50 MCG/ACT nasal spray SHAKE LQ AND U 2 SPRAYS IEN D  0 02/24/2015 at Unknown time  . Fluticasone-Salmeterol (ADVAIR DISKUS) 250-50 MCG/DOSE AEPB Inhale 1 puff into the lungs 2 (two) times daily.   02/24/2015 at Unknown time  . ibandronate (BONIVA) 150 MG tablet Take 150 mg by mouth every 30 (thirty) days. Take in the morning with a full glass of water, on an empty stomach, and do not take anything else by mouth or lie down for the next 30  min.   Past Month at Unknown time  . insulin glargine (LANTUS) 100 UNIT/ML injection Inject 12 Units into the skin at bedtime. 9 pm daily   02/23/2015 at Unknown time  . metFORMIN (GLUCOPHAGE) 1000 MG tablet Take 1,000 mg by mouth 2 (two) times daily with a meal.   02/24/2015 at Unknown time  . montelukast (SINGULAIR) 10 MG tablet Take 10 mg by mouth at bedtime.   02/24/2015 at Unknown time  . sertraline (ZOLOFT) 50 MG tablet Take 50 mg by mouth 2 (two) times daily.   02/24/2015 at Unknown time  . simvastatin (ZOCOR) 10 MG tablet Take 10 mg by mouth at bedtime.   02/24/2015 at Unknown time  .  trimethoprim (TRIMPEX) 100 MG tablet Take 100 mg by mouth daily.    02/24/2015 at Unknown time  . vitamin B-12 (CYANOCOBALAMIN) 1000 MCG tablet Take 1,000 mcg by mouth daily.   02/24/2015 at Unknown time   Scheduled:  . ceFEPime (MAXIPIME) IV  1 g Intravenous Q12H  . heparin  5,000 Units Subcutaneous 3 times per day  . [START ON 02/25/2015] vancomycin  750 mg Intravenous QHS   Infusions:   Assessment: 58 yoF c/o weakness, fever being treated outpatient for PNA.  Cefepime and Vancomycin per Rx for HCAP.  Goal of Therapy:  Vancomycin trough level 15-20 mcg/ml  Plan:   Cefepime 1Gm IV q12h  Vancomycin 750mg  IV q24h  F/u Scr/cultures/levels as needed  Lawana Pai R 02/24/2015,11:33 PM

## 2015-02-24 NOTE — Progress Notes (Signed)
PHARMACIST - PHYSICIAN COMMUNICATION  CONCERNING: P&T Medication Policy Regarding Oral Bisphosphonates  RECOMMENDATION: Your order for alendronate (Fosamax), ibandronate (Boniva), or risedronate (Actonel) has been discontinued at this time.  If the patient's post-hospital medical condition warrants safe use of this class of drugs, please resume the pre-hospital regimen upon discharge.  DESCRIPTION:  Alendronate (Fosamax), ibandronate (Boniva), and risedronate (Actonel) can cause severe esophageal erosions in patients who are unable to remain upright at least 30 minutes after taking this medication.   Since brief interruptions in therapy are thought to have minimal impact on bone mineral density, the [facility] has established that bisphosphonate orders should be routinely discontinued during hospitalization.   To override this safety policy and permit administration of Boniva, Fosamax, or Actonel in the hospital, prescribers must write "DO NOT HOLD" in the comments section when placing the order for this class of medications.  Thanks Dorrene German 02/24/2015 11:53 PM

## 2015-02-25 DIAGNOSIS — E119 Type 2 diabetes mellitus without complications: Secondary | ICD-10-CM

## 2015-02-25 DIAGNOSIS — J189 Pneumonia, unspecified organism: Secondary | ICD-10-CM

## 2015-02-25 DIAGNOSIS — E1165 Type 2 diabetes mellitus with hyperglycemia: Secondary | ICD-10-CM

## 2015-02-25 DIAGNOSIS — K219 Gastro-esophageal reflux disease without esophagitis: Secondary | ICD-10-CM

## 2015-02-25 DIAGNOSIS — F329 Major depressive disorder, single episode, unspecified: Secondary | ICD-10-CM

## 2015-02-25 DIAGNOSIS — E46 Unspecified protein-calorie malnutrition: Secondary | ICD-10-CM

## 2015-02-25 DIAGNOSIS — E785 Hyperlipidemia, unspecified: Secondary | ICD-10-CM

## 2015-02-25 DIAGNOSIS — Z794 Long term (current) use of insulin: Secondary | ICD-10-CM

## 2015-02-25 LAB — GLUCOSE, CAPILLARY
Glucose-Capillary: 166 mg/dL — ABNORMAL HIGH (ref 65–99)
Glucose-Capillary: 144 mg/dL — ABNORMAL HIGH (ref 65–99)
Glucose-Capillary: 158 mg/dL — ABNORMAL HIGH (ref 65–99)
Glucose-Capillary: 133 mg/dL — ABNORMAL HIGH (ref 65–99)

## 2015-02-25 LAB — EXPECTORATED SPUTUM ASSESSMENT W REFEX TO RESP CULTURE

## 2015-02-25 LAB — HIV ANTIBODY (ROUTINE TESTING W REFLEX): HIV Screen 4th Generation wRfx: NONREACTIVE

## 2015-02-25 LAB — EXPECTORATED SPUTUM ASSESSMENT W GRAM STAIN, RFLX TO RESP C

## 2015-02-25 MED ORDER — INSULIN GLARGINE 100 UNIT/ML ~~LOC~~ SOLN
12.0000 [IU] | Freq: Every day | SUBCUTANEOUS | Status: DC
  Administered 2015-02-25 – 2015-03-01 (×5): 12 [IU] via SUBCUTANEOUS
  Filled 2015-02-25 (×5): qty 0.12

## 2015-02-25 MED ORDER — INSULIN ASPART 100 UNIT/ML ~~LOC~~ SOLN
0.0000 [IU] | Freq: Every day | SUBCUTANEOUS | Status: DC
  Administered 2015-02-27: 5 [IU] via SUBCUTANEOUS
  Administered 2015-02-28: 3 [IU] via SUBCUTANEOUS

## 2015-02-25 MED ORDER — GLUCERNA SHAKE PO LIQD
237.0000 mL | Freq: Two times a day (BID) | ORAL | Status: DC
  Administered 2015-02-26 – 2015-03-01 (×8): 237 mL via ORAL
  Filled 2015-02-25 (×8): qty 237

## 2015-02-25 MED ORDER — GUAIFENESIN ER 600 MG PO TB12
600.0000 mg | ORAL_TABLET | Freq: Two times a day (BID) | ORAL | Status: DC
  Administered 2015-02-25 – 2015-03-01 (×8): 600 mg via ORAL
  Filled 2015-02-25 (×10): qty 1

## 2015-02-25 MED ORDER — BUDESONIDE 0.25 MG/2ML IN SUSP
0.2500 mg | Freq: Two times a day (BID) | RESPIRATORY_TRACT | Status: DC
  Administered 2015-02-25 – 2015-03-01 (×9): 0.25 mg via RESPIRATORY_TRACT
  Filled 2015-02-25 (×9): qty 2

## 2015-02-25 MED ORDER — INSULIN ASPART 100 UNIT/ML ~~LOC~~ SOLN
0.0000 [IU] | Freq: Three times a day (TID) | SUBCUTANEOUS | Status: DC
Start: 2015-02-25 — End: 2015-03-01
  Administered 2015-02-25 (×2): 2 [IU] via SUBCUTANEOUS
  Administered 2015-02-25: 1 [IU] via SUBCUTANEOUS
  Administered 2015-02-26: 2 [IU] via SUBCUTANEOUS
  Administered 2015-02-26: 1 [IU] via SUBCUTANEOUS
  Administered 2015-02-26: 2 [IU] via SUBCUTANEOUS
  Administered 2015-02-27: 1 [IU] via SUBCUTANEOUS
  Administered 2015-02-27 (×2): 5 [IU] via SUBCUTANEOUS
  Administered 2015-02-28: 3 [IU] via SUBCUTANEOUS
  Administered 2015-02-28: 5 [IU] via SUBCUTANEOUS
  Administered 2015-02-28: 2 [IU] via SUBCUTANEOUS
  Administered 2015-03-01: 7 [IU] via SUBCUTANEOUS
  Administered 2015-03-01: 3 [IU] via SUBCUTANEOUS

## 2015-02-25 NOTE — Progress Notes (Signed)
Utilization review completed.  

## 2015-02-25 NOTE — Progress Notes (Signed)
Inpatient Diabetes Program Recommendations  AACE/ADA: New Consensus Statement on Inpatient Glycemic Control (2015)  Target Ranges:  Prepandial:   less than 140 mg/dL      Peak postprandial:   less than 180 mg/dL (1-2 hours)      Critically ill patients:  140 - 180 mg/dL   Review of Glycemic Control  Diabetes history: DM 2 Outpatient Diabetes medications: Lantus 12 units, Metformin 1,000 mg BID Current orders for Inpatient glycemic control: Lantus 12 units, Metformin 1,000 mg BID  Inpatient Diabetes Program Recommendations: Insulin - Basal: Patient has not recieved basal insulin. Glucose elevated well into the 200's. Please consider changing timing of basal insulin to daily so patient will recieve first dose this am instead of tonight.  Thanks,  Tama Headings RN, MSN, [facility] Inpatient Diabetes Coordinator Team Pager [phone] (8a-5p)

## 2015-02-25 NOTE — Progress Notes (Addendum)
[facility] PROGRESS NOTE  Wendy Velasquez [id number] DOB: [birth year] DOA: 02/24/2015 PCP: [name], MD  Assessment/Plan: 1-SIRS due to LLL PNA: with acute resp distress, but no hypoxia -will continue IV antibiotics -patient feeling somewhat better and with improvement in her WBC's count -will add pulmicort, mucinex and flutter valve -PRN oxygen supplementation -will continue nebulizer treatment  -follow clinical response  2-COPD: no active wheezing appreciated -will continue nebulizer therapy; with exchange of Advair for Pulmicort in acute resp distress -will follow clinical response -will continue singulair  3-Diabetes: on chronic insulin -will continue SSI and lantus -will check A1C -will hold oral agent while inpatient  4-GERD: will continue PPI  5-poor appetite and weight loss: at least moderate protein calorie malnutrition  -will start glucerna BID -will follow rec's from nutritional service  6-HLD: will continue statins   7-depression: will continue Wellbutrin   8-physical deconditioning: will ask PT to provide evaluation.  Code Status: Full Family Communication: daughter at bedside Disposition Plan: to be determine; will remained inpatient for now; continue IV antibiotics, follow PT recommendations.   Consultants:  None   Procedures:  See below for x-ray reports  Antibiotics:  Vancomycin 12/15  Cefepime 12/15  HPI/Subjective: With high grade temp overnight; no CP. Breathing somewhat better with ongoing productive cough.  Objective: Filed Vitals:   02/25/15 0453 02/25/15 1340  BP: 138/54 109/49  Pulse: 81 85  Temp: 97.9 F (36.6 C) 98.7 F (37.1 C)  Resp: 19 18    Intake/Output Summary (Last 24 hours) at 02/25/15 1634 Last data filed at 02/25/15 1232  Gross per 24 hour  Intake    480 ml  Output      0 ml  Net    480 ml   Filed Weights   02/24/15 2322  Weight: 56.1 kg (123 lb 10.9 oz)    Exam:   General:   Patient with high temp overnight, no CP and endorses breathing is somewhat better  Cardiovascular: S1 and s2, no rubs or gallops  Respiratory: no wheezing on exam; positive rhonchi and decrease BS at bases  Abdomen: soft, NT, ND, positive BS  Musculoskeletal: no Le edema appreciated   Data Reviewed: Basic Metabolic Panel:  Recent Labs Lab 02/24/15 2120  NA 138  K 4.4  CL 101  CO2 25  GLUCOSE 287*  BUN 30*  CREATININE 0.96  CALCIUM 9.2   CBC:  Recent Labs Lab 02/24/15 2120  WBC 19.6*  NEUTROABS 17.6*  HGB 11.6*  HCT 35.7*  MCV 85.6  PLT 259   CBG:  Recent Labs Lab 02/24/15 2337 02/25/15 0739 02/25/15 1135  GLUCAP 244* 166* 144*    Recent Results (from the past 240 hour(s))  Blood culture (routine x 2)     Status: None (Preliminary result)   Collection Time: 02/24/15  9:15 PM  Result Value Ref Range Status   Specimen Description BLOOD LEFT FA  Final   Special Requests BOTTLES DRAWN AEROBIC AND ANAEROBIC 10CC  Final   Culture   Final    NO GROWTH < 12 HOURS Performed at [facility]    Report Status PENDING  Incomplete  Blood culture (routine x 2)     Status: None (Preliminary result)   Collection Time: 02/24/15 10:26 PM  Result Value Ref Range Status   Specimen Description BLOOD RIGHT ANTECUBITAL  Final   Special Requests BOTTLES DRAWN AEROBIC AND ANAEROBIC 5ML  Final   Culture   Final    NO GROWTH <  12 HOURS Performed at [facility]    Report Status PENDING  Incomplete     Studies: Dg Chest 2 View  02/24/2015  CLINICAL DATA:  Fever and weakness EXAM: CHEST  2 VIEW COMPARISON:  01/05/2015 FINDINGS: Cardiomegaly with chronic vascular congestion evident. Left subclavian 2 lead pacer noted. Very large hiatal hernia projects over the cardiac silhouette. Increased patchy left basilar airspace process compatible with mild left lower lobe pneumonia. Right lung remains relatively clear. No pneumothorax or edema. Atherosclerosis of the  aorta. Trachea is midline. IMPRESSION: Patchy left lower lobe airspace process compatible with pneumonia. Other chronic findings as above. Electronically Signed   By: Jerilynn Mages.  Shick M.D.   On: 02/24/2015 21:35    Scheduled Meds: . aspirin EC  81 mg Oral Daily  . budesonide (PULMICORT) nebulizer solution  0.25 mg Nebulization BID  . buPROPion  100 mg Oral Daily  . ceFEPime (MAXIPIME) IV  1 g Intravenous Q12H  . [START ON 02/26/2015] feeding supplement (GLUCERNA SHAKE)  237 mL Oral BID BM  . heparin  5,000 Units Subcutaneous 3 times per day  . insulin aspart  0-5 Units Subcutaneous QHS  . insulin aspart  0-9 Units Subcutaneous TID WC  . insulin glargine  12 Units Subcutaneous Daily  . loratadine  10 mg Oral Daily  . montelukast  10 mg Oral QHS  . pantoprazole  80 mg Oral Q1200  . sertraline  50 mg Oral BID  . simvastatin  10 mg Oral QHS  . vancomycin  750 mg Intravenous QHS  . vitamin B-12  1,000 mcg Oral Daily   Continuous Infusions:   Principal Problem:   CAP (community acquired pneumonia) Active Problems:   HTN (hypertension) - was on BB/HCTZ combination + Valsartan.   DM2 (diabetes mellitus, type 2) ([place])   Time spent: 32 minutes    Barton Dubois  [facility] Pager [phone]. If 7PM-7AM, please contact night-coverage at [email or web address], password [facility] 02/25/2015, 4:34 PM  LOS: 1 day

## 2015-02-26 DIAGNOSIS — J441 Chronic obstructive pulmonary disease with (acute) exacerbation: Secondary | ICD-10-CM

## 2015-02-26 DIAGNOSIS — I10 Essential (primary) hypertension: Secondary | ICD-10-CM

## 2015-02-26 DIAGNOSIS — R5381 Other malaise: Secondary | ICD-10-CM

## 2015-02-26 LAB — GLUCOSE, CAPILLARY
GLUCOSE-CAPILLARY: 143 mg/dL — AB (ref 65–99)
GLUCOSE-CAPILLARY: 191 mg/dL — AB (ref 65–99)
GLUCOSE-CAPILLARY: 184 mg/dL — AB (ref 65–99)
Glucose-Capillary: 160 mg/dL — ABNORMAL HIGH (ref 65–99)

## 2015-02-26 LAB — HEMOGLOBIN A1C
HEMOGLOBIN A1C: 7.4 % — AB (ref 4.8–5.6)
MEAN PLASMA GLUCOSE: 166 mg/dL

## 2015-02-26 MED ORDER — PREDNISONE 20 MG PO TABS
40.0000 mg | ORAL_TABLET | Freq: Every day | ORAL | Status: DC
  Administered 2015-02-27 – 2015-02-28 (×2): 40 mg via ORAL
  Filled 2015-02-26 (×2): qty 2

## 2015-02-26 NOTE — Clinical Documentation Improvement (Signed)
Internal Medicine  Based on the clinical findings below, please document in next Note if you agree or disagree with ED documentation of diagnosis "Sepsis" which was POA.  Supporting Information: -- Source of infection: Pneumonia -- WBC: 19.6 -- T: 102.8 -- HR: 110, 104 -- RR: 24 with respiratory distress O2 Sat 91% RA requiring O2 2L [place]  Please exercise your independent, professional judgment when responding. A specific answer is not anticipated or expected.  Thank You, Ezekiel Ina RN [place] [phone]

## 2015-02-26 NOTE — Progress Notes (Signed)
[facility] PROGRESS NOTE  Wendy Velasquez [id number] DOB: 05-31-1925 DOA: 02/24/2015 PCP: [name], MD  Assessment/Plan: 1-SIRS due to LLL PNA: with acute resp distress, but no hypoxia -will continue IV antibiotics; patient is responding properly  -patient feeling somewhat better and with improvement in her WBC's count -will continue pulmicort, mucinex and flutter valve -PRN oxygen supplementation -will continue nebulizer treatment  -follow clinical response  2-COPD: patient with active increase wheezing appreciated on exam -will continue nebulizer therapy; with exchange of Pulmicort instead of advair during acute resp distress -will start prednisone and continue albuterol -follow clinical response -will continue singulair  3-Diabetes: on chronic insulin -will continue SSI and lantus - A1C 7.4 -will hold oral agent while inpatient  4-GERD: will continue PPI  5-poor appetite and weight loss: at least moderate protein calorie malnutrition  -will continue glucerna BID -will follow rec's from nutritional service  6-HLD: will continue statins   7-depression: will continue Wellbutrin   8-physical deconditioning: will follow PT rec's after assessment completed. Patient live in an independent living community  Code Status: Full Family Communication: daughter at bedside Disposition Plan: to be determine; will remained inpatient for now; continue IV antibiotics, follow PT recommendations.   Consultants:  None   Procedures:  See below for x-ray reports  Antibiotics:  Vancomycin 12/15  Cefepime 12/15  HPI/Subjective: Currently afebrile, feeling better overall. No CP. Still with poor to fair air movement and increase wheezing.  Objective: Filed Vitals:   02/26/15 0003 02/26/15 0500  BP: 148/53 134/58  Pulse: 94 81  Temp: 99 F (37.2 C) 98.7 F (37.1 C)  Resp: 18 18    Intake/Output Summary (Last 24 hours) at 02/26/15 1221 Last data filed  at 02/26/15 0929  Gross per 24 hour  Intake    680 ml  Output      0 ml  Net    680 ml   Filed Weights   02/24/15 2322  Weight: 56.1 kg (123 lb 10.9 oz)    Exam:   General:  Patient is afebrile, reporting breathing easier and denies CP. Patient endorses poor appetite. Increase wheezing on exam  Cardiovascular: S1 and s2, no rubs or gallops  Respiratory: increase exp wheezing on exam; positive diffuse rhonchi and fair air movement   Abdomen: soft, NT, ND, positive BS  Musculoskeletal: no Le edema appreciated   Data Reviewed: Basic Metabolic Panel:  Recent Labs Lab 02/24/15 2120  NA 138  K 4.4  CL 101  CO2 25  GLUCOSE 287*  BUN 30*  CREATININE 0.96  CALCIUM 9.2   CBC:  Recent Labs Lab 02/24/15 2120  WBC 19.6*  NEUTROABS 17.6*  HGB 11.6*  HCT 35.7*  MCV 85.6  PLT 259   CBG:  Recent Labs Lab 02/25/15 1135 02/25/15 1638 02/25/15 2118 02/26/15 0751 02/26/15 1136  GLUCAP 144* 158* 133* 143* 191*    Recent Results (from the past 240 hour(s))  Blood culture (routine x 2)     Status: None (Preliminary result)   Collection Time: 02/24/15  9:15 PM  Result Value Ref Range Status   Specimen Description BLOOD LEFT FA  Final   Special Requests BOTTLES DRAWN AEROBIC AND ANAEROBIC 10CC  Final   Culture   Final    NO GROWTH 2 DAYS Performed at [facility]    Report Status PENDING  Incomplete  Blood culture (routine x 2)     Status: None (Preliminary result)   Collection Time: 02/24/15 10:26 PM  Result Value Ref Range Status   Specimen Description BLOOD RIGHT ANTECUBITAL  Final   Special Requests BOTTLES DRAWN AEROBIC AND ANAEROBIC 5ML  Final   Culture   Final    NO GROWTH 2 DAYS Performed at [facility]    Report Status PENDING  Incomplete  Culture, sputum-assessment     Status: None   Collection Time: 02/25/15  5:35 PM  Result Value Ref Range Status   Specimen Description SPUTUM  Final   Special Requests NONE  Final   Sputum  evaluation   Final    THIS SPECIMEN IS ACCEPTABLE. RESPIRATORY CULTURE REPORT TO FOLLOW.   Report Status 02/25/2015 FINAL  Final     Studies: Dg Chest 2 View  02/24/2015  CLINICAL DATA:  Fever and weakness EXAM: CHEST  2 VIEW COMPARISON:  01/05/2015 FINDINGS: Cardiomegaly with chronic vascular congestion evident. Left subclavian 2 lead pacer noted. Very large hiatal hernia projects over the cardiac silhouette. Increased patchy left basilar airspace process compatible with mild left lower lobe pneumonia. Right lung remains relatively clear. No pneumothorax or edema. Atherosclerosis of the aorta. Trachea is midline. IMPRESSION: Patchy left lower lobe airspace process compatible with pneumonia. Other chronic findings as above. Electronically Signed   By: Jerilynn Mages.  Shick M.D.   On: 02/24/2015 21:35    Scheduled Meds: . aspirin EC  81 mg Oral Daily  . budesonide (PULMICORT) nebulizer solution  0.25 mg Nebulization BID  . buPROPion  100 mg Oral Daily  . ceFEPime (MAXIPIME) IV  1 g Intravenous Q12H  . feeding supplement (GLUCERNA SHAKE)  237 mL Oral BID BM  . guaiFENesin  600 mg Oral BID  . heparin  5,000 Units Subcutaneous 3 times per day  . insulin aspart  0-5 Units Subcutaneous QHS  . insulin aspart  0-9 Units Subcutaneous TID WC  . insulin glargine  12 Units Subcutaneous Daily  . loratadine  10 mg Oral Daily  . montelukast  10 mg Oral QHS  . pantoprazole  80 mg Oral Q1200  . [START ON 02/27/2015] predniSONE  40 mg Oral Q breakfast  . sertraline  50 mg Oral BID  . simvastatin  10 mg Oral QHS  . vancomycin  750 mg Intravenous QHS  . vitamin B-12  1,000 mcg Oral Daily   Time spent: 30 minutes    Barton Dubois  [facility] Pager [phone]. If 7PM-7AM, please contact night-coverage at [email or web address], password [facility] 02/26/2015, 12:21 PM  LOS: 2 days

## 2015-02-26 NOTE — Progress Notes (Signed)
Tele noted pacemaker failure to sense with PVC's. Vitals stable, EKG uploaded, MD notified. Will continue to monitor.

## 2015-02-26 NOTE — Evaluation (Signed)
Physical Therapy Evaluation Patient Details Name: Wendy Velasquez MRN: [id number] DOB: [birth year] Today's Date: 02/26/2015   History of Present Illness  81 yo female adm with SIRS due to LLL PNA: with acute resp distress  Clinical Impression  Pt admitted with above diagnosis. Pt currently with functional limitations due to the deficits listed below (see PT Problem List). Pt will benefit from skilled PT to increase their independence and safety with mobility to allow discharge to the venue listed below.   Pt very motivated to work with PT, fatigues much more rapidly than her baseline;  O2 sats 94% on RA during amb, DOE 2-3/4, pt able to converse during gait;HR 99 Pt will benefit from HHPT at D/C      Follow Up Recommendations Home health PT    Equipment Recommendations  None recommended by PT    Recommendations for Other Services       Precautions / Restrictions Precautions Precautions: Fall      Mobility  Bed Mobility Overal bed mobility: Needs Assistance Bed Mobility: Supine to Sit     Supine to sit: Supervision     General bed mobility comments: incr time  Transfers Overall transfer level: Needs assistance Equipment used: Rolling walker (2 wheeled) Transfers: Sit to/from Stand Sit to Stand: Min guard         General transfer comment: min/guard upon rise  to steady during transition to RW  Ambulation/Gait Ambulation/Gait assistance: Min guard Ambulation Distance (Feet): 160 Feet Assistive device: Rolling walker (2 wheeled) Gait Pattern/deviations: Step-through pattern;Decreased stride length;Trunk flexed     General Gait Details: cues for safety with turns, occasional drifting R/L but pt is accustomed to rollator  Stairs            Wheelchair Mobility    Modified Rankin (Stroke Patients Only)       Balance Overall balance assessment: Needs assistance           Standing balance-Leahy Scale: Fair Standing balance comment: pt able to  maintain balance without support, able to take 3  side steps to chair without UE support and close guarding for safety                              Pertinent Vitals/Pain Pain Assessment: No/denies pain    Home Living Family/patient expects to be discharged to:: Private residence Living Arrangements: Spouse/significant other Available Help at Discharge: Family Type of Home: House Home Access: Level entry     Home Layout: One level Home Equipment: Environmental consultant - 4 wheels Additional Comments: uses rollator for longer distances, does not use AD in her house    Prior Function Level of Independence: Independent with assistive device(s);Independent               Hand Dominance        Extremity/Trunk Assessment   Upper Extremity Assessment: Generalized weakness           Lower Extremity Assessment: Generalized weakness         Communication   Communication: No difficulties  Cognition Arousal/Alertness: Awake/alert Behavior During Therapy: WFL for tasks assessed/performed Overall Cognitive Status: Within Functional Limits for tasks assessed                      General Comments      Exercises        Assessment/Plan    PT Assessment Patient needs continued PT services  PT  Diagnosis Difficulty walking   PT Problem List Decreased strength;Decreased range of motion;Decreased activity tolerance;Decreased balance;Decreased mobility;Decreased knowledge of use of DME  PT Treatment Interventions DME instruction;Gait training;Functional mobility training;Therapeutic activities;Therapeutic exercise;Patient/family education   PT Goals (Current goals can be found in the Care Plan section) Acute Rehab PT Goals Patient Stated Goal: to get better, stronger PT Goal Formulation: With patient/family Time For Goal Achievement: 03/05/15 Potential to Achieve Goals: Good    Frequency Min 3X/week   Barriers to discharge        Co-evaluation                End of Session Equipment Utilized During Treatment: Gait belt Activity Tolerance: Patient tolerated treatment well;Patient limited by fatigue Patient left: in chair;with call bell/phone within reach;with family/visitor present Nurse Communication: Mobility status         Time: [id number] PT Time Calculation (min) (ACUTE ONLY): 18 min   Charges:   PT Evaluation $Initial PT Evaluation Tier I: 1 Procedure     PT G CodesKenyon Ana March 04, 2015, 4:42 PM

## 2015-02-27 DIAGNOSIS — A419 Sepsis, unspecified organism: Principal | ICD-10-CM

## 2015-02-27 DIAGNOSIS — E43 Unspecified severe protein-calorie malnutrition: Secondary | ICD-10-CM

## 2015-02-27 LAB — CBC
HCT: 36 % (ref 36.0–46.0)
HEMOGLOBIN: 11.4 g/dL — AB (ref 12.0–15.0)
MCH: 27.9 pg (ref 26.0–34.0)
MCHC: 31.7 g/dL (ref 30.0–36.0)
MCV: 88.2 fL (ref 78.0–100.0)
Platelets: 283 10*3/uL (ref 150–400)
RBC: 4.08 MIL/uL (ref 3.87–5.11)
RDW: 18.3 % — ABNORMAL HIGH (ref 11.5–15.5)
WBC: 9.8 10*3/uL (ref 4.0–10.5)

## 2015-02-27 LAB — GLUCOSE, CAPILLARY
GLUCOSE-CAPILLARY: 140 mg/dL — AB (ref 65–99)
GLUCOSE-CAPILLARY: 278 mg/dL — AB (ref 65–99)
GLUCOSE-CAPILLARY: 280 mg/dL — AB (ref 65–99)
Glucose-Capillary: 352 mg/dL — ABNORMAL HIGH (ref 65–99)

## 2015-02-27 LAB — BASIC METABOLIC PANEL
Anion gap: 9 (ref 5–15)
BUN: 17 mg/dL (ref 6–20)
CHLORIDE: 106 mmol/L (ref 101–111)
CO2: 28 mmol/L (ref 22–32)
CREATININE: 0.63 mg/dL (ref 0.44–1.00)
Calcium: 8.6 mg/dL — ABNORMAL LOW (ref 8.9–10.3)
GFR calc Af Amer: >60 mL/min (ref >60)
GFR calc non Af Amer: >60 mL/min (ref >60)
GLUCOSE: 137 mg/dL — AB (ref 65–99)
POTASSIUM: 3.8 mmol/L (ref 3.5–5.1)
Sodium: 143 mmol/L (ref 135–145)

## 2015-02-27 NOTE — Progress Notes (Signed)
[facility] PROGRESS NOTE  Wendy Velasquez [id number] DOB: 09/28/1925 DOA: 02/24/2015 PCP: [name], MD  Assessment/Plan: 1-Sepsis due to LLL PNA: with acute resp distress, but no hypoxia -will continue IV antibiotics; patient is responding properly  -patient feeling much better and with improvement in her respiratory rate; also with no further fever and normalization in her WBC's count -will continue pulmicort, mucinex and flutter valve -PRN oxygen supplementation -will continue prednisone -Hopefully able to transition antibiotics to by mouth and patient discharged in the next 24-48 hours.  2-COPD: patient breathing and air movement improving; less wheezing -will continue nebulizer therapy; with exchange of Pulmicort instead of advair during acute resp distress -Continue prednisone and continue albuterol -follow clinical response -will continue singulair  3-Diabetes: on chronic insulin -will continue SSI and lantus - A1C 7.4 -will hold oral agent while inpatient  4-GERD: will continue PPI  5-poor appetite and weight loss: Severe  -will continue glucerna TID -Appreciate recommendations from nutritional service, will follow rec's  6-HLD: will continue statins   7-depression: will continue Wellbutrin   8-physical deconditioning: will follow PT rec's after assessment completed. Patient live in an independent living community.  Code Status: Full Family Communication: daughter at bedside Disposition Plan: to be determine; will remained inpatient for now; continue IV antibiotics, follow PT recommendations.   Consultants:  None   Procedures:  See below for x-ray reports  Antibiotics:  Vancomycin 12/15  Cefepime 12/15  HPI/Subjective: Currently afebrile, feeling much better overall. Denies chest pain and her breathing continue improving.  Objective: Filed Vitals:   02/27/15 0523 02/27/15 1400  BP: 154/72 137/62  Pulse: 78 79  Temp: 98.3 F  (36.8 C) 97.8 F (36.6 C)  Resp: 18 20    Intake/Output Summary (Last 24 hours) at 02/27/15 1908 Last data filed at 02/27/15 0900  Gross per 24 hour  Intake    240 ml  Output      0 ml  Net    240 ml   Filed Weights   02/24/15 2322  Weight: 56.1 kg (123 lb 10.9 oz)    Exam:   General:  Patient is afebrile, reporting breathing is much better. Able to almost speak in full sentences and with a significant decrease in expiratory wheezing. Denies CP.   Cardiovascular: S1 and s2, no rubs or gallops  Respiratory: Improved air movement, less wheezing on exam; still with rhonchi   Abdomen: soft, NT, ND, positive BS  Musculoskeletal: no Le edema appreciated   Data Reviewed: Basic Metabolic Panel:  Recent Labs Lab 02/24/15 2120 02/27/15 0528  NA 138 143  K 4.4 3.8  CL 101 106  CO2 25 28  GLUCOSE 287* 137*  BUN 30* 17  CREATININE 0.96 0.63  CALCIUM 9.2 8.6*   CBC:  Recent Labs Lab 02/24/15 2120 02/27/15 0528  WBC 19.6* 9.8  NEUTROABS 17.6*  --   HGB 11.6* 11.4*  HCT 35.7* 36.0  MCV 85.6 88.2  PLT 259 283   CBG:  Recent Labs Lab 02/26/15 1705 02/26/15 2124 02/27/15 0811 02/27/15 1209 02/27/15 1627  GLUCAP 184* 160* 140* 278* 280*    Recent Results (from the past 240 hour(s))  Blood culture (routine x 2)     Status: None (Preliminary result)   Collection Time: 02/24/15  9:15 PM  Result Value Ref Range Status   Specimen Description BLOOD LEFT FA  Final   Special Requests BOTTLES DRAWN AEROBIC AND ANAEROBIC 10CC  Final   Culture   Final  NO GROWTH 3 DAYS Performed at [facility]    Report Status PENDING  Incomplete  Blood culture (routine x 2)     Status: None (Preliminary result)   Collection Time: 02/24/15 10:26 PM  Result Value Ref Range Status   Specimen Description BLOOD RIGHT ANTECUBITAL  Final   Special Requests BOTTLES DRAWN AEROBIC AND ANAEROBIC 5ML  Final   Culture   Final    NO GROWTH 3 DAYS Performed at [facility]    Report Status PENDING  Incomplete  Culture, sputum-assessment     Status: None   Collection Time: 02/25/15  5:35 PM  Result Value Ref Range Status   Specimen Description SPUTUM  Final   Special Requests NONE  Final   Sputum evaluation   Final    THIS SPECIMEN IS ACCEPTABLE. RESPIRATORY CULTURE REPORT TO FOLLOW.   Report Status 02/25/2015 FINAL  Final  Culture, respiratory (NON-Expectorated)     Status: None (Preliminary result)   Collection Time: 02/25/15  5:35 PM  Result Value Ref Range Status   Specimen Description SPUTUM  Final   Special Requests NONE  Final   Gram Stain   Final    ABUNDANT WBC FEW SQUAMOUS EPITHELIAL CELLS PRESENT FEW GRAM POSITIVE COCCI IN PAIRS THIS SPECIMEN IS ACCEPTABLE FOR SPUTUM CULTURE Performed at [facility]    Culture   Final    Culture reincubated for better growth Performed at [facility]    Report Status PENDING  Incomplete     Studies: No results found.  Scheduled Meds: . aspirin EC  81 mg Oral Daily  . budesonide (PULMICORT) nebulizer solution  0.25 mg Nebulization BID  . buPROPion  100 mg Oral Daily  . ceFEPime (MAXIPIME) IV  1 g Intravenous Q12H  . feeding supplement (GLUCERNA SHAKE)  237 mL Oral BID BM  . guaiFENesin  600 mg Oral BID  . heparin  5,000 Units Subcutaneous 3 times per day  . insulin aspart  0-5 Units Subcutaneous QHS  . insulin aspart  0-9 Units Subcutaneous TID WC  . insulin glargine  12 Units Subcutaneous Daily  . loratadine  10 mg Oral Daily  . montelukast  10 mg Oral QHS  . pantoprazole  80 mg Oral Q1200  . predniSONE  40 mg Oral Q breakfast  . sertraline  50 mg Oral BID  . simvastatin  10 mg Oral QHS  . vancomycin  750 mg Intravenous QHS  . vitamin B-12  1,000 mcg Oral Daily   Time spent: 30 minutes    Barton Dubois  [facility] Pager [phone]. If 7PM-7AM, please contact night-coverage at [email or web address], password [facility] 02/27/2015, 7:08 PM  LOS: 3 days

## 2015-02-27 NOTE — Progress Notes (Signed)
Initial Nutrition Assessment  DOCUMENTATION CODES:   Severe malnutrition in context of acute illness/injury  INTERVENTION:   -Continue Glucerna Shake po TID, each supplement provides 220 kcal and 10 grams of protein -Provide Magic cup once daily, each supplement provides 290 kcal and 9 grams of protein -Provide daily snacks -RD to continue to monitor  NUTRITION DIAGNOSIS:   Malnutrition related to acute illness, poor appetite as evidenced by percent weight loss, energy intake < or equal to 50% for > or equal to 5 days, severe depletion of muscle mass, moderate depletion of body fat.  GOAL:   Patient will meet greater than or equal to 90% of their needs  MONITOR:   PO intake, Supplement acceptance, Labs, Weight trends, Skin, I & O's  REASON FOR ASSESSMENT:   Consult Assessment of nutrition requirement/status  ASSESSMENT:   81 y.o. female diagnosed with PNA on 12/5. Put on course of omnicef. Presents to the ED feeling no better, still having cough, generalized weakness, recurrence of fever.  Pt in room with daughter at bedside. Pt reports poor appetite for around 6 weeks PTA. Pt currently eating 50% of meals. Pt has been ordered Glucerna shakes and she is drinking them with no issues. Pt would like snacks provided, RD to order. Per daughter, UBW is 140 lb. Pt has lost 9 lb since December 5th (7% weight loss x 2 weeks, significant for time frame).  Nutrition-Focused physical exam completed. Findings are moderate fat depletion, severe muscle depletion, and no edema.   Labs reviewed: CBGs: 140-184  Diet Order:  Diet Carb Modified Fluid consistency:: Thin; Room service appropriate?: Yes  Skin:  Reviewed, no issues  Last BM:  12/17  Height:   Ht Readings from Last 1 Encounters:  02/24/15 5\' 4"  (1.626 m)    Weight:   Wt Readings from Last 1 Encounters:  02/24/15 123 lb 10.9 oz (56.1 kg)    Ideal Body Weight:  54.5 kg  BMI:  Body mass index is 21.22  kg/(m^2).  Estimated Nutritional Needs:   Kcal:  1500-1700  Protein:  75-85g  Fluid:  1.6L/day  EDUCATION NEEDS:   No education needs identified at this time  Clayton Bibles, MS, RD, LDN Pager: [phone] After Hours Pager: [phone]

## 2015-02-28 DIAGNOSIS — R0600 Dyspnea, unspecified: Secondary | ICD-10-CM

## 2015-02-28 LAB — GLUCOSE, CAPILLARY
GLUCOSE-CAPILLARY: 165 mg/dL — AB (ref 65–99)
GLUCOSE-CAPILLARY: 228 mg/dL — AB (ref 65–99)
GLUCOSE-CAPILLARY: 257 mg/dL — AB (ref 65–99)
GLUCOSE-CAPILLARY: 258 mg/dL — AB (ref 65–99)

## 2015-02-28 LAB — CULTURE, RESPIRATORY: CULTURE: NORMAL

## 2015-02-28 LAB — CULTURE, RESPIRATORY W GRAM STAIN

## 2015-02-28 MED ORDER — IPRATROPIUM-ALBUTEROL 0.5-2.5 (3) MG/3ML IN SOLN
3.0000 mL | Freq: Four times a day (QID) | RESPIRATORY_TRACT | Status: DC
  Administered 2015-02-28 – 2015-03-01 (×3): 3 mL via RESPIRATORY_TRACT
  Filled 2015-02-28 (×4): qty 3

## 2015-02-28 MED ORDER — PREDNISONE 20 MG PO TABS
40.0000 mg | ORAL_TABLET | Freq: Two times a day (BID) | ORAL | Status: DC
  Administered 2015-02-28 – 2015-03-01 (×2): 40 mg via ORAL
  Filled 2015-02-28 (×3): qty 2

## 2015-02-28 MED ORDER — AMOXICILLIN-POT CLAVULANATE 875-125 MG PO TABS
1.0000 | ORAL_TABLET | Freq: Two times a day (BID) | ORAL | Status: DC
  Administered 2015-02-28 – 2015-03-01 (×3): 1 via ORAL
  Filled 2015-02-28 (×4): qty 1

## 2015-02-28 MED ORDER — IPRATROPIUM-ALBUTEROL 0.5-2.5 (3) MG/3ML IN SOLN
3.0000 mL | Freq: Four times a day (QID) | RESPIRATORY_TRACT | Status: DC
  Administered 2015-02-28: 3 mL via RESPIRATORY_TRACT
  Filled 2015-02-28: qty 3

## 2015-02-28 NOTE — Progress Notes (Signed)
Pharmacy Antibiotic Follow-up Note  Wendy Velasquez is a 81 y.o. year-old female admitted on 02/24/2015.  The patient is currently on day D4 of Vancomycin/Cefepime for pneumonia.  Assessment/Plan: After discussion with Dr. Dyann Kief, the current antibiotic(s) will be continued for now, then narrowed prior to discharge later today.  Temp (24hrs), Avg:97.8 F (36.6 C), Min:97.7 F (36.5 C), Max:97.9 F (36.6 C)   Recent Labs Lab 02/24/15 2120 02/27/15 0528  WBC 19.6* 9.8    Recent Labs Lab 02/24/15 2120 02/27/15 0528  CREATININE 0.96 0.63   Estimated Creatinine Clearance: 42.8 mL/min (by C-G formula based on Cr of 0.63).    No Known Allergies  Antimicrobials this admission: 12/15 >>Cefepime >> 12/16 >>Vancomycin >>   Levels/dose changes this admission: None  Microbiology results: 12/15 blood: NGTD 12/16 sputum: reincubated  Thank you for allowing pharmacy to be a part of this patient's care.  Gretta Arab PharmD, BCPS Pager [phone] 02/28/2015 7:36 AM

## 2015-02-28 NOTE — Progress Notes (Signed)
[facility] PROGRESS NOTE  Wendy Velasquez [id number] DOB: Sep 18, 1925 DOA: 02/24/2015 PCP: [name], MD  Assessment/Plan: 1-Sepsis due to LLL PNA: with acute resp distress, but no hypoxia -will continue IV antibiotics; patient is responding properly  -patient feeling not so good today and with worsening in her breathing. Also with increase wheezing  -sepsis features resolved -will continue pulmicort, mucinex and flutter valve -PRN oxygen supplementation -will continue prednisone; but dose adjusted as mentioned below -Hopefully able to transition antibiotics to by mouth and patient discharged in the next 24-48 hours.  2-COPD: patient breathing and air movement improving; less wheezing -will continue nebulizer therapy; with exchange of Pulmicort instead of advair during acute resp distress -Continue prednisone; but increase dose to BID. Will continue flutter valve -follow clinical response -will continue singulair  3-Diabetes: on chronic insulin -will continue SSI and lantus - A1C 7.4 -will continue holding oral agent while inpatient  4-GERD: will continue PPI  5-poor appetite and weight loss: Severe  -will continue glucerna TID -Appreciate recommendations from nutritional service, will follow rec's  6-HLD: will continue statins   7-depression: will continue Wellbutrin   8-physical deconditioning: will follow PT rec's after assessment completed. Patient live in an independent living community. -plan is to arrange [facility] services at discharge  Code Status: Full Family Communication: daughter at bedside Disposition Plan: to be determine; will remained inpatient for now; continue IV antibiotics, follow PT recommendations.   Consultants:  None   Procedures:  See below for x-ray reports  Antibiotics:  Vancomycin 12/15  Cefepime 12/15  HPI/Subjective: Currently afebrile and denies CP. Patient with worsening breathing today and with increase  wheezing.  Objective: Filed Vitals:   02/28/15 0455 02/28/15 1415  BP: 106/48 110/65  Pulse: 76 77  Temp: 97.9 F (36.6 C) 98.2 F (36.8 C)  Resp: 20 18    Intake/Output Summary (Last 24 hours) at 02/28/15 1454 Last data filed at 02/28/15 0046  Gross per 24 hour  Intake    250 ml  Output      0 ml  Net    250 ml   Filed Weights   02/24/15 2322  Weight: 56.1 kg (123 lb 10.9 oz)    Exam:   General:  Patient has remained; but is experiencing worsening breathing today; increase wheezing, unable to speak in full sentences and with dry cough. Denies CP.   Cardiovascular: S1 and s2, no rubs or gallops  Respiratory: diffuse exp wheezing; diffuse rhonchi, decrease air movement    Abdomen: soft, NT, ND, positive BS  Musculoskeletal: no Le edema appreciated   Data Reviewed: Basic Metabolic Panel:  Recent Labs Lab 02/24/15 2120 02/27/15 0528  NA 138 143  K 4.4 3.8  CL 101 106  CO2 25 28  GLUCOSE 287* 137*  BUN 30* 17  CREATININE 0.96 0.63  CALCIUM 9.2 8.6*   CBC:  Recent Labs Lab 02/24/15 2120 02/27/15 0528  WBC 19.6* 9.8  NEUTROABS 17.6*  --   HGB 11.6* 11.4*  HCT 35.7* 36.0  MCV 85.6 88.2  PLT 259 283   CBG:  Recent Labs Lab 02/27/15 1209 02/27/15 1627 02/27/15 2056 02/28/15 0804 02/28/15 1205  GLUCAP 278* 280* 352* 165* 228*    Recent Results (from the past 240 hour(s))  Blood culture (routine x 2)     Status: None (Preliminary result)   Collection Time: 02/24/15  9:15 PM  Result Value Ref Range Status   Specimen Description BLOOD LEFT FA  Final  Special Requests BOTTLES DRAWN AEROBIC AND ANAEROBIC 10CC  Final   Culture   Final    NO GROWTH 4 DAYS Performed at [facility]    Report Status PENDING  Incomplete  Blood culture (routine x 2)     Status: None (Preliminary result)   Collection Time: 02/24/15 10:26 PM  Result Value Ref Range Status   Specimen Description BLOOD RIGHT ANTECUBITAL  Final   Special Requests BOTTLES  DRAWN AEROBIC AND ANAEROBIC 5ML  Final   Culture   Final    NO GROWTH 4 DAYS Performed at [facility]    Report Status PENDING  Incomplete  Culture, sputum-assessment     Status: None   Collection Time: 02/25/15  5:35 PM  Result Value Ref Range Status   Specimen Description SPUTUM  Final   Special Requests NONE  Final   Sputum evaluation   Final    THIS SPECIMEN IS ACCEPTABLE. RESPIRATORY CULTURE REPORT TO FOLLOW.   Report Status 02/25/2015 FINAL  Final  Culture, respiratory (NON-Expectorated)     Status: None   Collection Time: 02/25/15  5:35 PM  Result Value Ref Range Status   Specimen Description SPUTUM  Final   Special Requests NONE  Final   Gram Stain   Final    ABUNDANT WBC FEW SQUAMOUS EPITHELIAL CELLS PRESENT FEW GRAM POSITIVE COCCI IN PAIRS THIS SPECIMEN IS ACCEPTABLE FOR SPUTUM CULTURE Performed at [facility]    Culture   Final    NORMAL OROPHARYNGEAL FLORA Performed at [facility]    Report Status 02/28/2015 FINAL  Final     Studies: No results found.  Scheduled Meds: . aspirin EC  81 mg Oral Daily  . budesonide (PULMICORT) nebulizer solution  0.25 mg Nebulization BID  . buPROPion  100 mg Oral Daily  . ceFEPime (MAXIPIME) IV  1 g Intravenous Q12H  . feeding supplement (GLUCERNA SHAKE)  237 mL Oral BID BM  . guaiFENesin  600 mg Oral BID  . heparin  5,000 Units Subcutaneous 3 times per day  . insulin aspart  0-5 Units Subcutaneous QHS  . insulin aspart  0-9 Units Subcutaneous TID WC  . insulin glargine  12 Units Subcutaneous Daily  . ipratropium-albuterol  3 mL Inhalation Q6H  . loratadine  10 mg Oral Daily  . montelukast  10 mg Oral QHS  . pantoprazole  80 mg Oral Q1200  . predniSONE  40 mg Oral BID WC  . sertraline  50 mg Oral BID  . simvastatin  10 mg Oral QHS  . vancomycin  750 mg Intravenous QHS  . vitamin B-12  1,000 mcg Oral Daily   Time spent: 30 minutes    Barton Dubois  [facility] Pager [phone]. If  7PM-7AM, please contact night-coverage at [email or web address], password [facility] 02/28/2015, 2:54 PM  LOS: 4 days

## 2015-02-28 NOTE — Care Management Note (Signed)
Case Management Note  Patient Details  Name: RAEGHAN PAULINO MRN: [id number] Date of Birth: 05-07-1925  Subjective/Objective:   81 y/o f admitted w/PNA. From home w/family, has rw. [facility] chosen for HHPT-await HHPT, f26f order. [place] rep Santiago Glad following.Provided dtr w/private duty care listing(independant decision,out of pocket expense)dtr voiced understanding.                 Action/Plan:d/c plan home w/HHPT.   Expected Discharge Date:                  Expected Discharge Plan:  [place]  In-House Referral:     Discharge planning Services  CM Consult  Post Acute Care Choice:    Choice offered to:  Adult Children  DME Arranged:    DME Agency:     HH Arranged:    HH Agency:  [place]  Status of Service:  In process, will continue to follow  Medicare Important Message Given:  Yes Date Medicare IM Given:    Medicare IM give by:    Date Additional Medicare IM Given:    Additional Medicare Important Message give by:     If discussed at [place] of Stay Meetings, dates discussed:    Additional Comments:  Dessa Phi, RN 02/28/2015, 2:51 PM

## 2015-02-28 NOTE — Progress Notes (Signed)
Physical Therapy Treatment Patient Details Name: Wendy Velasquez MRN: [id number] DOB: 1925-04-23 Today's Date: 2015-03-10    History of Present Illness 81 yo female adm with SIRS due to LLL PNA: with acute resp distress    PT Comments    Pt ambulating in hallway with daughter upon arrival and reporting 3/4 dyspnea.  Pt performed a few exercises in recliner however fatigues quickly.  Pt also correctly used flutter valve x10.  Follow Up Recommendations  Home health PT     Equipment Recommendations  None recommended by PT    Recommendations for Other Services       Precautions / Restrictions Precautions Precautions: Fall Restrictions Weight Bearing Restrictions: No    Mobility  Bed Mobility                  Transfers                    Ambulation/Gait Ambulation/Gait assistance: Supervision Ambulation Distance (Feet): 30 Feet Assistive device: Rolling walker (2 wheeled) Gait Pattern/deviations: Step-through pattern;Decreased stride length     General Gait Details: pt ambulating in hallway with daughter, observed last 30 feet upon returning to room and checked SpO2 which was 98% room air despite dyspnea 3/4, approx 2 minutes seated rest break for recovery   Stairs            Wheelchair Mobility    Modified Rankin (Stroke Patients Only)       Balance                                    Cognition Arousal/Alertness: Awake/alert Behavior During Therapy: WFL for tasks assessed/performed Overall Cognitive Status: Within Functional Limits for tasks assessed                      Exercises General Exercises - Lower Extremity Ankle Circles/Pumps: AROM;Both;10 reps Long Arc Quad: AROM;Both;10 reps;Seated Heel Slides: AROM;Both;10 reps;Supine Hip ABduction/ADduction: AROM;Supine;10 reps;Both Hip Flexion/Marching: AROM;Seated;Both;10 reps    General Comments        Pertinent Vitals/Pain Pain Assessment: No/denies  pain    Home Living                      Prior Function            PT Goals (current goals can now be found in the care plan section) Progress towards PT goals: Progressing toward goals    Frequency  Min 3X/week    PT Plan Current plan remains appropriate    Co-evaluation             End of Session   Activity Tolerance: Patient limited by fatigue Patient left: in chair;with call bell/phone within reach;with family/visitor present     Time: [id number] PT Time Calculation (min) (ACUTE ONLY): 13 min  Charges:  $Therapeutic Exercise: 8-22 mins                    G Codes:      Edmar Blankenburg,KATHrine E 03-10-15, 12:18 PM Carmelia Bake, PT, DPT March 10, 2015 Pager: [phone]

## 2015-02-28 NOTE — Care Management Important Message (Signed)
Important Message  Patient Details  Name: Wendy Velasquez MRN: [id number] Date of Birth: 02/21/1926   Medicare Important Message Given:  Yes    Camillo Flaming 02/28/2015, 11:38 AMImportant Message  Patient Details  Name: Wendy Velasquez MRN: [id number] Date of Birth: 1925/04/27   Medicare Important Message Given:  Yes    Camillo Flaming 02/28/2015, 11:38 AM

## 2015-03-01 DIAGNOSIS — J441 Chronic obstructive pulmonary disease with (acute) exacerbation: Secondary | ICD-10-CM | POA: Insufficient documentation

## 2015-03-01 DIAGNOSIS — A419 Sepsis, unspecified organism: Secondary | ICD-10-CM | POA: Insufficient documentation

## 2015-03-01 DIAGNOSIS — R5381 Other malaise: Secondary | ICD-10-CM | POA: Insufficient documentation

## 2015-03-01 DIAGNOSIS — K219 Gastro-esophageal reflux disease without esophagitis: Secondary | ICD-10-CM | POA: Insufficient documentation

## 2015-03-01 DIAGNOSIS — F329 Major depressive disorder, single episode, unspecified: Secondary | ICD-10-CM | POA: Insufficient documentation

## 2015-03-01 DIAGNOSIS — F32A Depression, unspecified: Secondary | ICD-10-CM | POA: Insufficient documentation

## 2015-03-01 DIAGNOSIS — E1165 Type 2 diabetes mellitus with hyperglycemia: Secondary | ICD-10-CM | POA: Insufficient documentation

## 2015-03-01 DIAGNOSIS — Z794 Long term (current) use of insulin: Secondary | ICD-10-CM

## 2015-03-01 DIAGNOSIS — E43 Unspecified severe protein-calorie malnutrition: Secondary | ICD-10-CM | POA: Insufficient documentation

## 2015-03-01 LAB — CULTURE, BLOOD (ROUTINE X 2)
Culture: NO GROWTH
Culture: NO GROWTH

## 2015-03-01 LAB — GLUCOSE, CAPILLARY
Glucose-Capillary: 217 mg/dL — ABNORMAL HIGH (ref 65–99)
Glucose-Capillary: 311 mg/dL — ABNORMAL HIGH (ref 65–99)

## 2015-03-01 MED ORDER — AMOXICILLIN-POT CLAVULANATE 875-125 MG PO TABS: 1.0000 | ORAL_TABLET | Freq: Two times a day (BID) | ORAL | Status: DC

## 2015-03-01 MED ORDER — GLUCERNA SHAKE PO LIQD: 237.0000 mL | Freq: Two times a day (BID) | ORAL | Status: DC

## 2015-03-01 MED ORDER — PREDNISONE 20 MG PO TABS: ORAL_TABLET | ORAL | Status: DC

## 2015-03-01 MED ORDER — TRIMETHOPRIM 100 MG PO TABS: 100.0000 mg | ORAL_TABLET | Freq: Every day | ORAL | Status: DC

## 2015-03-01 MED ORDER — GUAIFENESIN-DM 100-10 MG/5ML PO SYRP: 5.0000 mL | ORAL_SOLUTION | ORAL | Status: DC | PRN

## 2015-03-01 NOTE — Progress Notes (Signed)
Patient is stable for discharge. Discharge instructions and medications have been reviewed with the patient and her daughter, with whom the patient will be staying, and all questions answered.   Othella Boyer [facility] 03/01/2015 4:56 PM

## 2015-03-01 NOTE — Discharge Summary (Signed)
Physician Discharge Summary  Wendy Velasquez [id number] DOB: 05-19-1925 DOA: 02/24/2015  PCP: [name], MD  Admit date: 02/24/2015 Discharge date: 03/01/2015  Time spent: 40 minutes  Recommendations for Outpatient Follow-up:  Reassess BP and if needed start antihypertensive therapy  Repeat BMET to follow renal function and electrolytes  CXR in 3-4 weeks to follow complete resolution of infiltrates Will benefit of follow up with pulmonary service to assist adjusting maintenance treatment for COPD  Discharge Diagnoses:  Sepsis due to CAP COPD exacerbation Diabetes type 2 on insulin GERD Severe protein calorie malnutrition HLD Depression Physical deconditioning     Discharge Condition: stable and improved. Discharge home with home health services and instructions to follow with PCP in 2 weeks.  Diet recommendation: low carbohydrates and heart healthy   Filed Weights   02/24/15 2322  Weight: 56.1 kg (123 lb 10.9 oz)    History of present illness:  81 y.o. female diagnosed with PNA on 12/5. Put on course of omnicef. Presents to the ED feeling no better, still having cough, generalized weakness, recurrence of fever. CXR in ED tonight demonstrates LLL infiltrate. Tm in ED is 102.8, tachy to 110s which improves to 90s with IVF. WBC 19k.  Patient admitted for sepsis due to CAP.  Hospital Course:  1-Sepsis due to LLL PNA: with acute resp distress, but no hypoxia -Patient responded appropriately to IV antibiotics and supportive care -patient feeling good and asking to be discharge -sepsis features resolved -will resume advair, continue PRN nebulizer/inhaler home treatments, robitussin and flutter valve -will discharge on prednisone tapering; PO antibiotics to complete a total of 10 days of treatment (at discharge 5 days pending)  2-COPD: patient breathing much better and with improved in her air movement; minimal wheezing -will continue home nebulizer therapy  as needed and inhaler treatments -will discharge on prednisone tapering  -Will continue flutter valve -will continue singulair -advise to decrease cold weather exposure   3-Diabetes: on chronic insulin -will continue lantus and resume oral hypoglycemic regimen - A1C 7.4  4-GERD: will continue PPI  5-poor appetite and weight loss: Severe protein malnutrition   -will continue glucerna TID -Appreciate recommendations from nutritional service, will follow rec's  6-HLD: will continue statins   7-depression: will continue Wellbutrin   8-physical deconditioning:  -following PT rec's will arrange for [facility] service at discharge   Procedures:  See below for x-ray reports   Consultations:  None   Discharge Exam: Filed Vitals:   03/01/15 0516 03/01/15 1356  BP: 151/90 140/55  Pulse: 70 76  Temp: 98 F (36.7 C) 97.8 F (36.6 C)  Resp: 18 16    General: Patient has remained afebrile; improved air movement and feeling ready for discharge.  Denies CP and is able to speak in full sentences.   Cardiovascular: S1 and s2, no rubs or gallops  Respiratory: mild end exp wheezing, scattered rhonchi, improved air movement   Abdomen: soft, NT, ND, positive BS  Musculoskeletal: no LE edema appreciated, no cyanosis    Discharge Instructions   Discharge Instructions    Diet - low sodium heart healthy    Complete by:  As directed      Discharge instructions    Complete by:  As directed   Take medications as prescribed  Please arrange follow up with PCP in 2 weeks Maintain adequate hydration Please follow a low carbohydrates diet     Increase activity slowly    Complete by:  As directed  Current Discharge Medication List    START taking these medications   Details  amoxicillin-clavulanate (AUGMENTIN) 875-125 MG tablet Take 1 tablet by mouth every 12 (twelve) hours. Qty: 10 tablet, Refills: 0    feeding supplement, GLUCERNA SHAKE, (GLUCERNA SHAKE) LIQD Take  237 mLs by mouth 2 (two) times daily between meals. Refills: 0    guaiFENesin-dextromethorphan (ROBITUSSIN DM) 100-10 MG/5ML syrup Take 5 mLs by mouth every 4 (four) hours as needed for cough. Qty: 118 mL, Refills: 0    predniSONE (DELTASONE) 20 MG tablet Take 2 tablets by mouth twice a day X 1 day; then 3 tablets by mouth daily X 2 days; then 2 tablets by mouth Daily X 2 days; then 1 tablet by mouth daily X 2 days; then 1/2 tablet by mouth daily X 2 days and stop prednisone. Qty: 20 tablet, Refills: 0      CONTINUE these medications which have CHANGED   Details  trimethoprim (TRIMPEX) 100 MG tablet Take 1 tablet (100 mg total) by mouth daily. OK TO RESUME AFTER YOU FINISH CURRENT ANTIBIOTIC THERAPY.      CONTINUE these medications which have NOT CHANGED   Details  acetaminophen (TYLENOL) 500 MG tablet Take 1,000 mg by mouth every 6 (six) hours as needed for fever.    albuterol (PROVENTIL) (2.5 MG/3ML) 0.083% nebulizer solution Take 2.5 mg by nebulization every 6 (six) hours as needed for wheezing or shortness of breath.    albuterol-ipratropium (COMBIVENT) 18-103 MCG/ACT inhaler Inhale 2 puffs into the lungs every 6 (six) hours as needed for wheezing.    aspirin EC 81 MG tablet Take 81 mg by mouth daily.    Brinzolamide-Brimonidine (SIMBRINZA) 1-0.2 % SUSP Apply 1 drop to eye at bedtime.    buPROPion (WELLBUTRIN) 100 MG tablet Take 100 mg by mouth daily.    cetirizine (ZYRTEC) 10 MG tablet Take 10 mg by mouth daily.    esomeprazole (NEXIUM) 40 MG capsule Take 40 mg by mouth daily before breakfast.    fluticasone (FLONASE) 50 MCG/ACT nasal spray SHAKE LQ AND U 2 SPRAYS IEN D Refills: 0    Fluticasone-Salmeterol (ADVAIR DISKUS) 250-50 MCG/DOSE AEPB Inhale 1 puff into the lungs 2 (two) times daily.    ibandronate (BONIVA) 150 MG tablet Take 150 mg by mouth every 30 (thirty) days. Take in the morning with a full glass of water, on an empty stomach, and do not take anything else by  mouth or lie down for the next 30 min.    insulin glargine (LANTUS) 100 UNIT/ML injection Inject 12 Units into the skin at bedtime. 9 pm daily    metFORMIN (GLUCOPHAGE) 1000 MG tablet Take 1,000 mg by mouth 2 (two) times daily with a meal.    montelukast (SINGULAIR) 10 MG tablet Take 10 mg by mouth at bedtime.    sertraline (ZOLOFT) 50 MG tablet Take 50 mg by mouth 2 (two) times daily.    simvastatin (ZOCOR) 10 MG tablet Take 10 mg by mouth at bedtime.    vitamin B-12 (CYANOCOBALAMIN) 1000 MCG tablet Take 1,000 mcg by mouth daily.       No Known Allergies Follow-up Information    Follow up with [name], MD. Schedule an appointment as soon as possible for a visit in 2 weeks.   Specialty:  Internal Medicine   Contact information:   [place] [id number] [phone]        The results of significant diagnostics from this hospitalization (including imaging,  microbiology, ancillary and laboratory) are listed below for reference.    Significant Diagnostic Studies: Dg Chest 2 View  02/24/2015  CLINICAL DATA:  Fever and weakness EXAM: CHEST  2 VIEW COMPARISON:  01/05/2015 FINDINGS: Cardiomegaly with chronic vascular congestion evident. Left subclavian 2 lead pacer noted. Very large hiatal hernia projects over the cardiac silhouette. Increased patchy left basilar airspace process compatible with mild left lower lobe pneumonia. Right lung remains relatively clear. No pneumothorax or edema. Atherosclerosis of the aorta. Trachea is midline. IMPRESSION: Patchy left lower lobe airspace process compatible with pneumonia. Other chronic findings as above. Electronically Signed   By: Jerilynn Mages.  Shick M.D.   On: 02/24/2015 21:35    Microbiology: Recent Results (from the past 240 hour(s))  Blood culture (routine x 2)     Status: None   Collection Time: 02/24/15  9:15 PM  Result Value Ref Range Status   Specimen Description BLOOD LEFT FA  Final   Special Requests BOTTLES  DRAWN AEROBIC AND ANAEROBIC 10CC  Final   Culture   Final    NO GROWTH 5 DAYS Performed at [facility]    Report Status 03/01/2015 FINAL  Final  Blood culture (routine x 2)     Status: None   Collection Time: 02/24/15 10:26 PM  Result Value Ref Range Status   Specimen Description BLOOD RIGHT ANTECUBITAL  Final   Special Requests BOTTLES DRAWN AEROBIC AND ANAEROBIC 5ML  Final   Culture   Final    NO GROWTH 5 DAYS Performed at [facility]    Report Status 03/01/2015 FINAL  Final  Culture, sputum-assessment     Status: None   Collection Time: 02/25/15  5:35 PM  Result Value Ref Range Status   Specimen Description SPUTUM  Final   Special Requests NONE  Final   Sputum evaluation   Final    THIS SPECIMEN IS ACCEPTABLE. RESPIRATORY CULTURE REPORT TO FOLLOW.   Report Status 02/25/2015 FINAL  Final  Culture, respiratory (NON-Expectorated)     Status: None   Collection Time: 02/25/15  5:35 PM  Result Value Ref Range Status   Specimen Description SPUTUM  Final   Special Requests NONE  Final   Gram Stain   Final    ABUNDANT WBC FEW SQUAMOUS EPITHELIAL CELLS PRESENT FEW GRAM POSITIVE COCCI IN PAIRS THIS SPECIMEN IS ACCEPTABLE FOR SPUTUM CULTURE Performed at [facility]    Culture   Final    NORMAL OROPHARYNGEAL FLORA Performed at [facility]    Report Status 02/28/2015 FINAL  Final     Labs: Basic Metabolic Panel:  Recent Labs Lab 02/24/15 2120 02/27/15 0528  NA 138 143  K 4.4 3.8  CL 101 106  CO2 25 28  GLUCOSE 287* 137*  BUN 30* 17  CREATININE 0.96 0.63  CALCIUM 9.2 8.6*   CBC:  Recent Labs Lab 02/24/15 2120 02/27/15 0528  WBC 19.6* 9.8  NEUTROABS 17.6*  --   HGB 11.6* 11.4*  HCT 35.7* 36.0  MCV 85.6 88.2  PLT 259 283    CBG:  Recent Labs Lab 02/28/15 1205 02/28/15 1705 02/28/15 2116 03/01/15 0738 03/01/15 1133  GLUCAP 228* 257* 258* 217* 311*    Signed:  Barton Dubois  [facility] 03/01/2015, 4:04  PM

## 2015-03-01 NOTE — Care Management Note (Signed)
Case Management Note  Patient Details  Name: Wendy Velasquez MRN: [id number] Date of Birth: [birth year]  Subjective/Objective: confirmed address patient will stay @ d/c:dtr [facility] c# [id number], [address] Letta Kocher [phone]. [facility] rep Santiago Glad aware of HHRN/PT/aide HHC orders placed, & d/c today.                   Action/Plan:d/c home w/HHC.   Expected Discharge Date:                  Expected Discharge Plan:  [place]  In-House Referral:     Discharge planning Services  CM Consult  Post Acute Care Choice:    Choice offered to:  Adult Children  DME Arranged:    DME Agency:     HH Arranged:  RN, PT, Nurse's Aide [place] Agency:  [place]  Status of Service:  Completed, signed off  Medicare Important Message Given:  Yes Date Medicare IM Given:    Medicare IM give by:    Date Additional Medicare IM Given:    Additional Medicare Important Message give by:     If discussed at [place] of Stay Meetings, dates discussed:    Additional Comments:  Dessa Phi, RN 03/01/2015, 3:45 PM

## 2015-03-01 NOTE — Progress Notes (Signed)
Nutrition Follow-up   DOCUMENTATION CODES:   Severe malnutrition in context of acute illness/injury  INTERVENTION:  - Continue Glucerna Shake BID - Continue Magic Cup once/day - Continue to provide encouragement with meals and supplements - RD will continue to monitor for needs  NUTRITION DIAGNOSIS:   Malnutrition related to acute illness, poor appetite as evidenced by percent weight loss, energy intake < or equal to 50% for > or equal to 5 days, severe depletion of muscle mass, moderate depletion of body fat. -ongoing  GOAL:   Patient will meet greater than or equal to 90% of their needs -likely beginning to meet  MONITOR:   PO intake, Supplement acceptance, Labs, Weight trends, Skin, I & O's  ASSESSMENT:   81 y.o. female diagnosed with PNA on 12/5. Put on course of omnicef. Presents to the ED feeling no better, still having cough, generalized weakness, recurrence of fever.  12/20 Per chart review, pt ate 50% of breakfast 12/18 and 100% of breakfast this AM. Pt was resting at time of visit with daughter at bedside. Daughter reports that pt recently got a shower and feels exhausted at this time. Daughter provides all information. Pt has not had any chewing or swallowing difficulty or abdominal pain or nausea since admission. Pt's appetite has begun to improve and family and staff have been encouraging pt to eat at something at each meal.   Daughter states that pt has been drinking Glucerna BID and she plans to ask MD if supplementation should continue following d/c. RD feels that continued supplementation would be beneficial as POs continue to increase and to help pt regain strength and given malnutrition.  Beginning to meet needs. Medications reviewed. Labs reviewed; CBGs: 133-352 mg/dL, HgbA1c: 7.4%, Ca: 8.6 mg/dL.   12/18 - Pt in room with daughter at bedside.  - Pt reports poor appetite for around 6 weeks PTA.  - Pt currently eating 50% of meals.  - Pt has been ordered  Glucerna shakes and she is drinking them with no issues.  - Pt would like snacks provided, RD to order. - Per daughter, UBW is 140 lb. Pt has lost 9 lb since December 5th (7% weight loss x 2 weeks, significant for time frame). - Nutrition-Focused physical exam completed. Findings are moderate fat depletion, severe muscle depletion, and no edema.    Diet Order:  Diet Carb Modified Fluid consistency:: Thin; Room service appropriate?: Yes  Skin:  Reviewed, no issues  Last BM:  12/19  Height:   Ht Readings from Last 1 Encounters:  02/24/15 5\' 4"  (1.626 m)    Weight:   Wt Readings from Last 1 Encounters:  02/24/15 123 lb 10.9 oz (56.1 kg)    Ideal Body Weight:  54.5 kg  BMI:  Body mass index is 21.22 kg/(m^2).  Estimated Nutritional Needs:   Kcal:  1500-1700  Protein:  75-85g  Fluid:  1.6L/day  EDUCATION NEEDS:   No education needs identified at this time     Jarome Matin, RD, LDN Inpatient Clinical Dietitian Pager # [phone] After hours/weekend pager # [phone]

## 2015-03-02 DIAGNOSIS — R6511 Systemic inflammatory response syndrome (SIRS) of non-infectious origin with acute organ dysfunction: Secondary | ICD-10-CM | POA: Diagnosis not present

## 2015-03-02 DIAGNOSIS — E785 Hyperlipidemia, unspecified: Secondary | ICD-10-CM | POA: Diagnosis not present

## 2015-03-02 DIAGNOSIS — G629 Polyneuropathy, unspecified: Secondary | ICD-10-CM | POA: Diagnosis not present

## 2015-03-02 DIAGNOSIS — Z950 Presence of cardiac pacemaker: Secondary | ICD-10-CM | POA: Diagnosis not present

## 2015-03-02 DIAGNOSIS — E119 Type 2 diabetes mellitus without complications: Secondary | ICD-10-CM | POA: Diagnosis not present

## 2015-03-02 DIAGNOSIS — J9600 Acute respiratory failure, unspecified whether with hypoxia or hypercapnia: Secondary | ICD-10-CM | POA: Diagnosis not present

## 2015-03-02 DIAGNOSIS — J440 Chronic obstructive pulmonary disease with acute lower respiratory infection: Secondary | ICD-10-CM | POA: Diagnosis not present

## 2015-03-02 DIAGNOSIS — A419 Sepsis, unspecified organism: Secondary | ICD-10-CM | POA: Diagnosis not present

## 2015-03-02 DIAGNOSIS — K219 Gastro-esophageal reflux disease without esophagitis: Secondary | ICD-10-CM | POA: Diagnosis not present

## 2015-03-02 DIAGNOSIS — F329 Major depressive disorder, single episode, unspecified: Secondary | ICD-10-CM | POA: Diagnosis not present

## 2015-03-02 DIAGNOSIS — J189 Pneumonia, unspecified organism: Secondary | ICD-10-CM | POA: Diagnosis not present

## 2015-03-02 DIAGNOSIS — Z794 Long term (current) use of insulin: Secondary | ICD-10-CM | POA: Diagnosis not present

## 2015-03-04 DIAGNOSIS — E119 Type 2 diabetes mellitus without complications: Secondary | ICD-10-CM | POA: Diagnosis not present

## 2015-03-04 DIAGNOSIS — J189 Pneumonia, unspecified organism: Secondary | ICD-10-CM | POA: Diagnosis not present

## 2015-03-04 DIAGNOSIS — R6511 Systemic inflammatory response syndrome (SIRS) of non-infectious origin with acute organ dysfunction: Secondary | ICD-10-CM | POA: Diagnosis not present

## 2015-03-04 DIAGNOSIS — J9600 Acute respiratory failure, unspecified whether with hypoxia or hypercapnia: Secondary | ICD-10-CM | POA: Diagnosis not present

## 2015-03-04 DIAGNOSIS — J440 Chronic obstructive pulmonary disease with acute lower respiratory infection: Secondary | ICD-10-CM | POA: Diagnosis not present

## 2015-03-04 DIAGNOSIS — A419 Sepsis, unspecified organism: Secondary | ICD-10-CM | POA: Diagnosis not present

## 2015-03-07 DIAGNOSIS — J440 Chronic obstructive pulmonary disease with acute lower respiratory infection: Secondary | ICD-10-CM | POA: Diagnosis not present

## 2015-03-07 DIAGNOSIS — J189 Pneumonia, unspecified organism: Secondary | ICD-10-CM | POA: Diagnosis not present

## 2015-03-07 DIAGNOSIS — R6511 Systemic inflammatory response syndrome (SIRS) of non-infectious origin with acute organ dysfunction: Secondary | ICD-10-CM | POA: Diagnosis not present

## 2015-03-07 DIAGNOSIS — E119 Type 2 diabetes mellitus without complications: Secondary | ICD-10-CM | POA: Diagnosis not present

## 2015-03-07 DIAGNOSIS — J9600 Acute respiratory failure, unspecified whether with hypoxia or hypercapnia: Secondary | ICD-10-CM | POA: Diagnosis not present

## 2015-03-07 DIAGNOSIS — A419 Sepsis, unspecified organism: Secondary | ICD-10-CM | POA: Diagnosis not present

## 2015-03-08 DIAGNOSIS — A419 Sepsis, unspecified organism: Secondary | ICD-10-CM | POA: Diagnosis not present

## 2015-03-08 DIAGNOSIS — R6511 Systemic inflammatory response syndrome (SIRS) of non-infectious origin with acute organ dysfunction: Secondary | ICD-10-CM | POA: Diagnosis not present

## 2015-03-08 DIAGNOSIS — J189 Pneumonia, unspecified organism: Secondary | ICD-10-CM | POA: Diagnosis not present

## 2015-03-08 DIAGNOSIS — J440 Chronic obstructive pulmonary disease with acute lower respiratory infection: Secondary | ICD-10-CM | POA: Diagnosis not present

## 2015-03-08 DIAGNOSIS — E119 Type 2 diabetes mellitus without complications: Secondary | ICD-10-CM | POA: Diagnosis not present

## 2015-03-08 DIAGNOSIS — J9600 Acute respiratory failure, unspecified whether with hypoxia or hypercapnia: Secondary | ICD-10-CM | POA: Diagnosis not present

## 2015-03-09 DIAGNOSIS — J440 Chronic obstructive pulmonary disease with acute lower respiratory infection: Secondary | ICD-10-CM | POA: Diagnosis not present

## 2015-03-09 DIAGNOSIS — J189 Pneumonia, unspecified organism: Secondary | ICD-10-CM | POA: Diagnosis not present

## 2015-03-09 DIAGNOSIS — A419 Sepsis, unspecified organism: Secondary | ICD-10-CM | POA: Diagnosis not present

## 2015-03-09 DIAGNOSIS — E119 Type 2 diabetes mellitus without complications: Secondary | ICD-10-CM | POA: Diagnosis not present

## 2015-03-09 DIAGNOSIS — R6511 Systemic inflammatory response syndrome (SIRS) of non-infectious origin with acute organ dysfunction: Secondary | ICD-10-CM | POA: Diagnosis not present

## 2015-03-09 DIAGNOSIS — J9600 Acute respiratory failure, unspecified whether with hypoxia or hypercapnia: Secondary | ICD-10-CM | POA: Diagnosis not present

## 2015-03-11 DIAGNOSIS — E119 Type 2 diabetes mellitus without complications: Secondary | ICD-10-CM | POA: Diagnosis not present

## 2015-03-11 DIAGNOSIS — R6511 Systemic inflammatory response syndrome (SIRS) of non-infectious origin with acute organ dysfunction: Secondary | ICD-10-CM | POA: Diagnosis not present

## 2015-03-11 DIAGNOSIS — J9600 Acute respiratory failure, unspecified whether with hypoxia or hypercapnia: Secondary | ICD-10-CM | POA: Diagnosis not present

## 2015-03-11 DIAGNOSIS — J189 Pneumonia, unspecified organism: Secondary | ICD-10-CM | POA: Diagnosis not present

## 2015-03-11 DIAGNOSIS — J440 Chronic obstructive pulmonary disease with acute lower respiratory infection: Secondary | ICD-10-CM | POA: Diagnosis not present

## 2015-03-11 DIAGNOSIS — A419 Sepsis, unspecified organism: Secondary | ICD-10-CM | POA: Diagnosis not present

## 2015-03-14 DIAGNOSIS — J9600 Acute respiratory failure, unspecified whether with hypoxia or hypercapnia: Secondary | ICD-10-CM | POA: Diagnosis not present

## 2015-03-14 DIAGNOSIS — E119 Type 2 diabetes mellitus without complications: Secondary | ICD-10-CM | POA: Diagnosis not present

## 2015-03-14 DIAGNOSIS — J189 Pneumonia, unspecified organism: Secondary | ICD-10-CM | POA: Diagnosis not present

## 2015-03-14 DIAGNOSIS — R6511 Systemic inflammatory response syndrome (SIRS) of non-infectious origin with acute organ dysfunction: Secondary | ICD-10-CM | POA: Diagnosis not present

## 2015-03-14 DIAGNOSIS — A419 Sepsis, unspecified organism: Secondary | ICD-10-CM | POA: Diagnosis not present

## 2015-03-14 DIAGNOSIS — J440 Chronic obstructive pulmonary disease with acute lower respiratory infection: Secondary | ICD-10-CM | POA: Diagnosis not present

## 2015-03-16 DIAGNOSIS — J189 Pneumonia, unspecified organism: Secondary | ICD-10-CM | POA: Diagnosis not present

## 2015-03-16 DIAGNOSIS — J440 Chronic obstructive pulmonary disease with acute lower respiratory infection: Secondary | ICD-10-CM | POA: Diagnosis not present

## 2015-03-16 DIAGNOSIS — A419 Sepsis, unspecified organism: Secondary | ICD-10-CM | POA: Diagnosis not present

## 2015-03-16 DIAGNOSIS — R6511 Systemic inflammatory response syndrome (SIRS) of non-infectious origin with acute organ dysfunction: Secondary | ICD-10-CM | POA: Diagnosis not present

## 2015-03-16 DIAGNOSIS — E119 Type 2 diabetes mellitus without complications: Secondary | ICD-10-CM | POA: Diagnosis not present

## 2015-03-16 DIAGNOSIS — J9600 Acute respiratory failure, unspecified whether with hypoxia or hypercapnia: Secondary | ICD-10-CM | POA: Diagnosis not present

## 2015-03-21 DIAGNOSIS — A419 Sepsis, unspecified organism: Secondary | ICD-10-CM | POA: Diagnosis not present

## 2015-03-21 DIAGNOSIS — J9600 Acute respiratory failure, unspecified whether with hypoxia or hypercapnia: Secondary | ICD-10-CM | POA: Diagnosis not present

## 2015-03-21 DIAGNOSIS — J189 Pneumonia, unspecified organism: Secondary | ICD-10-CM | POA: Diagnosis not present

## 2015-03-21 DIAGNOSIS — R6511 Systemic inflammatory response syndrome (SIRS) of non-infectious origin with acute organ dysfunction: Secondary | ICD-10-CM | POA: Diagnosis not present

## 2015-03-21 DIAGNOSIS — E119 Type 2 diabetes mellitus without complications: Secondary | ICD-10-CM | POA: Diagnosis not present

## 2015-03-21 DIAGNOSIS — J440 Chronic obstructive pulmonary disease with acute lower respiratory infection: Secondary | ICD-10-CM | POA: Diagnosis not present

## 2015-03-23 DIAGNOSIS — J9600 Acute respiratory failure, unspecified whether with hypoxia or hypercapnia: Secondary | ICD-10-CM | POA: Diagnosis not present

## 2015-03-23 DIAGNOSIS — J440 Chronic obstructive pulmonary disease with acute lower respiratory infection: Secondary | ICD-10-CM | POA: Diagnosis not present

## 2015-03-23 DIAGNOSIS — A419 Sepsis, unspecified organism: Secondary | ICD-10-CM | POA: Diagnosis not present

## 2015-03-23 DIAGNOSIS — E119 Type 2 diabetes mellitus without complications: Secondary | ICD-10-CM | POA: Diagnosis not present

## 2015-03-23 DIAGNOSIS — R6511 Systemic inflammatory response syndrome (SIRS) of non-infectious origin with acute organ dysfunction: Secondary | ICD-10-CM | POA: Diagnosis not present

## 2015-03-23 DIAGNOSIS — J189 Pneumonia, unspecified organism: Secondary | ICD-10-CM | POA: Diagnosis not present

## 2015-03-28 DIAGNOSIS — J440 Chronic obstructive pulmonary disease with acute lower respiratory infection: Secondary | ICD-10-CM | POA: Diagnosis not present

## 2015-03-28 DIAGNOSIS — E119 Type 2 diabetes mellitus without complications: Secondary | ICD-10-CM | POA: Diagnosis not present

## 2015-03-28 DIAGNOSIS — R6511 Systemic inflammatory response syndrome (SIRS) of non-infectious origin with acute organ dysfunction: Secondary | ICD-10-CM | POA: Diagnosis not present

## 2015-03-28 DIAGNOSIS — A419 Sepsis, unspecified organism: Secondary | ICD-10-CM | POA: Diagnosis not present

## 2015-03-28 DIAGNOSIS — J189 Pneumonia, unspecified organism: Secondary | ICD-10-CM | POA: Diagnosis not present

## 2015-03-28 DIAGNOSIS — J9600 Acute respiratory failure, unspecified whether with hypoxia or hypercapnia: Secondary | ICD-10-CM | POA: Diagnosis not present

## 2015-03-30 DIAGNOSIS — Z6822 Body mass index (BMI) 22.0-22.9, adult: Secondary | ICD-10-CM | POA: Diagnosis not present

## 2015-03-30 DIAGNOSIS — J45909 Unspecified asthma, uncomplicated: Secondary | ICD-10-CM | POA: Diagnosis not present

## 2015-03-30 DIAGNOSIS — J45901 Unspecified asthma with (acute) exacerbation: Secondary | ICD-10-CM | POA: Diagnosis not present

## 2015-03-30 DIAGNOSIS — A419 Sepsis, unspecified organism: Secondary | ICD-10-CM | POA: Diagnosis not present

## 2015-03-30 DIAGNOSIS — Z950 Presence of cardiac pacemaker: Secondary | ICD-10-CM | POA: Diagnosis not present

## 2015-03-30 DIAGNOSIS — E119 Type 2 diabetes mellitus without complications: Secondary | ICD-10-CM | POA: Diagnosis not present

## 2015-03-30 DIAGNOSIS — R300 Dysuria: Secondary | ICD-10-CM | POA: Diagnosis not present

## 2015-03-30 DIAGNOSIS — N3281 Overactive bladder: Secondary | ICD-10-CM | POA: Diagnosis not present

## 2015-03-30 DIAGNOSIS — J440 Chronic obstructive pulmonary disease with acute lower respiratory infection: Secondary | ICD-10-CM | POA: Diagnosis not present

## 2015-03-30 DIAGNOSIS — M810 Age-related osteoporosis without current pathological fracture: Secondary | ICD-10-CM | POA: Diagnosis not present

## 2015-03-30 DIAGNOSIS — J159 Unspecified bacterial pneumonia: Secondary | ICD-10-CM | POA: Diagnosis not present

## 2015-03-30 DIAGNOSIS — R6511 Systemic inflammatory response syndrome (SIRS) of non-infectious origin with acute organ dysfunction: Secondary | ICD-10-CM | POA: Diagnosis not present

## 2015-03-30 DIAGNOSIS — J9600 Acute respiratory failure, unspecified whether with hypoxia or hypercapnia: Secondary | ICD-10-CM | POA: Diagnosis not present

## 2015-03-30 DIAGNOSIS — E1129 Type 2 diabetes mellitus with other diabetic kidney complication: Secondary | ICD-10-CM | POA: Diagnosis not present

## 2015-03-30 DIAGNOSIS — J189 Pneumonia, unspecified organism: Secondary | ICD-10-CM | POA: Diagnosis not present

## 2015-03-31 DIAGNOSIS — J9600 Acute respiratory failure, unspecified whether with hypoxia or hypercapnia: Secondary | ICD-10-CM | POA: Diagnosis not present

## 2015-03-31 DIAGNOSIS — J189 Pneumonia, unspecified organism: Secondary | ICD-10-CM | POA: Diagnosis not present

## 2015-03-31 DIAGNOSIS — J440 Chronic obstructive pulmonary disease with acute lower respiratory infection: Secondary | ICD-10-CM | POA: Diagnosis not present

## 2015-03-31 DIAGNOSIS — E119 Type 2 diabetes mellitus without complications: Secondary | ICD-10-CM | POA: Diagnosis not present

## 2015-03-31 DIAGNOSIS — R6511 Systemic inflammatory response syndrome (SIRS) of non-infectious origin with acute organ dysfunction: Secondary | ICD-10-CM | POA: Diagnosis not present

## 2015-03-31 DIAGNOSIS — A419 Sepsis, unspecified organism: Secondary | ICD-10-CM | POA: Diagnosis not present

## 2015-04-04 ENCOUNTER — Telehealth: Payer: Self-pay | Admitting: Internal Medicine

## 2015-04-04 ENCOUNTER — Encounter: Payer: Medicare Other | Admitting: *Deleted

## 2015-04-04 NOTE — Telephone Encounter (Signed)
Returned patient's daughter's call.  Advised that transmission was successfully received and that they will receive a letter in the mail with the next remote date on it.  Advised that the transmissions should be automatic, but that if they're not received, we call and request a manual transmission.  Patient's daughter verbalized understanding of explanation and denies additional questions at this time.

## 2015-04-04 NOTE — Telephone Encounter (Signed)
New message     Calling to see if we received her remote transmission

## 2015-04-06 ENCOUNTER — Encounter: Payer: Self-pay | Admitting: Cardiology

## 2015-04-06 DIAGNOSIS — J189 Pneumonia, unspecified organism: Secondary | ICD-10-CM | POA: Diagnosis not present

## 2015-04-06 DIAGNOSIS — A419 Sepsis, unspecified organism: Secondary | ICD-10-CM | POA: Diagnosis not present

## 2015-04-06 DIAGNOSIS — R6511 Systemic inflammatory response syndrome (SIRS) of non-infectious origin with acute organ dysfunction: Secondary | ICD-10-CM | POA: Diagnosis not present

## 2015-04-06 DIAGNOSIS — J440 Chronic obstructive pulmonary disease with acute lower respiratory infection: Secondary | ICD-10-CM | POA: Diagnosis not present

## 2015-04-06 DIAGNOSIS — E119 Type 2 diabetes mellitus without complications: Secondary | ICD-10-CM | POA: Diagnosis not present

## 2015-04-06 DIAGNOSIS — J9600 Acute respiratory failure, unspecified whether with hypoxia or hypercapnia: Secondary | ICD-10-CM | POA: Diagnosis not present

## 2015-04-08 DIAGNOSIS — A419 Sepsis, unspecified organism: Secondary | ICD-10-CM | POA: Diagnosis not present

## 2015-04-08 DIAGNOSIS — R6511 Systemic inflammatory response syndrome (SIRS) of non-infectious origin with acute organ dysfunction: Secondary | ICD-10-CM | POA: Diagnosis not present

## 2015-04-08 DIAGNOSIS — J189 Pneumonia, unspecified organism: Secondary | ICD-10-CM | POA: Diagnosis not present

## 2015-04-08 DIAGNOSIS — J9600 Acute respiratory failure, unspecified whether with hypoxia or hypercapnia: Secondary | ICD-10-CM | POA: Diagnosis not present

## 2015-04-08 DIAGNOSIS — E119 Type 2 diabetes mellitus without complications: Secondary | ICD-10-CM | POA: Diagnosis not present

## 2015-04-08 DIAGNOSIS — J440 Chronic obstructive pulmonary disease with acute lower respiratory infection: Secondary | ICD-10-CM | POA: Diagnosis not present

## 2015-04-11 DIAGNOSIS — J440 Chronic obstructive pulmonary disease with acute lower respiratory infection: Secondary | ICD-10-CM | POA: Diagnosis not present

## 2015-04-11 DIAGNOSIS — R6511 Systemic inflammatory response syndrome (SIRS) of non-infectious origin with acute organ dysfunction: Secondary | ICD-10-CM | POA: Diagnosis not present

## 2015-04-11 DIAGNOSIS — J9600 Acute respiratory failure, unspecified whether with hypoxia or hypercapnia: Secondary | ICD-10-CM | POA: Diagnosis not present

## 2015-04-11 DIAGNOSIS — A419 Sepsis, unspecified organism: Secondary | ICD-10-CM | POA: Diagnosis not present

## 2015-04-11 DIAGNOSIS — J189 Pneumonia, unspecified organism: Secondary | ICD-10-CM | POA: Diagnosis not present

## 2015-04-11 DIAGNOSIS — E119 Type 2 diabetes mellitus without complications: Secondary | ICD-10-CM | POA: Diagnosis not present

## 2015-04-13 DIAGNOSIS — A419 Sepsis, unspecified organism: Secondary | ICD-10-CM | POA: Diagnosis not present

## 2015-04-13 DIAGNOSIS — R6511 Systemic inflammatory response syndrome (SIRS) of non-infectious origin with acute organ dysfunction: Secondary | ICD-10-CM | POA: Diagnosis not present

## 2015-04-13 DIAGNOSIS — E119 Type 2 diabetes mellitus without complications: Secondary | ICD-10-CM | POA: Diagnosis not present

## 2015-04-13 DIAGNOSIS — J189 Pneumonia, unspecified organism: Secondary | ICD-10-CM | POA: Diagnosis not present

## 2015-04-13 DIAGNOSIS — J9600 Acute respiratory failure, unspecified whether with hypoxia or hypercapnia: Secondary | ICD-10-CM | POA: Diagnosis not present

## 2015-04-13 DIAGNOSIS — J440 Chronic obstructive pulmonary disease with acute lower respiratory infection: Secondary | ICD-10-CM | POA: Diagnosis not present

## 2015-04-18 DIAGNOSIS — J189 Pneumonia, unspecified organism: Secondary | ICD-10-CM | POA: Diagnosis not present

## 2015-04-18 DIAGNOSIS — A419 Sepsis, unspecified organism: Secondary | ICD-10-CM | POA: Diagnosis not present

## 2015-04-18 DIAGNOSIS — J440 Chronic obstructive pulmonary disease with acute lower respiratory infection: Secondary | ICD-10-CM | POA: Diagnosis not present

## 2015-04-18 DIAGNOSIS — J9600 Acute respiratory failure, unspecified whether with hypoxia or hypercapnia: Secondary | ICD-10-CM | POA: Diagnosis not present

## 2015-04-18 DIAGNOSIS — E119 Type 2 diabetes mellitus without complications: Secondary | ICD-10-CM | POA: Diagnosis not present

## 2015-04-18 DIAGNOSIS — R6511 Systemic inflammatory response syndrome (SIRS) of non-infectious origin with acute organ dysfunction: Secondary | ICD-10-CM | POA: Diagnosis not present

## 2015-04-22 DIAGNOSIS — E119 Type 2 diabetes mellitus without complications: Secondary | ICD-10-CM | POA: Diagnosis not present

## 2015-04-22 DIAGNOSIS — J440 Chronic obstructive pulmonary disease with acute lower respiratory infection: Secondary | ICD-10-CM | POA: Diagnosis not present

## 2015-04-22 DIAGNOSIS — J9600 Acute respiratory failure, unspecified whether with hypoxia or hypercapnia: Secondary | ICD-10-CM | POA: Diagnosis not present

## 2015-04-22 DIAGNOSIS — A419 Sepsis, unspecified organism: Secondary | ICD-10-CM | POA: Diagnosis not present

## 2015-04-22 DIAGNOSIS — R6511 Systemic inflammatory response syndrome (SIRS) of non-infectious origin with acute organ dysfunction: Secondary | ICD-10-CM | POA: Diagnosis not present

## 2015-04-22 DIAGNOSIS — J189 Pneumonia, unspecified organism: Secondary | ICD-10-CM | POA: Diagnosis not present

## 2015-04-25 DIAGNOSIS — J440 Chronic obstructive pulmonary disease with acute lower respiratory infection: Secondary | ICD-10-CM | POA: Diagnosis not present

## 2015-04-25 DIAGNOSIS — A419 Sepsis, unspecified organism: Secondary | ICD-10-CM | POA: Diagnosis not present

## 2015-04-25 DIAGNOSIS — J189 Pneumonia, unspecified organism: Secondary | ICD-10-CM | POA: Diagnosis not present

## 2015-04-25 DIAGNOSIS — E119 Type 2 diabetes mellitus without complications: Secondary | ICD-10-CM | POA: Diagnosis not present

## 2015-04-25 DIAGNOSIS — J9600 Acute respiratory failure, unspecified whether with hypoxia or hypercapnia: Secondary | ICD-10-CM | POA: Diagnosis not present

## 2015-04-25 DIAGNOSIS — R6511 Systemic inflammatory response syndrome (SIRS) of non-infectious origin with acute organ dysfunction: Secondary | ICD-10-CM | POA: Diagnosis not present

## 2015-04-26 DIAGNOSIS — H5213 Myopia, bilateral: Secondary | ICD-10-CM | POA: Diagnosis not present

## 2015-04-26 DIAGNOSIS — H52223 Regular astigmatism, bilateral: Secondary | ICD-10-CM | POA: Diagnosis not present

## 2015-04-26 DIAGNOSIS — E119 Type 2 diabetes mellitus without complications: Secondary | ICD-10-CM | POA: Diagnosis not present

## 2015-04-27 DIAGNOSIS — J189 Pneumonia, unspecified organism: Secondary | ICD-10-CM | POA: Diagnosis not present

## 2015-04-27 DIAGNOSIS — R6511 Systemic inflammatory response syndrome (SIRS) of non-infectious origin with acute organ dysfunction: Secondary | ICD-10-CM | POA: Diagnosis not present

## 2015-04-27 DIAGNOSIS — J440 Chronic obstructive pulmonary disease with acute lower respiratory infection: Secondary | ICD-10-CM | POA: Diagnosis not present

## 2015-04-27 DIAGNOSIS — J9600 Acute respiratory failure, unspecified whether with hypoxia or hypercapnia: Secondary | ICD-10-CM | POA: Diagnosis not present

## 2015-04-27 DIAGNOSIS — A419 Sepsis, unspecified organism: Secondary | ICD-10-CM | POA: Diagnosis not present

## 2015-04-27 DIAGNOSIS — E119 Type 2 diabetes mellitus without complications: Secondary | ICD-10-CM | POA: Diagnosis not present

## 2015-05-05 DIAGNOSIS — A403 Sepsis due to Streptococcus pneumoniae: Secondary | ICD-10-CM | POA: Diagnosis not present

## 2015-05-05 DIAGNOSIS — Z1389 Encounter for screening for other disorder: Secondary | ICD-10-CM | POA: Diagnosis not present

## 2015-05-05 DIAGNOSIS — D692 Other nonthrombocytopenic purpura: Secondary | ICD-10-CM | POA: Diagnosis not present

## 2015-05-05 DIAGNOSIS — E538 Deficiency of other specified B group vitamins: Secondary | ICD-10-CM | POA: Diagnosis not present

## 2015-05-05 DIAGNOSIS — D508 Other iron deficiency anemias: Secondary | ICD-10-CM | POA: Diagnosis not present

## 2015-05-05 DIAGNOSIS — E1129 Type 2 diabetes mellitus with other diabetic kidney complication: Secondary | ICD-10-CM | POA: Diagnosis not present

## 2015-05-05 DIAGNOSIS — K219 Gastro-esophageal reflux disease without esophagitis: Secondary | ICD-10-CM | POA: Diagnosis not present

## 2015-05-05 DIAGNOSIS — N3281 Overactive bladder: Secondary | ICD-10-CM | POA: Diagnosis not present

## 2015-05-05 DIAGNOSIS — F331 Major depressive disorder, recurrent, moderate: Secondary | ICD-10-CM | POA: Diagnosis not present

## 2015-05-05 DIAGNOSIS — E7800 Pure hypercholesterolemia, unspecified: Secondary | ICD-10-CM | POA: Diagnosis not present

## 2015-05-05 DIAGNOSIS — E559 Vitamin D deficiency, unspecified: Secondary | ICD-10-CM | POA: Diagnosis not present

## 2015-05-05 DIAGNOSIS — Z6823 Body mass index (BMI) 23.0-23.9, adult: Secondary | ICD-10-CM | POA: Diagnosis not present

## 2015-08-24 DIAGNOSIS — H401132 Primary open-angle glaucoma, bilateral, moderate stage: Secondary | ICD-10-CM | POA: Diagnosis not present

## 2015-08-24 DIAGNOSIS — H40013 Open angle with borderline findings, low risk, bilateral: Secondary | ICD-10-CM | POA: Diagnosis not present

## 2015-09-02 DIAGNOSIS — R808 Other proteinuria: Secondary | ICD-10-CM | POA: Diagnosis not present

## 2015-09-02 DIAGNOSIS — Z6824 Body mass index (BMI) 24.0-24.9, adult: Secondary | ICD-10-CM | POA: Diagnosis not present

## 2015-09-02 DIAGNOSIS — N183 Chronic kidney disease, stage 3 (moderate): Secondary | ICD-10-CM | POA: Diagnosis not present

## 2015-09-02 DIAGNOSIS — D508 Other iron deficiency anemias: Secondary | ICD-10-CM | POA: Diagnosis not present

## 2015-09-02 DIAGNOSIS — E7800 Pure hypercholesterolemia, unspecified: Secondary | ICD-10-CM | POA: Diagnosis not present

## 2015-09-02 DIAGNOSIS — D692 Other nonthrombocytopenic purpura: Secondary | ICD-10-CM | POA: Diagnosis not present

## 2015-09-02 DIAGNOSIS — I129 Hypertensive chronic kidney disease with stage 1 through stage 4 chronic kidney disease, or unspecified chronic kidney disease: Secondary | ICD-10-CM | POA: Diagnosis not present

## 2015-09-02 DIAGNOSIS — E559 Vitamin D deficiency, unspecified: Secondary | ICD-10-CM | POA: Diagnosis not present

## 2015-09-02 DIAGNOSIS — N3281 Overactive bladder: Secondary | ICD-10-CM | POA: Diagnosis not present

## 2015-09-02 DIAGNOSIS — M810 Age-related osteoporosis without current pathological fracture: Secondary | ICD-10-CM | POA: Diagnosis not present

## 2015-09-02 DIAGNOSIS — K219 Gastro-esophageal reflux disease without esophagitis: Secondary | ICD-10-CM | POA: Diagnosis not present

## 2015-09-02 DIAGNOSIS — E1129 Type 2 diabetes mellitus with other diabetic kidney complication: Secondary | ICD-10-CM | POA: Diagnosis not present

## 2015-10-06 ENCOUNTER — Ambulatory Visit (INDEPENDENT_AMBULATORY_CARE_PROVIDER_SITE_OTHER): Payer: Medicare Other | Admitting: Internal Medicine

## 2015-10-06 ENCOUNTER — Encounter: Payer: Self-pay | Admitting: Internal Medicine

## 2015-10-06 VITALS — BP 120/80 | HR 88 | Ht 64.0 in | Wt 137.4 lb

## 2015-10-06 DIAGNOSIS — Z950 Presence of cardiac pacemaker: Secondary | ICD-10-CM

## 2015-10-06 DIAGNOSIS — I10 Essential (primary) hypertension: Secondary | ICD-10-CM | POA: Diagnosis not present

## 2015-10-06 DIAGNOSIS — I442 Atrioventricular block, complete: Secondary | ICD-10-CM | POA: Diagnosis not present

## 2015-10-06 LAB — CUP PACEART INCLINIC DEVICE CHECK
Battery Remaining Longevity: 129.6
Battery Voltage: 2.98 V
Brady Statistic RV Percent Paced: 99.96 %
Implantable Lead Implant Date: 20140703
Implantable Lead Location: 753859
Lead Channel Pacing Threshold Amplitude: 0.5 V
Lead Channel Pacing Threshold Amplitude: 0.5 V
Lead Channel Pacing Threshold Pulse Width: 0.4 ms
Lead Channel Pacing Threshold Pulse Width: 0.4 ms
Lead Channel Sensing Intrinsic Amplitude: 3.2 mV
Lead Channel Setting Pacing Pulse Width: 0.4 ms
MDC IDC LEAD IMPLANT DT: 20140703
MDC IDC LEAD LOCATION: 753860
MDC IDC MSMT LEADCHNL RA IMPEDANCE VALUE: 462.5 Ohm
MDC IDC MSMT LEADCHNL RV IMPEDANCE VALUE: 525 Ohm
MDC IDC MSMT LEADCHNL RV PACING THRESHOLD AMPLITUDE: 0.5 V
MDC IDC MSMT LEADCHNL RV PACING THRESHOLD PULSEWIDTH: 0.4 ms
MDC IDC MSMT LEADCHNL RV SENSING INTR AMPL: 12 mV
MDC IDC PG SERIAL: 7522008
MDC IDC SESS DTM: 20170727114202
MDC IDC SET LEADCHNL RA PACING AMPLITUDE: 2 V
MDC IDC SET LEADCHNL RV PACING AMPLITUDE: 0.75 V
MDC IDC SET LEADCHNL RV SENSING SENSITIVITY: 12.5 mV
MDC IDC STAT BRADY RA PERCENT PACED: 0.36 %

## 2015-10-06 NOTE — Progress Notes (Signed)
HPI Wendy Velasquez returns today for followup. She is a very pleasant 82 year old woman with symptomatic tachycardia bradycardia syndrome and complete heart block, status post permanent pacemaker insertion secondary to long pauses. She has done well in the interim. She denies chest pain, shortness of breath, or syncope. She notes rare episodes of peripheral edema but mostly she has felt well.  No Known Allergies   Current Outpatient Prescriptions  Medication Sig Dispense Refill  . acetaminophen (TYLENOL) 500 MG tablet Take 1,000 mg by mouth every 6 (six) hours as needed for fever.    Marland Kitchen albuterol (PROVENTIL) (2.5 MG/3ML) 0.083% nebulizer solution Take 2.5 mg by nebulization every 6 (six) hours as needed for wheezing or shortness of breath.    Marland Kitchen albuterol-ipratropium (COMBIVENT) 18-103 MCG/ACT inhaler Inhale 2 puffs into the lungs every 6 (six) hours as needed for wheezing.    Marland Kitchen aspirin EC 81 MG tablet Take 81 mg by mouth daily.    . Brinzolamide-Brimonidine (SIMBRINZA) 1-0.2 % SUSP Apply 1 drop to eye at bedtime.    Marland Kitchen buPROPion (WELLBUTRIN) 100 MG tablet Take 100 mg by mouth daily.    . cetirizine (ZYRTEC) 10 MG tablet Take 10 mg by mouth daily.    Marland Kitchen esomeprazole (NEXIUM) 40 MG capsule Take 40 mg by mouth daily before breakfast.    . fluticasone (FLONASE) 50 MCG/ACT nasal spray Place 2 sprays into both nostrils daily. SHAKE AS DIRECTED    . Fluticasone-Salmeterol (ADVAIR DISKUS) 250-50 MCG/DOSE AEPB Inhale 1 puff into the lungs 2 (two) times daily.    Marland Kitchen guaiFENesin-dextromethorphan (ROBITUSSIN DM) 100-10 MG/5ML syrup Take 5 mLs by mouth every 4 (four) hours as needed for cough. 118 mL 0  . ibandronate (BONIVA) 150 MG tablet Take 150 mg by mouth every 30 (thirty) days. Take in the morning with a full glass of water, on an empty stomach, and do not take anything else by mouth or lie down for the next 30 min.    . insulin glargine (LANTUS) 100 UNIT/ML injection Inject 12 Units into the skin  at bedtime. 9 pm daily    . metFORMIN (GLUCOPHAGE) 1000 MG tablet Take 1,000 mg by mouth 2 (two) times daily with a meal.    . montelukast (SINGULAIR) 10 MG tablet Take 10 mg by mouth at bedtime.    . sertraline (ZOLOFT) 50 MG tablet Take 50 mg by mouth 2 (two) times daily.    . simvastatin (ZOCOR) 10 MG tablet Take 10 mg by mouth at bedtime.    Marland Kitchen trimethoprim (TRIMPEX) 100 MG tablet Take 1 tablet (100 mg total) by mouth daily. OK TO RESUME AFTER YOU FINISH CURRENT ANTIBIOTIC THERAPY.    . vitamin B-12 (CYANOCOBALAMIN) 1000 MCG tablet Take 1,000 mcg by mouth daily.     No current facility-administered medications for this visit.      Past Medical History:  Diagnosis Date  . Asthma   . Cardiac pacemaker in situ   . Complete heart block ([place])   . COPD (chronic obstructive pulmonary disease) ([place])   . Diverticulosis of colon   . GERD (gastroesophageal reflux disease)   . Glaucoma of both eyes   . History of cardiac arrest    09-11-2012--  symptomatic complete heart block -- hr 20's--  cpr, intubated and temp. pacer until  perm. pacemaker placement  . History of esophageal dilatation    for stricture 2007  . History of hiatal hernia   . History of iron deficiency anemia  hx transfusion's 15 yrs ago , approx 2001  . Hyperlipidemia   . Hypertension   . Peripheral neuropathy ([place])   . Tachy-brady syndrome ([place])   . Type 2 diabetes mellitus ([place])   . Urgency incontinence   . Wears glasses     ROS:   All systems reviewed and negative except as noted in the HPI.   Past Surgical History:  Procedure Laterality Date  . ABDOMINAL HYSTERECTOMY  1976  approx   w/ unilateral salpingoophorectomy  . CATARACT EXTRACTION W/ INTRAOCULAR LENS  IMPLANT, BILATERAL  1990  . COLONOSCOPY WITH ESOPHAGOGASTRODUODENOSCOPY (EGD)  last one 2007  . INTERSTIM IMPLANT PLACEMENT  2012  . INTERSTIM IMPLANT REMOVAL N/A 09/21/2014   Procedure: REMOVAL OF INTERSTIM IMPLANT;  Surgeon: Bjorn Loser, MD;   Location: [facility];  Service: Urology;  Laterality: N/A;  . INTERSTIM IMPLANT REVISION N/A 09/21/2014   Procedure: Barrie Lyme STAGE ONE AND TWO;  Surgeon: Bjorn Loser, MD;  Location: [facility];  Service: Urology;  Laterality: N/A;  . LAPAROTOMY W/ UNILATERAL SALPINGOOPHORECTOMY  1960's  approx  . PERMANENT PACEMAKER INSERTION N/A 09/11/2012   Procedure: PERMANENT PACEMAKER INSERTION;  Surgeon: Evans Lance, MD;  Location: [facility] CATH LAB;  Service: Cardiovascular;  Laterality: N/A;  St. Jude Dual Chamber  . RECTOCELE REPAIR  1991  approx     Family History  Problem Relation Age of Onset  . Prostatitis Father   . Heart failure Sister   . Breast cancer Sister   . Diabetic kidney disease Sister   . Melanoma Brother      Social History   Social History  . Marital status: Married    Spouse name: N/A  . Number of children: N/A  . Years of education: N/A   Occupational History  . Not on file.   Social History Main Topics  . Smoking status: Never Smoker  . Smokeless tobacco: Never Used  . Alcohol use No  . Drug use: No  . Sexual activity: Not on file   Other Topics Concern  . Not on file   Social History Narrative  . No narrative on file     BP 120/80   Pulse 88   Ht 5\' 4"  (1.626 m)   Wt 137 lb 6.4 oz (62.3 kg)   BMI 23.58 kg/m   Physical Exam:  Well appearing elderly woman, NAD HEENT: Unremarkable Neck:  6 cm JVD, no thyromegally Back:  No CVA tenderness Lungs:  Clear with no wheezes, rales, or rhonchi. Well-healed pacemaker incision. HEART:  Regular rate rhythm, no murmurs, no rubs, no clicks Abd:  soft, positive bowel sounds, no organomegally, no rebound, no guarding Ext:  2 plus pulses, no edema, no cyanosis, no clubbing Skin:  No rashes no nodules Neuro:  CN II through XII intact, motor grossly intact  DEVICE  Normal device function.  See PaceArt for details.   Assess/Plan: 1. Heart block - she is asymptomatic, s/p  PPM 2. PPM - her St. Jude DDD PM is working normally. Will recheck in several months. 3. Peripheral edema - she admits to some lower extremity swelling and also to dietary indiscretion. I asked her to reduce her salt intake.  Mikle Bosworth.D.

## 2015-10-06 NOTE — Patient Instructions (Signed)
Medication Instructions:  Your physician recommends that you continue on your current medications as directed. Please refer to the Current Medication list given to you today.   Labwork: None ordered   Testing/Procedures: None ordered   Follow-Up: Your physician wants you to follow-up in: 12 months with Dr Knox Saliva will receive a reminder letter in the mail two months in advance. If you don't receive a letter, please call our office to schedule the follow-up appointment.    Remote monitoring is used to monitor your Pacemaker  from home. This monitoring reduces the number of office visits required to check your device to one time per year. It allows [place] to keep an eye on the functioning of your device to ensure it is working properly. You are scheduled for a device check from home on 01/05/16. You may send your transmission at any time that day. If you have a wireless device, the transmission will be sent automatically. After your physician reviews your transmission, you will receive a postcard with your next transmission date.    Any Other Special Instructions Will Be Listed Below (If Applicable).     If you need a refill on your cardiac medications before your next appointment, please call your pharmacy.

## 2015-10-19 ENCOUNTER — Ambulatory Visit (INDEPENDENT_AMBULATORY_CARE_PROVIDER_SITE_OTHER): Payer: Medicare Other | Admitting: Internal Medicine

## 2015-10-19 ENCOUNTER — Encounter: Payer: Self-pay | Admitting: Internal Medicine

## 2015-10-19 VITALS — BP 128/60 | HR 78 | Ht 64.0 in | Wt 138.4 lb

## 2015-10-19 DIAGNOSIS — E785 Hyperlipidemia, unspecified: Secondary | ICD-10-CM | POA: Diagnosis not present

## 2015-10-19 DIAGNOSIS — I10 Essential (primary) hypertension: Secondary | ICD-10-CM

## 2015-10-19 DIAGNOSIS — Z950 Presence of cardiac pacemaker: Secondary | ICD-10-CM | POA: Diagnosis not present

## 2015-10-19 NOTE — Progress Notes (Signed)
OFFICE NOTE  Chief Complaint:  No complaints  Primary Care Physician: [name], MD  HPI:  Wendy Velasquez is a 82 y.o. female with a past medical history significant for COPD, HTN, GERD, HPL and weakness. She apparently got up to go to the bathroom recently and passed out. When EMS arrived she was noted to be extremely bradycardic. 12-lead EKG showed complete heart block. She was administered atropine in route and loss consciousness and when she awakened she vomited. External pacing was attempted. Upon arrival to the trauma bay she was noted to be in complete heart block with a slow ventricular response. Ventricular rate was less than 20. She was hypotensive, ashen and poorly perfusing. Mental status was altered. She had external pacing pads applied with apparent capture on high output of a rate of 60. When I arrived in the emergency room, family was at the bedside and the patient was noncommunicative on a nonrebreather. Shortly thereafter her pupils became fixed and dilated and she lost muscle tone. Pulses were checked and barely palpable, and loss of mechanical capture was confirmed. CPR was initiated and 1 mg of epinephrine was administered IV. I had notified Dr. Lovena Le with cardiac electrophysiology and he arrived around this time. We immediately called cardiac arrest and started resuscitation. Dr. Lovena Le secured the airway with intubation and I placed a temporary transvenous pacing from the right femoral vein (see separate procedure note for details). We were able to achieve capture fairly quickly with the temporary pacer. After temporary pacing she stabilized with an improved blood pressure of around 123XX123 systolic, however epinephrine was starting to wear off and her blood pressure had dropped into the [id number] to 123XX123 systolic. We had a discussion with the family regarding placement of a permanent pacemaker and they are agreeable to this. She underwent successful pacemaker placement by Dr.  Lovena Le. Her hospitalization was fairly brief afterwards and she recovered quite quickly. Unfortunately she did undergo short period of CPR does have some pain associated with that. He may have aspirated and possibly had a aspiration pneumonitis on top of underlying COPD. She's been on antibiotics for that as well as a urinary tract infection which has been difficult to treat.   Ms. Velasquez returns today and is feeling quite well. 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 She denies any chest pain or shortness of breath. Her energy level is good. She feels like she can at least walk a mile every day with her walker which is quite amazing. She is scheduled to see Dr. Lovena Le next month.  10/19/2015  Wendy Velasquez returns today for follow-up. She denies any new complaints such as chest pain or worsening shortness of breath. She's done very well after placement of her pacemaker. 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 She walks a mile every day. She has to use a walker due to knee problems but is generally pretty active. Recently she had some peripheral edema on his try to decrease salt in her diet and today her swelling is much improved. Blood pressure was initially elevated however recheck today came down to normal levels.  PMHx:  Past Medical History:  Diagnosis Date  . Asthma   . Cardiac pacemaker in situ   . Complete heart block ([place])   . COPD (chronic obstructive pulmonary disease) ([place])   . Diverticulosis of colon   . GERD (gastroesophageal reflux disease)   . Glaucoma of both eyes   . History of cardiac  arrest    09-11-2012--  symptomatic complete heart block -- hr 20's--  cpr, intubated and temp. pacer until  perm. pacemaker placement  . History of esophageal dilatation    for stricture 2007  . History of hiatal hernia   . History of iron deficiency anemia    hx transfusion's 15 yrs ago , approx 2001  . Hyperlipidemia   . Hypertension   . Peripheral neuropathy  ([place])   . Tachy-brady syndrome ([place])   . Type 2 diabetes mellitus ([place])   . Urgency incontinence   . Wears glasses     Past Surgical History:  Procedure Laterality Date  . ABDOMINAL HYSTERECTOMY  1976  approx   w/ unilateral salpingoophorectomy  . CATARACT EXTRACTION W/ INTRAOCULAR LENS  IMPLANT, BILATERAL  1990  . COLONOSCOPY WITH ESOPHAGOGASTRODUODENOSCOPY (EGD)  last one 2007  . INTERSTIM IMPLANT PLACEMENT  2012  . INTERSTIM IMPLANT REMOVAL N/A 09/21/2014   Procedure: REMOVAL OF INTERSTIM IMPLANT;  Surgeon: Bjorn Loser, MD;  Location: [facility];  Service: Urology;  Laterality: N/A;  . INTERSTIM IMPLANT REVISION N/A 09/21/2014   Procedure: Barrie Lyme STAGE ONE AND TWO;  Surgeon: Bjorn Loser, MD;  Location: [facility];  Service: Urology;  Laterality: N/A;  . LAPAROTOMY W/ UNILATERAL SALPINGOOPHORECTOMY  1960's  approx  . PERMANENT PACEMAKER INSERTION N/A 09/11/2012   Procedure: PERMANENT PACEMAKER INSERTION;  Surgeon: Evans Lance, MD;  Location: [facility] CATH LAB;  Service: Cardiovascular;  Laterality: N/A;  St. Jude Dual Chamber  . RECTOCELE REPAIR  1991  approx    FAMHx:  Family History  Problem Relation Age of Onset  . Prostatitis Father   . Heart failure Sister   . Breast cancer Sister   . Diabetic kidney disease Sister   . Melanoma Brother     SOCHx:   reports that she has never smoked. She has never used smokeless tobacco. She reports that she does not drink alcohol or use drugs.  ALLERGIES:  No Known Allergies  ROS: Pertinent items noted in HPI and remainder of comprehensive ROS otherwise negative.  HOME MEDS: Current Outpatient Prescriptions  Medication Sig Dispense Refill  . acetaminophen (TYLENOL) 500 MG tablet Take 1,000 mg by mouth every 6 (six) hours as needed for fever.    Marland Kitchen albuterol (PROVENTIL) (2.5 MG/3ML) 0.083% nebulizer solution Take 2.5 mg by nebulization every 6 (six) hours as needed for wheezing or shortness of  breath.    Marland Kitchen albuterol-ipratropium (COMBIVENT) 18-103 MCG/ACT inhaler Inhale 2 puffs into the lungs every 6 (six) hours as needed for wheezing.    Marland Kitchen aspirin EC 81 MG tablet Take 81 mg by mouth daily.    . Brinzolamide-Brimonidine (SIMBRINZA) 1-0.2 % SUSP Apply 1 drop to eye at bedtime.    Marland Kitchen buPROPion (WELLBUTRIN) 100 MG tablet Take 100 mg by mouth daily.    . cetirizine (ZYRTEC) 10 MG tablet Take 10 mg by mouth daily.    . fluticasone (FLONASE) 50 MCG/ACT nasal spray Place 2 sprays into both nostrils daily. SHAKE AS DIRECTED    . Fluticasone-Salmeterol (ADVAIR DISKUS) 250-50 MCG/DOSE AEPB Inhale 1 puff into the lungs 2 (two) times daily.    Marland Kitchen guaiFENesin-dextromethorphan (ROBITUSSIN DM) 100-10 MG/5ML syrup Take 5 mLs by mouth every 4 (four) hours as needed for cough. 118 mL 0  . ibandronate (BONIVA) 150 MG tablet Take 150 mg by mouth every 30 (thirty) days. Take in the morning with a full glass of water, on an empty stomach, and  do not take anything else by mouth or lie down for the next 30 min.    . insulin glargine (LANTUS) 100 UNIT/ML injection Inject 12 Units into the skin at bedtime. 9 pm daily    . metFORMIN (GLUCOPHAGE) 1000 MG tablet Take 1,000 mg by mouth 2 (two) times daily with a meal.    . montelukast (SINGULAIR) 10 MG tablet Take 10 mg by mouth at bedtime.    Marland Kitchen omeprazole (PRILOSEC) 40 MG capsule Take 40 mg by mouth daily.    . sertraline (ZOLOFT) 50 MG tablet Take 50 mg by mouth 2 (two) times daily.    . simvastatin (ZOCOR) 10 MG tablet Take 10 mg by mouth at bedtime.    Marland Kitchen trimethoprim (TRIMPEX) 100 MG tablet Take 1 tablet (100 mg total) by mouth daily. OK TO RESUME AFTER YOU FINISH CURRENT ANTIBIOTIC THERAPY.    . vitamin B-12 (CYANOCOBALAMIN) 1000 MCG tablet Take 1,000 mcg by mouth daily.     No current facility-administered medications for this visit.     LABS/IMAGING: No results found for this or any previous visit (from the past 48 hour(s)). No results found.  VITALS: BP  128/60   Pulse 78   Ht 5\' 4"  (1.626 m)   Wt 138 lb 6.4 oz (62.8 kg)   BMI 23.76 kg/m   EXAM: General appearance: alert and no distress Neck: no adenopathy, no carotid bruit, no JVD, supple, symmetrical, trachea midline and thyroid not enlarged, symmetric, no tenderness/mass/nodules Lungs: clear to auscultation bilaterally Heart: regular rate and rhythm, S1, S2 normal, no murmur, click, rub or gallop Abdomen: soft, non-tender; bowel sounds normal; no masses,  no organomegaly Extremities: extremities normal, atraumatic, no cyanosis or edema and Pacemaker site appears well-healed without hematoma or erythema Pulses: 2+ and symmetric Skin: Skin color, texture, turgor normal. No rashes or lesions Neurologic: Grossly normal  EKG: A sensed, V paced rhythm at 78  ASSESSMENT: 1. Complete heart block status post dual-chamber St. Jude pacemaker 2. Cardiac arrest-resuscitated 3. COPD 4. Borderline HTN 5. IDDM  PLAN: 1.   Wendy Velasquez is doing quite well. Her energy level is good. She can walk almost a mile every day with her walker which is quite tremendous. Blood pressure seems to be pretty well controlled. Recently she had some lower extremity swelling but that is improved with decreasing salt intake. Overall she is doing quite well. No changes to her medications today. I've encouraged her to continue her exercise. The only thing she noted was her appetite was slightly poor but she did manage to gain 1 pound since we last saw her.  Follow-up annually or sooner as necessary.  Pixie Casino, MD, [facility] Attending Cardiologist [place] 10/19/2015, 10:09 AM

## 2015-10-19 NOTE — Patient Instructions (Signed)
Your physician wants you to follow-up in: ONE YEAR with Dr. Hilty. You will receive a reminder letter in the mail two months in advance. If you don't receive a letter, please call our office to schedule the follow-up appointment.  

## 2015-10-25 DIAGNOSIS — R300 Dysuria: Secondary | ICD-10-CM | POA: Diagnosis not present

## 2015-11-17 DIAGNOSIS — F331 Major depressive disorder, recurrent, moderate: Secondary | ICD-10-CM | POA: Diagnosis not present

## 2015-11-22 DIAGNOSIS — H401132 Primary open-angle glaucoma, bilateral, moderate stage: Secondary | ICD-10-CM | POA: Diagnosis not present

## 2015-12-14 DIAGNOSIS — F331 Major depressive disorder, recurrent, moderate: Secondary | ICD-10-CM | POA: Diagnosis not present

## 2015-12-14 DIAGNOSIS — Z23 Encounter for immunization: Secondary | ICD-10-CM | POA: Diagnosis not present

## 2015-12-14 DIAGNOSIS — Z950 Presence of cardiac pacemaker: Secondary | ICD-10-CM | POA: Diagnosis not present

## 2015-12-14 DIAGNOSIS — Z6833 Body mass index (BMI) 33.0-33.9, adult: Secondary | ICD-10-CM | POA: Diagnosis not present

## 2015-12-14 DIAGNOSIS — N183 Chronic kidney disease, stage 3 (moderate): Secondary | ICD-10-CM | POA: Diagnosis not present

## 2015-12-14 DIAGNOSIS — E1129 Type 2 diabetes mellitus with other diabetic kidney complication: Secondary | ICD-10-CM | POA: Diagnosis not present

## 2015-12-14 DIAGNOSIS — M810 Age-related osteoporosis without current pathological fracture: Secondary | ICD-10-CM | POA: Diagnosis not present

## 2015-12-14 DIAGNOSIS — I129 Hypertensive chronic kidney disease with stage 1 through stage 4 chronic kidney disease, or unspecified chronic kidney disease: Secondary | ICD-10-CM | POA: Diagnosis not present

## 2015-12-14 DIAGNOSIS — D692 Other nonthrombocytopenic purpura: Secondary | ICD-10-CM | POA: Diagnosis not present

## 2015-12-14 DIAGNOSIS — E538 Deficiency of other specified B group vitamins: Secondary | ICD-10-CM | POA: Diagnosis not present

## 2015-12-14 DIAGNOSIS — J4550 Severe persistent asthma, uncomplicated: Secondary | ICD-10-CM | POA: Diagnosis not present

## 2015-12-14 DIAGNOSIS — D508 Other iron deficiency anemias: Secondary | ICD-10-CM | POA: Diagnosis not present

## 2015-12-14 DIAGNOSIS — E7800 Pure hypercholesterolemia, unspecified: Secondary | ICD-10-CM | POA: Diagnosis not present

## 2015-12-19 DIAGNOSIS — F331 Major depressive disorder, recurrent, moderate: Secondary | ICD-10-CM | POA: Diagnosis not present

## 2016-01-05 ENCOUNTER — Ambulatory Visit (INDEPENDENT_AMBULATORY_CARE_PROVIDER_SITE_OTHER): Payer: Medicare Other | Admitting: *Deleted

## 2016-01-05 DIAGNOSIS — R350 Frequency of micturition: Secondary | ICD-10-CM | POA: Diagnosis not present

## 2016-01-05 DIAGNOSIS — N3020 Other chronic cystitis without hematuria: Secondary | ICD-10-CM | POA: Diagnosis not present

## 2016-01-05 DIAGNOSIS — I442 Atrioventricular block, complete: Secondary | ICD-10-CM | POA: Diagnosis not present

## 2016-01-05 DIAGNOSIS — N3946 Mixed incontinence: Secondary | ICD-10-CM | POA: Diagnosis not present

## 2016-01-05 NOTE — Progress Notes (Signed)
Remote pacemaker transmission.   

## 2016-01-13 ENCOUNTER — Encounter: Payer: Self-pay | Admitting: Cardiology

## 2016-01-19 DIAGNOSIS — N3946 Mixed incontinence: Secondary | ICD-10-CM | POA: Diagnosis not present

## 2016-01-31 DIAGNOSIS — F331 Major depressive disorder, recurrent, moderate: Secondary | ICD-10-CM | POA: Diagnosis not present

## 2016-02-01 DIAGNOSIS — R350 Frequency of micturition: Secondary | ICD-10-CM | POA: Diagnosis not present

## 2016-02-01 DIAGNOSIS — N3946 Mixed incontinence: Secondary | ICD-10-CM | POA: Diagnosis not present

## 2016-02-02 LAB — CUP PACEART REMOTE DEVICE CHECK
Battery Remaining Longevity: 125 mo
Brady Statistic AP VS Percent: 1 %
Brady Statistic AS VP Percent: 99 %
Brady Statistic RV Percent Paced: 99 %
Implantable Lead Implant Date: 20140703
Lead Channel Impedance Value: 440 Ohm
Lead Channel Pacing Threshold Amplitude: 0.625 V
Lead Channel Pacing Threshold Pulse Width: 0.4 ms
Lead Channel Sensing Intrinsic Amplitude: 12 mV
Lead Channel Setting Pacing Amplitude: 0.875
Lead Channel Setting Pacing Amplitude: 2 V
Lead Channel Setting Pacing Pulse Width: 0.4 ms
MDC IDC LEAD IMPLANT DT: 20140703
MDC IDC LEAD LOCATION: 753859
MDC IDC LEAD LOCATION: 753860
MDC IDC MSMT BATTERY REMAINING PERCENTAGE: 95.5 %
MDC IDC MSMT BATTERY VOLTAGE: 2.98 V
MDC IDC MSMT LEADCHNL RA PACING THRESHOLD AMPLITUDE: 0.5 V
MDC IDC MSMT LEADCHNL RA PACING THRESHOLD PULSEWIDTH: 0.4 ms
MDC IDC MSMT LEADCHNL RA SENSING INTR AMPL: 2.8 mV
MDC IDC MSMT LEADCHNL RV IMPEDANCE VALUE: 450 Ohm
MDC IDC PG IMPLANT DT: 20140703
MDC IDC PG SERIAL: 7522008
MDC IDC SESS DTM: 20171026060015
MDC IDC SET LEADCHNL RV SENSING SENSITIVITY: 12.5 mV
MDC IDC STAT BRADY AP VP PERCENT: 1 %
MDC IDC STAT BRADY AS VS PERCENT: 1 %
MDC IDC STAT BRADY RA PERCENT PACED: 1 %

## 2016-02-21 DIAGNOSIS — R350 Frequency of micturition: Secondary | ICD-10-CM | POA: Diagnosis not present

## 2016-02-21 DIAGNOSIS — R3914 Feeling of incomplete bladder emptying: Secondary | ICD-10-CM | POA: Diagnosis not present

## 2016-02-21 DIAGNOSIS — N3946 Mixed incontinence: Secondary | ICD-10-CM | POA: Diagnosis not present

## 2016-03-01 DIAGNOSIS — F331 Major depressive disorder, recurrent, moderate: Secondary | ICD-10-CM | POA: Diagnosis not present

## 2016-03-22 DIAGNOSIS — E7800 Pure hypercholesterolemia, unspecified: Secondary | ICD-10-CM | POA: Diagnosis not present

## 2016-03-22 DIAGNOSIS — K219 Gastro-esophageal reflux disease without esophagitis: Secondary | ICD-10-CM | POA: Diagnosis not present

## 2016-03-22 DIAGNOSIS — D692 Other nonthrombocytopenic purpura: Secondary | ICD-10-CM | POA: Diagnosis not present

## 2016-03-22 DIAGNOSIS — F331 Major depressive disorder, recurrent, moderate: Secondary | ICD-10-CM | POA: Diagnosis not present

## 2016-03-22 DIAGNOSIS — E1129 Type 2 diabetes mellitus with other diabetic kidney complication: Secondary | ICD-10-CM | POA: Diagnosis not present

## 2016-03-22 DIAGNOSIS — I129 Hypertensive chronic kidney disease with stage 1 through stage 4 chronic kidney disease, or unspecified chronic kidney disease: Secondary | ICD-10-CM | POA: Diagnosis not present

## 2016-03-22 DIAGNOSIS — H259 Unspecified age-related cataract: Secondary | ICD-10-CM | POA: Diagnosis not present

## 2016-03-22 DIAGNOSIS — D508 Other iron deficiency anemias: Secondary | ICD-10-CM | POA: Diagnosis not present

## 2016-03-22 DIAGNOSIS — J4550 Severe persistent asthma, uncomplicated: Secondary | ICD-10-CM | POA: Diagnosis not present

## 2016-03-22 DIAGNOSIS — N183 Chronic kidney disease, stage 3 (moderate): Secondary | ICD-10-CM | POA: Diagnosis not present

## 2016-03-22 DIAGNOSIS — Z6824 Body mass index (BMI) 24.0-24.9, adult: Secondary | ICD-10-CM | POA: Diagnosis not present

## 2016-03-22 DIAGNOSIS — Z950 Presence of cardiac pacemaker: Secondary | ICD-10-CM | POA: Diagnosis not present

## 2016-04-02 DIAGNOSIS — R05 Cough: Secondary | ICD-10-CM | POA: Diagnosis not present

## 2016-04-02 DIAGNOSIS — J441 Chronic obstructive pulmonary disease with (acute) exacerbation: Secondary | ICD-10-CM | POA: Diagnosis not present

## 2016-04-05 ENCOUNTER — Ambulatory Visit (INDEPENDENT_AMBULATORY_CARE_PROVIDER_SITE_OTHER): Payer: Medicare Other | Admitting: *Deleted

## 2016-04-05 DIAGNOSIS — I442 Atrioventricular block, complete: Secondary | ICD-10-CM

## 2016-04-05 LAB — CUP PACEART REMOTE DEVICE CHECK
Battery Remaining Longevity: 130 mo
Battery Remaining Percentage: 95.5 %
Battery Voltage: 2.98 V
Brady Statistic AP VS Percent: 1 %
Brady Statistic AS VS Percent: 1 %
Date Time Interrogation Session: 20180125070024
Implantable Lead Implant Date: 20140703
Implantable Lead Implant Date: 20140703
Implantable Lead Location: 753859
Implantable Pulse Generator Implant Date: 20140703
Lead Channel Impedance Value: 510 Ohm
Lead Channel Pacing Threshold Amplitude: 0.5 V
Lead Channel Pacing Threshold Amplitude: 0.5 V
Lead Channel Pacing Threshold Pulse Width: 0.4 ms
Lead Channel Sensing Intrinsic Amplitude: 2.8 mV
Lead Channel Setting Pacing Amplitude: 0.75 V
Lead Channel Setting Pacing Pulse Width: 0.4 ms
Lead Channel Setting Sensing Sensitivity: 12.5 mV
MDC IDC LEAD LOCATION: 753860
MDC IDC MSMT LEADCHNL RA PACING THRESHOLD PULSEWIDTH: 0.4 ms
MDC IDC MSMT LEADCHNL RV IMPEDANCE VALUE: 530 Ohm
MDC IDC MSMT LEADCHNL RV SENSING INTR AMPL: 12 mV
MDC IDC PG SERIAL: 7522008
MDC IDC SET LEADCHNL RA PACING AMPLITUDE: 2 V
MDC IDC STAT BRADY AP VP PERCENT: 1 %
MDC IDC STAT BRADY AS VP PERCENT: 99 %
MDC IDC STAT BRADY RA PERCENT PACED: 1 %
MDC IDC STAT BRADY RV PERCENT PACED: 99 %

## 2016-04-05 NOTE — Progress Notes (Signed)
Remote pacemaker transmission.   

## 2016-04-06 ENCOUNTER — Encounter: Payer: Self-pay | Admitting: Cardiology

## 2016-04-09 DIAGNOSIS — E1129 Type 2 diabetes mellitus with other diabetic kidney complication: Secondary | ICD-10-CM | POA: Diagnosis not present

## 2016-04-09 DIAGNOSIS — J441 Chronic obstructive pulmonary disease with (acute) exacerbation: Secondary | ICD-10-CM | POA: Diagnosis not present

## 2016-04-09 DIAGNOSIS — R5381 Other malaise: Secondary | ICD-10-CM | POA: Diagnosis not present

## 2016-04-09 DIAGNOSIS — R05 Cough: Secondary | ICD-10-CM | POA: Diagnosis not present

## 2016-04-27 DIAGNOSIS — R2689 Other abnormalities of gait and mobility: Secondary | ICD-10-CM | POA: Diagnosis not present

## 2016-04-27 DIAGNOSIS — R296 Repeated falls: Secondary | ICD-10-CM | POA: Diagnosis not present

## 2016-04-27 DIAGNOSIS — M6281 Muscle weakness (generalized): Secondary | ICD-10-CM | POA: Diagnosis not present

## 2016-04-27 DIAGNOSIS — J188 Other pneumonia, unspecified organism: Secondary | ICD-10-CM | POA: Diagnosis not present

## 2016-05-03 DIAGNOSIS — M6281 Muscle weakness (generalized): Secondary | ICD-10-CM | POA: Diagnosis not present

## 2016-05-03 DIAGNOSIS — J188 Other pneumonia, unspecified organism: Secondary | ICD-10-CM | POA: Diagnosis not present

## 2016-05-03 DIAGNOSIS — R296 Repeated falls: Secondary | ICD-10-CM | POA: Diagnosis not present

## 2016-05-03 DIAGNOSIS — R2689 Other abnormalities of gait and mobility: Secondary | ICD-10-CM | POA: Diagnosis not present

## 2016-05-07 DIAGNOSIS — R296 Repeated falls: Secondary | ICD-10-CM | POA: Diagnosis not present

## 2016-05-07 DIAGNOSIS — J188 Other pneumonia, unspecified organism: Secondary | ICD-10-CM | POA: Diagnosis not present

## 2016-05-07 DIAGNOSIS — R2689 Other abnormalities of gait and mobility: Secondary | ICD-10-CM | POA: Diagnosis not present

## 2016-05-07 DIAGNOSIS — M6281 Muscle weakness (generalized): Secondary | ICD-10-CM | POA: Diagnosis not present

## 2016-05-08 DIAGNOSIS — J188 Other pneumonia, unspecified organism: Secondary | ICD-10-CM | POA: Diagnosis not present

## 2016-05-08 DIAGNOSIS — M6281 Muscle weakness (generalized): Secondary | ICD-10-CM | POA: Diagnosis not present

## 2016-05-08 DIAGNOSIS — R296 Repeated falls: Secondary | ICD-10-CM | POA: Diagnosis not present

## 2016-05-08 DIAGNOSIS — R2689 Other abnormalities of gait and mobility: Secondary | ICD-10-CM | POA: Diagnosis not present

## 2016-05-10 DIAGNOSIS — R2689 Other abnormalities of gait and mobility: Secondary | ICD-10-CM | POA: Diagnosis not present

## 2016-05-10 DIAGNOSIS — J188 Other pneumonia, unspecified organism: Secondary | ICD-10-CM | POA: Diagnosis not present

## 2016-05-10 DIAGNOSIS — R296 Repeated falls: Secondary | ICD-10-CM | POA: Diagnosis not present

## 2016-05-10 DIAGNOSIS — M6281 Muscle weakness (generalized): Secondary | ICD-10-CM | POA: Diagnosis not present

## 2016-05-11 DIAGNOSIS — M6281 Muscle weakness (generalized): Secondary | ICD-10-CM | POA: Diagnosis not present

## 2016-05-11 DIAGNOSIS — R296 Repeated falls: Secondary | ICD-10-CM | POA: Diagnosis not present

## 2016-05-11 DIAGNOSIS — J188 Other pneumonia, unspecified organism: Secondary | ICD-10-CM | POA: Diagnosis not present

## 2016-05-11 DIAGNOSIS — R2689 Other abnormalities of gait and mobility: Secondary | ICD-10-CM | POA: Diagnosis not present

## 2016-05-14 DIAGNOSIS — J188 Other pneumonia, unspecified organism: Secondary | ICD-10-CM | POA: Diagnosis not present

## 2016-05-14 DIAGNOSIS — M6281 Muscle weakness (generalized): Secondary | ICD-10-CM | POA: Diagnosis not present

## 2016-05-14 DIAGNOSIS — R2689 Other abnormalities of gait and mobility: Secondary | ICD-10-CM | POA: Diagnosis not present

## 2016-05-14 DIAGNOSIS — R296 Repeated falls: Secondary | ICD-10-CM | POA: Diagnosis not present

## 2016-05-15 DIAGNOSIS — R296 Repeated falls: Secondary | ICD-10-CM | POA: Diagnosis not present

## 2016-05-15 DIAGNOSIS — M6281 Muscle weakness (generalized): Secondary | ICD-10-CM | POA: Diagnosis not present

## 2016-05-15 DIAGNOSIS — R2689 Other abnormalities of gait and mobility: Secondary | ICD-10-CM | POA: Diagnosis not present

## 2016-05-15 DIAGNOSIS — J188 Other pneumonia, unspecified organism: Secondary | ICD-10-CM | POA: Diagnosis not present

## 2016-05-17 DIAGNOSIS — R296 Repeated falls: Secondary | ICD-10-CM | POA: Diagnosis not present

## 2016-05-17 DIAGNOSIS — R2689 Other abnormalities of gait and mobility: Secondary | ICD-10-CM | POA: Diagnosis not present

## 2016-05-17 DIAGNOSIS — M6281 Muscle weakness (generalized): Secondary | ICD-10-CM | POA: Diagnosis not present

## 2016-05-17 DIAGNOSIS — J188 Other pneumonia, unspecified organism: Secondary | ICD-10-CM | POA: Diagnosis not present

## 2016-05-21 DIAGNOSIS — R296 Repeated falls: Secondary | ICD-10-CM | POA: Diagnosis not present

## 2016-05-21 DIAGNOSIS — R2689 Other abnormalities of gait and mobility: Secondary | ICD-10-CM | POA: Diagnosis not present

## 2016-05-21 DIAGNOSIS — M6281 Muscle weakness (generalized): Secondary | ICD-10-CM | POA: Diagnosis not present

## 2016-05-21 DIAGNOSIS — J188 Other pneumonia, unspecified organism: Secondary | ICD-10-CM | POA: Diagnosis not present

## 2016-05-22 DIAGNOSIS — M6281 Muscle weakness (generalized): Secondary | ICD-10-CM | POA: Diagnosis not present

## 2016-05-22 DIAGNOSIS — R296 Repeated falls: Secondary | ICD-10-CM | POA: Diagnosis not present

## 2016-05-22 DIAGNOSIS — R2689 Other abnormalities of gait and mobility: Secondary | ICD-10-CM | POA: Diagnosis not present

## 2016-05-22 DIAGNOSIS — J188 Other pneumonia, unspecified organism: Secondary | ICD-10-CM | POA: Diagnosis not present

## 2016-05-23 DIAGNOSIS — R2689 Other abnormalities of gait and mobility: Secondary | ICD-10-CM | POA: Diagnosis not present

## 2016-05-23 DIAGNOSIS — J188 Other pneumonia, unspecified organism: Secondary | ICD-10-CM | POA: Diagnosis not present

## 2016-05-23 DIAGNOSIS — R296 Repeated falls: Secondary | ICD-10-CM | POA: Diagnosis not present

## 2016-05-23 DIAGNOSIS — M6281 Muscle weakness (generalized): Secondary | ICD-10-CM | POA: Diagnosis not present

## 2016-05-24 DIAGNOSIS — R2689 Other abnormalities of gait and mobility: Secondary | ICD-10-CM | POA: Diagnosis not present

## 2016-05-24 DIAGNOSIS — H52223 Regular astigmatism, bilateral: Secondary | ICD-10-CM | POA: Diagnosis not present

## 2016-05-24 DIAGNOSIS — Z9849 Cataract extraction status, unspecified eye: Secondary | ICD-10-CM | POA: Diagnosis not present

## 2016-05-24 DIAGNOSIS — M6281 Muscle weakness (generalized): Secondary | ICD-10-CM | POA: Diagnosis not present

## 2016-05-24 DIAGNOSIS — H524 Presbyopia: Secondary | ICD-10-CM | POA: Diagnosis not present

## 2016-05-24 DIAGNOSIS — Z7984 Long term (current) use of oral hypoglycemic drugs: Secondary | ICD-10-CM | POA: Diagnosis not present

## 2016-05-24 DIAGNOSIS — R296 Repeated falls: Secondary | ICD-10-CM | POA: Diagnosis not present

## 2016-05-24 DIAGNOSIS — Z794 Long term (current) use of insulin: Secondary | ICD-10-CM | POA: Diagnosis not present

## 2016-05-24 DIAGNOSIS — Z961 Presence of intraocular lens: Secondary | ICD-10-CM | POA: Diagnosis not present

## 2016-05-24 DIAGNOSIS — H401132 Primary open-angle glaucoma, bilateral, moderate stage: Secondary | ICD-10-CM | POA: Diagnosis not present

## 2016-05-24 DIAGNOSIS — E119 Type 2 diabetes mellitus without complications: Secondary | ICD-10-CM | POA: Diagnosis not present

## 2016-05-24 DIAGNOSIS — H5213 Myopia, bilateral: Secondary | ICD-10-CM | POA: Diagnosis not present

## 2016-05-24 DIAGNOSIS — J188 Other pneumonia, unspecified organism: Secondary | ICD-10-CM | POA: Diagnosis not present

## 2016-05-28 DIAGNOSIS — E538 Deficiency of other specified B group vitamins: Secondary | ICD-10-CM | POA: Diagnosis not present

## 2016-05-28 DIAGNOSIS — F3341 Major depressive disorder, recurrent, in partial remission: Secondary | ICD-10-CM | POA: Diagnosis not present

## 2016-05-28 DIAGNOSIS — D508 Other iron deficiency anemias: Secondary | ICD-10-CM | POA: Diagnosis not present

## 2016-05-28 DIAGNOSIS — Z6824 Body mass index (BMI) 24.0-24.9, adult: Secondary | ICD-10-CM | POA: Diagnosis not present

## 2016-05-28 DIAGNOSIS — K219 Gastro-esophageal reflux disease without esophagitis: Secondary | ICD-10-CM | POA: Diagnosis not present

## 2016-05-28 DIAGNOSIS — E7800 Pure hypercholesterolemia, unspecified: Secondary | ICD-10-CM | POA: Diagnosis not present

## 2016-05-28 DIAGNOSIS — J4550 Severe persistent asthma, uncomplicated: Secondary | ICD-10-CM | POA: Diagnosis not present

## 2016-05-28 DIAGNOSIS — I129 Hypertensive chronic kidney disease with stage 1 through stage 4 chronic kidney disease, or unspecified chronic kidney disease: Secondary | ICD-10-CM | POA: Diagnosis not present

## 2016-05-28 DIAGNOSIS — E559 Vitamin D deficiency, unspecified: Secondary | ICD-10-CM | POA: Diagnosis not present

## 2016-05-28 DIAGNOSIS — N3281 Overactive bladder: Secondary | ICD-10-CM | POA: Diagnosis not present

## 2016-05-28 DIAGNOSIS — E1129 Type 2 diabetes mellitus with other diabetic kidney complication: Secondary | ICD-10-CM | POA: Diagnosis not present

## 2016-05-28 DIAGNOSIS — R808 Other proteinuria: Secondary | ICD-10-CM | POA: Diagnosis not present

## 2016-05-29 DIAGNOSIS — R2689 Other abnormalities of gait and mobility: Secondary | ICD-10-CM | POA: Diagnosis not present

## 2016-05-29 DIAGNOSIS — M6281 Muscle weakness (generalized): Secondary | ICD-10-CM | POA: Diagnosis not present

## 2016-05-29 DIAGNOSIS — R296 Repeated falls: Secondary | ICD-10-CM | POA: Diagnosis not present

## 2016-05-29 DIAGNOSIS — J188 Other pneumonia, unspecified organism: Secondary | ICD-10-CM | POA: Diagnosis not present

## 2016-05-30 DIAGNOSIS — R296 Repeated falls: Secondary | ICD-10-CM | POA: Diagnosis not present

## 2016-05-30 DIAGNOSIS — M6281 Muscle weakness (generalized): Secondary | ICD-10-CM | POA: Diagnosis not present

## 2016-05-30 DIAGNOSIS — J188 Other pneumonia, unspecified organism: Secondary | ICD-10-CM | POA: Diagnosis not present

## 2016-05-30 DIAGNOSIS — R2689 Other abnormalities of gait and mobility: Secondary | ICD-10-CM | POA: Diagnosis not present

## 2016-06-04 DIAGNOSIS — F3341 Major depressive disorder, recurrent, in partial remission: Secondary | ICD-10-CM | POA: Diagnosis not present

## 2016-06-05 DIAGNOSIS — M6281 Muscle weakness (generalized): Secondary | ICD-10-CM | POA: Diagnosis not present

## 2016-06-05 DIAGNOSIS — R296 Repeated falls: Secondary | ICD-10-CM | POA: Diagnosis not present

## 2016-06-05 DIAGNOSIS — J188 Other pneumonia, unspecified organism: Secondary | ICD-10-CM | POA: Diagnosis not present

## 2016-06-05 DIAGNOSIS — R2689 Other abnormalities of gait and mobility: Secondary | ICD-10-CM | POA: Diagnosis not present

## 2016-06-06 DIAGNOSIS — R296 Repeated falls: Secondary | ICD-10-CM | POA: Diagnosis not present

## 2016-06-06 DIAGNOSIS — J188 Other pneumonia, unspecified organism: Secondary | ICD-10-CM | POA: Diagnosis not present

## 2016-06-06 DIAGNOSIS — R2689 Other abnormalities of gait and mobility: Secondary | ICD-10-CM | POA: Diagnosis not present

## 2016-06-06 DIAGNOSIS — M6281 Muscle weakness (generalized): Secondary | ICD-10-CM | POA: Diagnosis not present

## 2016-06-08 DIAGNOSIS — J188 Other pneumonia, unspecified organism: Secondary | ICD-10-CM | POA: Diagnosis not present

## 2016-06-08 DIAGNOSIS — R2689 Other abnormalities of gait and mobility: Secondary | ICD-10-CM | POA: Diagnosis not present

## 2016-06-08 DIAGNOSIS — R296 Repeated falls: Secondary | ICD-10-CM | POA: Diagnosis not present

## 2016-06-08 DIAGNOSIS — M6281 Muscle weakness (generalized): Secondary | ICD-10-CM | POA: Diagnosis not present

## 2016-06-12 DIAGNOSIS — M6281 Muscle weakness (generalized): Secondary | ICD-10-CM | POA: Diagnosis not present

## 2016-06-12 DIAGNOSIS — R278 Other lack of coordination: Secondary | ICD-10-CM | POA: Diagnosis not present

## 2016-06-12 DIAGNOSIS — R488 Other symbolic dysfunctions: Secondary | ICD-10-CM | POA: Diagnosis not present

## 2016-06-12 DIAGNOSIS — R296 Repeated falls: Secondary | ICD-10-CM | POA: Diagnosis not present

## 2016-06-12 DIAGNOSIS — R2689 Other abnormalities of gait and mobility: Secondary | ICD-10-CM | POA: Diagnosis not present

## 2016-06-13 DIAGNOSIS — R2689 Other abnormalities of gait and mobility: Secondary | ICD-10-CM | POA: Diagnosis not present

## 2016-06-13 DIAGNOSIS — R296 Repeated falls: Secondary | ICD-10-CM | POA: Diagnosis not present

## 2016-06-13 DIAGNOSIS — M6281 Muscle weakness (generalized): Secondary | ICD-10-CM | POA: Diagnosis not present

## 2016-06-13 DIAGNOSIS — R488 Other symbolic dysfunctions: Secondary | ICD-10-CM | POA: Diagnosis not present

## 2016-06-13 DIAGNOSIS — R278 Other lack of coordination: Secondary | ICD-10-CM | POA: Diagnosis not present

## 2016-06-14 DIAGNOSIS — R2689 Other abnormalities of gait and mobility: Secondary | ICD-10-CM | POA: Diagnosis not present

## 2016-06-14 DIAGNOSIS — M6281 Muscle weakness (generalized): Secondary | ICD-10-CM | POA: Diagnosis not present

## 2016-06-14 DIAGNOSIS — R488 Other symbolic dysfunctions: Secondary | ICD-10-CM | POA: Diagnosis not present

## 2016-06-14 DIAGNOSIS — R278 Other lack of coordination: Secondary | ICD-10-CM | POA: Diagnosis not present

## 2016-06-14 DIAGNOSIS — R296 Repeated falls: Secondary | ICD-10-CM | POA: Diagnosis not present

## 2016-06-15 DIAGNOSIS — R296 Repeated falls: Secondary | ICD-10-CM | POA: Diagnosis not present

## 2016-06-15 DIAGNOSIS — R2689 Other abnormalities of gait and mobility: Secondary | ICD-10-CM | POA: Diagnosis not present

## 2016-06-15 DIAGNOSIS — R488 Other symbolic dysfunctions: Secondary | ICD-10-CM | POA: Diagnosis not present

## 2016-06-15 DIAGNOSIS — M6281 Muscle weakness (generalized): Secondary | ICD-10-CM | POA: Diagnosis not present

## 2016-06-15 DIAGNOSIS — R278 Other lack of coordination: Secondary | ICD-10-CM | POA: Diagnosis not present

## 2016-06-18 DIAGNOSIS — R488 Other symbolic dysfunctions: Secondary | ICD-10-CM | POA: Diagnosis not present

## 2016-06-18 DIAGNOSIS — R296 Repeated falls: Secondary | ICD-10-CM | POA: Diagnosis not present

## 2016-06-18 DIAGNOSIS — R2689 Other abnormalities of gait and mobility: Secondary | ICD-10-CM | POA: Diagnosis not present

## 2016-06-18 DIAGNOSIS — M6281 Muscle weakness (generalized): Secondary | ICD-10-CM | POA: Diagnosis not present

## 2016-06-18 DIAGNOSIS — R278 Other lack of coordination: Secondary | ICD-10-CM | POA: Diagnosis not present

## 2016-06-19 DIAGNOSIS — R296 Repeated falls: Secondary | ICD-10-CM | POA: Diagnosis not present

## 2016-06-19 DIAGNOSIS — R2689 Other abnormalities of gait and mobility: Secondary | ICD-10-CM | POA: Diagnosis not present

## 2016-06-19 DIAGNOSIS — R488 Other symbolic dysfunctions: Secondary | ICD-10-CM | POA: Diagnosis not present

## 2016-06-19 DIAGNOSIS — M6281 Muscle weakness (generalized): Secondary | ICD-10-CM | POA: Diagnosis not present

## 2016-06-19 DIAGNOSIS — R278 Other lack of coordination: Secondary | ICD-10-CM | POA: Diagnosis not present

## 2016-06-20 DIAGNOSIS — R488 Other symbolic dysfunctions: Secondary | ICD-10-CM | POA: Diagnosis not present

## 2016-06-20 DIAGNOSIS — R296 Repeated falls: Secondary | ICD-10-CM | POA: Diagnosis not present

## 2016-06-20 DIAGNOSIS — R2689 Other abnormalities of gait and mobility: Secondary | ICD-10-CM | POA: Diagnosis not present

## 2016-06-20 DIAGNOSIS — R278 Other lack of coordination: Secondary | ICD-10-CM | POA: Diagnosis not present

## 2016-06-20 DIAGNOSIS — M6281 Muscle weakness (generalized): Secondary | ICD-10-CM | POA: Diagnosis not present

## 2016-06-22 DIAGNOSIS — R296 Repeated falls: Secondary | ICD-10-CM | POA: Diagnosis not present

## 2016-06-22 DIAGNOSIS — R488 Other symbolic dysfunctions: Secondary | ICD-10-CM | POA: Diagnosis not present

## 2016-06-22 DIAGNOSIS — R2689 Other abnormalities of gait and mobility: Secondary | ICD-10-CM | POA: Diagnosis not present

## 2016-06-22 DIAGNOSIS — R278 Other lack of coordination: Secondary | ICD-10-CM | POA: Diagnosis not present

## 2016-06-22 DIAGNOSIS — M6281 Muscle weakness (generalized): Secondary | ICD-10-CM | POA: Diagnosis not present

## 2016-06-25 DIAGNOSIS — R488 Other symbolic dysfunctions: Secondary | ICD-10-CM | POA: Diagnosis not present

## 2016-06-25 DIAGNOSIS — R2689 Other abnormalities of gait and mobility: Secondary | ICD-10-CM | POA: Diagnosis not present

## 2016-06-25 DIAGNOSIS — R278 Other lack of coordination: Secondary | ICD-10-CM | POA: Diagnosis not present

## 2016-06-25 DIAGNOSIS — M6281 Muscle weakness (generalized): Secondary | ICD-10-CM | POA: Diagnosis not present

## 2016-06-25 DIAGNOSIS — R296 Repeated falls: Secondary | ICD-10-CM | POA: Diagnosis not present

## 2016-06-26 DIAGNOSIS — H401132 Primary open-angle glaucoma, bilateral, moderate stage: Secondary | ICD-10-CM | POA: Diagnosis not present

## 2016-06-27 DIAGNOSIS — M6281 Muscle weakness (generalized): Secondary | ICD-10-CM | POA: Diagnosis not present

## 2016-06-27 DIAGNOSIS — R278 Other lack of coordination: Secondary | ICD-10-CM | POA: Diagnosis not present

## 2016-06-27 DIAGNOSIS — R2689 Other abnormalities of gait and mobility: Secondary | ICD-10-CM | POA: Diagnosis not present

## 2016-06-27 DIAGNOSIS — R296 Repeated falls: Secondary | ICD-10-CM | POA: Diagnosis not present

## 2016-06-27 DIAGNOSIS — R488 Other symbolic dysfunctions: Secondary | ICD-10-CM | POA: Diagnosis not present

## 2016-06-28 DIAGNOSIS — R296 Repeated falls: Secondary | ICD-10-CM | POA: Diagnosis not present

## 2016-06-28 DIAGNOSIS — M6281 Muscle weakness (generalized): Secondary | ICD-10-CM | POA: Diagnosis not present

## 2016-06-28 DIAGNOSIS — R488 Other symbolic dysfunctions: Secondary | ICD-10-CM | POA: Diagnosis not present

## 2016-06-28 DIAGNOSIS — R278 Other lack of coordination: Secondary | ICD-10-CM | POA: Diagnosis not present

## 2016-06-28 DIAGNOSIS — R2689 Other abnormalities of gait and mobility: Secondary | ICD-10-CM | POA: Diagnosis not present

## 2016-07-03 DIAGNOSIS — R278 Other lack of coordination: Secondary | ICD-10-CM | POA: Diagnosis not present

## 2016-07-03 DIAGNOSIS — M6281 Muscle weakness (generalized): Secondary | ICD-10-CM | POA: Diagnosis not present

## 2016-07-03 DIAGNOSIS — R488 Other symbolic dysfunctions: Secondary | ICD-10-CM | POA: Diagnosis not present

## 2016-07-03 DIAGNOSIS — R2689 Other abnormalities of gait and mobility: Secondary | ICD-10-CM | POA: Diagnosis not present

## 2016-07-03 DIAGNOSIS — R296 Repeated falls: Secondary | ICD-10-CM | POA: Diagnosis not present

## 2016-07-04 DIAGNOSIS — R2689 Other abnormalities of gait and mobility: Secondary | ICD-10-CM | POA: Diagnosis not present

## 2016-07-04 DIAGNOSIS — R278 Other lack of coordination: Secondary | ICD-10-CM | POA: Diagnosis not present

## 2016-07-04 DIAGNOSIS — R296 Repeated falls: Secondary | ICD-10-CM | POA: Diagnosis not present

## 2016-07-04 DIAGNOSIS — R488 Other symbolic dysfunctions: Secondary | ICD-10-CM | POA: Diagnosis not present

## 2016-07-04 DIAGNOSIS — M6281 Muscle weakness (generalized): Secondary | ICD-10-CM | POA: Diagnosis not present

## 2016-07-05 ENCOUNTER — Encounter: Payer: Medicare Other | Admitting: *Deleted

## 2016-07-05 ENCOUNTER — Telehealth: Payer: Self-pay | Admitting: Cardiology

## 2016-07-05 ENCOUNTER — Ambulatory Visit (INDEPENDENT_AMBULATORY_CARE_PROVIDER_SITE_OTHER): Payer: Medicare Other | Admitting: *Deleted

## 2016-07-05 DIAGNOSIS — R296 Repeated falls: Secondary | ICD-10-CM | POA: Diagnosis not present

## 2016-07-05 DIAGNOSIS — R278 Other lack of coordination: Secondary | ICD-10-CM | POA: Diagnosis not present

## 2016-07-05 DIAGNOSIS — M6281 Muscle weakness (generalized): Secondary | ICD-10-CM | POA: Diagnosis not present

## 2016-07-05 DIAGNOSIS — R488 Other symbolic dysfunctions: Secondary | ICD-10-CM | POA: Diagnosis not present

## 2016-07-05 DIAGNOSIS — I442 Atrioventricular block, complete: Secondary | ICD-10-CM | POA: Diagnosis not present

## 2016-07-05 DIAGNOSIS — R2689 Other abnormalities of gait and mobility: Secondary | ICD-10-CM | POA: Diagnosis not present

## 2016-07-05 NOTE — Telephone Encounter (Signed)
Confirmed remote transmission w/ pt husband.   

## 2016-07-06 ENCOUNTER — Encounter: Payer: Self-pay | Admitting: Cardiology

## 2016-07-09 ENCOUNTER — Telehealth: Payer: Self-pay | Admitting: Cardiology

## 2016-07-09 DIAGNOSIS — F3341 Major depressive disorder, recurrent, in partial remission: Secondary | ICD-10-CM | POA: Diagnosis not present

## 2016-07-09 NOTE — Telephone Encounter (Signed)
New message     1. Has your device fired? unsure  2. Is you device beeping? Yes (middle icon beeping and blinking at the same time)  3. Are you experiencing draining or swelling at device site?no  4. Are you calling to see if we received your device transmission? no  5. Have you passed out? no

## 2016-07-09 NOTE — Telephone Encounter (Signed)
Spoke w/ pt daughter. She wasn't sure if all 5 of the little orange lights where blinking all at once. I informed her that the monitor is probably getting a softwear update b/c pt is not near the monitor. Informed her that if it continues to call tech support. Pt verbalized understanding.

## 2016-07-10 DIAGNOSIS — R488 Other symbolic dysfunctions: Secondary | ICD-10-CM | POA: Diagnosis not present

## 2016-07-10 DIAGNOSIS — R2689 Other abnormalities of gait and mobility: Secondary | ICD-10-CM | POA: Diagnosis not present

## 2016-07-10 DIAGNOSIS — R278 Other lack of coordination: Secondary | ICD-10-CM | POA: Diagnosis not present

## 2016-07-10 DIAGNOSIS — R296 Repeated falls: Secondary | ICD-10-CM | POA: Diagnosis not present

## 2016-07-10 DIAGNOSIS — M6281 Muscle weakness (generalized): Secondary | ICD-10-CM | POA: Diagnosis not present

## 2016-07-10 NOTE — Progress Notes (Signed)
Remote pacemaker transmission.   

## 2016-07-11 ENCOUNTER — Encounter: Payer: Self-pay | Admitting: Cardiology

## 2016-07-11 LAB — CUP PACEART REMOTE DEVICE CHECK
Battery Remaining Percentage: 95.5 %
Battery Voltage: 2.98 V
Brady Statistic AP VP Percent: 1 %
Brady Statistic AP VS Percent: 1 %
Brady Statistic AS VS Percent: 1 %
Implantable Lead Implant Date: 20140703
Implantable Lead Implant Date: 20140703
Implantable Lead Location: 753860
Implantable Pulse Generator Implant Date: 20140703
Lead Channel Pacing Threshold Amplitude: 0.5 V
Lead Channel Pacing Threshold Amplitude: 0.625 V
Lead Channel Pacing Threshold Pulse Width: 0.4 ms
Lead Channel Sensing Intrinsic Amplitude: 2.1 mV
Lead Channel Setting Pacing Amplitude: 0.875
Lead Channel Setting Pacing Pulse Width: 0.4 ms
MDC IDC LEAD LOCATION: 753859
MDC IDC MSMT BATTERY REMAINING LONGEVITY: 125 mo
MDC IDC MSMT LEADCHNL RA IMPEDANCE VALUE: 430 Ohm
MDC IDC MSMT LEADCHNL RA PACING THRESHOLD PULSEWIDTH: 0.4 ms
MDC IDC MSMT LEADCHNL RV IMPEDANCE VALUE: 440 Ohm
MDC IDC MSMT LEADCHNL RV SENSING INTR AMPL: 12 mV
MDC IDC SESS DTM: 20180426093809
MDC IDC SET LEADCHNL RA PACING AMPLITUDE: 2 V
MDC IDC SET LEADCHNL RV SENSING SENSITIVITY: 12.5 mV
MDC IDC STAT BRADY AS VP PERCENT: 99 %
MDC IDC STAT BRADY RA PERCENT PACED: 1 %
MDC IDC STAT BRADY RV PERCENT PACED: 99 %
Pulse Gen Serial Number: 7522008

## 2016-07-12 DIAGNOSIS — R488 Other symbolic dysfunctions: Secondary | ICD-10-CM | POA: Diagnosis not present

## 2016-07-12 DIAGNOSIS — R278 Other lack of coordination: Secondary | ICD-10-CM | POA: Diagnosis not present

## 2016-07-12 DIAGNOSIS — R296 Repeated falls: Secondary | ICD-10-CM | POA: Diagnosis not present

## 2016-07-12 DIAGNOSIS — R2689 Other abnormalities of gait and mobility: Secondary | ICD-10-CM | POA: Diagnosis not present

## 2016-07-12 DIAGNOSIS — M6281 Muscle weakness (generalized): Secondary | ICD-10-CM | POA: Diagnosis not present

## 2016-07-16 DIAGNOSIS — M6281 Muscle weakness (generalized): Secondary | ICD-10-CM | POA: Diagnosis not present

## 2016-07-16 DIAGNOSIS — R2689 Other abnormalities of gait and mobility: Secondary | ICD-10-CM | POA: Diagnosis not present

## 2016-07-16 DIAGNOSIS — R488 Other symbolic dysfunctions: Secondary | ICD-10-CM | POA: Diagnosis not present

## 2016-07-16 DIAGNOSIS — R296 Repeated falls: Secondary | ICD-10-CM | POA: Diagnosis not present

## 2016-07-16 DIAGNOSIS — R278 Other lack of coordination: Secondary | ICD-10-CM | POA: Diagnosis not present

## 2016-07-17 DIAGNOSIS — R488 Other symbolic dysfunctions: Secondary | ICD-10-CM | POA: Diagnosis not present

## 2016-07-17 DIAGNOSIS — R296 Repeated falls: Secondary | ICD-10-CM | POA: Diagnosis not present

## 2016-07-17 DIAGNOSIS — M6281 Muscle weakness (generalized): Secondary | ICD-10-CM | POA: Diagnosis not present

## 2016-07-17 DIAGNOSIS — R2689 Other abnormalities of gait and mobility: Secondary | ICD-10-CM | POA: Diagnosis not present

## 2016-07-17 DIAGNOSIS — R278 Other lack of coordination: Secondary | ICD-10-CM | POA: Diagnosis not present

## 2016-07-18 DIAGNOSIS — R2689 Other abnormalities of gait and mobility: Secondary | ICD-10-CM | POA: Diagnosis not present

## 2016-07-18 DIAGNOSIS — R296 Repeated falls: Secondary | ICD-10-CM | POA: Diagnosis not present

## 2016-07-18 DIAGNOSIS — R488 Other symbolic dysfunctions: Secondary | ICD-10-CM | POA: Diagnosis not present

## 2016-07-18 DIAGNOSIS — R278 Other lack of coordination: Secondary | ICD-10-CM | POA: Diagnosis not present

## 2016-07-18 DIAGNOSIS — M6281 Muscle weakness (generalized): Secondary | ICD-10-CM | POA: Diagnosis not present

## 2016-07-19 DIAGNOSIS — R296 Repeated falls: Secondary | ICD-10-CM | POA: Diagnosis not present

## 2016-07-19 DIAGNOSIS — R2689 Other abnormalities of gait and mobility: Secondary | ICD-10-CM | POA: Diagnosis not present

## 2016-07-19 DIAGNOSIS — M6281 Muscle weakness (generalized): Secondary | ICD-10-CM | POA: Diagnosis not present

## 2016-07-19 DIAGNOSIS — R488 Other symbolic dysfunctions: Secondary | ICD-10-CM | POA: Diagnosis not present

## 2016-07-19 DIAGNOSIS — R278 Other lack of coordination: Secondary | ICD-10-CM | POA: Diagnosis not present

## 2016-07-23 DIAGNOSIS — R2689 Other abnormalities of gait and mobility: Secondary | ICD-10-CM | POA: Diagnosis not present

## 2016-07-23 DIAGNOSIS — M6281 Muscle weakness (generalized): Secondary | ICD-10-CM | POA: Diagnosis not present

## 2016-07-23 DIAGNOSIS — R488 Other symbolic dysfunctions: Secondary | ICD-10-CM | POA: Diagnosis not present

## 2016-07-23 DIAGNOSIS — R296 Repeated falls: Secondary | ICD-10-CM | POA: Diagnosis not present

## 2016-07-23 DIAGNOSIS — R278 Other lack of coordination: Secondary | ICD-10-CM | POA: Diagnosis not present

## 2016-07-25 DIAGNOSIS — R488 Other symbolic dysfunctions: Secondary | ICD-10-CM | POA: Diagnosis not present

## 2016-07-25 DIAGNOSIS — M6281 Muscle weakness (generalized): Secondary | ICD-10-CM | POA: Diagnosis not present

## 2016-07-25 DIAGNOSIS — R278 Other lack of coordination: Secondary | ICD-10-CM | POA: Diagnosis not present

## 2016-07-25 DIAGNOSIS — R296 Repeated falls: Secondary | ICD-10-CM | POA: Diagnosis not present

## 2016-07-25 DIAGNOSIS — R2689 Other abnormalities of gait and mobility: Secondary | ICD-10-CM | POA: Diagnosis not present

## 2016-07-30 DIAGNOSIS — R2689 Other abnormalities of gait and mobility: Secondary | ICD-10-CM | POA: Diagnosis not present

## 2016-07-30 DIAGNOSIS — R296 Repeated falls: Secondary | ICD-10-CM | POA: Diagnosis not present

## 2016-07-30 DIAGNOSIS — M6281 Muscle weakness (generalized): Secondary | ICD-10-CM | POA: Diagnosis not present

## 2016-07-30 DIAGNOSIS — R278 Other lack of coordination: Secondary | ICD-10-CM | POA: Diagnosis not present

## 2016-07-30 DIAGNOSIS — R488 Other symbolic dysfunctions: Secondary | ICD-10-CM | POA: Diagnosis not present

## 2016-07-31 DIAGNOSIS — R2689 Other abnormalities of gait and mobility: Secondary | ICD-10-CM | POA: Diagnosis not present

## 2016-07-31 DIAGNOSIS — M6281 Muscle weakness (generalized): Secondary | ICD-10-CM | POA: Diagnosis not present

## 2016-07-31 DIAGNOSIS — R296 Repeated falls: Secondary | ICD-10-CM | POA: Diagnosis not present

## 2016-07-31 DIAGNOSIS — R488 Other symbolic dysfunctions: Secondary | ICD-10-CM | POA: Diagnosis not present

## 2016-07-31 DIAGNOSIS — R278 Other lack of coordination: Secondary | ICD-10-CM | POA: Diagnosis not present

## 2016-08-01 DIAGNOSIS — R278 Other lack of coordination: Secondary | ICD-10-CM | POA: Diagnosis not present

## 2016-08-01 DIAGNOSIS — R296 Repeated falls: Secondary | ICD-10-CM | POA: Diagnosis not present

## 2016-08-01 DIAGNOSIS — M6281 Muscle weakness (generalized): Secondary | ICD-10-CM | POA: Diagnosis not present

## 2016-08-01 DIAGNOSIS — R2689 Other abnormalities of gait and mobility: Secondary | ICD-10-CM | POA: Diagnosis not present

## 2016-08-01 DIAGNOSIS — R488 Other symbolic dysfunctions: Secondary | ICD-10-CM | POA: Diagnosis not present

## 2016-08-02 DIAGNOSIS — R488 Other symbolic dysfunctions: Secondary | ICD-10-CM | POA: Diagnosis not present

## 2016-08-02 DIAGNOSIS — R2689 Other abnormalities of gait and mobility: Secondary | ICD-10-CM | POA: Diagnosis not present

## 2016-08-02 DIAGNOSIS — R278 Other lack of coordination: Secondary | ICD-10-CM | POA: Diagnosis not present

## 2016-08-02 DIAGNOSIS — R296 Repeated falls: Secondary | ICD-10-CM | POA: Diagnosis not present

## 2016-08-02 DIAGNOSIS — M6281 Muscle weakness (generalized): Secondary | ICD-10-CM | POA: Diagnosis not present

## 2016-08-03 DIAGNOSIS — N319 Neuromuscular dysfunction of bladder, unspecified: Secondary | ICD-10-CM | POA: Diagnosis not present

## 2016-08-03 DIAGNOSIS — N3946 Mixed incontinence: Secondary | ICD-10-CM | POA: Diagnosis not present

## 2016-08-16 DIAGNOSIS — R350 Frequency of micturition: Secondary | ICD-10-CM | POA: Diagnosis not present

## 2016-08-17 DIAGNOSIS — M6281 Muscle weakness (generalized): Secondary | ICD-10-CM | POA: Diagnosis not present

## 2016-08-17 DIAGNOSIS — M542 Cervicalgia: Secondary | ICD-10-CM | POA: Diagnosis not present

## 2016-08-20 DIAGNOSIS — M6281 Muscle weakness (generalized): Secondary | ICD-10-CM | POA: Diagnosis not present

## 2016-08-20 DIAGNOSIS — M542 Cervicalgia: Secondary | ICD-10-CM | POA: Diagnosis not present

## 2016-08-21 DIAGNOSIS — M6281 Muscle weakness (generalized): Secondary | ICD-10-CM | POA: Diagnosis not present

## 2016-08-21 DIAGNOSIS — M542 Cervicalgia: Secondary | ICD-10-CM | POA: Diagnosis not present

## 2016-08-23 DIAGNOSIS — M542 Cervicalgia: Secondary | ICD-10-CM | POA: Diagnosis not present

## 2016-08-23 DIAGNOSIS — M6281 Muscle weakness (generalized): Secondary | ICD-10-CM | POA: Diagnosis not present

## 2016-08-27 DIAGNOSIS — M542 Cervicalgia: Secondary | ICD-10-CM | POA: Diagnosis not present

## 2016-08-27 DIAGNOSIS — M6281 Muscle weakness (generalized): Secondary | ICD-10-CM | POA: Diagnosis not present

## 2016-08-27 DIAGNOSIS — F3341 Major depressive disorder, recurrent, in partial remission: Secondary | ICD-10-CM | POA: Diagnosis not present

## 2016-08-28 DIAGNOSIS — M6281 Muscle weakness (generalized): Secondary | ICD-10-CM | POA: Diagnosis not present

## 2016-08-28 DIAGNOSIS — M542 Cervicalgia: Secondary | ICD-10-CM | POA: Diagnosis not present

## 2016-08-30 DIAGNOSIS — N3946 Mixed incontinence: Secondary | ICD-10-CM | POA: Diagnosis not present

## 2016-08-30 DIAGNOSIS — N3020 Other chronic cystitis without hematuria: Secondary | ICD-10-CM | POA: Diagnosis not present

## 2016-08-30 DIAGNOSIS — N319 Neuromuscular dysfunction of bladder, unspecified: Secondary | ICD-10-CM | POA: Diagnosis not present

## 2016-08-31 DIAGNOSIS — M6281 Muscle weakness (generalized): Secondary | ICD-10-CM | POA: Diagnosis not present

## 2016-08-31 DIAGNOSIS — M542 Cervicalgia: Secondary | ICD-10-CM | POA: Diagnosis not present

## 2016-09-04 DIAGNOSIS — M542 Cervicalgia: Secondary | ICD-10-CM | POA: Diagnosis not present

## 2016-09-04 DIAGNOSIS — M6281 Muscle weakness (generalized): Secondary | ICD-10-CM | POA: Diagnosis not present

## 2016-09-14 DIAGNOSIS — E538 Deficiency of other specified B group vitamins: Secondary | ICD-10-CM | POA: Diagnosis not present

## 2016-09-14 DIAGNOSIS — Z6824 Body mass index (BMI) 24.0-24.9, adult: Secondary | ICD-10-CM | POA: Diagnosis not present

## 2016-09-14 DIAGNOSIS — M542 Cervicalgia: Secondary | ICD-10-CM | POA: Diagnosis not present

## 2016-09-14 DIAGNOSIS — I129 Hypertensive chronic kidney disease with stage 1 through stage 4 chronic kidney disease, or unspecified chronic kidney disease: Secondary | ICD-10-CM | POA: Diagnosis not present

## 2016-09-14 DIAGNOSIS — E7800 Pure hypercholesterolemia, unspecified: Secondary | ICD-10-CM | POA: Diagnosis not present

## 2016-09-14 DIAGNOSIS — E559 Vitamin D deficiency, unspecified: Secondary | ICD-10-CM | POA: Diagnosis not present

## 2016-09-14 DIAGNOSIS — N3281 Overactive bladder: Secondary | ICD-10-CM | POA: Diagnosis not present

## 2016-09-14 DIAGNOSIS — J4550 Severe persistent asthma, uncomplicated: Secondary | ICD-10-CM | POA: Diagnosis not present

## 2016-09-14 DIAGNOSIS — M6281 Muscle weakness (generalized): Secondary | ICD-10-CM | POA: Diagnosis not present

## 2016-09-14 DIAGNOSIS — E1129 Type 2 diabetes mellitus with other diabetic kidney complication: Secondary | ICD-10-CM | POA: Diagnosis not present

## 2016-09-14 DIAGNOSIS — R808 Other proteinuria: Secondary | ICD-10-CM | POA: Diagnosis not present

## 2016-09-14 DIAGNOSIS — M810 Age-related osteoporosis without current pathological fracture: Secondary | ICD-10-CM | POA: Diagnosis not present

## 2016-09-14 DIAGNOSIS — F3341 Major depressive disorder, recurrent, in partial remission: Secondary | ICD-10-CM | POA: Diagnosis not present

## 2016-09-14 DIAGNOSIS — D508 Other iron deficiency anemias: Secondary | ICD-10-CM | POA: Diagnosis not present

## 2016-09-17 DIAGNOSIS — M542 Cervicalgia: Secondary | ICD-10-CM | POA: Diagnosis not present

## 2016-09-17 DIAGNOSIS — E559 Vitamin D deficiency, unspecified: Secondary | ICD-10-CM | POA: Diagnosis not present

## 2016-09-17 DIAGNOSIS — M6281 Muscle weakness (generalized): Secondary | ICD-10-CM | POA: Diagnosis not present

## 2016-09-19 DIAGNOSIS — N3946 Mixed incontinence: Secondary | ICD-10-CM | POA: Diagnosis not present

## 2016-09-19 DIAGNOSIS — R350 Frequency of micturition: Secondary | ICD-10-CM | POA: Diagnosis not present

## 2016-09-19 DIAGNOSIS — N319 Neuromuscular dysfunction of bladder, unspecified: Secondary | ICD-10-CM | POA: Diagnosis not present

## 2016-09-19 DIAGNOSIS — R3914 Feeling of incomplete bladder emptying: Secondary | ICD-10-CM | POA: Diagnosis not present

## 2016-09-20 DIAGNOSIS — M6281 Muscle weakness (generalized): Secondary | ICD-10-CM | POA: Diagnosis not present

## 2016-09-20 DIAGNOSIS — M542 Cervicalgia: Secondary | ICD-10-CM | POA: Diagnosis not present

## 2016-09-24 DIAGNOSIS — M542 Cervicalgia: Secondary | ICD-10-CM | POA: Diagnosis not present

## 2016-09-24 DIAGNOSIS — M6281 Muscle weakness (generalized): Secondary | ICD-10-CM | POA: Diagnosis not present

## 2016-09-25 DIAGNOSIS — M6281 Muscle weakness (generalized): Secondary | ICD-10-CM | POA: Diagnosis not present

## 2016-09-25 DIAGNOSIS — M542 Cervicalgia: Secondary | ICD-10-CM | POA: Diagnosis not present

## 2016-09-26 DIAGNOSIS — M6281 Muscle weakness (generalized): Secondary | ICD-10-CM | POA: Diagnosis not present

## 2016-09-26 DIAGNOSIS — M542 Cervicalgia: Secondary | ICD-10-CM | POA: Diagnosis not present

## 2016-10-01 DIAGNOSIS — F3341 Major depressive disorder, recurrent, in partial remission: Secondary | ICD-10-CM | POA: Diagnosis not present

## 2016-10-04 DIAGNOSIS — M6281 Muscle weakness (generalized): Secondary | ICD-10-CM | POA: Diagnosis not present

## 2016-10-04 DIAGNOSIS — M542 Cervicalgia: Secondary | ICD-10-CM | POA: Diagnosis not present

## 2016-10-05 DIAGNOSIS — M6281 Muscle weakness (generalized): Secondary | ICD-10-CM | POA: Diagnosis not present

## 2016-10-05 DIAGNOSIS — M542 Cervicalgia: Secondary | ICD-10-CM | POA: Diagnosis not present

## 2016-10-08 ENCOUNTER — Encounter: Payer: Self-pay | Admitting: *Deleted

## 2016-10-08 DIAGNOSIS — M6281 Muscle weakness (generalized): Secondary | ICD-10-CM | POA: Diagnosis not present

## 2016-10-08 DIAGNOSIS — M542 Cervicalgia: Secondary | ICD-10-CM | POA: Diagnosis not present

## 2016-10-09 ENCOUNTER — Ambulatory Visit (INDEPENDENT_AMBULATORY_CARE_PROVIDER_SITE_OTHER): Payer: Medicare Other | Admitting: *Deleted

## 2016-10-09 DIAGNOSIS — M6281 Muscle weakness (generalized): Secondary | ICD-10-CM | POA: Diagnosis not present

## 2016-10-09 DIAGNOSIS — M542 Cervicalgia: Secondary | ICD-10-CM | POA: Diagnosis not present

## 2016-10-09 DIAGNOSIS — I442 Atrioventricular block, complete: Secondary | ICD-10-CM | POA: Diagnosis not present

## 2016-10-09 NOTE — Progress Notes (Signed)
Remote pacemaker transmission.   

## 2016-10-11 DIAGNOSIS — M6281 Muscle weakness (generalized): Secondary | ICD-10-CM | POA: Diagnosis not present

## 2016-10-11 DIAGNOSIS — M542 Cervicalgia: Secondary | ICD-10-CM | POA: Diagnosis not present

## 2016-10-12 ENCOUNTER — Encounter: Payer: Self-pay | Admitting: Cardiology

## 2016-10-15 DIAGNOSIS — M542 Cervicalgia: Secondary | ICD-10-CM | POA: Diagnosis not present

## 2016-10-15 DIAGNOSIS — M6281 Muscle weakness (generalized): Secondary | ICD-10-CM | POA: Diagnosis not present

## 2016-10-16 DIAGNOSIS — M542 Cervicalgia: Secondary | ICD-10-CM | POA: Diagnosis not present

## 2016-10-16 DIAGNOSIS — M6281 Muscle weakness (generalized): Secondary | ICD-10-CM | POA: Diagnosis not present

## 2016-10-17 DIAGNOSIS — Z6824 Body mass index (BMI) 24.0-24.9, adult: Secondary | ICD-10-CM | POA: Diagnosis not present

## 2016-10-17 DIAGNOSIS — M4302 Spondylolysis, cervical region: Secondary | ICD-10-CM | POA: Diagnosis not present

## 2016-10-17 DIAGNOSIS — H401132 Primary open-angle glaucoma, bilateral, moderate stage: Secondary | ICD-10-CM | POA: Diagnosis not present

## 2016-10-18 ENCOUNTER — Encounter: Payer: Self-pay | Admitting: Internal Medicine

## 2016-10-18 ENCOUNTER — Ambulatory Visit (INDEPENDENT_AMBULATORY_CARE_PROVIDER_SITE_OTHER): Payer: Medicare Other | Admitting: Internal Medicine

## 2016-10-18 VITALS — BP 140/76 | HR 81 | Ht 64.0 in | Wt 138.0 lb

## 2016-10-18 DIAGNOSIS — I10 Essential (primary) hypertension: Secondary | ICD-10-CM | POA: Diagnosis not present

## 2016-10-18 DIAGNOSIS — Z950 Presence of cardiac pacemaker: Secondary | ICD-10-CM

## 2016-10-18 DIAGNOSIS — I442 Atrioventricular block, complete: Secondary | ICD-10-CM

## 2016-10-18 NOTE — Patient Instructions (Signed)
Your physician recommends that you schedule a follow-up appointment as needed with Dr. Hilty.  

## 2016-10-18 NOTE — Progress Notes (Signed)
OFFICE NOTE  Chief Complaint:  No complaints  Primary Care Physician: Tisovec, Fransico Him, MD  HPI:  Wendy Velasquez is a 83 y.o. female with a past medical history significant for COPD, HTN, GERD, HPL and weakness. She apparently got up to go to the bathroom recently and passed out. When EMS arrived she was noted to be extremely bradycardic. 12-lead EKG showed complete heart block. She was administered atropine in route and loss consciousness and when she awakened she vomited. External pacing was attempted. Upon arrival to the trauma bay she was noted to be in complete heart block with a slow ventricular response. Ventricular rate was less than 20. She was hypotensive, ashen and poorly perfusing. Mental status was altered. She had external pacing pads applied with apparent capture on high output of a rate of 60. When I arrived in the emergency room, family was at the bedside and the patient was noncommunicative on a nonrebreather. Shortly thereafter her pupils became fixed and dilated and she lost muscle tone. Pulses were checked and barely palpable, and loss of mechanical capture was confirmed. CPR was initiated and 1 mg of epinephrine was administered IV. I had notified Dr. Lovena Le with cardiac electrophysiology and he arrived around this time. We immediately called cardiac arrest and started resuscitation. Dr. Lovena Le secured the airway with intubation and I placed a temporary transvenous pacing from the right femoral vein (see separate procedure note for details). We were able to achieve capture fairly quickly with the temporary pacer. After temporary pacing she stabilized with an improved blood pressure of around 093 systolic, however epinephrine was starting to wear off and her blood pressure had dropped into the 81W to 29H systolic. We had a discussion with the family regarding placement of a permanent pacemaker and they are agreeable to this. She underwent successful pacemaker placement by Dr.  Lovena Le. Her hospitalization was fairly brief afterwards and she recovered quite quickly. Unfortunately she did undergo short period of CPR does have some pain associated with that. He may have aspirated and possibly had a aspiration pneumonitis on top of underlying COPD. She's been on antibiotics for that as well as a urinary tract infection which has been difficult to treat.   Ms. Wendy Velasquez returns today and is feeling quite well. 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 She denies any chest pain or shortness of breath. Her energy level is good. She feels like she can at least walk a mile every day with her walker which is quite amazing. She is scheduled to see Dr. Lovena Le next month.  10/19/2015  Wendy Velasquez returns today for follow-up. She denies any new complaints such as chest pain or worsening shortness of breath. She's done very well after placement of her pacemaker. 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 She walks a mile every day. She has to use a walker due to knee problems but is generally pretty active. Recently she had some peripheral edema on his try to decrease salt in her diet and today her swelling is much improved. Blood pressure was initially elevated however recheck today came down to normal levels.  10/18/2016  Wendy Velasquez was seen today in follow-up. She continues to do amazingly well after pacemaker placement. She denies any chest pain, worsening shortness of breath, fatigue or associated symptoms. She is due to see Dr. Lovena Le in a few weeks. She has mild COPD which is well-controlled.   PMHx:  Past Medical History:  Diagnosis Date  .  Asthma   . Cardiac pacemaker in situ   . Complete heart block ([place])   . COPD (chronic obstructive pulmonary disease) ([place])   . Diverticulosis of colon   . GERD (gastroesophageal reflux disease)   . Glaucoma of both eyes   . History of cardiac arrest    09-11-2012--  symptomatic complete heart block -- hr 20's--  cpr,  intubated and temp. pacer until  perm. pacemaker placement  . History of esophageal dilatation    for stricture 2007  . History of hiatal hernia   . History of iron deficiency anemia    hx transfusion's 15 yrs ago , approx 2001  . Hyperlipidemia   . Hypertension   . Peripheral neuropathy   . Tachy-brady syndrome ([place])   . Type 2 diabetes mellitus ([place])   . Urgency incontinence   . Wears glasses     Past Surgical History:  Procedure Laterality Date  . ABDOMINAL HYSTERECTOMY  1976  approx   w/ unilateral salpingoophorectomy  . CATARACT EXTRACTION W/ INTRAOCULAR LENS  IMPLANT, BILATERAL  1990  . COLONOSCOPY WITH ESOPHAGOGASTRODUODENOSCOPY (EGD)  last one 2007  . INTERSTIM IMPLANT PLACEMENT  2012  . INTERSTIM IMPLANT REMOVAL N/A 09/21/2014   Procedure: REMOVAL OF INTERSTIM IMPLANT;  Surgeon: Bjorn Loser, MD;  [facility];  Service: Urology;  Laterality: N/A;  . INTERSTIM IMPLANT REVISION N/A 09/21/2014   Procedure: Barrie Lyme STAGE ONE AND TWO;  Surgeon: Bjorn Loser, MD;  Location: [facility];  Service: Urology;  Laterality: N/A;  . LAPAROTOMY W/ UNILATERAL SALPINGOOPHORECTOMY  1960's  approx  . PERMANENT PACEMAKER INSERTION N/A 09/11/2012   Procedure: PERMANENT PACEMAKER INSERTION;  Surgeon: Evans Lance, MD;  Location: [facility] CATH LAB;  Service: Cardiovascular;  Laterality: N/A;  St. Jude Dual Chamber  . RECTOCELE REPAIR  1991  approx    FAMHx:  Family History  Problem Relation Age of Onset  . Prostatitis Father   . Heart failure Sister   . Breast cancer Sister   . Diabetic kidney disease Sister   . Melanoma Brother     SOCHx:   reports that she has never smoked. She has never used smokeless tobacco. She reports that she does not drink alcohol or use drugs.  ALLERGIES:  No Known Allergies  ROS: Pertinent items noted in HPI and remainder of comprehensive ROS otherwise negative.  HOME MEDS: Current Outpatient Prescriptions    Medication Sig Dispense Refill  . acetaminophen (TYLENOL) 500 MG tablet Take 1,000 mg by mouth every 6 (six) hours as needed for fever.    Marland Kitchen albuterol (PROVENTIL) (2.5 MG/3ML) 0.083% nebulizer solution Take 2.5 mg by nebulization every 6 (six) hours as needed for wheezing or shortness of breath.    Marland Kitchen albuterol-ipratropium (COMBIVENT) 18-103 MCG/ACT inhaler Inhale 2 puffs into the lungs every 6 (six) hours as needed for wheezing.    Marland Kitchen aspirin EC 81 MG tablet Take 81 mg by mouth daily.    . Brinzolamide-Brimonidine (SIMBRINZA) 1-0.2 % SUSP Apply 1 drop to eye at bedtime.    . cetirizine (ZYRTEC) 10 MG tablet Take 10 mg by mouth daily.    . fluticasone (FLONASE) 50 MCG/ACT nasal spray Place 2 sprays into both nostrils as needed. SHAKE AS DIRECTED     . Fluticasone-Salmeterol (ADVAIR DISKUS) 250-50 MCG/DOSE AEPB Inhale 1 puff into the lungs 2 (two) times daily.    Marland Kitchen ibandronate (BONIVA) 150 MG tablet Take 150 mg by mouth every 30 (thirty) days. Take in  the morning with a full glass of water, on an empty stomach, and do not take anything else by mouth or lie down for the next 30 min.    . insulin glargine (LANTUS) 100 UNIT/ML injection Inject 12 Units into the skin at bedtime. 9 pm daily    . metFORMIN (GLUCOPHAGE) 1000 MG tablet Take 1,000 mg by mouth 2 (two) times daily with a meal.    . sertraline (ZOLOFT) 50 MG tablet Take 50 mg by mouth 2 (two) times daily.    Marland Kitchen trimethoprim (TRIMPEX) 100 MG tablet Take 1 tablet (100 mg total) by mouth daily. OK TO RESUME AFTER YOU FINISH CURRENT ANTIBIOTIC THERAPY.    . vitamin B-12 (CYANOCOBALAMIN) 1000 MCG tablet Take 1,000 mcg by mouth daily.     No current facility-administered medications for this visit.     LABS/IMAGING: No results found for this or any previous visit (from the past 48 hour(s)). No results found.  VITALS: BP 140/76   Pulse 81   Ht 5\' 4"  (1.626 m)   Wt 138 lb (62.6 kg)   BMI 23.69 kg/m   EXAM: General appearance: alert and no  distress Neck: no adenopathy, no carotid bruit, no JVD, supple, symmetrical, trachea midline and thyroid not enlarged, symmetric, no tenderness/mass/nodules Lungs: clear to auscultation bilaterally Heart: regular rate and rhythm, S1, S2 normal, no murmur, click, rub or gallop Abdomen: soft, non-tender; bowel sounds normal; no masses,  no organomegaly Extremities: extremities normal, atraumatic, no cyanosis or edema and Pacemaker site appears well-healed without hematoma or erythema Pulses: 2+ and symmetric Skin: Skin color, texture, turgor normal. No rashes or lesions Neurologic: Grossly normal  EKG: Atrial sensed, ventricular paced rhythm at 81-personally reviewed  ASSESSMENT: 1. Complete heart block status post dual-chamber St. Jude pacemaker 2. Cardiac arrest-resuscitated 3. COPD 4. Borderline HTN 5. IDDM  PLAN: 1.   Mrs. Haydon continues to do well - other than her pacemaker, there appear to be no other cardiac issues. While I thoroughly enjoy seeing her in the office, it would be appropriate for her to follow-up with Dr. Lovena Le annually and me on an as needed basis. She is agreeable to this.  Follow-up as necessary.  Pixie Casino, MD, [facility] Attending Cardiologist [place] 10/18/2016, 5:31 PM

## 2016-10-22 DIAGNOSIS — M6281 Muscle weakness (generalized): Secondary | ICD-10-CM | POA: Diagnosis not present

## 2016-10-22 DIAGNOSIS — M542 Cervicalgia: Secondary | ICD-10-CM | POA: Diagnosis not present

## 2016-10-24 DIAGNOSIS — M6281 Muscle weakness (generalized): Secondary | ICD-10-CM | POA: Diagnosis not present

## 2016-10-24 DIAGNOSIS — M542 Cervicalgia: Secondary | ICD-10-CM | POA: Diagnosis not present

## 2016-10-27 DIAGNOSIS — M6281 Muscle weakness (generalized): Secondary | ICD-10-CM | POA: Diagnosis not present

## 2016-10-27 DIAGNOSIS — M542 Cervicalgia: Secondary | ICD-10-CM | POA: Diagnosis not present

## 2016-10-29 DIAGNOSIS — N390 Urinary tract infection, site not specified: Secondary | ICD-10-CM | POA: Diagnosis not present

## 2016-10-29 DIAGNOSIS — R300 Dysuria: Secondary | ICD-10-CM | POA: Diagnosis not present

## 2016-10-31 DIAGNOSIS — M542 Cervicalgia: Secondary | ICD-10-CM | POA: Diagnosis not present

## 2016-10-31 DIAGNOSIS — M6281 Muscle weakness (generalized): Secondary | ICD-10-CM | POA: Diagnosis not present

## 2016-11-01 DIAGNOSIS — M542 Cervicalgia: Secondary | ICD-10-CM | POA: Diagnosis not present

## 2016-11-01 DIAGNOSIS — M6281 Muscle weakness (generalized): Secondary | ICD-10-CM | POA: Diagnosis not present

## 2016-11-02 ENCOUNTER — Encounter: Payer: Self-pay | Admitting: Internal Medicine

## 2016-11-02 ENCOUNTER — Ambulatory Visit (INDEPENDENT_AMBULATORY_CARE_PROVIDER_SITE_OTHER): Payer: Medicare Other | Admitting: Internal Medicine

## 2016-11-02 DIAGNOSIS — I442 Atrioventricular block, complete: Secondary | ICD-10-CM | POA: Diagnosis not present

## 2016-11-02 LAB — CUP PACEART INCLINIC DEVICE CHECK
Battery Voltage: 2.96 V
Date Time Interrogation Session: 20180824100701
Implantable Lead Implant Date: 20140703
Implantable Lead Location: 753860
Lead Channel Impedance Value: 450 Ohm
Lead Channel Impedance Value: 450 Ohm
Lead Channel Pacing Threshold Amplitude: 0.75 V
Lead Channel Pacing Threshold Amplitude: 0.75 V
Lead Channel Pacing Threshold Pulse Width: 0.4 ms
Lead Channel Pacing Threshold Pulse Width: 0.4 ms
Lead Channel Setting Pacing Amplitude: 0.875
Lead Channel Setting Pacing Amplitude: 2 V
Lead Channel Setting Pacing Pulse Width: 0.4 ms
Lead Channel Setting Sensing Sensitivity: 12.5 mV
MDC IDC LEAD IMPLANT DT: 20140703
MDC IDC LEAD LOCATION: 753859
MDC IDC MSMT BATTERY REMAINING LONGEVITY: 120 mo
MDC IDC MSMT LEADCHNL RA PACING THRESHOLD AMPLITUDE: 0.75 V
MDC IDC MSMT LEADCHNL RA PACING THRESHOLD AMPLITUDE: 0.75 V
MDC IDC MSMT LEADCHNL RA PACING THRESHOLD PULSEWIDTH: 0.4 ms
MDC IDC MSMT LEADCHNL RA SENSING INTR AMPL: 3 mV
MDC IDC MSMT LEADCHNL RV PACING THRESHOLD PULSEWIDTH: 0.4 ms
MDC IDC PG IMPLANT DT: 20140703
MDC IDC PG SERIAL: 7522008
MDC IDC STAT BRADY RA PERCENT PACED: 0.29 %
MDC IDC STAT BRADY RV PERCENT PACED: 99.94 %
Pulse Gen Model: 2240

## 2016-11-02 NOTE — Progress Notes (Signed)
HPI Wendy Velasquez returns today for follow-up of symptomatic tachybradycardia syndrome status post pacemaker insertion.. She is a very pleasant 83 year old woman who underwent pacemaker insertion approximately 4 years ago. Her medical therapy was uptitrated. In the interim, she is been under increased stress, as her husband of almost 30 years has developed dementia. No chest pain, no shortness of breath. 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 She also has some muscular pain as well. Her symptoms do not occur with exertion. No Known Allergies   Current Outpatient Prescriptions  Medication Sig Dispense Refill  . acetaminophen (TYLENOL) 500 MG tablet Take 1,000 mg by mouth every 6 (six) hours as needed for fever.    Marland Kitchen albuterol (PROVENTIL) (2.5 MG/3ML) 0.083% nebulizer solution Take 2.5 mg by nebulization every 6 (six) hours as needed for wheezing or shortness of breath.    Marland Kitchen albuterol-ipratropium (COMBIVENT) 18-103 MCG/ACT inhaler Inhale 2 puffs into the lungs every 6 (six) hours as needed for wheezing.    Marland Kitchen aspirin EC 81 MG tablet Take 81 mg by mouth daily.    . Brinzolamide-Brimonidine (SIMBRINZA) 1-0.2 % SUSP Apply 1 drop to eye at bedtime.    . cetirizine (ZYRTEC) 10 MG tablet Take 10 mg by mouth daily.    . ciprofloxacin (CIPRO) 250 MG tablet Take 250 mg by mouth 2 (two) times daily.  0  . Esomeprazole Magnesium (NEXIUM PO) Take 1 tablet by mouth daily.    . fluticasone (FLONASE) 50 MCG/ACT nasal spray Place 2 sprays into both nostrils as needed. SHAKE AS DIRECTED     . Fluticasone-Salmeterol (ADVAIR DISKUS) 250-50 MCG/DOSE AEPB Inhale 1 puff into the lungs 2 (two) times daily.    Marland Kitchen ibandronate (BONIVA) 150 MG tablet Take 150 mg by mouth every 30 (thirty) days. Take in the morning with a full glass of water, on an empty stomach, and do not take anything else by mouth or lie down for the next 30 min.    . insulin glargine (LANTUS) 100 UNIT/ML injection Inject 12  Units into the skin at bedtime. 9 pm daily    . metFORMIN (GLUCOPHAGE) 1000 MG tablet Take 1,000 mg by mouth 2 (two) times daily with a meal.    . sertraline (ZOLOFT) 50 MG tablet Take 50 mg by mouth 2 (two) times daily.    Marland Kitchen trimethoprim (TRIMPEX) 100 MG tablet Take 1 tablet (100 mg total) by mouth daily. OK TO RESUME AFTER YOU FINISH CURRENT ANTIBIOTIC THERAPY.    . vitamin B-12 (CYANOCOBALAMIN) 1000 MCG tablet Take 1,000 mcg by mouth daily.     No current facility-administered medications for this visit.      Past Medical History:  Diagnosis Date  . Asthma   . Cardiac pacemaker in situ   . Complete heart block ([place])   . COPD (chronic obstructive pulmonary disease) ([place])   . Diverticulosis of colon   . GERD (gastroesophageal reflux disease)   . Glaucoma of both eyes   . History of cardiac arrest    09-11-2012--  symptomatic complete heart block -- hr 20's--  cpr, intubated and temp. pacer until  perm. pacemaker placement  . History of esophageal dilatation    for stricture 2007  . History of hiatal hernia   . History of iron deficiency anemia    hx transfusion's 15 yrs ago , approx 2001  . Hyperlipidemia   . Hypertension   . Peripheral neuropathy   . Tachy-brady syndrome (  [place])   . Type 2 diabetes mellitus ([place])   . Urgency incontinence   . Wears glasses     ROS:   All systems reviewed and negative except as noted in the HPI.   Past Surgical History:  Procedure Laterality Date  . ABDOMINAL HYSTERECTOMY  1976  approx   w/ unilateral salpingoophorectomy  . CATARACT EXTRACTION W/ INTRAOCULAR LENS  IMPLANT, BILATERAL  1990  . COLONOSCOPY WITH ESOPHAGOGASTRODUODENOSCOPY (EGD)  last one 2007  . INTERSTIM IMPLANT PLACEMENT  2012  . INTERSTIM IMPLANT REMOVAL N/A 09/21/2014   Procedure: REMOVAL OF INTERSTIM IMPLANT;  Surgeon: Bjorn Loser, MD;  Location: [facility];  Service: Urology;  Laterality: N/A;  . INTERSTIM IMPLANT REVISION N/A 09/21/2014    Procedure: Barrie Lyme STAGE ONE AND TWO;  Surgeon: Bjorn Loser, MD;  Location: [facility];  Service: Urology;  Laterality: N/A;  . LAPAROTOMY W/ UNILATERAL SALPINGOOPHORECTOMY  1960's  approx  . PERMANENT PACEMAKER INSERTION N/A 09/11/2012   Procedure: PERMANENT PACEMAKER INSERTION;  Surgeon: Evans Lance, MD;  Location: [facility] CATH LAB;  Service: Cardiovascular;  Laterality: N/A;  St. Jude Dual Chamber  . RECTOCELE REPAIR  1991  approx     Family History  Problem Relation Age of Onset  . Prostatitis Father   . Heart failure Sister   . Breast cancer Sister   . Diabetic kidney disease Sister   . Melanoma Brother      Social History   Social History  . Marital status: Married    Spouse name: N/A  . Number of children: N/A  . Years of education: N/A   Occupational History  . Not on file.   Social History Main Topics  . Smoking status: Never Smoker  . Smokeless tobacco: Never Used  . Alcohol use No  . Drug use: No  . Sexual activity: Not on file   Other Topics Concern  . Not on file   Social History Narrative  . No narrative on file     BP (!) 142/82   Pulse 82   Ht 5\' 4"  (1.626 m)   Wt 139 lb 6.4 oz (63.2 kg)   SpO2 98%   BMI 23.93 kg/m   Physical Exam:  Well appearing Elderly woman, NAD HEENT: Unremarkable Neck:  6 cm JVD, no thyromegally Lymphatics:  No adenopathy Back:  No CVA tenderness Lungs:  Clear, with no wheezes, rales, or rhonchi. HEART:  Regular rate rhythm, no murmurs, no rubs, no clicks Abd:  soft, positive bowel sounds, no organomegally, no rebound, no guarding Ext:  2 plus pulses, no edema, no cyanosis, no clubbing Skin:  No rashes no nodules Neuro:  CN II through XII intact, motor grossly intact   DEVICE  Normal device function.  See PaceArt for details.   Assess/Plan: 1. Complete heart block - she is asymptomatic, status post pacemaker insertion. 2. St. Jude dual-chamber pacemaker - her device was interrogated  today. She has over 8 years of battery longevity. She is completely dependent. Her device is working normally. 3. Musculoskeletal pain - she is pending orthopedic evaluation. She will continue Tylenol. She is encouraged to increase her physical activity as tolerated.  Cristopher Peru, M.D.

## 2016-11-02 NOTE — Patient Instructions (Signed)

## 2016-11-05 DIAGNOSIS — M542 Cervicalgia: Secondary | ICD-10-CM | POA: Diagnosis not present

## 2016-11-05 DIAGNOSIS — M6281 Muscle weakness (generalized): Secondary | ICD-10-CM | POA: Diagnosis not present

## 2016-11-06 DIAGNOSIS — F3341 Major depressive disorder, recurrent, in partial remission: Secondary | ICD-10-CM | POA: Diagnosis not present

## 2016-11-06 DIAGNOSIS — M50322 Other cervical disc degeneration at C5-C6 level: Secondary | ICD-10-CM | POA: Diagnosis not present

## 2016-11-06 DIAGNOSIS — M542 Cervicalgia: Secondary | ICD-10-CM | POA: Diagnosis not present

## 2016-11-08 DIAGNOSIS — M6281 Muscle weakness (generalized): Secondary | ICD-10-CM | POA: Diagnosis not present

## 2016-11-08 DIAGNOSIS — M542 Cervicalgia: Secondary | ICD-10-CM | POA: Diagnosis not present

## 2016-11-09 DIAGNOSIS — M6281 Muscle weakness (generalized): Secondary | ICD-10-CM | POA: Diagnosis not present

## 2016-11-09 DIAGNOSIS — M542 Cervicalgia: Secondary | ICD-10-CM | POA: Diagnosis not present

## 2016-11-10 DIAGNOSIS — M542 Cervicalgia: Secondary | ICD-10-CM | POA: Diagnosis not present

## 2016-11-10 DIAGNOSIS — M6281 Muscle weakness (generalized): Secondary | ICD-10-CM | POA: Diagnosis not present

## 2016-11-13 DIAGNOSIS — M6281 Muscle weakness (generalized): Secondary | ICD-10-CM | POA: Diagnosis not present

## 2016-11-13 DIAGNOSIS — M542 Cervicalgia: Secondary | ICD-10-CM | POA: Diagnosis not present

## 2016-11-14 DIAGNOSIS — M542 Cervicalgia: Secondary | ICD-10-CM | POA: Diagnosis not present

## 2016-11-14 DIAGNOSIS — M6281 Muscle weakness (generalized): Secondary | ICD-10-CM | POA: Diagnosis not present

## 2016-11-16 DIAGNOSIS — M6281 Muscle weakness (generalized): Secondary | ICD-10-CM | POA: Diagnosis not present

## 2016-11-16 DIAGNOSIS — M542 Cervicalgia: Secondary | ICD-10-CM | POA: Diagnosis not present

## 2016-11-16 LAB — CUP PACEART REMOTE DEVICE CHECK
Battery Voltage: 2.98 V
Brady Statistic AS VS Percent: 1 %
Brady Statistic RA Percent Paced: 1 %
Date Time Interrogation Session: 20180731062012
Implantable Lead Implant Date: 20140703
Implantable Lead Location: 753859
Implantable Pulse Generator Implant Date: 20140703
Lead Channel Impedance Value: 450 Ohm
Lead Channel Pacing Threshold Amplitude: 0.5 V
Lead Channel Pacing Threshold Pulse Width: 0.4 ms
Lead Channel Pacing Threshold Pulse Width: 0.4 ms
Lead Channel Setting Pacing Amplitude: 2 V
Lead Channel Setting Sensing Sensitivity: 12.5 mV
MDC IDC LEAD IMPLANT DT: 20140703
MDC IDC LEAD LOCATION: 753860
MDC IDC MSMT BATTERY REMAINING LONGEVITY: 126 mo
MDC IDC MSMT BATTERY REMAINING PERCENTAGE: 95.5 %
MDC IDC MSMT LEADCHNL RA IMPEDANCE VALUE: 450 Ohm
MDC IDC MSMT LEADCHNL RA SENSING INTR AMPL: 2 mV
MDC IDC MSMT LEADCHNL RV PACING THRESHOLD AMPLITUDE: 0.625 V
MDC IDC MSMT LEADCHNL RV SENSING INTR AMPL: 12 mV
MDC IDC SET LEADCHNL RV PACING AMPLITUDE: 0.875
MDC IDC SET LEADCHNL RV PACING PULSEWIDTH: 0.4 ms
MDC IDC STAT BRADY AP VP PERCENT: 1 %
MDC IDC STAT BRADY AP VS PERCENT: 1 %
MDC IDC STAT BRADY AS VP PERCENT: 99 %
MDC IDC STAT BRADY RV PERCENT PACED: 99 %
Pulse Gen Model: 2240
Pulse Gen Serial Number: 7522008

## 2016-11-23 DIAGNOSIS — M5033 Other cervical disc degeneration, cervicothoracic region: Secondary | ICD-10-CM | POA: Diagnosis not present

## 2016-11-23 DIAGNOSIS — M542 Cervicalgia: Secondary | ICD-10-CM | POA: Diagnosis not present

## 2016-11-23 DIAGNOSIS — M5412 Radiculopathy, cervical region: Secondary | ICD-10-CM | POA: Diagnosis not present

## 2016-11-23 DIAGNOSIS — M5010 Cervical disc disorder with radiculopathy, unspecified cervical region: Secondary | ICD-10-CM | POA: Diagnosis not present

## 2016-11-28 DIAGNOSIS — M542 Cervicalgia: Secondary | ICD-10-CM | POA: Diagnosis not present

## 2016-11-28 DIAGNOSIS — M6281 Muscle weakness (generalized): Secondary | ICD-10-CM | POA: Diagnosis not present

## 2016-11-29 DIAGNOSIS — M542 Cervicalgia: Secondary | ICD-10-CM | POA: Diagnosis not present

## 2016-11-29 DIAGNOSIS — M6281 Muscle weakness (generalized): Secondary | ICD-10-CM | POA: Diagnosis not present

## 2016-11-30 DIAGNOSIS — M6281 Muscle weakness (generalized): Secondary | ICD-10-CM | POA: Diagnosis not present

## 2016-11-30 DIAGNOSIS — M542 Cervicalgia: Secondary | ICD-10-CM | POA: Diagnosis not present

## 2016-12-03 DIAGNOSIS — M6281 Muscle weakness (generalized): Secondary | ICD-10-CM | POA: Diagnosis not present

## 2016-12-03 DIAGNOSIS — M542 Cervicalgia: Secondary | ICD-10-CM | POA: Diagnosis not present

## 2016-12-05 DIAGNOSIS — M542 Cervicalgia: Secondary | ICD-10-CM | POA: Diagnosis not present

## 2016-12-05 DIAGNOSIS — M6281 Muscle weakness (generalized): Secondary | ICD-10-CM | POA: Diagnosis not present

## 2016-12-07 DIAGNOSIS — M542 Cervicalgia: Secondary | ICD-10-CM | POA: Diagnosis not present

## 2016-12-07 DIAGNOSIS — M6281 Muscle weakness (generalized): Secondary | ICD-10-CM | POA: Diagnosis not present

## 2016-12-10 DIAGNOSIS — M542 Cervicalgia: Secondary | ICD-10-CM | POA: Diagnosis not present

## 2016-12-10 DIAGNOSIS — M6281 Muscle weakness (generalized): Secondary | ICD-10-CM | POA: Diagnosis not present

## 2016-12-13 DIAGNOSIS — M542 Cervicalgia: Secondary | ICD-10-CM | POA: Diagnosis not present

## 2016-12-13 DIAGNOSIS — M6281 Muscle weakness (generalized): Secondary | ICD-10-CM | POA: Diagnosis not present

## 2016-12-17 DIAGNOSIS — M6281 Muscle weakness (generalized): Secondary | ICD-10-CM | POA: Diagnosis not present

## 2016-12-17 DIAGNOSIS — M542 Cervicalgia: Secondary | ICD-10-CM | POA: Diagnosis not present

## 2016-12-20 DIAGNOSIS — M6281 Muscle weakness (generalized): Secondary | ICD-10-CM | POA: Diagnosis not present

## 2016-12-20 DIAGNOSIS — M542 Cervicalgia: Secondary | ICD-10-CM | POA: Diagnosis not present

## 2016-12-24 DIAGNOSIS — M50322 Other cervical disc degeneration at C5-C6 level: Secondary | ICD-10-CM | POA: Diagnosis not present

## 2016-12-24 DIAGNOSIS — M542 Cervicalgia: Secondary | ICD-10-CM | POA: Diagnosis not present

## 2016-12-24 DIAGNOSIS — M6281 Muscle weakness (generalized): Secondary | ICD-10-CM | POA: Diagnosis not present

## 2016-12-27 DIAGNOSIS — M542 Cervicalgia: Secondary | ICD-10-CM | POA: Diagnosis not present

## 2016-12-27 DIAGNOSIS — M6281 Muscle weakness (generalized): Secondary | ICD-10-CM | POA: Diagnosis not present

## 2016-12-28 DIAGNOSIS — M542 Cervicalgia: Secondary | ICD-10-CM | POA: Diagnosis not present

## 2016-12-28 DIAGNOSIS — M6281 Muscle weakness (generalized): Secondary | ICD-10-CM | POA: Diagnosis not present

## 2016-12-31 DIAGNOSIS — M542 Cervicalgia: Secondary | ICD-10-CM | POA: Diagnosis not present

## 2016-12-31 DIAGNOSIS — M6281 Muscle weakness (generalized): Secondary | ICD-10-CM | POA: Diagnosis not present

## 2017-01-01 DIAGNOSIS — M542 Cervicalgia: Secondary | ICD-10-CM | POA: Diagnosis not present

## 2017-01-01 DIAGNOSIS — M6281 Muscle weakness (generalized): Secondary | ICD-10-CM | POA: Diagnosis not present

## 2017-01-04 DIAGNOSIS — M6281 Muscle weakness (generalized): Secondary | ICD-10-CM | POA: Diagnosis not present

## 2017-01-04 DIAGNOSIS — M542 Cervicalgia: Secondary | ICD-10-CM | POA: Diagnosis not present

## 2017-01-07 DIAGNOSIS — M542 Cervicalgia: Secondary | ICD-10-CM | POA: Diagnosis not present

## 2017-01-07 DIAGNOSIS — M6281 Muscle weakness (generalized): Secondary | ICD-10-CM | POA: Diagnosis not present

## 2017-01-08 ENCOUNTER — Ambulatory Visit (INDEPENDENT_AMBULATORY_CARE_PROVIDER_SITE_OTHER): Payer: Medicare Other | Admitting: *Deleted

## 2017-01-08 DIAGNOSIS — M6281 Muscle weakness (generalized): Secondary | ICD-10-CM | POA: Diagnosis not present

## 2017-01-08 DIAGNOSIS — M542 Cervicalgia: Secondary | ICD-10-CM | POA: Diagnosis not present

## 2017-01-08 DIAGNOSIS — I442 Atrioventricular block, complete: Secondary | ICD-10-CM

## 2017-01-10 NOTE — Progress Notes (Signed)
Remote pacemaker transmission.   

## 2017-01-11 DIAGNOSIS — M542 Cervicalgia: Secondary | ICD-10-CM | POA: Diagnosis not present

## 2017-01-11 DIAGNOSIS — M6281 Muscle weakness (generalized): Secondary | ICD-10-CM | POA: Diagnosis not present

## 2017-01-14 DIAGNOSIS — M542 Cervicalgia: Secondary | ICD-10-CM | POA: Diagnosis not present

## 2017-01-14 DIAGNOSIS — M6281 Muscle weakness (generalized): Secondary | ICD-10-CM | POA: Diagnosis not present

## 2017-01-15 DIAGNOSIS — H401132 Primary open-angle glaucoma, bilateral, moderate stage: Secondary | ICD-10-CM | POA: Diagnosis not present

## 2017-01-16 ENCOUNTER — Encounter: Payer: Self-pay | Admitting: Cardiology

## 2017-01-18 DIAGNOSIS — M6281 Muscle weakness (generalized): Secondary | ICD-10-CM | POA: Diagnosis not present

## 2017-01-18 DIAGNOSIS — M542 Cervicalgia: Secondary | ICD-10-CM | POA: Diagnosis not present

## 2017-01-22 DIAGNOSIS — M542 Cervicalgia: Secondary | ICD-10-CM | POA: Diagnosis not present

## 2017-01-22 DIAGNOSIS — M6281 Muscle weakness (generalized): Secondary | ICD-10-CM | POA: Diagnosis not present

## 2017-01-23 DIAGNOSIS — M542 Cervicalgia: Secondary | ICD-10-CM | POA: Diagnosis not present

## 2017-01-23 DIAGNOSIS — M6281 Muscle weakness (generalized): Secondary | ICD-10-CM | POA: Diagnosis not present

## 2017-01-25 DIAGNOSIS — M6281 Muscle weakness (generalized): Secondary | ICD-10-CM | POA: Diagnosis not present

## 2017-01-25 DIAGNOSIS — M542 Cervicalgia: Secondary | ICD-10-CM | POA: Diagnosis not present

## 2017-01-28 DIAGNOSIS — R300 Dysuria: Secondary | ICD-10-CM | POA: Diagnosis not present

## 2017-01-28 DIAGNOSIS — N390 Urinary tract infection, site not specified: Secondary | ICD-10-CM | POA: Diagnosis not present

## 2017-01-28 LAB — CUP PACEART REMOTE DEVICE CHECK
Battery Remaining Longevity: 121 mo
Battery Remaining Percentage: 95.5 %
Battery Voltage: 2.96 V
Brady Statistic AP VP Percent: 1 %
Brady Statistic AP VS Percent: 0 %
Brady Statistic AS VP Percent: 99 %
Brady Statistic AS VS Percent: 1 %
Brady Statistic RA Percent Paced: 1 %
Brady Statistic RV Percent Paced: 99 %
Date Time Interrogation Session: 20181030210028
Implantable Lead Implant Date: 20140703
Implantable Lead Implant Date: 20140703
Implantable Lead Location: 753859
Implantable Lead Location: 753860
Implantable Pulse Generator Implant Date: 20140703
Lead Channel Impedance Value: 450 Ohm
Lead Channel Impedance Value: 460 Ohm
Lead Channel Pacing Threshold Amplitude: 0.625 V
Lead Channel Pacing Threshold Amplitude: 0.75 V
Lead Channel Pacing Threshold Pulse Width: 0.4 ms
Lead Channel Pacing Threshold Pulse Width: 0.4 ms
Lead Channel Sensing Intrinsic Amplitude: 12 mV
Lead Channel Sensing Intrinsic Amplitude: 2.1 mV
Lead Channel Setting Pacing Amplitude: 0.875
Lead Channel Setting Pacing Amplitude: 2 V
Lead Channel Setting Pacing Pulse Width: 0.4 ms
Lead Channel Setting Sensing Sensitivity: 12.5 mV
Pulse Gen Model: 2240
Pulse Gen Serial Number: 7522008

## 2017-01-30 DIAGNOSIS — M542 Cervicalgia: Secondary | ICD-10-CM | POA: Diagnosis not present

## 2017-01-30 DIAGNOSIS — M6281 Muscle weakness (generalized): Secondary | ICD-10-CM | POA: Diagnosis not present

## 2017-02-04 DIAGNOSIS — N3941 Urge incontinence: Secondary | ICD-10-CM | POA: Diagnosis not present

## 2017-02-11 DIAGNOSIS — M542 Cervicalgia: Secondary | ICD-10-CM | POA: Diagnosis not present

## 2017-02-11 DIAGNOSIS — M6281 Muscle weakness (generalized): Secondary | ICD-10-CM | POA: Diagnosis not present

## 2017-02-13 DIAGNOSIS — M542 Cervicalgia: Secondary | ICD-10-CM | POA: Diagnosis not present

## 2017-02-13 DIAGNOSIS — M6281 Muscle weakness (generalized): Secondary | ICD-10-CM | POA: Diagnosis not present

## 2017-02-14 DIAGNOSIS — N312 Flaccid neuropathic bladder, not elsewhere classified: Secondary | ICD-10-CM | POA: Diagnosis not present

## 2017-02-14 DIAGNOSIS — N3946 Mixed incontinence: Secondary | ICD-10-CM | POA: Diagnosis not present

## 2017-02-14 DIAGNOSIS — N3942 Incontinence without sensory awareness: Secondary | ICD-10-CM | POA: Diagnosis not present

## 2017-02-14 DIAGNOSIS — R350 Frequency of micturition: Secondary | ICD-10-CM | POA: Diagnosis not present

## 2017-02-18 DIAGNOSIS — Z23 Encounter for immunization: Secondary | ICD-10-CM | POA: Diagnosis not present

## 2017-02-28 DIAGNOSIS — R3914 Feeling of incomplete bladder emptying: Secondary | ICD-10-CM | POA: Diagnosis not present

## 2017-02-28 DIAGNOSIS — N3020 Other chronic cystitis without hematuria: Secondary | ICD-10-CM | POA: Diagnosis not present

## 2017-02-28 DIAGNOSIS — N3946 Mixed incontinence: Secondary | ICD-10-CM | POA: Diagnosis not present

## 2017-03-14 DIAGNOSIS — I129 Hypertensive chronic kidney disease with stage 1 through stage 4 chronic kidney disease, or unspecified chronic kidney disease: Secondary | ICD-10-CM | POA: Diagnosis not present

## 2017-03-14 DIAGNOSIS — M859 Disorder of bone density and structure, unspecified: Secondary | ICD-10-CM | POA: Diagnosis not present

## 2017-03-14 DIAGNOSIS — E538 Deficiency of other specified B group vitamins: Secondary | ICD-10-CM | POA: Diagnosis not present

## 2017-03-14 DIAGNOSIS — E1129 Type 2 diabetes mellitus with other diabetic kidney complication: Secondary | ICD-10-CM | POA: Diagnosis not present

## 2017-03-14 DIAGNOSIS — E559 Vitamin D deficiency, unspecified: Secondary | ICD-10-CM | POA: Diagnosis not present

## 2017-03-14 DIAGNOSIS — F3341 Major depressive disorder, recurrent, in partial remission: Secondary | ICD-10-CM | POA: Diagnosis not present

## 2017-03-14 DIAGNOSIS — E7800 Pure hypercholesterolemia, unspecified: Secondary | ICD-10-CM | POA: Diagnosis not present

## 2017-03-14 DIAGNOSIS — Z6823 Body mass index (BMI) 23.0-23.9, adult: Secondary | ICD-10-CM | POA: Diagnosis not present

## 2017-03-14 DIAGNOSIS — N183 Chronic kidney disease, stage 3 (moderate): Secondary | ICD-10-CM | POA: Diagnosis not present

## 2017-03-14 DIAGNOSIS — J4550 Severe persistent asthma, uncomplicated: Secondary | ICD-10-CM | POA: Diagnosis not present

## 2017-03-14 DIAGNOSIS — N3281 Overactive bladder: Secondary | ICD-10-CM | POA: Diagnosis not present

## 2017-03-14 DIAGNOSIS — D692 Other nonthrombocytopenic purpura: Secondary | ICD-10-CM | POA: Diagnosis not present

## 2017-03-29 DIAGNOSIS — M4802 Spinal stenosis, cervical region: Secondary | ICD-10-CM | POA: Diagnosis not present

## 2017-03-29 DIAGNOSIS — M542 Cervicalgia: Secondary | ICD-10-CM | POA: Diagnosis not present

## 2017-04-05 DIAGNOSIS — M4802 Spinal stenosis, cervical region: Secondary | ICD-10-CM | POA: Diagnosis not present

## 2017-04-08 DIAGNOSIS — N390 Urinary tract infection, site not specified: Secondary | ICD-10-CM | POA: Diagnosis not present

## 2017-04-08 DIAGNOSIS — Z6823 Body mass index (BMI) 23.0-23.9, adult: Secondary | ICD-10-CM | POA: Diagnosis not present

## 2017-04-08 DIAGNOSIS — R32 Unspecified urinary incontinence: Secondary | ICD-10-CM | POA: Diagnosis not present

## 2017-04-08 DIAGNOSIS — T148XXA Other injury of unspecified body region, initial encounter: Secondary | ICD-10-CM | POA: Diagnosis not present

## 2017-04-09 ENCOUNTER — Ambulatory Visit: Payer: Medicare Other | Admitting: *Deleted

## 2017-04-09 ENCOUNTER — Telehealth: Payer: Self-pay | Admitting: Cardiology

## 2017-04-09 NOTE — Telephone Encounter (Signed)
LMOVM reminding pt to send remote transmission.   

## 2017-04-10 NOTE — Progress Notes (Signed)
Not received  

## 2017-04-12 ENCOUNTER — Encounter: Payer: Self-pay | Admitting: Cardiology

## 2017-04-17 DIAGNOSIS — H401132 Primary open-angle glaucoma, bilateral, moderate stage: Secondary | ICD-10-CM | POA: Diagnosis not present

## 2017-04-22 DIAGNOSIS — M6281 Muscle weakness (generalized): Secondary | ICD-10-CM | POA: Diagnosis not present

## 2017-04-22 DIAGNOSIS — M5481 Occipital neuralgia: Secondary | ICD-10-CM | POA: Diagnosis not present

## 2017-04-22 DIAGNOSIS — M4802 Spinal stenosis, cervical region: Secondary | ICD-10-CM | POA: Diagnosis not present

## 2017-04-23 DIAGNOSIS — M4802 Spinal stenosis, cervical region: Secondary | ICD-10-CM | POA: Diagnosis not present

## 2017-04-23 DIAGNOSIS — M5481 Occipital neuralgia: Secondary | ICD-10-CM | POA: Diagnosis not present

## 2017-04-23 DIAGNOSIS — M6281 Muscle weakness (generalized): Secondary | ICD-10-CM | POA: Diagnosis not present

## 2017-04-24 DIAGNOSIS — M5481 Occipital neuralgia: Secondary | ICD-10-CM | POA: Diagnosis not present

## 2017-04-24 DIAGNOSIS — M6281 Muscle weakness (generalized): Secondary | ICD-10-CM | POA: Diagnosis not present

## 2017-04-24 DIAGNOSIS — M4802 Spinal stenosis, cervical region: Secondary | ICD-10-CM | POA: Diagnosis not present

## 2017-04-24 IMAGING — CR DG CHEST 2V
2 series · 2 of 2 positions shown · non-contrast
Comparison: PA and lateral chest x-ray September 28, 2012

CLINICAL DATA: Nonproductive cough for the past 4 weeks without
fever, history of asthma, COPD, cardiac dysrhythmia requiring
pacemaker placement.

EXAM:
CHEST  2 VIEW

[w chest pa]
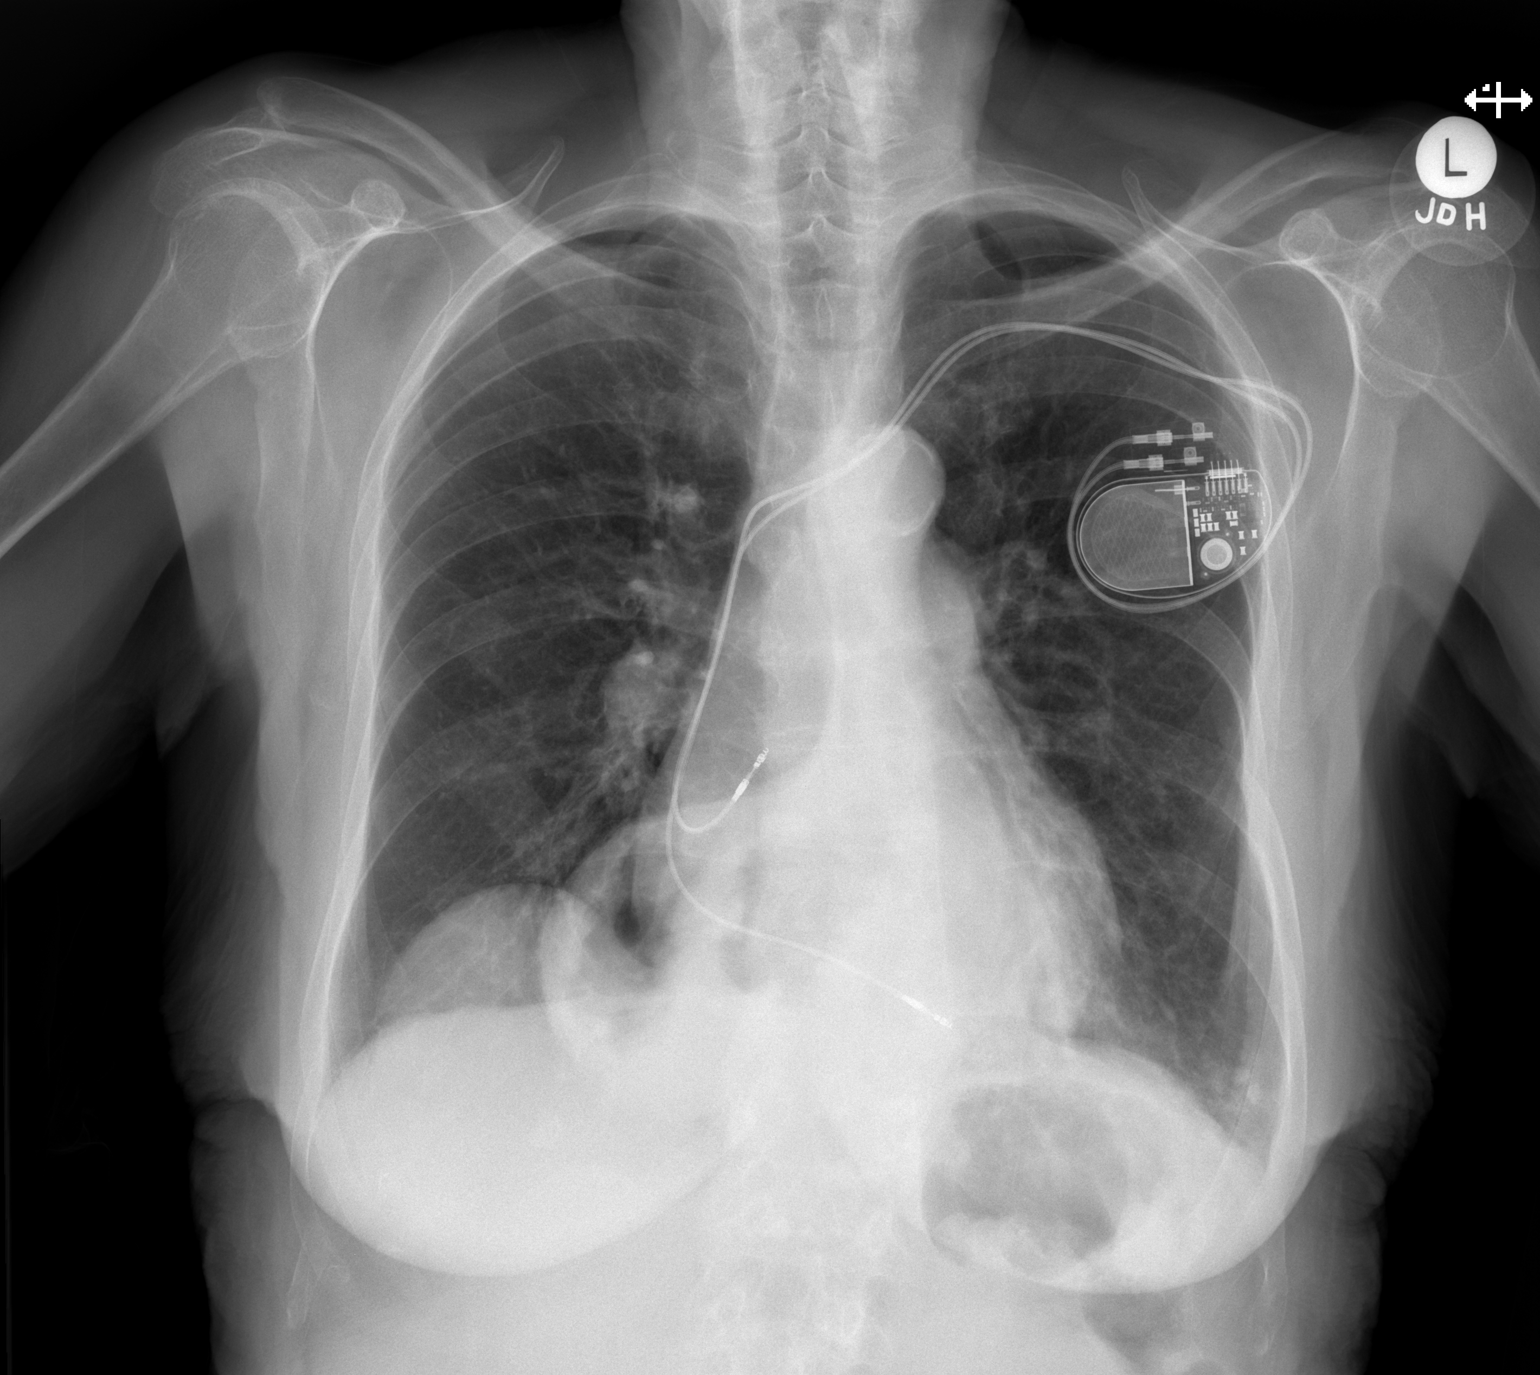

[w chest lat]
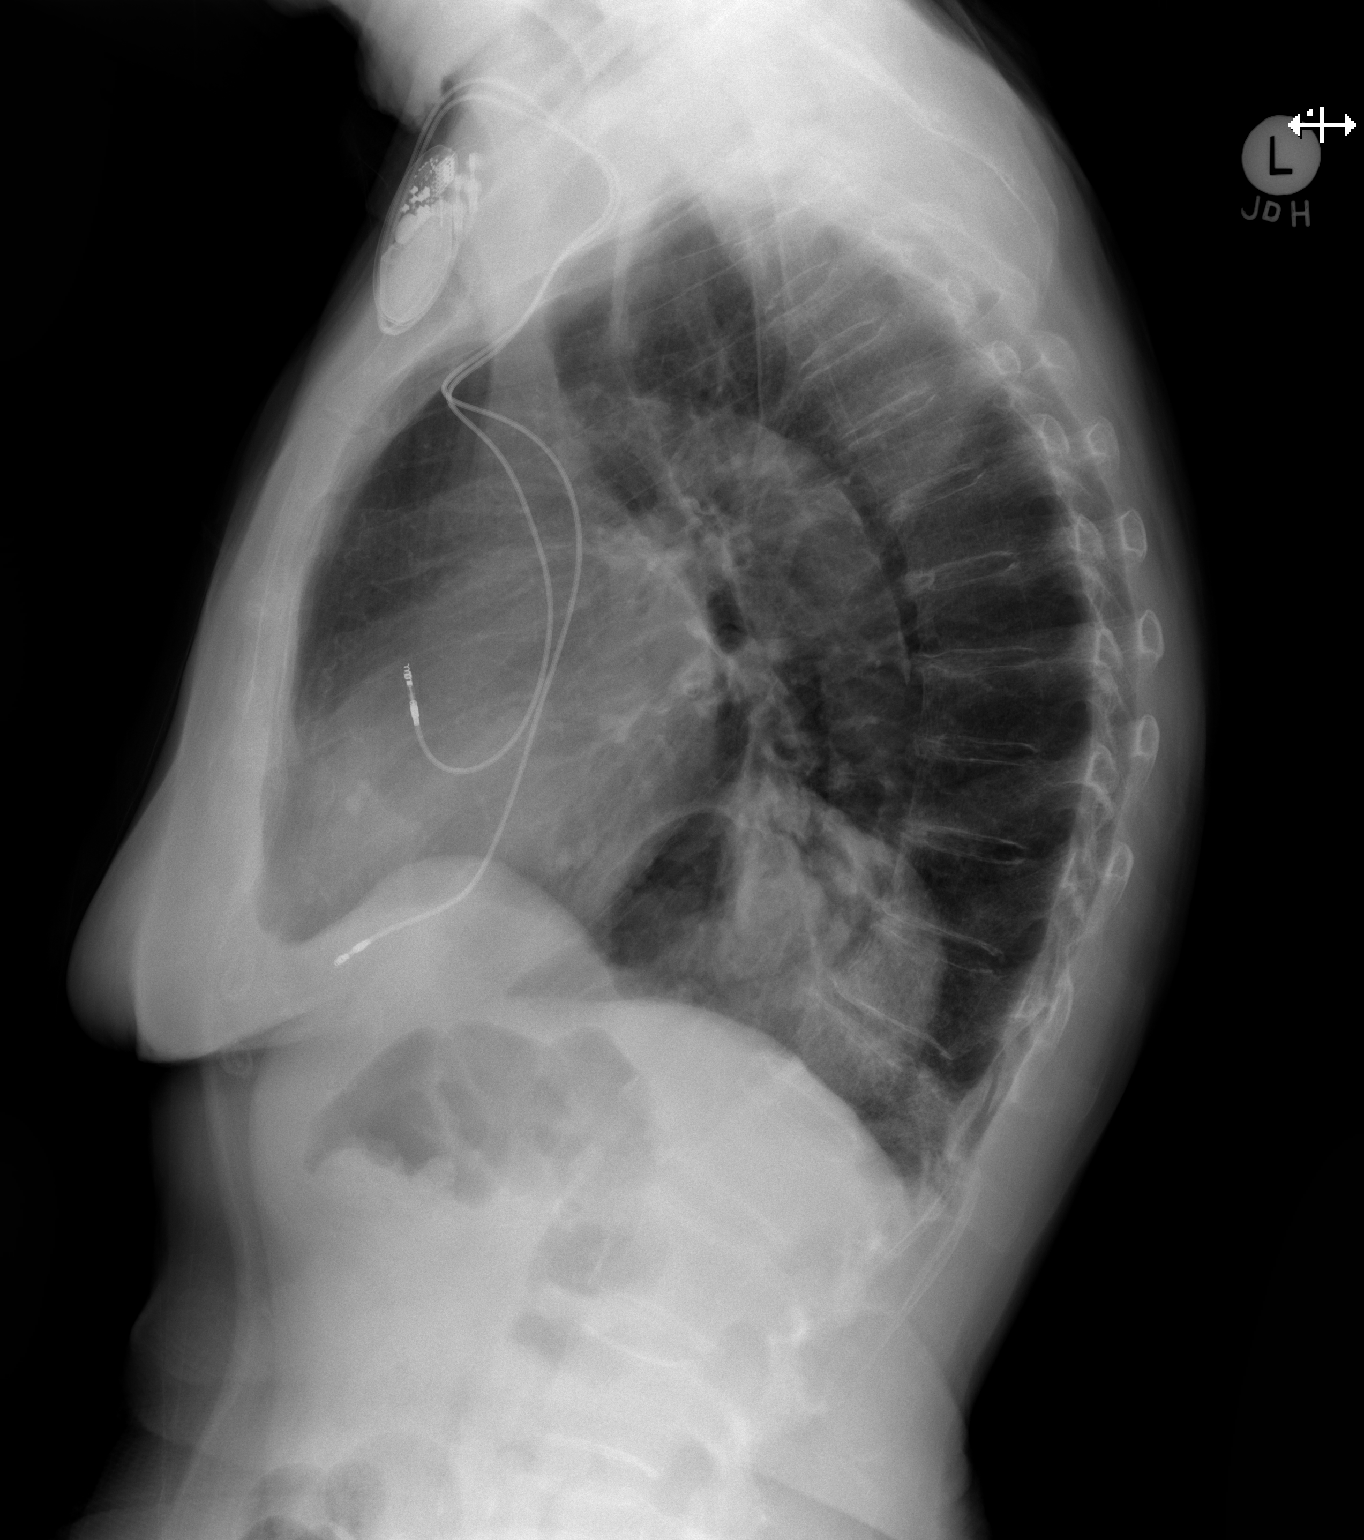

[2 of 2 positions shown; findings below may reference images not displayed]

FINDINGS: The lungs are well-expanded. There is a large hiatal hernia
-partially intra thoracic stomach. The cardiac silhouette is normal
in size. The pulmonary vascularity is not engorged. There is no
pleural effusion or alveolar infiltrate. The permanent pacemaker is
in reasonable position radiographically. There is stable gentle
levocurvature of the thoracolumbar junction.
IMPRESSION: COPD a chronic bronchitic change. There is no evidence of pneumonia,
CHF, nor other acute cardiopulmonary abnormality.

## 2017-04-25 ENCOUNTER — Telehealth: Payer: Self-pay | Admitting: Internal Medicine

## 2017-04-25 DIAGNOSIS — N3946 Mixed incontinence: Secondary | ICD-10-CM | POA: Diagnosis not present

## 2017-04-25 DIAGNOSIS — R350 Frequency of micturition: Secondary | ICD-10-CM | POA: Diagnosis not present

## 2017-04-25 DIAGNOSIS — N3020 Other chronic cystitis without hematuria: Secondary | ICD-10-CM | POA: Diagnosis not present

## 2017-04-25 NOTE — Telephone Encounter (Signed)
Confirmed with daughter that we successfully received remote transmission.

## 2017-04-25 NOTE — Telephone Encounter (Signed)
New Message   1. Has your device fired? no  2. Is you device beeping? no  3. Are you experiencing draining or swelling at device site? no  4. Are you calling to see if we received your device transmission? Pt daughter says that she thought the transmission was automatic and wants to know what she has to do  5. Have you passed out? no    Please route to [place]

## 2017-04-26 DIAGNOSIS — M4802 Spinal stenosis, cervical region: Secondary | ICD-10-CM | POA: Diagnosis not present

## 2017-04-26 DIAGNOSIS — M6281 Muscle weakness (generalized): Secondary | ICD-10-CM | POA: Diagnosis not present

## 2017-04-26 DIAGNOSIS — M5481 Occipital neuralgia: Secondary | ICD-10-CM | POA: Diagnosis not present

## 2017-04-29 DIAGNOSIS — M4802 Spinal stenosis, cervical region: Secondary | ICD-10-CM | POA: Diagnosis not present

## 2017-04-29 DIAGNOSIS — M6281 Muscle weakness (generalized): Secondary | ICD-10-CM | POA: Diagnosis not present

## 2017-04-29 DIAGNOSIS — M5481 Occipital neuralgia: Secondary | ICD-10-CM | POA: Diagnosis not present

## 2017-05-01 DIAGNOSIS — M5481 Occipital neuralgia: Secondary | ICD-10-CM | POA: Diagnosis not present

## 2017-05-01 DIAGNOSIS — M6281 Muscle weakness (generalized): Secondary | ICD-10-CM | POA: Diagnosis not present

## 2017-05-01 DIAGNOSIS — M4802 Spinal stenosis, cervical region: Secondary | ICD-10-CM | POA: Diagnosis not present

## 2017-05-02 DIAGNOSIS — M6281 Muscle weakness (generalized): Secondary | ICD-10-CM | POA: Diagnosis not present

## 2017-05-02 DIAGNOSIS — M5481 Occipital neuralgia: Secondary | ICD-10-CM | POA: Diagnosis not present

## 2017-05-02 DIAGNOSIS — M4802 Spinal stenosis, cervical region: Secondary | ICD-10-CM | POA: Diagnosis not present

## 2017-05-06 DIAGNOSIS — M6281 Muscle weakness (generalized): Secondary | ICD-10-CM | POA: Diagnosis not present

## 2017-05-06 DIAGNOSIS — M5481 Occipital neuralgia: Secondary | ICD-10-CM | POA: Diagnosis not present

## 2017-05-06 DIAGNOSIS — M4802 Spinal stenosis, cervical region: Secondary | ICD-10-CM | POA: Diagnosis not present

## 2017-05-10 DIAGNOSIS — M5481 Occipital neuralgia: Secondary | ICD-10-CM | POA: Diagnosis not present

## 2017-05-10 DIAGNOSIS — M4802 Spinal stenosis, cervical region: Secondary | ICD-10-CM | POA: Diagnosis not present

## 2017-05-10 DIAGNOSIS — M6281 Muscle weakness (generalized): Secondary | ICD-10-CM | POA: Diagnosis not present

## 2017-05-13 ENCOUNTER — Encounter: Payer: Medicare Other | Admitting: *Deleted

## 2017-05-13 ENCOUNTER — Telehealth: Payer: Self-pay | Admitting: Cardiology

## 2017-05-13 DIAGNOSIS — M6281 Muscle weakness (generalized): Secondary | ICD-10-CM | POA: Diagnosis not present

## 2017-05-13 DIAGNOSIS — M5481 Occipital neuralgia: Secondary | ICD-10-CM | POA: Diagnosis not present

## 2017-05-13 DIAGNOSIS — M4802 Spinal stenosis, cervical region: Secondary | ICD-10-CM | POA: Diagnosis not present

## 2017-05-13 NOTE — Telephone Encounter (Signed)
LMOVM reminding pt to send remote transmission.   

## 2017-05-15 DIAGNOSIS — M542 Cervicalgia: Secondary | ICD-10-CM | POA: Diagnosis not present

## 2017-05-15 DIAGNOSIS — M4802 Spinal stenosis, cervical region: Secondary | ICD-10-CM | POA: Diagnosis not present

## 2017-05-16 ENCOUNTER — Encounter: Payer: Self-pay | Admitting: Cardiology

## 2017-05-20 DIAGNOSIS — M6281 Muscle weakness (generalized): Secondary | ICD-10-CM | POA: Diagnosis not present

## 2017-05-20 DIAGNOSIS — M5481 Occipital neuralgia: Secondary | ICD-10-CM | POA: Diagnosis not present

## 2017-05-20 DIAGNOSIS — M4802 Spinal stenosis, cervical region: Secondary | ICD-10-CM | POA: Diagnosis not present

## 2017-05-21 DIAGNOSIS — M6281 Muscle weakness (generalized): Secondary | ICD-10-CM | POA: Diagnosis not present

## 2017-05-21 DIAGNOSIS — M5481 Occipital neuralgia: Secondary | ICD-10-CM | POA: Diagnosis not present

## 2017-05-21 DIAGNOSIS — M4802 Spinal stenosis, cervical region: Secondary | ICD-10-CM | POA: Diagnosis not present

## 2017-05-27 DIAGNOSIS — M6281 Muscle weakness (generalized): Secondary | ICD-10-CM | POA: Diagnosis not present

## 2017-05-27 DIAGNOSIS — M5481 Occipital neuralgia: Secondary | ICD-10-CM | POA: Diagnosis not present

## 2017-05-27 DIAGNOSIS — M4802 Spinal stenosis, cervical region: Secondary | ICD-10-CM | POA: Diagnosis not present

## 2017-05-30 DIAGNOSIS — M5481 Occipital neuralgia: Secondary | ICD-10-CM | POA: Diagnosis not present

## 2017-05-30 DIAGNOSIS — M4802 Spinal stenosis, cervical region: Secondary | ICD-10-CM | POA: Diagnosis not present

## 2017-05-30 DIAGNOSIS — M6281 Muscle weakness (generalized): Secondary | ICD-10-CM | POA: Diagnosis not present

## 2017-06-03 DIAGNOSIS — M5481 Occipital neuralgia: Secondary | ICD-10-CM | POA: Diagnosis not present

## 2017-06-03 DIAGNOSIS — M6281 Muscle weakness (generalized): Secondary | ICD-10-CM | POA: Diagnosis not present

## 2017-06-03 DIAGNOSIS — M4802 Spinal stenosis, cervical region: Secondary | ICD-10-CM | POA: Diagnosis not present

## 2017-06-04 DIAGNOSIS — M6281 Muscle weakness (generalized): Secondary | ICD-10-CM | POA: Diagnosis not present

## 2017-06-04 DIAGNOSIS — M4802 Spinal stenosis, cervical region: Secondary | ICD-10-CM | POA: Diagnosis not present

## 2017-06-04 DIAGNOSIS — M5481 Occipital neuralgia: Secondary | ICD-10-CM | POA: Diagnosis not present

## 2017-06-07 DIAGNOSIS — M5481 Occipital neuralgia: Secondary | ICD-10-CM | POA: Diagnosis not present

## 2017-06-07 DIAGNOSIS — M6281 Muscle weakness (generalized): Secondary | ICD-10-CM | POA: Diagnosis not present

## 2017-06-07 DIAGNOSIS — M4802 Spinal stenosis, cervical region: Secondary | ICD-10-CM | POA: Diagnosis not present

## 2017-06-10 DIAGNOSIS — M5481 Occipital neuralgia: Secondary | ICD-10-CM | POA: Diagnosis not present

## 2017-06-10 DIAGNOSIS — M6281 Muscle weakness (generalized): Secondary | ICD-10-CM | POA: Diagnosis not present

## 2017-06-10 DIAGNOSIS — M4802 Spinal stenosis, cervical region: Secondary | ICD-10-CM | POA: Diagnosis not present

## 2017-06-11 DIAGNOSIS — M5481 Occipital neuralgia: Secondary | ICD-10-CM | POA: Diagnosis not present

## 2017-06-11 DIAGNOSIS — M4802 Spinal stenosis, cervical region: Secondary | ICD-10-CM | POA: Diagnosis not present

## 2017-06-11 DIAGNOSIS — M6281 Muscle weakness (generalized): Secondary | ICD-10-CM | POA: Diagnosis not present

## 2017-06-17 DIAGNOSIS — M791 Myalgia, unspecified site: Secondary | ICD-10-CM | POA: Diagnosis not present

## 2017-06-17 DIAGNOSIS — M542 Cervicalgia: Secondary | ICD-10-CM | POA: Diagnosis not present

## 2017-06-25 DIAGNOSIS — N390 Urinary tract infection, site not specified: Secondary | ICD-10-CM | POA: Diagnosis not present

## 2017-06-25 DIAGNOSIS — R300 Dysuria: Secondary | ICD-10-CM | POA: Diagnosis not present

## 2017-07-02 DIAGNOSIS — R197 Diarrhea, unspecified: Secondary | ICD-10-CM | POA: Diagnosis not present

## 2017-07-15 DIAGNOSIS — M4802 Spinal stenosis, cervical region: Secondary | ICD-10-CM | POA: Diagnosis not present

## 2017-07-24 DIAGNOSIS — H401132 Primary open-angle glaucoma, bilateral, moderate stage: Secondary | ICD-10-CM | POA: Diagnosis not present

## 2017-07-24 DIAGNOSIS — H3530 Unspecified macular degeneration: Secondary | ICD-10-CM | POA: Diagnosis not present

## 2017-07-24 DIAGNOSIS — H52223 Regular astigmatism, bilateral: Secondary | ICD-10-CM | POA: Diagnosis not present

## 2017-07-24 DIAGNOSIS — Z9849 Cataract extraction status, unspecified eye: Secondary | ICD-10-CM | POA: Diagnosis not present

## 2017-07-24 DIAGNOSIS — H5212 Myopia, left eye: Secondary | ICD-10-CM | POA: Diagnosis not present

## 2017-07-24 DIAGNOSIS — Z961 Presence of intraocular lens: Secondary | ICD-10-CM | POA: Diagnosis not present

## 2017-07-24 DIAGNOSIS — H353131 Nonexudative age-related macular degeneration, bilateral, early dry stage: Secondary | ICD-10-CM | POA: Diagnosis not present

## 2017-07-24 DIAGNOSIS — H524 Presbyopia: Secondary | ICD-10-CM | POA: Diagnosis not present

## 2017-07-24 DIAGNOSIS — E119 Type 2 diabetes mellitus without complications: Secondary | ICD-10-CM | POA: Diagnosis not present

## 2017-08-12 ENCOUNTER — Ambulatory Visit (INDEPENDENT_AMBULATORY_CARE_PROVIDER_SITE_OTHER): Payer: Medicare Other | Admitting: *Deleted

## 2017-08-12 DIAGNOSIS — M542 Cervicalgia: Secondary | ICD-10-CM | POA: Diagnosis not present

## 2017-08-12 DIAGNOSIS — R3914 Feeling of incomplete bladder emptying: Secondary | ICD-10-CM | POA: Diagnosis not present

## 2017-08-12 DIAGNOSIS — I442 Atrioventricular block, complete: Secondary | ICD-10-CM

## 2017-08-16 ENCOUNTER — Encounter: Payer: Self-pay | Admitting: Cardiology

## 2017-08-16 NOTE — Progress Notes (Signed)
Remote pacemaker transmission.   

## 2017-08-22 DIAGNOSIS — N3946 Mixed incontinence: Secondary | ICD-10-CM | POA: Diagnosis not present

## 2017-08-22 DIAGNOSIS — N3942 Incontinence without sensory awareness: Secondary | ICD-10-CM | POA: Diagnosis not present

## 2017-08-22 DIAGNOSIS — R350 Frequency of micturition: Secondary | ICD-10-CM | POA: Diagnosis not present

## 2017-08-29 LAB — CUP PACEART REMOTE DEVICE CHECK
Battery Remaining Longevity: 120 mo
Brady Statistic AP VS Percent: 1 %
Brady Statistic AS VP Percent: 99 %
Brady Statistic RV Percent Paced: 99 %
Implantable Lead Implant Date: 20140703
Lead Channel Impedance Value: 450 Ohm
Lead Channel Pacing Threshold Amplitude: 0.625 V
Lead Channel Sensing Intrinsic Amplitude: 12 mV
Lead Channel Setting Pacing Amplitude: 2 V
Lead Channel Setting Pacing Pulse Width: 0.4 ms
MDC IDC LEAD IMPLANT DT: 20140703
MDC IDC LEAD LOCATION: 753859
MDC IDC LEAD LOCATION: 753860
MDC IDC MSMT BATTERY REMAINING PERCENTAGE: 95.5 %
MDC IDC MSMT BATTERY VOLTAGE: 2.96 V
MDC IDC MSMT LEADCHNL RA PACING THRESHOLD AMPLITUDE: 0.75 V
MDC IDC MSMT LEADCHNL RA PACING THRESHOLD PULSEWIDTH: 0.4 ms
MDC IDC MSMT LEADCHNL RA SENSING INTR AMPL: 1.5 mV
MDC IDC MSMT LEADCHNL RV IMPEDANCE VALUE: 450 Ohm
MDC IDC MSMT LEADCHNL RV PACING THRESHOLD PULSEWIDTH: 0.4 ms
MDC IDC PG IMPLANT DT: 20140703
MDC IDC PG SERIAL: 7522008
MDC IDC SESS DTM: 20190603064023
MDC IDC SET LEADCHNL RV PACING AMPLITUDE: 0.875
MDC IDC SET LEADCHNL RV SENSING SENSITIVITY: 12.5 mV
MDC IDC STAT BRADY AP VP PERCENT: 1 %
MDC IDC STAT BRADY AS VS PERCENT: 1 %
MDC IDC STAT BRADY RA PERCENT PACED: 1 %

## 2017-09-05 DIAGNOSIS — R3914 Feeling of incomplete bladder emptying: Secondary | ICD-10-CM | POA: Diagnosis not present

## 2017-09-05 DIAGNOSIS — N312 Flaccid neuropathic bladder, not elsewhere classified: Secondary | ICD-10-CM | POA: Diagnosis not present

## 2017-09-05 DIAGNOSIS — N3020 Other chronic cystitis without hematuria: Secondary | ICD-10-CM | POA: Diagnosis not present

## 2017-09-05 DIAGNOSIS — N3946 Mixed incontinence: Secondary | ICD-10-CM | POA: Diagnosis not present

## 2017-09-09 DIAGNOSIS — M4802 Spinal stenosis, cervical region: Secondary | ICD-10-CM | POA: Diagnosis not present

## 2017-09-09 DIAGNOSIS — M542 Cervicalgia: Secondary | ICD-10-CM | POA: Diagnosis not present

## 2017-09-11 DIAGNOSIS — F3341 Major depressive disorder, recurrent, in partial remission: Secondary | ICD-10-CM | POA: Diagnosis not present

## 2017-09-11 DIAGNOSIS — Z6822 Body mass index (BMI) 22.0-22.9, adult: Secondary | ICD-10-CM | POA: Diagnosis not present

## 2017-09-11 DIAGNOSIS — E559 Vitamin D deficiency, unspecified: Secondary | ICD-10-CM | POA: Diagnosis not present

## 2017-09-11 DIAGNOSIS — E538 Deficiency of other specified B group vitamins: Secondary | ICD-10-CM | POA: Diagnosis not present

## 2017-09-11 DIAGNOSIS — N3281 Overactive bladder: Secondary | ICD-10-CM | POA: Diagnosis not present

## 2017-09-11 DIAGNOSIS — E7800 Pure hypercholesterolemia, unspecified: Secondary | ICD-10-CM | POA: Diagnosis not present

## 2017-09-11 DIAGNOSIS — D508 Other iron deficiency anemias: Secondary | ICD-10-CM | POA: Diagnosis not present

## 2017-09-11 DIAGNOSIS — J441 Chronic obstructive pulmonary disease with (acute) exacerbation: Secondary | ICD-10-CM | POA: Diagnosis not present

## 2017-09-11 DIAGNOSIS — N183 Chronic kidney disease, stage 3 (moderate): Secondary | ICD-10-CM | POA: Diagnosis not present

## 2017-09-11 DIAGNOSIS — E1129 Type 2 diabetes mellitus with other diabetic kidney complication: Secondary | ICD-10-CM | POA: Diagnosis not present

## 2017-09-11 DIAGNOSIS — D692 Other nonthrombocytopenic purpura: Secondary | ICD-10-CM | POA: Diagnosis not present

## 2017-09-11 DIAGNOSIS — I129 Hypertensive chronic kidney disease with stage 1 through stage 4 chronic kidney disease, or unspecified chronic kidney disease: Secondary | ICD-10-CM | POA: Diagnosis not present

## 2017-10-10 DIAGNOSIS — R51 Headache: Secondary | ICD-10-CM | POA: Diagnosis not present

## 2017-10-10 DIAGNOSIS — M4802 Spinal stenosis, cervical region: Secondary | ICD-10-CM | POA: Diagnosis not present

## 2017-10-10 DIAGNOSIS — M791 Myalgia, unspecified site: Secondary | ICD-10-CM | POA: Diagnosis not present

## 2017-10-17 DIAGNOSIS — R8271 Bacteriuria: Secondary | ICD-10-CM | POA: Diagnosis not present

## 2017-10-17 DIAGNOSIS — N312 Flaccid neuropathic bladder, not elsewhere classified: Secondary | ICD-10-CM | POA: Diagnosis not present

## 2017-10-17 DIAGNOSIS — N3942 Incontinence without sensory awareness: Secondary | ICD-10-CM | POA: Diagnosis not present

## 2017-10-17 DIAGNOSIS — R3914 Feeling of incomplete bladder emptying: Secondary | ICD-10-CM | POA: Diagnosis not present

## 2017-11-12 ENCOUNTER — Ambulatory Visit (INDEPENDENT_AMBULATORY_CARE_PROVIDER_SITE_OTHER): Payer: Medicare Other | Admitting: *Deleted

## 2017-11-12 DIAGNOSIS — I442 Atrioventricular block, complete: Secondary | ICD-10-CM

## 2017-11-12 NOTE — Progress Notes (Signed)
Remote pacemaker transmission.   

## 2017-11-20 DIAGNOSIS — S81819A Laceration without foreign body, unspecified lower leg, initial encounter: Secondary | ICD-10-CM | POA: Diagnosis not present

## 2017-11-20 DIAGNOSIS — Z6822 Body mass index (BMI) 22.0-22.9, adult: Secondary | ICD-10-CM | POA: Diagnosis not present

## 2017-11-20 DIAGNOSIS — Z23 Encounter for immunization: Secondary | ICD-10-CM | POA: Diagnosis not present

## 2017-11-27 DIAGNOSIS — H401132 Primary open-angle glaucoma, bilateral, moderate stage: Secondary | ICD-10-CM | POA: Diagnosis not present

## 2017-11-28 DIAGNOSIS — Z6822 Body mass index (BMI) 22.0-22.9, adult: Secondary | ICD-10-CM | POA: Diagnosis not present

## 2017-11-28 DIAGNOSIS — S81809S Unspecified open wound, unspecified lower leg, sequela: Secondary | ICD-10-CM | POA: Diagnosis not present

## 2017-11-28 DIAGNOSIS — R600 Localized edema: Secondary | ICD-10-CM | POA: Diagnosis not present

## 2017-11-28 DIAGNOSIS — R300 Dysuria: Secondary | ICD-10-CM | POA: Diagnosis not present

## 2017-12-05 ENCOUNTER — Telehealth: Payer: Self-pay | Admitting: Neurology

## 2017-12-05 ENCOUNTER — Encounter: Payer: Self-pay | Admitting: Neurology

## 2017-12-05 ENCOUNTER — Ambulatory Visit (INDEPENDENT_AMBULATORY_CARE_PROVIDER_SITE_OTHER): Payer: Medicare Other | Admitting: Neurology

## 2017-12-05 VITALS — BP 151/76 | HR 79 | Ht 64.0 in | Wt 128.5 lb

## 2017-12-05 DIAGNOSIS — M542 Cervicalgia: Secondary | ICD-10-CM

## 2017-12-05 DIAGNOSIS — G8929 Other chronic pain: Secondary | ICD-10-CM | POA: Diagnosis not present

## 2017-12-05 MED ORDER — GABAPENTIN 100 MG PO CAPS: 100.0000 mg | ORAL_CAPSULE | Freq: Three times a day (TID) | ORAL | 11 refills | Status: DC

## 2017-12-05 NOTE — Progress Notes (Signed)
PATIENT: Wendy Velasquez DOB: 24-Aug-1925  Chief Complaint  Patient presents with  . Neck/Head Pain    She is here with her daughter, Wendy Velasquez, to discuss treatment options for her neck pain.  PT was helpful during the time she was there but she did not have lasting effects.  She does wear a neck brace at times.  Ice also seems to be beneficial.  She has failed improvement with repeat steroid injections.  She is unable to have MRI due to her pacemaker.  Marland Kitchen PCP    Tisovec, Fransico Him, MD  . Orthopaedic    Mayo, Darla Lesches, PA-C - referring provider     HISTORICAL  Wendy Velasquez is a 84 years old female, seen in request by her primary care physician Dr. Osborne Casco, Fransico Him for evaluation of neck pain, headaches, initial evaluation was on December 05, 2017.  She is accompanied by her daughter at today's clinical visit.  I have reviewed and summarized the referring note from the referring physician, she has past medical history of diabetes, use insulin, history of cardiac block, status post pacemaker, hyperlipidemia,  She had a history of chronic neck pain, had acute worsening since March 2018, she woke up one morning, noticed upper neck throbbing pain, radiating pain to her skull, has been persistent since then, over the last 1 year, she was seen by orthopedic PA, Ronette Deter, was treated with multiple trigger point injection, with mixture of 1% lidocaine, and Depo-Medrol, most recent injection was on October 10, 2017, on a monthly basis, patient denies significant improvement, also complains of worsening glucose control after injection,  She denies significant radiating pain to bilateral shoulder upper extremity, denies difficulty using arms, she had chronic urinary incontinence is receiving Botox injection by urologist, she contributed to her gait abnormality to her bilateral knee pain, chronic arthritis,  She now complains of difficulty turning her neck, 6 out of 10 constant deep achy  pain, multiple tender spots at cervical, upper trapezius, oftentimes she has transient sharp radiating range of motion, difficulty turning her neck,  She also tried physical therapy, is doing morning neck traction regularly,  REVIEW OF SYSTEMS: Full 14 system review of systems performed and notable only for headache All other review of systems were negative.  ALLERGIES: No Known Allergies  HOME MEDICATIONS: Current Outpatient Medications  Medication Sig Dispense Refill  . acetaminophen (TYLENOL) 500 MG tablet Take 1,000 mg by mouth every 6 (six) hours as needed for fever.    Marland Kitchen albuterol (PROVENTIL) (2.5 MG/3ML) 0.083% nebulizer solution Take 2.5 mg by nebulization every 6 (six) hours as needed for wheezing or shortness of breath.    Marland Kitchen albuterol-ipratropium (COMBIVENT) 18-103 MCG/ACT inhaler Inhale 2 puffs into the lungs every 6 (six) hours as needed for wheezing.    Marland Kitchen aspirin EC 81 MG tablet Take 81 mg by mouth daily.    . Brinzolamide-Brimonidine (SIMBRINZA) 1-0.2 % SUSP Apply 1 drop to eye at bedtime.    . cetirizine (ZYRTEC) 10 MG tablet Take 10 mg by mouth daily.    . Esomeprazole Magnesium (NEXIUM PO) Take 1 tablet by mouth daily.    . fluticasone (FLONASE) 50 MCG/ACT nasal spray Place 2 sprays into both nostrils as needed. SHAKE AS DIRECTED     . Fluticasone-Salmeterol (ADVAIR DISKUS) 250-50 MCG/DOSE AEPB Inhale 1 puff into the lungs 2 (two) times daily.    Marland Kitchen ibandronate (BONIVA) 150 MG tablet Take 150 mg by mouth every 30 (thirty) days. Take  in the morning with a full glass of water, on an empty stomach, and do not take anything else by mouth or lie down for the next 30 min.    . insulin glargine (LANTUS) 100 UNIT/ML injection Inject 12 Units into the skin at bedtime. 9 pm daily    . metFORMIN (GLUCOPHAGE) 1000 MG tablet Take 1,000 mg by mouth 2 (two) times daily with a meal.    . sertraline (ZOLOFT) 50 MG tablet Take 50 mg by mouth 2 (two) times daily.    Marland Kitchen trimethoprim (TRIMPEX)  100 MG tablet Take 1 tablet (100 mg total) by mouth daily. OK TO RESUME AFTER YOU FINISH CURRENT ANTIBIOTIC THERAPY.    . vitamin B-12 (CYANOCOBALAMIN) 1000 MCG tablet Take 1,000 mcg by mouth daily.     No current facility-administered medications for this visit.     PAST MEDICAL HISTORY: Past Medical History:  Diagnosis Date  . Asthma   . Cardiac pacemaker in situ   . Complete heart block ([place])   . COPD (chronic obstructive pulmonary disease) ([place])   . Diverticulosis of colon   . GERD (gastroesophageal reflux disease)   . Glaucoma of both eyes   . Heart disease   . History of cardiac arrest    09-11-2012--  symptomatic complete heart block -- hr 20's--  cpr, intubated and temp. pacer until  perm. pacemaker placement  . History of esophageal dilatation    for stricture 2007  . History of hiatal hernia   . History of iron deficiency anemia    hx transfusion's 15 yrs ago , approx 2001  . Hyperlipidemia   . Hypertension   . Neck pain   . Peripheral neuropathy   . Tachy-brady syndrome ([place])   . Type 2 diabetes mellitus ([place])   . Urgency incontinence    Botox injections for bladder.  . Wears glasses     PAST SURGICAL HISTORY: Past Surgical History:  Procedure Laterality Date  . ABDOMINAL HYSTERECTOMY  1976  approx   w/ unilateral salpingoophorectomy  . CATARACT EXTRACTION W/ INTRAOCULAR LENS  IMPLANT, BILATERAL  1990  . COLONOSCOPY WITH ESOPHAGOGASTRODUODENOSCOPY (EGD)  last one 2007  . INTERSTIM IMPLANT PLACEMENT  2012  . INTERSTIM IMPLANT REMOVAL N/A 09/21/2014   Procedure: REMOVAL OF INTERSTIM IMPLANT;  Surgeon: Bjorn Loser, MD;  Location[facility];  Service: Urology;  Laterality: N/A;  . INTERSTIM IMPLANT REVISION N/A 09/21/2014   Procedure: Barrie Lyme STAGE ONE AND TWO;  Surgeon: Bjorn Loser, MD;  Location: [facility];  Service: Urology;  Laterality: N/A;  . LAPAROTOMY W/ UNILATERAL SALPINGOOPHORECTOMY  1960's  approx  .  PERMANENT PACEMAKER INSERTION N/A 09/11/2012   Procedure: PERMANENT PACEMAKER INSERTION;  Surgeon: Evans Lance, MD;  Location: [facility] CATH LAB;  Service: Cardiovascular;  Laterality: N/A;  St. Jude Dual Chamber  . RECTOCELE REPAIR  1991  approx    FAMILY HISTORY: Family History  Problem Relation Age of Onset  . Other Mother        died during her childbirth  . Prostatitis Father   . Heart failure Sister   . Breast cancer Sister   . Diabetic kidney disease Sister   . Melanoma Brother     SOCIAL HISTORY: Social History   Socioeconomic History  . Marital status: Married    Spouse name: Not on file  . Number of children: 3  . Years of education: 32  . Highest education level: High school graduate  Occupational History  .  Occupation: Retired  Scientific laboratory technician  . Financial resource strain: Not on file  . Food insecurity:    Worry: Not on file    Inability: Not on file  . Transportation needs:    Medical: Not on file    Non-medical: Not on file  Tobacco Use  . Smoking status: Never Smoker  . Smokeless tobacco: Never Used  Substance and Sexual Activity  . Alcohol use: No  . Drug use: No  . Sexual activity: Not on file  Lifestyle  . Physical activity:    Days per week: Not on file    Minutes per session: Not on file  . Stress: Not on file  Relationships  . Social connections:    Talks on phone: Not on file    Gets together: Not on file    Attends religious service: Not on file    Active member of club or organization: Not on file    Attends meetings of clubs or organizations: Not on file    Relationship status: Not on file  . Intimate partner violence:    Fear of current or ex partner: Not on file    Emotionally abused: Not on file    Physically abused: Not on file    Forced sexual activity: Not on file  Other Topics Concern  . Not on file  Social History Narrative   Lives with her husband in an independent living facility.   Right-handed.   1-2 cup caffeine daily.       PHYSICAL EXAM   Vitals:   12/05/17 0759  BP: (!) 151/76  Pulse: 79  Weight: 128 lb 8 oz (58.3 kg)  Height: 5\' 4"  (1.626 m)    Not recorded      Body mass index is 22.06 kg/m.  PHYSICAL EXAMNIATION:  Gen: NAD, conversant, well nourised, obese, well groomed                     Cardiovascular: Regular rate rhythm, no peripheral edema, warm, nontender. Eyes: Conjunctivae clear without exudates or hemorrhage Neck: Supple, no carotid bruits. Pulmonary: Clear to auscultation bilaterally   NEUROLOGICAL EXAM:  MENTAL STATUS: Speech:    Speech is normal; fluent and spontaneous with normal comprehension.  Cognition:     Orientation to time, place and person     Normal recent and remote memory     Normal Attention span and concentration     Normal Language, naming, repeating,spontaneous speech     Fund of knowledge   CRANIAL NERVES: CN II: Visual fields are full to confrontation.  Pupils are round equal and briskly reactive to light. CN III, IV, VI: extraocular movement are normal. No ptosis. CN V: Facial sensation is intact to pinprick in all 3 divisions bilaterally. Corneal responses are intact.  CN VII: Face is symmetric with normal eye closure and smile. CN VIII: Hearing is normal to rubbing fingers CN IX, X: Palate elevates symmetrically. Phonation is normal. CN XI: Head turning and shoulder shrug are intact CN XII: Tongue is midline with normal movements and no atrophy.  MOTOR: There is no pronator drift of out-stretched arms. Muscle bulk and tone are normal. Muscle strength is normal.  She has limited range of motion, difficulty turning her neck to the extreme right or left,  REFLEXES: Reflexes are hypoactive and symmetric at the biceps, triceps, knees, and ankles. Plantar responses are flexor.  SENSORY: Intact to light touch, pinprick, positional sensation and vibratory sensation are intact in fingers  and toes.  COORDINATION: Rapid alternating movements and  fine finger movements are intact. There is no dysmetria on finger-to-nose and heel-knee-shin.    GAIT/STANCE: She needs pushed up to get up from seated position, cautious, mildly unsteady   DIAGNOSTIC DATA (LABS, IMAGING, TESTING) - I reviewed patient records, labs, notes, testing and imaging myself where available.   ASSESSMENT AND PLAN  Wendy Velasquez is a 84 y.o. female    Chronic neck pain, headaches  Suggestive of upper cervical degenerative disc disease  CT cervical, not a candidate for MRI due to pacemaker  Continue neck traction, also suggested heating pad, physical therapy,  Gabapentin 100 mg 3 times daily,  Marcial Pacas, M.D. Ph.D.  [facility] [address], [place], [place] [address] Ph: [phone] Fax: [phone]  CC: Tisovec, Fransico Him, MD

## 2017-12-05 NOTE — Telephone Encounter (Signed)
Medicare/BCBS Auth: NPR spoke to [facility] Ref # [id number] u order sent to GI. They will reach out to the pt to schedule.

## 2017-12-09 LAB — CUP PACEART REMOTE DEVICE CHECK
Battery Remaining Longevity: 112 mo
Battery Remaining Percentage: 89 %
Brady Statistic AP VP Percent: 1 %
Brady Statistic AP VS Percent: 1 %
Brady Statistic AS VS Percent: 1 %
Brady Statistic RV Percent Paced: 99 %
Date Time Interrogation Session: 20190903052047
Implantable Lead Implant Date: 20140703
Implantable Lead Location: 753860
Implantable Pulse Generator Implant Date: 20140703
Lead Channel Impedance Value: 480 Ohm
Lead Channel Pacing Threshold Amplitude: 0.625 V
Lead Channel Pacing Threshold Pulse Width: 0.4 ms
Lead Channel Sensing Intrinsic Amplitude: 1.9 mV
Lead Channel Setting Pacing Amplitude: 0.875
Lead Channel Setting Pacing Amplitude: 2 V
Lead Channel Setting Pacing Pulse Width: 0.4 ms
Lead Channel Setting Sensing Sensitivity: 12.5 mV
MDC IDC LEAD IMPLANT DT: 20140703
MDC IDC LEAD LOCATION: 753859
MDC IDC MSMT BATTERY VOLTAGE: 2.95 V
MDC IDC MSMT LEADCHNL RA IMPEDANCE VALUE: 480 Ohm
MDC IDC MSMT LEADCHNL RA PACING THRESHOLD AMPLITUDE: 0.75 V
MDC IDC MSMT LEADCHNL RA PACING THRESHOLD PULSEWIDTH: 0.4 ms
MDC IDC MSMT LEADCHNL RV SENSING INTR AMPL: 12 mV
MDC IDC STAT BRADY AS VP PERCENT: 99 %
MDC IDC STAT BRADY RA PERCENT PACED: 1 %
Pulse Gen Model: 2240
Pulse Gen Serial Number: 7522008

## 2017-12-10 ENCOUNTER — Ambulatory Visit
Admission: RE | Admit: 2017-12-10 | Discharge: 2017-12-10 | Disposition: A | Discharge status: Home / Self Care | Payer: Medicare Other | Source: Ambulatory Visit | Attending: Neurology | Admitting: Neurology

## 2017-12-10 ENCOUNTER — Telehealth: Payer: Self-pay | Admitting: Neurology

## 2017-12-10 DIAGNOSIS — M542 Cervicalgia: Secondary | ICD-10-CM | POA: Diagnosis not present

## 2017-12-10 NOTE — Telephone Encounter (Signed)
Please call patient, CT of cervical spine showed multilevel degenerative changes, maximum at the C4-5, with associated facet arthropathy, will review films with her at next follow up visit.  IMPRESSION: 1. No acute osseous abnormality in the cervical spine. 2. Multilevel spondylolisthesis, maximal at C4-C5, with associated facet arthropathy and at several levels evidence of ankylosed facets or developing ankylosis. 3. Lower cervical disc and endplate degeneration, maximal at C6-C7. Multifactorial mild to moderate spinal stenosis at C5-C6 and C6-C7. Moderate or severe left C6 and bilateral C7 neural foraminal stenosis. 4. Mild spinal stenosis also suspected at the C2-C3 and C3-C4 levels. There is severe left C4 neural foraminal stenosis.

## 2017-12-10 NOTE — Telephone Encounter (Signed)
Spoke to patient's daugher, Pershing Cox (on DPR) - she is aware of the MRI results and verbalized understanding.  They will keep her pending appt for further review with Dr. Krista Blue.

## 2017-12-12 ENCOUNTER — Telehealth: Payer: Self-pay | Admitting: Neurology

## 2017-12-12 DIAGNOSIS — Z23 Encounter for immunization: Secondary | ICD-10-CM | POA: Diagnosis not present

## 2017-12-12 DIAGNOSIS — R3914 Feeling of incomplete bladder emptying: Secondary | ICD-10-CM | POA: Diagnosis not present

## 2017-12-12 MED ORDER — GABAPENTIN 100 MG PO CAPS: ORAL_CAPSULE | ORAL | 11 refills | Status: DC

## 2017-12-12 NOTE — Telephone Encounter (Signed)
Spoke to patient's daughter, Ocie Cornfield (on [place]).  States her mother has increased her gabapentin 100mg  dose.  She is taking 2 capsules in am, 2 capsules at lunch and 1 capsule at bedtime.  The higher dose, along with heat, is working well for her pain.  She is requesting a new prescription for gabapentin reflecting the new schedule, if appropriate.

## 2017-12-12 NOTE — Telephone Encounter (Signed)
Ok, per vo by Dr. Krista Blue, to provide patient with a new prescription reflecting the increased dose of gabapentin.

## 2017-12-12 NOTE — Telephone Encounter (Signed)
Pt daughter(on DPR-[place],Bunny) is asking for a call to discuss gabapentin (NEURONTIN) 100 MG capsule

## 2017-12-12 NOTE — Addendum Note (Signed)
Addended by: Noberto Retort C on: 12/12/2017 01:32 PM   Modules accepted: Orders

## 2017-12-19 ENCOUNTER — Other Ambulatory Visit: Payer: Self-pay | Admitting: *Deleted

## 2017-12-19 NOTE — Telephone Encounter (Signed)
The patient's gabapentin dose was just increased so she is running out of her current prescription early.  I spoke to Kippy at [facility] [phone]) who told me she would contact the patient's insurance company for request an override due to a therapy change.  I called the patient's daughter back to update her with this information.  She will call back if she has any further problems.

## 2017-12-19 NOTE — Telephone Encounter (Signed)
Pts daughter(Bunny) called stating that due to the increase change the insurance company requesting a call stating that the change is ok before approving please advise

## 2017-12-19 NOTE — Telephone Encounter (Signed)
The pharmacy called me back.  They were unable to get an override for the increased dose of gabapentin.  I called [employer] ([phone]) and completed a quantity override.  It was approved through 12/19/2018.  Case [id number].  Pt [phone].  The patient's daughter on DPR (Bunny) and the pharmacy ([facility] on [address]) have been notified of the approval.

## 2018-01-02 ENCOUNTER — Encounter: Payer: Self-pay | Admitting: Neurology

## 2018-01-02 ENCOUNTER — Ambulatory Visit (INDEPENDENT_AMBULATORY_CARE_PROVIDER_SITE_OTHER): Payer: Medicare Other | Admitting: Neurology

## 2018-01-02 VITALS — BP 131/67 | HR 77 | Ht 64.0 in | Wt 132.5 lb

## 2018-01-02 DIAGNOSIS — M542 Cervicalgia: Secondary | ICD-10-CM

## 2018-01-02 DIAGNOSIS — G8929 Other chronic pain: Secondary | ICD-10-CM

## 2018-01-02 DIAGNOSIS — G243 Spasmodic torticollis: Secondary | ICD-10-CM

## 2018-01-02 MED ORDER — GABAPENTIN 300 MG PO CAPS
300.0000 mg | ORAL_CAPSULE | Freq: Three times a day (TID) | ORAL | 11 refills | Status: DC
Start: 2018-01-02 — End: 2018-01-30

## 2018-01-02 NOTE — Progress Notes (Signed)
PATIENT: Wendy Velasquez DOB: 10/13/1925  Chief Complaint  Patient presents with  . Chronic Neck Pain    She is here with her daughter, Wendy Velasquez.  They would like to review her CT cervical results.  She has continued having shooting pains despite taking gabapentin 100mg  taking 2 in am, 2 at midday and 1 at bedtime.     HISTORICAL  Wendy Velasquez is a 84 years old female, seen in request by her primary care physician Dr. Osborne Velasquez, Wendy Velasquez for evaluation of neck pain, headaches, initial evaluation was on December 05, 2017.  She is accompanied by her daughter at today's clinical visit.  I have reviewed and summarized the referring note from the referring physician, she has past medical history of diabetes, use insulin, history of cardiac block, status post pacemaker, hyperlipidemia,  She had a history of chronic neck pain, had acute worsening since March 2018, she woke up one morning, noticed upper neck throbbing pain, radiating pain to her skull, has been persistent since then, over the last 1 year, she was seen by orthopedic PA, Wendy Velasquez, was treated with multiple trigger point injection, with mixture of 1% lidocaine, and Depo-Medrol, most recent injection was on October 10, 2017, on a monthly basis, patient denies significant improvement, also complains of worsening glucose control after injection,  She denies significant radiating pain to bilateral shoulder upper extremity, denies difficulty using arms, she had chronic urinary incontinence is receiving Botox injection by urologist, she contributed to her gait abnormality to her bilateral knee pain, chronic arthritis,  She now complains of difficulty turning her neck, 6 out of 10 constant deep achy pain, multiple tender spots at cervical, upper trapezius, oftentimes she has transient sharp radiating range of motion, difficulty turning her neck,  She also tried physical therapy, is doing morning neck traction regularly,  UPDATE Jan 02 2018: She continue complains of significant neck pain, 8 out of 10 sometimes, radiating pain to bilateral occipital region despite gabapentin 100 mg 5 tablets each day, I have personally reviewed CT cervical spine on December 10, 2017, no acute abnormality, multilevel spondylolisthesis, maximum at C4-5, with associated   facet arthropathy, evidence of ankylosis, mild spinal stenosis at the C2-3, C3-4 level  REVIEW OF SYSTEMS: Full 14 system review of systems performed and notable only for headache All other review of systems were negative.  ALLERGIES: No Known Allergies  HOME MEDICATIONS: Current Outpatient Medications  Medication Sig Dispense Refill  . acetaminophen (TYLENOL) 500 MG tablet Take 1,000 mg by mouth every 6 (six) hours as needed for fever.    Marland Kitchen albuterol (PROVENTIL) (2.5 MG/3ML) 0.083% nebulizer solution Take 2.5 mg by nebulization every 6 (six) hours as needed for wheezing or shortness of breath.    Marland Kitchen albuterol-ipratropium (COMBIVENT) 18-103 MCG/ACT inhaler Inhale 2 puffs into the lungs every 6 (six) hours as needed for wheezing.    Marland Kitchen aspirin EC 81 MG tablet Take 81 mg by mouth daily.    . Brinzolamide-Brimonidine (SIMBRINZA) 1-0.2 % SUSP Apply 1 drop to eye at bedtime.    . cetirizine (ZYRTEC) 10 MG tablet Take 10 mg by mouth daily.    . Esomeprazole Magnesium (NEXIUM PO) Take 1 tablet by mouth daily.    . fluticasone (FLONASE) 50 MCG/ACT nasal spray Place 2 sprays into both nostrils as needed. SHAKE AS DIRECTED     . Fluticasone-Salmeterol (ADVAIR DISKUS) 250-50 MCG/DOSE AEPB Inhale 1 puff into the lungs 2 (two) times daily.    Marland Kitchen  gabapentin (NEURONTIN) 100 MG capsule Take 2 capsules in am, 2 capsules midday, 1 capsule at bedtime. 150 capsule 11  . ibandronate (BONIVA) 150 MG tablet Take 150 mg by mouth every 30 (thirty) days. Take in the morning with a full glass of water, on an empty stomach, and do not take anything else by mouth or lie down for the next 30 min.    . insulin  glargine (LANTUS) 100 UNIT/ML injection Inject 12 Units into the skin at bedtime. 9 pm daily    . metFORMIN (GLUCOPHAGE) 1000 MG tablet Take 1,000 mg by mouth 2 (two) times daily with a meal.    . sertraline (ZOLOFT) 50 MG tablet Take 50 mg by mouth 2 (two) times daily.    Marland Kitchen trimethoprim (TRIMPEX) 100 MG tablet Take 1 tablet (100 mg total) by mouth daily. OK TO RESUME AFTER YOU FINISH CURRENT ANTIBIOTIC THERAPY.    . vitamin B-12 (CYANOCOBALAMIN) 1000 MCG tablet Take 1,000 mcg by mouth daily.     No current facility-administered medications for this visit.     PAST MEDICAL HISTORY: Past Medical History:  Diagnosis Date  . Asthma   . Cardiac pacemaker in situ   . Complete heart block ([place])   . COPD (chronic obstructive pulmonary disease) ([place])   . Diverticulosis of colon   . GERD (gastroesophageal reflux disease)   . Glaucoma of both eyes   . Heart disease   . History of cardiac arrest    09-11-2012--  symptomatic complete heart block -- hr 20's--  cpr, intubated and temp. pacer until  perm. pacemaker placement  . History of esophageal dilatation    for stricture 2007  . History of hiatal hernia   . History of iron deficiency anemia    hx transfusion's 15 yrs ago , approx 2001  . Hyperlipidemia   . Hypertension   . Neck pain   . Peripheral neuropathy   . Tachy-brady syndrome ([place])   . Type 2 diabetes mellitus ([place])   . Urgency incontinence    Botox injections for bladder.  . Wears glasses     PAST SURGICAL HISTORY: Past Surgical History:  Procedure Laterality Date  . ABDOMINAL HYSTERECTOMY  1976  approx   w/ unilateral salpingoophorectomy  . CATARACT EXTRACTION W/ INTRAOCULAR LENS  IMPLANT, BILATERAL  1990  . COLONOSCOPY WITH ESOPHAGOGASTRODUODENOSCOPY (EGD)  last one 2007  . INTERSTIM IMPLANT PLACEMENT  2012  . INTERSTIM IMPLANT REMOVAL N/A 09/21/2014   Procedure: REMOVAL OF INTERSTIM IMPLANT;  Surgeon: Wendy Loser, MD;  [facility];   Service: Urology;  Laterality: N/A;  . INTERSTIM IMPLANT REVISION N/A 09/21/2014   Procedure: Wendy Velasquez STAGE ONE AND TWO;  Surgeon: Wendy Loser, MD;  Location: [facility];  Service: Urology;  Laterality: N/A;  . LAPAROTOMY W/ UNILATERAL SALPINGOOPHORECTOMY  1960's  approx  . PERMANENT PACEMAKER INSERTION N/A 09/11/2012   Procedure: PERMANENT PACEMAKER INSERTION;  Surgeon: Evans Lance, MD;  Location: [facility] CATH LAB;  Service: Cardiovascular;  Laterality: N/A;  St. Jude Dual Chamber  . RECTOCELE REPAIR  1991  approx    FAMILY HISTORY: Family History  Problem Relation Age of Onset  . Other Mother        died during her childbirth  . Prostatitis Father   . Heart failure Sister   . Breast cancer Sister   . Diabetic kidney disease Sister   . Melanoma Brother     SOCIAL HISTORY: Social History   Socioeconomic History  .  Marital status: Married    Spouse name: Not on file  . Number of children: 3  . Years of education: 66  . Highest education level: High school graduate  Occupational History  . Occupation: Retired  Scientific laboratory technician  . Financial resource strain: Not on file  . Food insecurity:    Worry: Not on file    Inability: Not on file  . Transportation needs:    Medical: Not on file    Non-medical: Not on file  Tobacco Use  . Smoking status: Never Smoker  . Smokeless tobacco: Never Used  Substance and Sexual Activity  . Alcohol use: No  . Drug use: No  . Sexual activity: Not on file  Lifestyle  . Physical activity:    Days per week: Not on file    Minutes per session: Not on file  . Stress: Not on file  Relationships  . Social connections:    Talks on phone: Not on file    Gets together: Not on file    Attends religious service: Not on file    Active member of club or organization: Not on file    Attends meetings of clubs or organizations: Not on file    Relationship status: Not on file  . Intimate partner violence:    Fear of current or ex  partner: Not on file    Emotionally abused: Not on file    Physically abused: Not on file    Forced sexual activity: Not on file  Other Topics Concern  . Not on file  Social History Narrative   Lives with her husband in an independent living facility.   Right-handed.   1-2 cup caffeine daily.     PHYSICAL EXAM   Vitals:   01/02/18 0909  BP: 131/67  Pulse: 77  Weight: 132 lb 8 oz (60.1 kg)  Height: 5\' 4"  (1.626 m)    Not recorded      Body mass index is 22.74 kg/m.  PHYSICAL EXAMNIATION:  Gen: NAD, conversant, well nourised, obese, well groomed                     Cardiovascular: Regular rate rhythm, no peripheral edema, warm, nontender. Eyes: Conjunctivae clear without exudates or hemorrhage Neck: Supple, no carotid bruits. Pulmonary: Clear to auscultation bilaterally   NEUROLOGICAL EXAM:  MENTAL STATUS: Speech:    Speech is normal; fluent and spontaneous with normal comprehension.  Cognition:     Orientation to time, place and person     Normal recent and remote memory     Normal Attention span and concentration     Normal Language, naming, repeating,spontaneous speech     Fund of knowledge   CRANIAL NERVES: CN II: Visual fields are full to confrontation.  Pupils are round equal and briskly reactive to light. CN III, IV, VI: extraocular movement are normal. No ptosis. CN V: Facial sensation is intact to pinprick in all 3 divisions bilaterally. Corneal responses are intact.  CN VII: Face is symmetric with normal eye closure and smile. CN VIII: Hearing is normal to rubbing fingers CN IX, X: Palate elevates symmetrically. Phonation is normal. CN XI: Head turning and shoulder shrug are intact CN XII: Tongue is midline with normal movements and no atrophy.  MOTOR: There is no pronator drift of out-stretched arms. Muscle bulk and tone are normal. Muscle strength is normal.  She has limited range of motion, difficulty turning her neck to the extreme right or  left,  REFLEXES: Reflexes are hypoactive and symmetric at the biceps, triceps, knees, and ankles. Plantar responses are flexor.  SENSORY: Intact to light touch, pinprick, positional sensation and vibratory sensation are intact in fingers and toes.  COORDINATION: Rapid alternating movements and fine finger movements are intact. There is no dysmetria on finger-to-nose and heel-knee-shin.    GAIT/STANCE: She needs pushed up to get up from seated position, cautious, mildly unsteady   DIAGNOSTIC DATA (LABS, IMAGING, TESTING) - I reviewed patient records, labs, notes, testing and imaging myself where available.   ASSESSMENT AND PLAN  TALLIE DODDS is a 84 y.o. female    Chronic neck pain, headaches Abnormal neck posturing, mild anterocollis, anterior shift, significant tenderness at bilateral upper cervical paraspinal muscles,  Suggestive of upper cervical degenerative disc disease  CT cervical showed evidence of multilevel spondylolisthesis, maximum at C4-5 with associated facet arthropathy, developing ankylosis  She had failed to respond neck traction, heating pad, trigger point injection, gabapentin,  I performed EMG guided Botox injection Used 100 units of Botox a simple, 6578-4696-29, lot number [id number], expiration date Feb 11 2021  Right longissimus capitis 25 units Right splenius cervix 12.5 Right semispinalis 12.5 units  Left longissimus capitis 25 units Left splenius cervix 12.5 units Left semispinalis 12.5 units  Preauthorization for xeomin 200 units, will only use 100 units at next injection   Wendy Velasquez, M.D. Ph.D.  [facility] [address], [place], [place] [address] Ph: [phone] Fax: [phone]  CC: Tisovec, Wendy Him, MD

## 2018-01-08 DIAGNOSIS — M542 Cervicalgia: Secondary | ICD-10-CM | POA: Diagnosis not present

## 2018-01-14 DIAGNOSIS — M542 Cervicalgia: Secondary | ICD-10-CM | POA: Diagnosis not present

## 2018-01-15 DIAGNOSIS — M542 Cervicalgia: Secondary | ICD-10-CM | POA: Diagnosis not present

## 2018-01-20 ENCOUNTER — Encounter: Payer: Self-pay | Admitting: Internal Medicine

## 2018-01-20 DIAGNOSIS — M542 Cervicalgia: Secondary | ICD-10-CM | POA: Diagnosis not present

## 2018-01-22 DIAGNOSIS — M542 Cervicalgia: Secondary | ICD-10-CM | POA: Diagnosis not present

## 2018-01-23 DIAGNOSIS — M542 Cervicalgia: Secondary | ICD-10-CM | POA: Diagnosis not present

## 2018-01-27 DIAGNOSIS — M542 Cervicalgia: Secondary | ICD-10-CM | POA: Diagnosis not present

## 2018-01-29 DIAGNOSIS — M542 Cervicalgia: Secondary | ICD-10-CM | POA: Diagnosis not present

## 2018-01-30 ENCOUNTER — Telehealth: Payer: Self-pay | Admitting: Neurology

## 2018-01-30 MED ORDER — GABAPENTIN 100 MG PO CAPS: ORAL_CAPSULE | ORAL | 3 refills | Status: DC

## 2018-01-30 NOTE — Telephone Encounter (Signed)
Pt daughter(on DPR-Mitchell,Bunny) has called to inform that last week the results of the Botox have been working, pt has been pain free. Current dose of  Gabapentin is working well.  Daughter wants the  [facility] for DOD - [place], [place] .  Daughter wants this as a 66 day.  No call back requested but welcome if RN has questions.

## 2018-01-30 NOTE — Telephone Encounter (Signed)
90-day rx sent to [facility].

## 2018-01-30 NOTE — Addendum Note (Signed)
Addended by: Noberto Retort C on: 01/30/2018 03:16 PM   Modules accepted: Orders

## 2018-01-31 DIAGNOSIS — M542 Cervicalgia: Secondary | ICD-10-CM | POA: Diagnosis not present

## 2018-02-03 DIAGNOSIS — M542 Cervicalgia: Secondary | ICD-10-CM | POA: Diagnosis not present

## 2018-02-04 DIAGNOSIS — M542 Cervicalgia: Secondary | ICD-10-CM | POA: Diagnosis not present

## 2018-02-05 DIAGNOSIS — M542 Cervicalgia: Secondary | ICD-10-CM | POA: Diagnosis not present

## 2018-02-10 ENCOUNTER — Encounter: Payer: Self-pay | Admitting: Internal Medicine

## 2018-02-10 ENCOUNTER — Encounter (INDEPENDENT_AMBULATORY_CARE_PROVIDER_SITE_OTHER): Payer: Self-pay

## 2018-02-10 ENCOUNTER — Ambulatory Visit (INDEPENDENT_AMBULATORY_CARE_PROVIDER_SITE_OTHER): Payer: Medicare Other | Admitting: Internal Medicine

## 2018-02-10 VITALS — BP 158/70 | HR 81 | Ht 64.0 in | Wt 135.6 lb

## 2018-02-10 DIAGNOSIS — I442 Atrioventricular block, complete: Secondary | ICD-10-CM | POA: Diagnosis not present

## 2018-02-10 DIAGNOSIS — Z950 Presence of cardiac pacemaker: Secondary | ICD-10-CM

## 2018-02-10 LAB — CUP PACEART INCLINIC DEVICE CHECK
Implantable Lead Implant Date: 20140703
Implantable Lead Location: 753860
Implantable Pulse Generator Implant Date: 20140703
MDC IDC LEAD IMPLANT DT: 20140703
MDC IDC LEAD LOCATION: 753859
MDC IDC PG SERIAL: 7522008
MDC IDC SESS DTM: 20191202092852

## 2018-02-10 NOTE — Progress Notes (Signed)
HPI Wendy Velasquez returns today for follow-up of symptomatic tachybradycardia syndrome status post pacemaker insertion.. She is a very pleasant 84 year old woman who underwent pacemaker insertion approximately 5 years ago. Her medical therapy was uptitrated. In the interim, she is been stable except that she is bothered by arthritis, mostly in her knee. No chest pain, no shortness of breath.   No Known Allergies   Current Outpatient Medications  Medication Sig Dispense Refill  . acetaminophen (TYLENOL) 500 MG tablet Take 1,000 mg by mouth every 6 (six) hours as needed for fever.    Marland Kitchen albuterol (PROVENTIL) (2.5 MG/3ML) 0.083% nebulizer solution Take 2.5 mg by nebulization every 6 (six) hours as needed for wheezing or shortness of breath.    Marland Kitchen albuterol-ipratropium (COMBIVENT) 18-103 MCG/ACT inhaler Inhale 2 puffs into the lungs every 6 (six) hours as needed for wheezing.    Marland Kitchen aspirin EC 81 MG tablet Take 81 mg by mouth daily.    . Brinzolamide-Brimonidine (SIMBRINZA) 1-0.2 % SUSP Apply 1 drop to eye at bedtime.    . cetirizine (ZYRTEC) 10 MG tablet Take 10 mg by mouth daily.    . Esomeprazole Magnesium (NEXIUM PO) Take 1 tablet by mouth daily.    . fluticasone (FLONASE) 50 MCG/ACT nasal spray Place 2 sprays into both nostrils as needed. SHAKE AS DIRECTED     . Fluticasone-Salmeterol (ADVAIR DISKUS) 250-50 MCG/DOSE AEPB Inhale 1 puff into the lungs 2 (two) times daily.    Marland Kitchen gabapentin (NEURONTIN) 100 MG capsule Take 2 capsules in am, 2 capsules midday, 1 capsule at bedtime. 450 capsule 3  . ibandronate (BONIVA) 150 MG tablet Take 150 mg by mouth every 30 (thirty) days. Take in the morning with a full glass of water, on an empty stomach, and do not take anything else by mouth or lie down for the next 30 min.    . insulin glargine (LANTUS) 100 UNIT/ML injection Inject 12 Units into the skin at bedtime. 9 pm daily    . metFORMIN (GLUCOPHAGE) 1000 MG tablet Take 1,000 mg by mouth 2 (two)  times daily with a meal.    . MYRBETRIQ 25 MG TB24 tablet Take 25 mg by mouth daily.   6  . sertraline (ZOLOFT) 50 MG tablet Take 50 mg by mouth 2 (two) times daily.    Marland Kitchen trimethoprim (TRIMPEX) 100 MG tablet Take 1 tablet (100 mg total) by mouth daily. OK TO RESUME AFTER YOU FINISH CURRENT ANTIBIOTIC THERAPY.    . vitamin B-12 (CYANOCOBALAMIN) 1000 MCG tablet Take 1,000 mcg by mouth daily.     No current facility-administered medications for this visit.      Past Medical History:  Diagnosis Date  . Asthma   . Cardiac pacemaker in situ   . Complete heart block ([place])   . COPD (chronic obstructive pulmonary disease) ([place])   . Diverticulosis of colon   . GERD (gastroesophageal reflux disease)   . Glaucoma of both eyes   . Heart disease   . History of cardiac arrest    09-11-2012--  symptomatic complete heart block -- hr 20's--  cpr, intubated and temp. pacer until  perm. pacemaker placement  . History of esophageal dilatation    for stricture 2007  . History of hiatal hernia   . History of iron deficiency anemia    hx transfusion's 15 yrs ago , approx 2001  . Hyperlipidemia   . Hypertension   . Neck pain   . Peripheral neuropathy   .  Tachy-brady syndrome ([place])   . Type 2 diabetes mellitus ([place])   . Urgency incontinence    Botox injections for bladder.  . Wears glasses     ROS:   All systems reviewed and negative except as noted in the HPI.   Past Surgical History:  Procedure Laterality Date  . ABDOMINAL HYSTERECTOMY  1976  approx   w/ unilateral salpingoophorectomy  . CATARACT EXTRACTION W/ INTRAOCULAR LENS  IMPLANT, BILATERAL  1990  . COLONOSCOPY WITH ESOPHAGOGASTRODUODENOSCOPY (EGD)  last one 2007  . INTERSTIM IMPLANT PLACEMENT  2012  . INTERSTIM IMPLANT REMOVAL N/A 09/21/2014   Procedure: REMOVAL OF INTERSTIM IMPLANT;  Surgeon: Bjorn Loser, MD;  Location[facility];  Service: Urology;  Laterality: N/A;  . INTERSTIM IMPLANT REVISION N/A 09/21/2014    Procedure: Barrie Lyme STAGE ONE AND TWO;  Surgeon: Bjorn Loser, MD;  Location: [facility];  Service: Urology;  Laterality: N/A;  . LAPAROTOMY W/ UNILATERAL SALPINGOOPHORECTOMY  1960's  approx  . PERMANENT PACEMAKER INSERTION N/A 09/11/2012   Procedure: PERMANENT PACEMAKER INSERTION;  Surgeon: Evans Lance, MD;  Location: [facility] CATH LAB;  Service: Cardiovascular;  Laterality: N/A;  St. Jude Dual Chamber  . RECTOCELE REPAIR  1991  approx     Family History  Problem Relation Age of Onset  . Other Mother        died during her childbirth  . Prostatitis Father   . Heart failure Sister   . Breast cancer Sister   . Diabetic kidney disease Sister   . Melanoma Brother      Social History   Socioeconomic History  . Marital status: Married    Spouse name: Not on file  . Number of children: 3  . Years of education: 1  . Highest education level: High school graduate  Occupational History  . Occupation: Retired  Scientific laboratory technician  . Financial resource strain: Not on file  . Food insecurity:    Worry: Not on file    Inability: Not on file  . Transportation needs:    Medical: Not on file    Non-medical: Not on file  Tobacco Use  . Smoking status: Never Smoker  . Smokeless tobacco: Never Used  Substance and Sexual Activity  . Alcohol use: No  . Drug use: No  . Sexual activity: Not on file  Lifestyle  . Physical activity:    Days per week: Not on file    Minutes per session: Not on file  . Stress: Not on file  Relationships  . Social connections:    Talks on phone: Not on file    Gets together: Not on file    Attends religious service: Not on file    Active member of club or organization: Not on file    Attends meetings of clubs or organizations: Not on file    Relationship status: Not on file  . Intimate partner violence:    Fear of current or ex partner: Not on file    Emotionally abused: Not on file    Physically abused: Not on file    Forced sexual  activity: Not on file  Other Topics Concern  . Not on file  Social History Narrative   Lives with her husband in an independent living facility.   Right-handed.   1-2 cup caffeine daily.     BP (!) 158/70   Pulse 81   Ht 5\' 4"  (1.626 m)   Wt 135 lb 9.6 oz (61.5 kg)  BMI 23.28 kg/m   Physical Exam:  Well appearing NAD HEENT: Unremarkable Neck:  No JVD, no thyromegally Lymphatics:  No adenopathy Back:  No CVA tenderness Lungs:  Clear HEART:  Regular rate rhythm, no murmurs, no rubs, no clicks Abd:  soft, positive bowel sounds, no organomegally, no rebound, no guarding Ext:  2 plus pulses, 2+ edema, no cyanosis, no clubbing Skin:  No rashes no nodules Neuro:  CN II through XII intact, motor grossly intact  EKG - nsr with ventricular pacing  DEVICE  Normal device function.  See PaceArt for details.   Assess/Plan: 1. CHB - she is asymptomatic, s/p PPM insertion. 2. PPM - her St. Jude DDD PM is working normally. We will recheck in several months. 3. HTN - her blood pressure is up a bit today. She is encouraged to eat less salt.  Mikle Bosworth.D.

## 2018-02-10 NOTE — Patient Instructions (Signed)
Medication Instructions:  Your physician recommends that you continue on your current medications as directed. Please refer to the Current Medication list given to you today.  Labwork: None ordered.  Testing/Procedures: None ordered.  Follow-Up: Your physician wants you to follow-up in: one year with Dr. Lovena Le.   You will receive a reminder letter in the mail two months in advance. If you don't receive a letter, please call our office to schedule the follow-up appointment.  Remote monitoring is used to monitor your Pacemaker from home. This monitoring reduces the number of office visits required to check your device to one time per year. It allows [place] to keep an eye on the functioning of your device to ensure it is working properly. You are scheduled for a device check from home on 02/11/2018. You may send your transmission at any time that day. If you have a wireless device, the transmission will be sent automatically. After your physician reviews your transmission, you will receive a postcard with your next transmission date.  Any Other Special Instructions Will Be Listed Below (If Applicable).  If you need a refill on your cardiac medications before your next appointment, please call your pharmacy.

## 2018-02-11 ENCOUNTER — Ambulatory Visit (INDEPENDENT_AMBULATORY_CARE_PROVIDER_SITE_OTHER): Payer: Medicare Other

## 2018-02-11 DIAGNOSIS — I442 Atrioventricular block, complete: Secondary | ICD-10-CM | POA: Diagnosis not present

## 2018-02-11 DIAGNOSIS — M542 Cervicalgia: Secondary | ICD-10-CM | POA: Diagnosis not present

## 2018-02-11 NOTE — Progress Notes (Signed)
Remote pacemaker transmission.   

## 2018-02-12 DIAGNOSIS — M542 Cervicalgia: Secondary | ICD-10-CM | POA: Diagnosis not present

## 2018-02-14 DIAGNOSIS — M542 Cervicalgia: Secondary | ICD-10-CM | POA: Diagnosis not present

## 2018-02-17 DIAGNOSIS — M25561 Pain in right knee: Secondary | ICD-10-CM | POA: Diagnosis not present

## 2018-02-17 DIAGNOSIS — Z6823 Body mass index (BMI) 23.0-23.9, adult: Secondary | ICD-10-CM | POA: Diagnosis not present

## 2018-02-18 ENCOUNTER — Encounter: Payer: Self-pay | Admitting: Cardiology

## 2018-02-18 DIAGNOSIS — M542 Cervicalgia: Secondary | ICD-10-CM | POA: Diagnosis not present

## 2018-02-21 DIAGNOSIS — M25561 Pain in right knee: Secondary | ICD-10-CM | POA: Diagnosis not present

## 2018-02-21 DIAGNOSIS — R2681 Unsteadiness on feet: Secondary | ICD-10-CM | POA: Diagnosis not present

## 2018-02-21 DIAGNOSIS — M6281 Muscle weakness (generalized): Secondary | ICD-10-CM | POA: Diagnosis not present

## 2018-02-24 DIAGNOSIS — M25561 Pain in right knee: Secondary | ICD-10-CM | POA: Diagnosis not present

## 2018-02-24 DIAGNOSIS — M6281 Muscle weakness (generalized): Secondary | ICD-10-CM | POA: Diagnosis not present

## 2018-02-24 DIAGNOSIS — R2681 Unsteadiness on feet: Secondary | ICD-10-CM | POA: Diagnosis not present

## 2018-02-25 DIAGNOSIS — N3942 Incontinence without sensory awareness: Secondary | ICD-10-CM | POA: Diagnosis not present

## 2018-02-25 DIAGNOSIS — N3000 Acute cystitis without hematuria: Secondary | ICD-10-CM | POA: Diagnosis not present

## 2018-02-25 DIAGNOSIS — R3914 Feeling of incomplete bladder emptying: Secondary | ICD-10-CM | POA: Diagnosis not present

## 2018-02-25 DIAGNOSIS — N3020 Other chronic cystitis without hematuria: Secondary | ICD-10-CM | POA: Diagnosis not present

## 2018-02-26 DIAGNOSIS — M25561 Pain in right knee: Secondary | ICD-10-CM | POA: Diagnosis not present

## 2018-02-26 DIAGNOSIS — R2681 Unsteadiness on feet: Secondary | ICD-10-CM | POA: Diagnosis not present

## 2018-02-26 DIAGNOSIS — M6281 Muscle weakness (generalized): Secondary | ICD-10-CM | POA: Diagnosis not present

## 2018-02-27 DIAGNOSIS — M25561 Pain in right knee: Secondary | ICD-10-CM | POA: Diagnosis not present

## 2018-02-27 DIAGNOSIS — M6281 Muscle weakness (generalized): Secondary | ICD-10-CM | POA: Diagnosis not present

## 2018-02-27 DIAGNOSIS — R2681 Unsteadiness on feet: Secondary | ICD-10-CM | POA: Diagnosis not present

## 2018-03-03 DIAGNOSIS — M6281 Muscle weakness (generalized): Secondary | ICD-10-CM | POA: Diagnosis not present

## 2018-03-03 DIAGNOSIS — R2681 Unsteadiness on feet: Secondary | ICD-10-CM | POA: Diagnosis not present

## 2018-03-03 DIAGNOSIS — M25561 Pain in right knee: Secondary | ICD-10-CM | POA: Diagnosis not present

## 2018-03-04 DIAGNOSIS — R2681 Unsteadiness on feet: Secondary | ICD-10-CM | POA: Diagnosis not present

## 2018-03-04 DIAGNOSIS — M25561 Pain in right knee: Secondary | ICD-10-CM | POA: Diagnosis not present

## 2018-03-04 DIAGNOSIS — M6281 Muscle weakness (generalized): Secondary | ICD-10-CM | POA: Diagnosis not present

## 2018-03-06 DIAGNOSIS — R2681 Unsteadiness on feet: Secondary | ICD-10-CM | POA: Diagnosis not present

## 2018-03-06 DIAGNOSIS — M25561 Pain in right knee: Secondary | ICD-10-CM | POA: Diagnosis not present

## 2018-03-06 DIAGNOSIS — M6281 Muscle weakness (generalized): Secondary | ICD-10-CM | POA: Diagnosis not present

## 2018-03-11 DIAGNOSIS — M6281 Muscle weakness (generalized): Secondary | ICD-10-CM | POA: Diagnosis not present

## 2018-03-11 DIAGNOSIS — M25561 Pain in right knee: Secondary | ICD-10-CM | POA: Diagnosis not present

## 2018-03-11 DIAGNOSIS — R2681 Unsteadiness on feet: Secondary | ICD-10-CM | POA: Diagnosis not present

## 2018-03-13 DIAGNOSIS — R2681 Unsteadiness on feet: Secondary | ICD-10-CM | POA: Diagnosis not present

## 2018-03-13 DIAGNOSIS — M6281 Muscle weakness (generalized): Secondary | ICD-10-CM | POA: Diagnosis not present

## 2018-03-13 DIAGNOSIS — M25561 Pain in right knee: Secondary | ICD-10-CM | POA: Diagnosis not present

## 2018-03-14 DIAGNOSIS — R2681 Unsteadiness on feet: Secondary | ICD-10-CM | POA: Diagnosis not present

## 2018-03-14 DIAGNOSIS — M6281 Muscle weakness (generalized): Secondary | ICD-10-CM | POA: Diagnosis not present

## 2018-03-14 DIAGNOSIS — M25561 Pain in right knee: Secondary | ICD-10-CM | POA: Diagnosis not present

## 2018-03-18 DIAGNOSIS — E559 Vitamin D deficiency, unspecified: Secondary | ICD-10-CM | POA: Diagnosis not present

## 2018-03-18 DIAGNOSIS — Z1389 Encounter for screening for other disorder: Secondary | ICD-10-CM | POA: Diagnosis not present

## 2018-03-18 DIAGNOSIS — D692 Other nonthrombocytopenic purpura: Secondary | ICD-10-CM | POA: Diagnosis not present

## 2018-03-18 DIAGNOSIS — M25561 Pain in right knee: Secondary | ICD-10-CM | POA: Diagnosis not present

## 2018-03-18 DIAGNOSIS — N183 Chronic kidney disease, stage 3 (moderate): Secondary | ICD-10-CM | POA: Diagnosis not present

## 2018-03-18 DIAGNOSIS — Z6823 Body mass index (BMI) 23.0-23.9, adult: Secondary | ICD-10-CM | POA: Diagnosis not present

## 2018-03-18 DIAGNOSIS — I129 Hypertensive chronic kidney disease with stage 1 through stage 4 chronic kidney disease, or unspecified chronic kidney disease: Secondary | ICD-10-CM | POA: Diagnosis not present

## 2018-03-18 DIAGNOSIS — R808 Other proteinuria: Secondary | ICD-10-CM | POA: Diagnosis not present

## 2018-03-18 DIAGNOSIS — E538 Deficiency of other specified B group vitamins: Secondary | ICD-10-CM | POA: Diagnosis not present

## 2018-03-18 DIAGNOSIS — R2681 Unsteadiness on feet: Secondary | ICD-10-CM | POA: Diagnosis not present

## 2018-03-18 DIAGNOSIS — E1129 Type 2 diabetes mellitus with other diabetic kidney complication: Secondary | ICD-10-CM | POA: Diagnosis not present

## 2018-03-18 DIAGNOSIS — M6281 Muscle weakness (generalized): Secondary | ICD-10-CM | POA: Diagnosis not present

## 2018-03-18 DIAGNOSIS — J4550 Severe persistent asthma, uncomplicated: Secondary | ICD-10-CM | POA: Diagnosis not present

## 2018-03-18 DIAGNOSIS — Z950 Presence of cardiac pacemaker: Secondary | ICD-10-CM | POA: Diagnosis not present

## 2018-03-18 DIAGNOSIS — E7800 Pure hypercholesterolemia, unspecified: Secondary | ICD-10-CM | POA: Diagnosis not present

## 2018-03-19 DIAGNOSIS — M25561 Pain in right knee: Secondary | ICD-10-CM | POA: Diagnosis not present

## 2018-03-19 DIAGNOSIS — R2681 Unsteadiness on feet: Secondary | ICD-10-CM | POA: Diagnosis not present

## 2018-03-19 DIAGNOSIS — M6281 Muscle weakness (generalized): Secondary | ICD-10-CM | POA: Diagnosis not present

## 2018-03-21 DIAGNOSIS — M6281 Muscle weakness (generalized): Secondary | ICD-10-CM | POA: Diagnosis not present

## 2018-03-21 DIAGNOSIS — M25561 Pain in right knee: Secondary | ICD-10-CM | POA: Diagnosis not present

## 2018-03-21 DIAGNOSIS — R2681 Unsteadiness on feet: Secondary | ICD-10-CM | POA: Diagnosis not present

## 2018-03-24 DIAGNOSIS — R2681 Unsteadiness on feet: Secondary | ICD-10-CM | POA: Diagnosis not present

## 2018-03-24 DIAGNOSIS — M6281 Muscle weakness (generalized): Secondary | ICD-10-CM | POA: Diagnosis not present

## 2018-03-24 DIAGNOSIS — M25561 Pain in right knee: Secondary | ICD-10-CM | POA: Diagnosis not present

## 2018-03-25 DIAGNOSIS — R2681 Unsteadiness on feet: Secondary | ICD-10-CM | POA: Diagnosis not present

## 2018-03-25 DIAGNOSIS — M25561 Pain in right knee: Secondary | ICD-10-CM | POA: Diagnosis not present

## 2018-03-25 DIAGNOSIS — M6281 Muscle weakness (generalized): Secondary | ICD-10-CM | POA: Diagnosis not present

## 2018-03-27 DIAGNOSIS — M6281 Muscle weakness (generalized): Secondary | ICD-10-CM | POA: Diagnosis not present

## 2018-03-27 DIAGNOSIS — M25561 Pain in right knee: Secondary | ICD-10-CM | POA: Diagnosis not present

## 2018-03-27 DIAGNOSIS — R2681 Unsteadiness on feet: Secondary | ICD-10-CM | POA: Diagnosis not present

## 2018-03-31 DIAGNOSIS — R2681 Unsteadiness on feet: Secondary | ICD-10-CM | POA: Diagnosis not present

## 2018-03-31 DIAGNOSIS — M25561 Pain in right knee: Secondary | ICD-10-CM | POA: Diagnosis not present

## 2018-03-31 DIAGNOSIS — M6281 Muscle weakness (generalized): Secondary | ICD-10-CM | POA: Diagnosis not present

## 2018-04-02 DIAGNOSIS — M6281 Muscle weakness (generalized): Secondary | ICD-10-CM | POA: Diagnosis not present

## 2018-04-02 DIAGNOSIS — M25561 Pain in right knee: Secondary | ICD-10-CM | POA: Diagnosis not present

## 2018-04-02 DIAGNOSIS — R2681 Unsteadiness on feet: Secondary | ICD-10-CM | POA: Diagnosis not present

## 2018-04-03 DIAGNOSIS — R2681 Unsteadiness on feet: Secondary | ICD-10-CM | POA: Diagnosis not present

## 2018-04-03 DIAGNOSIS — M6281 Muscle weakness (generalized): Secondary | ICD-10-CM | POA: Diagnosis not present

## 2018-04-03 DIAGNOSIS — M25561 Pain in right knee: Secondary | ICD-10-CM | POA: Diagnosis not present

## 2018-04-03 LAB — CUP PACEART REMOTE DEVICE CHECK
Battery Remaining Longevity: 109 mo
Battery Remaining Percentage: 89 %
Battery Voltage: 2.95 V
Brady Statistic AP VS Percent: 1 %
Brady Statistic AS VS Percent: 1 %
Implantable Lead Implant Date: 20140703
Implantable Lead Implant Date: 20140703
Implantable Lead Location: 753860
Implantable Pulse Generator Implant Date: 20140703
Lead Channel Impedance Value: 440 Ohm
Lead Channel Impedance Value: 450 Ohm
Lead Channel Pacing Threshold Amplitude: 0.75 V
Lead Channel Pacing Threshold Amplitude: 0.75 V
Lead Channel Pacing Threshold Pulse Width: 0.4 ms
Lead Channel Sensing Intrinsic Amplitude: 2.1 mV
Lead Channel Setting Pacing Amplitude: 1 V
Lead Channel Setting Pacing Pulse Width: 0.4 ms
MDC IDC LEAD LOCATION: 753859
MDC IDC MSMT LEADCHNL RA PACING THRESHOLD PULSEWIDTH: 0.4 ms
MDC IDC MSMT LEADCHNL RV SENSING INTR AMPL: 12 mV
MDC IDC SESS DTM: 20191202080043
MDC IDC SET LEADCHNL RA PACING AMPLITUDE: 2 V
MDC IDC SET LEADCHNL RV SENSING SENSITIVITY: 12.5 mV
MDC IDC STAT BRADY AP VP PERCENT: 1 %
MDC IDC STAT BRADY AS VP PERCENT: 99 %
MDC IDC STAT BRADY RA PERCENT PACED: 1 %
MDC IDC STAT BRADY RV PERCENT PACED: 99 %
Pulse Gen Serial Number: 7522008

## 2018-04-07 DIAGNOSIS — R2681 Unsteadiness on feet: Secondary | ICD-10-CM | POA: Diagnosis not present

## 2018-04-07 DIAGNOSIS — M6281 Muscle weakness (generalized): Secondary | ICD-10-CM | POA: Diagnosis not present

## 2018-04-07 DIAGNOSIS — M25561 Pain in right knee: Secondary | ICD-10-CM | POA: Diagnosis not present

## 2018-04-08 ENCOUNTER — Ambulatory Visit (INDEPENDENT_AMBULATORY_CARE_PROVIDER_SITE_OTHER): Payer: Medicare Other | Admitting: Neurology

## 2018-04-08 ENCOUNTER — Encounter: Payer: Self-pay | Admitting: Neurology

## 2018-04-08 ENCOUNTER — Telehealth: Payer: Self-pay | Admitting: Neurology

## 2018-04-08 VITALS — BP 138/66 | HR 80 | Ht 64.0 in | Wt 134.5 lb

## 2018-04-08 DIAGNOSIS — G243 Spasmodic torticollis: Secondary | ICD-10-CM | POA: Diagnosis not present

## 2018-04-08 DIAGNOSIS — M542 Cervicalgia: Secondary | ICD-10-CM | POA: Insufficient documentation

## 2018-04-08 NOTE — Progress Notes (Signed)
PATIENT: Wendy Velasquez DOB: [birth year]  Chief Complaint  Patient presents with  . Cervical Dystonia    Botox 100 units x 1 vial - sample      HISTORICAL  Wendy Velasquez is a 85 years old female, seen in request by her primary care physician Dr. Osborne Casco, Fransico Him for evaluation of neck pain, headaches, initial evaluation was on December 05, 2017.  She is accompanied by her daughter at today's clinical visit.  I have reviewed and summarized the referring note from the referring physician, she has past medical history of diabetes, use insulin, history of cardiac block, status post pacemaker, hyperlipidemia,  She had a history of chronic neck pain, had acute worsening since March 2018, she woke up one morning, noticed upper neck throbbing pain, radiating pain to her skull, has been persistent since then, over the last 1 year, she was seen by orthopedic PA, Ronette Deter, was treated with multiple trigger point injection, with mixture of 1% lidocaine, and Depo-Medrol, most recent injection was on October 10, 2017, on a monthly basis, patient denies significant improvement, also complains of worsening glucose control after injection,  She denies significant radiating pain to bilateral shoulder upper extremity, denies difficulty using arms, she had chronic urinary incontinence is receiving Botox injection by urologist, she contributed to her gait abnormality to her bilateral knee pain, chronic arthritis,  She now complains of difficulty turning her neck, 6 out of 10 constant deep achy pain, multiple tender spots at cervical, upper trapezius, oftentimes she has transient sharp radiating range of motion, difficulty turning her neck,  She also tried physical therapy, is doing morning neck traction regularly,  UPDATE Jan 02 2018: She continue complains of significant neck pain, 8 out of 10 sometimes, radiating pain to bilateral occipital region despite gabapentin 100 mg 5 tablets each day, I  have personally reviewed CT cervical spine on December 10, 2017, no acute abnormality, multilevel spondylolisthesis, maximum at C4-5, with associated   facet arthropathy, evidence of ankylosis, mild spinal stenosis at the C2-3, C3-4 level  UPDATE Apr 08 2018: She responded very well to her initial injection January 02, 2018, we used 100 units of Botox, a month after injection she was pain-free for 2 months, there was no significant side effect noticed, in the past 2 weeks, she noticed returning of her neck pain, centered at bilateral mastoid process, nuchal line, tenderness upon deep palpitation  REVIEW OF SYSTEMS: Full 14 system review of systems performed and notable only for headache All other review of systems were negative.  ALLERGIES: No Known Allergies  HOME MEDICATIONS: Current Outpatient Medications  Medication Sig Dispense Refill  . acetaminophen (TYLENOL) 500 MG tablet Take 1,000 mg by mouth every 6 (six) hours as needed for fever.    Marland Kitchen albuterol (PROVENTIL) (2.5 MG/3ML) 0.083% nebulizer solution Take 2.5 mg by nebulization every 6 (six) hours as needed for wheezing or shortness of breath.    Marland Kitchen albuterol-ipratropium (COMBIVENT) 18-103 MCG/ACT inhaler Inhale 2 puffs into the lungs every 6 (six) hours as needed for wheezing.    Marland Kitchen aspirin EC 81 MG tablet Take 81 mg by mouth daily.    . Brinzolamide-Brimonidine (SIMBRINZA) 1-0.2 % SUSP Apply 1 drop to eye at bedtime.    . cetirizine (ZYRTEC) 10 MG tablet Take 10 mg by mouth daily.    . Esomeprazole Magnesium (NEXIUM PO) Take 1 tablet by mouth daily.    . fluticasone (FLONASE) 50 MCG/ACT nasal spray Place 2 sprays into  both nostrils as needed. SHAKE AS DIRECTED     . Fluticasone-Salmeterol (ADVAIR DISKUS) 250-50 MCG/DOSE AEPB Inhale 1 puff into the lungs 2 (two) times daily.    Marland Kitchen gabapentin (NEURONTIN) 100 MG capsule Take 2 capsules in am, 2 capsules midday, 1 capsule at bedtime. 450 capsule 3  . ibandronate (BONIVA) 150 MG tablet Take  150 mg by mouth every 30 (thirty) days. Take in the morning with a full glass of water, on an empty stomach, and do not take anything else by mouth or lie down for the next 30 min.    . insulin glargine (LANTUS) 100 UNIT/ML injection Inject 12 Units into the skin at bedtime. 9 pm daily    . metFORMIN (GLUCOPHAGE) 1000 MG tablet Take 1,000 mg by mouth 2 (two) times daily with a meal.    . MYRBETRIQ 25 MG TB24 tablet Take 25 mg by mouth daily.   6  . sertraline (ZOLOFT) 50 MG tablet Take 50 mg by mouth 2 (two) times daily.    Marland Kitchen trimethoprim (TRIMPEX) 100 MG tablet Take 1 tablet (100 mg total) by mouth daily. OK TO RESUME AFTER YOU FINISH CURRENT ANTIBIOTIC THERAPY.    . vitamin B-12 (CYANOCOBALAMIN) 1000 MCG tablet Take 1,000 mcg by mouth daily.     No current facility-administered medications for this visit.     PAST MEDICAL HISTORY: Past Medical History:  Diagnosis Date  . Asthma   . Cardiac pacemaker in situ   . Complete heart block ([place])   . COPD (chronic obstructive pulmonary disease) ([place])   . Diverticulosis of colon   . GERD (gastroesophageal reflux disease)   . Glaucoma of both eyes   . Heart disease   . History of cardiac arrest    09-11-2012--  symptomatic complete heart block -- hr 20's--  cpr, intubated and temp. pacer until  perm. pacemaker placement  . History of esophageal dilatation    for stricture 2007  . History of hiatal hernia   . History of iron deficiency anemia    hx transfusion's 15 yrs ago , approx 2001  . Hyperlipidemia   . Hypertension   . Neck pain   . Peripheral neuropathy   . Tachy-brady syndrome ([place])   . Type 2 diabetes mellitus ([place])   . Urgency incontinence    Botox injections for bladder.  . Wears glasses     PAST SURGICAL HISTORY: Past Surgical History:  Procedure Laterality Date  . ABDOMINAL HYSTERECTOMY  1976  approx   w/ unilateral salpingoophorectomy  . CATARACT EXTRACTION W/ INTRAOCULAR LENS  IMPLANT, BILATERAL  1990  . COLONOSCOPY  WITH ESOPHAGOGASTRODUODENOSCOPY (EGD)  last one 2007  . INTERSTIM IMPLANT PLACEMENT  2012  . INTERSTIM IMPLANT REMOVAL N/A 09/21/2014   Procedure: REMOVAL OF INTERSTIM IMPLANT;  Surgeon: Bjorn Loser, MD;  Location: [facility];  Service: Urology;  Laterality: N/A;  . INTERSTIM IMPLANT REVISION N/A 09/21/2014   Procedure: Barrie Lyme STAGE ONE AND TWO;  Surgeon: Bjorn Loser, MD;  Location: [facility];  Service: Urology;  Laterality: N/A;  . LAPAROTOMY W/ UNILATERAL SALPINGOOPHORECTOMY  1960's  approx  . PERMANENT PACEMAKER INSERTION N/A 09/11/2012   Procedure: PERMANENT PACEMAKER INSERTION;  Surgeon: Evans Lance, MD;  Location: [facility] CATH LAB;  Service: Cardiovascular;  Laterality: N/A;  St. Jude Dual Chamber  . RECTOCELE REPAIR  1991  approx    FAMILY HISTORY: Family History  Problem Relation Age of Onset  . Other Mother  died during her childbirth  . Prostatitis Father   . Heart failure Sister   . Breast cancer Sister   . Diabetic kidney disease Sister   . Melanoma Brother     SOCIAL HISTORY: Social History   Socioeconomic History  . Marital status: Married    Spouse name: Not on file  . Number of children: 3  . Years of education: 69  . Highest education level: High school graduate  Occupational History  . Occupation: Retired  Scientific laboratory technician  . Financial resource strain: Not on file  . Food insecurity:    Worry: Not on file    Inability: Not on file  . Transportation needs:    Medical: Not on file    Non-medical: Not on file  Tobacco Use  . Smoking status: Never Smoker  . Smokeless tobacco: Never Used  Substance and Sexual Activity  . Alcohol use: No  . Drug use: No  . Sexual activity: Not on file  Lifestyle  . Physical activity:    Days per week: Not on file    Minutes per session: Not on file  . Stress: Not on file  Relationships  . Social connections:    Talks on phone: Not on file    Gets together: Not on file     Attends religious service: Not on file    Active member of club or organization: Not on file    Attends meetings of clubs or organizations: Not on file    Relationship status: Not on file  . Intimate partner violence:    Fear of current or ex partner: Not on file    Emotionally abused: Not on file    Physically abused: Not on file    Forced sexual activity: Not on file  Other Topics Concern  . Not on file  Social History Narrative   Lives with her husband in an independent living facility.   Right-handed.   1-2 cup caffeine daily.     PHYSICAL EXAM   Vitals:   04/08/18 0906  BP: 138/66  Pulse: 80  Weight: 134 lb 8 oz (61 kg)  Height: 5\' 4"  (1.626 m)    Not recorded      Body mass index is 23.09 kg/m.  PHYSICAL EXAMNIATION:  Gen: NAD, conversant, well nourised, obese, well groomed                     Cardiovascular: Regular rate rhythm, no peripheral edema, warm, nontender. Eyes: Conjunctivae clear without exudates or hemorrhage Neck: Supple, no carotid bruits. Pulmonary: Clear to auscultation bilaterally   NEUROLOGICAL EXAM:  MENTAL STATUS: Speech:    Speech is normal; fluent and spontaneous with normal comprehension.  Cognition:     Orientation to time, place and person     Normal recent and remote memory     Normal Attention span and concentration     Normal Language, naming, repeating,spontaneous speech     Fund of knowledge   CRANIAL NERVES: CN II: Visual fields are full to confrontation.  Pupils are round equal and briskly reactive to light. CN III, IV, VI: extraocular movement are normal. No ptosis. CN V: Facial sensation is intact to pinprick in all 3 divisions bilaterally. Corneal responses are intact.  CN VII: Face is symmetric with normal eye closure and smile. CN VIII: Hearing is normal to rubbing fingers CN IX, X: Palate elevates symmetrically. Phonation is normal. CN XI: Head turning and shoulder shrug are intact CN XII:  Tongue is midline  with normal movements and no atrophy.  MOTOR: There is no pronator drift of out-stretched arms. Muscle bulk and tone are normal. Muscle strength is normal.  She has limited range of motion, difficulty turning her neck to the extreme right or left,  REFLEXES: Reflexes are hypoactive and symmetric at the biceps, triceps, knees, and ankles. Plantar responses are flexor.  SENSORY: Intact to light touch, pinprick, positional sensation and vibratory sensation are intact in fingers and toes.  COORDINATION: Rapid alternating movements and fine finger movements are intact. There is no dysmetria on finger-to-nose and heel-knee-shin.    GAIT/STANCE: She needs pushed up to get up from seated position, cautious, mildly unsteady   DIAGNOSTIC DATA (LABS, IMAGING, TESTING) - I reviewed patient records, labs, notes, testing and imaging myself where available.   ASSESSMENT AND PLAN  ASLIN FARINAS is a 85 y.o. female    Chronic neck pain, headaches Abnormal neck posturing, mild retrocollis, left tilt, significant tenderness at bilateral mastoid process  Consistent with upper cervical degenerative disc disease  CT cervical showed evidence of multilevel spondylolisthesis, maximum at C4-5 with associated facet arthropathy, developing ankylosis  She had tried and failed multiple treatment in the past, including neck traction, heating pad, trigger point injection, gabapentin, responded very well to EMG guided Botox injection, initial injection was on January 02, 2018,  Repeat EMG guided Botox injection Used 100 units of Botox A simple  Right longissimus capitis 25 units Right splenius cervix 12.5 Right semispinalis 12.5 units  Left longissimus capitis 25 units Left splenius cervix 12.5 units Left semispinalis 12.5 units   Preauthorization for BOTOX 200 units, will only use 100 units at next injection   Marcial Pacas, M.D. Ph.D.  [facility] [address] [place], [place] [address] Ph: [phone] Fax: [phone]  CC: Domenick Gong

## 2018-04-08 NOTE — Telephone Encounter (Addendum)
She responded well to BOTOX A injection, will continue with BOTOX A, pre authorization for 200 units, will only use 100 units at next injection

## 2018-04-09 DIAGNOSIS — M25561 Pain in right knee: Secondary | ICD-10-CM | POA: Diagnosis not present

## 2018-04-09 DIAGNOSIS — M6281 Muscle weakness (generalized): Secondary | ICD-10-CM | POA: Diagnosis not present

## 2018-04-09 DIAGNOSIS — R2681 Unsteadiness on feet: Secondary | ICD-10-CM | POA: Diagnosis not present

## 2018-04-10 NOTE — Telephone Encounter (Signed)
Noted, I have placed this in the apt note. DW

## 2018-04-11 DIAGNOSIS — M25561 Pain in right knee: Secondary | ICD-10-CM | POA: Diagnosis not present

## 2018-04-11 DIAGNOSIS — M6281 Muscle weakness (generalized): Secondary | ICD-10-CM | POA: Diagnosis not present

## 2018-04-11 DIAGNOSIS — R2681 Unsteadiness on feet: Secondary | ICD-10-CM | POA: Diagnosis not present

## 2018-04-15 DIAGNOSIS — M25561 Pain in right knee: Secondary | ICD-10-CM | POA: Diagnosis not present

## 2018-04-15 DIAGNOSIS — M6281 Muscle weakness (generalized): Secondary | ICD-10-CM | POA: Diagnosis not present

## 2018-04-15 DIAGNOSIS — R2681 Unsteadiness on feet: Secondary | ICD-10-CM | POA: Diagnosis not present

## 2018-04-16 DIAGNOSIS — M6281 Muscle weakness (generalized): Secondary | ICD-10-CM | POA: Diagnosis not present

## 2018-04-16 DIAGNOSIS — M25561 Pain in right knee: Secondary | ICD-10-CM | POA: Diagnosis not present

## 2018-04-16 DIAGNOSIS — R2681 Unsteadiness on feet: Secondary | ICD-10-CM | POA: Diagnosis not present

## 2018-04-17 DIAGNOSIS — R2681 Unsteadiness on feet: Secondary | ICD-10-CM | POA: Diagnosis not present

## 2018-04-17 DIAGNOSIS — M25561 Pain in right knee: Secondary | ICD-10-CM | POA: Diagnosis not present

## 2018-04-17 DIAGNOSIS — M6281 Muscle weakness (generalized): Secondary | ICD-10-CM | POA: Diagnosis not present

## 2018-04-30 DIAGNOSIS — Z6823 Body mass index (BMI) 23.0-23.9, adult: Secondary | ICD-10-CM | POA: Diagnosis not present

## 2018-04-30 DIAGNOSIS — M25561 Pain in right knee: Secondary | ICD-10-CM | POA: Diagnosis not present

## 2018-05-02 DIAGNOSIS — M1711 Unilateral primary osteoarthritis, right knee: Secondary | ICD-10-CM | POA: Diagnosis not present

## 2018-05-05 DIAGNOSIS — M545 Low back pain: Secondary | ICD-10-CM | POA: Diagnosis not present

## 2018-05-13 ENCOUNTER — Ambulatory Visit (INDEPENDENT_AMBULATORY_CARE_PROVIDER_SITE_OTHER): Payer: Medicare Other | Admitting: *Deleted

## 2018-05-13 DIAGNOSIS — I442 Atrioventricular block, complete: Secondary | ICD-10-CM | POA: Diagnosis not present

## 2018-05-14 ENCOUNTER — Telehealth: Payer: Self-pay

## 2018-05-14 NOTE — Telephone Encounter (Signed)
Left message for patient to remind of missed remote transmission.  

## 2018-05-16 LAB — CUP PACEART REMOTE DEVICE CHECK
Battery Remaining Longevity: 99 mo
Battery Remaining Percentage: 80 %
Brady Statistic AP VS Percent: 0 %
Brady Statistic AS VP Percent: 99 %
Brady Statistic AS VS Percent: 1 %
Brady Statistic RV Percent Paced: 99 %
Implantable Lead Implant Date: 20140703
Implantable Lead Implant Date: 20140703
Implantable Pulse Generator Implant Date: 20140703
Lead Channel Impedance Value: 450 Ohm
Lead Channel Pacing Threshold Amplitude: 0.75 V
Lead Channel Pacing Threshold Pulse Width: 0.4 ms
Lead Channel Sensing Intrinsic Amplitude: 1.5 mV
Lead Channel Sensing Intrinsic Amplitude: 12 mV
Lead Channel Setting Pacing Amplitude: 1 V
Lead Channel Setting Pacing Pulse Width: 0.4 ms
Lead Channel Setting Sensing Sensitivity: 12.5 mV
MDC IDC LEAD LOCATION: 753859
MDC IDC LEAD LOCATION: 753860
MDC IDC MSMT BATTERY VOLTAGE: 2.93 V
MDC IDC MSMT LEADCHNL RA PACING THRESHOLD AMPLITUDE: 0.5 V
MDC IDC MSMT LEADCHNL RA PACING THRESHOLD PULSEWIDTH: 0.4 ms
MDC IDC MSMT LEADCHNL RV IMPEDANCE VALUE: 430 Ohm
MDC IDC PG SERIAL: 7522008
MDC IDC SESS DTM: 20200302073134
MDC IDC SET LEADCHNL RA PACING AMPLITUDE: 2 V
MDC IDC STAT BRADY AP VP PERCENT: 1 %
MDC IDC STAT BRADY RA PERCENT PACED: 1 %
Pulse Gen Model: 2240

## 2018-05-20 ENCOUNTER — Encounter: Payer: Self-pay | Admitting: Cardiology

## 2018-05-20 NOTE — Progress Notes (Signed)
Remote pacemaker transmission.   

## 2018-05-22 DIAGNOSIS — R2681 Unsteadiness on feet: Secondary | ICD-10-CM | POA: Diagnosis not present

## 2018-05-22 DIAGNOSIS — M6281 Muscle weakness (generalized): Secondary | ICD-10-CM | POA: Diagnosis not present

## 2018-05-22 DIAGNOSIS — R278 Other lack of coordination: Secondary | ICD-10-CM | POA: Diagnosis not present

## 2018-05-23 DIAGNOSIS — M6281 Muscle weakness (generalized): Secondary | ICD-10-CM | POA: Diagnosis not present

## 2018-05-23 DIAGNOSIS — R2681 Unsteadiness on feet: Secondary | ICD-10-CM | POA: Diagnosis not present

## 2018-05-23 DIAGNOSIS — R278 Other lack of coordination: Secondary | ICD-10-CM | POA: Diagnosis not present

## 2018-05-26 DIAGNOSIS — R278 Other lack of coordination: Secondary | ICD-10-CM | POA: Diagnosis not present

## 2018-05-26 DIAGNOSIS — M6281 Muscle weakness (generalized): Secondary | ICD-10-CM | POA: Diagnosis not present

## 2018-05-26 DIAGNOSIS — R2681 Unsteadiness on feet: Secondary | ICD-10-CM | POA: Diagnosis not present

## 2018-05-28 DIAGNOSIS — R2681 Unsteadiness on feet: Secondary | ICD-10-CM | POA: Diagnosis not present

## 2018-05-28 DIAGNOSIS — R278 Other lack of coordination: Secondary | ICD-10-CM | POA: Diagnosis not present

## 2018-05-28 DIAGNOSIS — M6281 Muscle weakness (generalized): Secondary | ICD-10-CM | POA: Diagnosis not present

## 2018-05-30 DIAGNOSIS — M6281 Muscle weakness (generalized): Secondary | ICD-10-CM | POA: Diagnosis not present

## 2018-05-30 DIAGNOSIS — R2681 Unsteadiness on feet: Secondary | ICD-10-CM | POA: Diagnosis not present

## 2018-05-30 DIAGNOSIS — R278 Other lack of coordination: Secondary | ICD-10-CM | POA: Diagnosis not present

## 2018-06-02 DIAGNOSIS — R2681 Unsteadiness on feet: Secondary | ICD-10-CM | POA: Diagnosis not present

## 2018-06-02 DIAGNOSIS — M6281 Muscle weakness (generalized): Secondary | ICD-10-CM | POA: Diagnosis not present

## 2018-06-02 DIAGNOSIS — R278 Other lack of coordination: Secondary | ICD-10-CM | POA: Diagnosis not present

## 2018-06-04 DIAGNOSIS — R2681 Unsteadiness on feet: Secondary | ICD-10-CM | POA: Diagnosis not present

## 2018-06-04 DIAGNOSIS — R278 Other lack of coordination: Secondary | ICD-10-CM | POA: Diagnosis not present

## 2018-06-04 DIAGNOSIS — M6281 Muscle weakness (generalized): Secondary | ICD-10-CM | POA: Diagnosis not present

## 2018-06-05 DIAGNOSIS — R2681 Unsteadiness on feet: Secondary | ICD-10-CM | POA: Diagnosis not present

## 2018-06-05 DIAGNOSIS — M6281 Muscle weakness (generalized): Secondary | ICD-10-CM | POA: Diagnosis not present

## 2018-06-05 DIAGNOSIS — R278 Other lack of coordination: Secondary | ICD-10-CM | POA: Diagnosis not present

## 2018-06-09 DIAGNOSIS — M6281 Muscle weakness (generalized): Secondary | ICD-10-CM | POA: Diagnosis not present

## 2018-06-09 DIAGNOSIS — R2681 Unsteadiness on feet: Secondary | ICD-10-CM | POA: Diagnosis not present

## 2018-06-09 DIAGNOSIS — R278 Other lack of coordination: Secondary | ICD-10-CM | POA: Diagnosis not present

## 2018-06-11 DIAGNOSIS — R278 Other lack of coordination: Secondary | ICD-10-CM | POA: Diagnosis not present

## 2018-06-11 DIAGNOSIS — M6281 Muscle weakness (generalized): Secondary | ICD-10-CM | POA: Diagnosis not present

## 2018-06-11 DIAGNOSIS — R2681 Unsteadiness on feet: Secondary | ICD-10-CM | POA: Diagnosis not present

## 2018-06-13 DIAGNOSIS — R278 Other lack of coordination: Secondary | ICD-10-CM | POA: Diagnosis not present

## 2018-06-13 DIAGNOSIS — M6281 Muscle weakness (generalized): Secondary | ICD-10-CM | POA: Diagnosis not present

## 2018-06-13 DIAGNOSIS — R2681 Unsteadiness on feet: Secondary | ICD-10-CM | POA: Diagnosis not present

## 2018-06-16 ENCOUNTER — Telehealth: Payer: Self-pay | Admitting: Neurology

## 2018-06-16 DIAGNOSIS — R278 Other lack of coordination: Secondary | ICD-10-CM | POA: Diagnosis not present

## 2018-06-16 DIAGNOSIS — R2681 Unsteadiness on feet: Secondary | ICD-10-CM | POA: Diagnosis not present

## 2018-06-16 DIAGNOSIS — M6281 Muscle weakness (generalized): Secondary | ICD-10-CM | POA: Diagnosis not present

## 2018-06-16 NOTE — Telephone Encounter (Signed)
I have called the patient to make them aware that Dr. Krista Blue is not seeing injection patients through May due to the risk of COVID-19.  We will reschedule all of these patients for June. The patient did not answer so I left them a VM asking them to call back. Please make them aware of this new policy. Please make them aware I will call them back to get them rescheduled or If they are willing, you can reschedule them to June 2nd, this day is being held for these patients. Please also make them aware to call us back if they need anything before then. DW

## 2018-06-17 ENCOUNTER — Telehealth: Payer: Self-pay | Admitting: Neurology

## 2018-06-17 NOTE — Telephone Encounter (Signed)
06/17/2018 Called Patient left message to see if she wanted to sch. a sooner apt for Virtual visit Jayme Cloud

## 2018-06-20 DIAGNOSIS — R2681 Unsteadiness on feet: Secondary | ICD-10-CM | POA: Diagnosis not present

## 2018-06-20 DIAGNOSIS — R278 Other lack of coordination: Secondary | ICD-10-CM | POA: Diagnosis not present

## 2018-06-20 DIAGNOSIS — M6281 Muscle weakness (generalized): Secondary | ICD-10-CM | POA: Diagnosis not present

## 2018-06-23 ENCOUNTER — Telehealth: Payer: Self-pay | Admitting: Neurology

## 2018-06-23 DIAGNOSIS — R2681 Unsteadiness on feet: Secondary | ICD-10-CM | POA: Diagnosis not present

## 2018-06-23 DIAGNOSIS — M6281 Muscle weakness (generalized): Secondary | ICD-10-CM | POA: Diagnosis not present

## 2018-06-23 DIAGNOSIS — R278 Other lack of coordination: Secondary | ICD-10-CM | POA: Diagnosis not present

## 2018-06-23 NOTE — Telephone Encounter (Signed)
Called and spoke to patient's daughter [name]. Patient lives at [facility] and they are under quarantine. Patient's daughter will call back when she finds out more.

## 2018-06-25 DIAGNOSIS — R2681 Unsteadiness on feet: Secondary | ICD-10-CM | POA: Diagnosis not present

## 2018-06-25 DIAGNOSIS — R278 Other lack of coordination: Secondary | ICD-10-CM | POA: Diagnosis not present

## 2018-06-25 DIAGNOSIS — M6281 Muscle weakness (generalized): Secondary | ICD-10-CM | POA: Diagnosis not present

## 2018-06-27 DIAGNOSIS — M6281 Muscle weakness (generalized): Secondary | ICD-10-CM | POA: Diagnosis not present

## 2018-06-27 DIAGNOSIS — R2681 Unsteadiness on feet: Secondary | ICD-10-CM | POA: Diagnosis not present

## 2018-06-27 DIAGNOSIS — R278 Other lack of coordination: Secondary | ICD-10-CM | POA: Diagnosis not present

## 2018-06-30 ENCOUNTER — Ambulatory Visit: Payer: Medicare Other | Admitting: Neurology

## 2018-06-30 DIAGNOSIS — R2681 Unsteadiness on feet: Secondary | ICD-10-CM | POA: Diagnosis not present

## 2018-06-30 DIAGNOSIS — R278 Other lack of coordination: Secondary | ICD-10-CM | POA: Diagnosis not present

## 2018-06-30 DIAGNOSIS — M6281 Muscle weakness (generalized): Secondary | ICD-10-CM | POA: Diagnosis not present

## 2018-07-02 DIAGNOSIS — R278 Other lack of coordination: Secondary | ICD-10-CM | POA: Diagnosis not present

## 2018-07-02 DIAGNOSIS — R2681 Unsteadiness on feet: Secondary | ICD-10-CM | POA: Diagnosis not present

## 2018-07-02 DIAGNOSIS — M6281 Muscle weakness (generalized): Secondary | ICD-10-CM | POA: Diagnosis not present

## 2018-07-04 DIAGNOSIS — R278 Other lack of coordination: Secondary | ICD-10-CM | POA: Diagnosis not present

## 2018-07-04 DIAGNOSIS — M6281 Muscle weakness (generalized): Secondary | ICD-10-CM | POA: Diagnosis not present

## 2018-07-04 DIAGNOSIS — R2681 Unsteadiness on feet: Secondary | ICD-10-CM | POA: Diagnosis not present

## 2018-07-08 DIAGNOSIS — R278 Other lack of coordination: Secondary | ICD-10-CM | POA: Diagnosis not present

## 2018-07-08 DIAGNOSIS — R2681 Unsteadiness on feet: Secondary | ICD-10-CM | POA: Diagnosis not present

## 2018-07-08 DIAGNOSIS — M6281 Muscle weakness (generalized): Secondary | ICD-10-CM | POA: Diagnosis not present

## 2018-07-09 ENCOUNTER — Ambulatory Visit: Payer: Self-pay | Admitting: Neurology

## 2018-07-09 DIAGNOSIS — R2681 Unsteadiness on feet: Secondary | ICD-10-CM | POA: Diagnosis not present

## 2018-07-09 DIAGNOSIS — M6281 Muscle weakness (generalized): Secondary | ICD-10-CM | POA: Diagnosis not present

## 2018-07-09 DIAGNOSIS — R278 Other lack of coordination: Secondary | ICD-10-CM | POA: Diagnosis not present

## 2018-07-11 DIAGNOSIS — R278 Other lack of coordination: Secondary | ICD-10-CM | POA: Diagnosis not present

## 2018-07-11 DIAGNOSIS — M6281 Muscle weakness (generalized): Secondary | ICD-10-CM | POA: Diagnosis not present

## 2018-07-11 DIAGNOSIS — R2681 Unsteadiness on feet: Secondary | ICD-10-CM | POA: Diagnosis not present

## 2018-07-14 DIAGNOSIS — R278 Other lack of coordination: Secondary | ICD-10-CM | POA: Diagnosis not present

## 2018-07-14 DIAGNOSIS — R2681 Unsteadiness on feet: Secondary | ICD-10-CM | POA: Diagnosis not present

## 2018-07-14 DIAGNOSIS — M6281 Muscle weakness (generalized): Secondary | ICD-10-CM | POA: Diagnosis not present

## 2018-07-16 ENCOUNTER — Ambulatory Visit: Payer: Medicare Other | Admitting: Neurology

## 2018-07-16 DIAGNOSIS — R2681 Unsteadiness on feet: Secondary | ICD-10-CM | POA: Diagnosis not present

## 2018-07-16 DIAGNOSIS — M6281 Muscle weakness (generalized): Secondary | ICD-10-CM | POA: Diagnosis not present

## 2018-07-16 DIAGNOSIS — R278 Other lack of coordination: Secondary | ICD-10-CM | POA: Diagnosis not present

## 2018-07-18 DIAGNOSIS — R278 Other lack of coordination: Secondary | ICD-10-CM | POA: Diagnosis not present

## 2018-07-18 DIAGNOSIS — M6281 Muscle weakness (generalized): Secondary | ICD-10-CM | POA: Diagnosis not present

## 2018-07-18 DIAGNOSIS — R2681 Unsteadiness on feet: Secondary | ICD-10-CM | POA: Diagnosis not present

## 2018-07-21 DIAGNOSIS — M6281 Muscle weakness (generalized): Secondary | ICD-10-CM | POA: Diagnosis not present

## 2018-07-21 DIAGNOSIS — R278 Other lack of coordination: Secondary | ICD-10-CM | POA: Diagnosis not present

## 2018-07-21 DIAGNOSIS — R2681 Unsteadiness on feet: Secondary | ICD-10-CM | POA: Diagnosis not present

## 2018-07-23 DIAGNOSIS — R278 Other lack of coordination: Secondary | ICD-10-CM | POA: Diagnosis not present

## 2018-07-23 DIAGNOSIS — R2681 Unsteadiness on feet: Secondary | ICD-10-CM | POA: Diagnosis not present

## 2018-07-23 DIAGNOSIS — M6281 Muscle weakness (generalized): Secondary | ICD-10-CM | POA: Diagnosis not present

## 2018-07-25 DIAGNOSIS — R278 Other lack of coordination: Secondary | ICD-10-CM | POA: Diagnosis not present

## 2018-07-25 DIAGNOSIS — M6281 Muscle weakness (generalized): Secondary | ICD-10-CM | POA: Diagnosis not present

## 2018-07-25 DIAGNOSIS — R2681 Unsteadiness on feet: Secondary | ICD-10-CM | POA: Diagnosis not present

## 2018-07-27 DIAGNOSIS — R278 Other lack of coordination: Secondary | ICD-10-CM | POA: Diagnosis not present

## 2018-07-27 DIAGNOSIS — R2681 Unsteadiness on feet: Secondary | ICD-10-CM | POA: Diagnosis not present

## 2018-07-27 DIAGNOSIS — M6281 Muscle weakness (generalized): Secondary | ICD-10-CM | POA: Diagnosis not present

## 2018-07-28 DIAGNOSIS — R2681 Unsteadiness on feet: Secondary | ICD-10-CM | POA: Diagnosis not present

## 2018-07-28 DIAGNOSIS — M6281 Muscle weakness (generalized): Secondary | ICD-10-CM | POA: Diagnosis not present

## 2018-07-28 DIAGNOSIS — R278 Other lack of coordination: Secondary | ICD-10-CM | POA: Diagnosis not present

## 2018-07-30 DIAGNOSIS — R2681 Unsteadiness on feet: Secondary | ICD-10-CM | POA: Diagnosis not present

## 2018-07-30 DIAGNOSIS — R278 Other lack of coordination: Secondary | ICD-10-CM | POA: Diagnosis not present

## 2018-07-30 DIAGNOSIS — M6281 Muscle weakness (generalized): Secondary | ICD-10-CM | POA: Diagnosis not present

## 2018-08-06 ENCOUNTER — Telehealth: Payer: Self-pay | Admitting: Neurology

## 2018-08-06 DIAGNOSIS — M25561 Pain in right knee: Secondary | ICD-10-CM | POA: Diagnosis not present

## 2018-08-06 DIAGNOSIS — M1711 Unilateral primary osteoarthritis, right knee: Secondary | ICD-10-CM | POA: Diagnosis not present

## 2018-08-06 NOTE — Telephone Encounter (Signed)
I called the patient Wendy Velasquez and I spoke with Riza V. Who stated that the patients insurance followed Medicare guidelines and NPR. DW

## 2018-08-07 NOTE — Telephone Encounter (Signed)
I caleld and spoke with the patients daughter to go over check in details.

## 2018-08-12 ENCOUNTER — Encounter: Payer: Self-pay | Admitting: Neurology

## 2018-08-12 ENCOUNTER — Ambulatory Visit: Payer: Medicare Other | Admitting: Neurology

## 2018-08-12 ENCOUNTER — Other Ambulatory Visit: Payer: Self-pay

## 2018-08-12 ENCOUNTER — Ambulatory Visit (INDEPENDENT_AMBULATORY_CARE_PROVIDER_SITE_OTHER): Payer: Medicare Other | Admitting: *Deleted

## 2018-08-12 ENCOUNTER — Ambulatory Visit (INDEPENDENT_AMBULATORY_CARE_PROVIDER_SITE_OTHER): Payer: Medicare Other | Admitting: Neurology

## 2018-08-12 VITALS — BP 136/66 | HR 79 | Temp 98.2°F | Ht 64.0 in | Wt 126.0 lb

## 2018-08-12 DIAGNOSIS — I442 Atrioventricular block, complete: Secondary | ICD-10-CM

## 2018-08-12 DIAGNOSIS — G243 Spasmodic torticollis: Secondary | ICD-10-CM | POA: Diagnosis not present

## 2018-08-12 LAB — CUP PACEART REMOTE DEVICE CHECK
Date Time Interrogation Session: 20200602191038
Implantable Lead Implant Date: 20140703
Implantable Lead Implant Date: 20140703
Implantable Lead Location: 753859
Implantable Lead Location: 753860
Implantable Pulse Generator Implant Date: 20140703
Pulse Gen Model: 2240
Pulse Gen Serial Number: 7522008

## 2018-08-12 MED ORDER — ONABOTULINUMTOXINA 100 UNITS IJ SOLR
100.0000 [IU] | Freq: Once | INTRAMUSCULAR | Status: AC
  Administered 2018-08-12: 100 [IU] via INTRAMUSCULAR

## 2018-08-12 NOTE — Progress Notes (Signed)
PATIENT: Wendy Velasquez DOB: October 14, 1925  Chief Complaint  Patient presents with  . Cervical Dystonia    Botox 100 units x 1 vial - office supply     HISTORICAL  Wendy Velasquez is a 85 years old female, seen in request by her primary care physician Dr. Osborne Casco, Frans[place] Him for evaluation of neck pain, headaches, initial evaluation was on December 05, 2017.  She is accompanied by her daughter at today's clinical visit.  I have reviewed and summarized the referring note from the referring physician, she has past medical history of diabetes, use insulin, history of cardiac block, status post pacemaker, hyperlipidemia,  She had a history of chronic neck pain, had acute worsening since March 2018, she woke up one morning, noticed upper neck throbbing pain, radiating pain to her skull, has been persistent since then, over the last 1 year, she was seen by orthopedic PA, Ronette Deter, was treated with multiple trigger point injection, with mixture of 1% lidocaine, and Depo-Medrol, most recent injection was on October 10, 2017, on a monthly basis, patient denies significant improvement, also complains of worsening glucose control after injection,  She denies significant radiating pain to bilateral shoulder upper extremity, denies difficulty using arms, she had chronic urinary incontinence is receiving Botox injection by urologist, she contributed to her gait abnormality to her bilateral knee pain, chronic arthritis,  She now complains of difficulty turning her neck, 6 out of 10 constant deep achy pain, multiple tender spots at cervical, upper trapezius, oftentimes she has transient sharp radiating range of motion, difficulty turning her neck,  She also tried physical therapy, is doing morning neck traction regularly,  UPDATE Jan 02 2018: She continue complains of significant neck pain, 8 out of 10 sometimes, radiating pain to bilateral occipital region despite gabapentin 100 mg 5 tablets each day,  I have personally reviewed CT cervical spine on December 10, 2017, no acute abnormality, multilevel spondylolisthesis, maximum at C4-5, with associated   facet arthropathy, evidence of ankylosis, mild spinal stenosis at the C2-3, C3-4 level  UPDATE Apr 08 2018: She responded very well to her initial injection January 02, 2018, we used 100 units of Botox, a month after injection she was pain-free for 2 months, there was no significant side effect noticed, in the past 2 weeks, she noticed returning of her neck pain, centered at bilateral mastoid process, nuchal line, tenderness upon deep palpitation.  UPDATE August 12 2018: She responded very well to previous injection   REVIEW OF SYSTEMS: Full 14 system review of systems performed and notable only for as above. ALLERGIES: No Known Allergies  HOME MEDICATIONS: Current Outpatient Medications  Medication Sig Dispense Refill  . acetaminophen (TYLENOL) 500 MG tablet Take 1,000 mg by mouth every 6 (six) hours as needed for fever.    Marland Kitchen albuterol (PROVENTIL) (2.5 MG/3ML) 0.083% nebulizer solution Take 2.5 mg by nebulization every 6 (six) hours as needed for wheezing or shortness of breath.    Marland Kitchen albuterol-ipratropium (COMBIVENT) 18-103 MCG/ACT inhaler Inhale 2 puffs into the lungs every 6 (six) hours as needed for wheezing.    Marland Kitchen aspirin EC 81 MG tablet Take 81 mg by mouth daily.    . Brinzolamide-Brimonidine (SIMBRINZA) 1-0.2 % SUSP Apply 1 drop to eye at bedtime.    . cetirizine (ZYRTEC) 10 MG tablet Take 10 mg by mouth daily.    . Esomeprazole Magnesium (NEXIUM PO) Take 1 tablet by mouth daily.    . fluticasone (FLONASE) 50 MCG/ACT  nasal spray Place 2 sprays into both nostrils as needed. SHAKE AS DIRECTED     . Fluticasone-Salmeterol (ADVAIR DISKUS) 250-50 MCG/DOSE AEPB Inhale 1 puff into the lungs 2 (two) times daily.    Marland Kitchen gabapentin (NEURONTIN) 100 MG capsule Take 2 capsules in am, 2 capsules midday, 1 capsule at bedtime. 450 capsule 3  . ibandronate  (BONIVA) 150 MG tablet Take 150 mg by mouth every 30 (thirty) days. Take in the morning with a full glass of water, on an empty stomach, and do not take anything else by mouth or lie down for the next 30 min.    . insulin glargine (LANTUS) 100 UNIT/ML injection Inject 12 Units into the skin at bedtime. 9 pm daily    . metFORMIN (GLUCOPHAGE) 1000 MG tablet Take 1,000 mg by mouth 2 (two) times daily with a meal.    . MYRBETRIQ 25 MG TB24 tablet Take 25 mg by mouth daily.   6  . sertraline (ZOLOFT) 50 MG tablet Take 50 mg by mouth 2 (two) times daily.    Marland Kitchen trimethoprim (TRIMPEX) 100 MG tablet Take 1 tablet (100 mg total) by mouth daily. OK TO RESUME AFTER YOU FINISH CURRENT ANTIBIOTIC THERAPY.    . vitamin B-12 (CYANOCOBALAMIN) 1000 MCG tablet Take 1,000 mcg by mouth daily.     No current facility-administered medications for this visit.     PAST MEDICAL HISTORY: Past Medical History:  Diagnosis Date  . Asthma   . Cardiac pacemaker in situ   . Complete heart block ([place])   . COPD (chronic obstructive pulmonary disease) ([place])   . Diverticulosis of colon   . GERD (gastroesophageal reflux disease)   . Glaucoma of both eyes   . Heart disease   . History of cardiac arrest    09-11-2012--  symptomatic complete heart block -- hr 20's--  cpr, intubated and temp. pacer until  perm. pacemaker placement  . History of esophageal dilatation    for stricture 2007  . History of hiatal hernia   . History of iron deficiency anemia    hx transfusion's 15 yrs ago , approx 2001  . Hyperlipidemia   . Hypertension   . Neck pain   . Peripheral neuropathy   . Tachy-brady syndrome ([place])   . Type 2 diabetes mellitus ([place])   . Urgency incontinence    Botox injections for bladder.  . Wears glasses     PAST SURGICAL HISTORY: Past Surgical History:  Procedure Laterality Date  . ABDOMINAL HYSTERECTOMY  1976  approx   w/ unilateral salpingoophorectomy  . CATARACT EXTRACTION W/ INTRAOCULAR LENS  IMPLANT,  BILATERAL  1990  . COLONOSCOPY WITH ESOPHAGOGASTRODUODENOSCOPY (EGD)  last one 2007  . INTERSTIM IMPLANT PLACEMENT  2012  . INTERSTIM IMPLANT REMOVAL N/A 09/21/2014   Procedure: REMOVAL OF INTERSTIM IMPLANT;  Surgeon: Bjorn Loser, MD;  Location[facility];  Service: Urology;  Laterality: N/A;  . INTERSTIM IMPLANT REVISION N/A 09/21/2014   Procedure: Barrie Lyme STAGE ONE AND TWO;  Surgeon: Bjorn Loser, MD;  Location: [facility];  Service: Urology;  Laterality: N/A;  . LAPAROTOMY W/ UNILATERAL SALPINGOOPHORECTOMY  1960's  approx  . PERMANENT PACEMAKER INSERTION N/A 09/11/2012   Procedure: PERMANENT PACEMAKER INSERTION;  Surgeon: Evans Lance, MD;  [facility] C[facility] LAB;  Service: Cardiovascular;  Laterality: N/A;  St. Jude Dual Chamber  . RECTOCELE REPAIR  1991  approx    FAMILY HISTORY: Family History  Problem Relation Age of Onset  .  Other Mother        died during her childbirth  . Prostatitis Father   . Heart failure Sister   . Breast cancer Sister   . Diabetic kidney disease Sister   . Melanoma Brother     SOCIAL HISTORY: Social History   Socioeconomic History  . Marital status: Married    Spouse name: Not on file  . Number of children: 3  . Years of education: 18  . Highest education level: High school graduate  Occupational History  . Occupation: Retired  Scientific laboratory technician  . Financial resource strain: Not on file  . Food insecurity:    Worry: Not on file    Inability: Not on file  . Transportation needs:    Medical: Not on file    Non-medical: Not on file  Tobacco Use  . Smoking status: Never Smoker  . Smokeless tobacco: Never Used  Substance and Sexual Activity  . Alcohol use: No  . Drug use: No  . Sexual activity: Not on file  Lifestyle  . Physical activity:    Days per week: Not on file    Minutes per session: Not on file  . Stress: Not on file  Relationships  . Social connections:    Talks on phone: Not on file     Gets together: Not on file    Attends religious service: Not on file    Active member of club or organization: Not on file    Attends meetings of clubs or organizations: Not on file    Relationship status: Not on file  . Intimate partner violence:    Fear of current or ex partner: Not on file    Emotionally abused: Not on file    Physically abused: Not on file    Forced sexual activity: Not on file  Other Topics Concern  . Not on file  Social History Narrative   Lives with her husband in an independent living facility.   Right-handed.   1-2 cup caffeine daily.     PHYSICAL EXAM   There is no height or weight on file to calculate BMI.    CRANIAL NERVES: CN II: Visual fields are full to confrontation.  Pupils are round equal and briskly reactive to light. CN III, IV, VI: extraocular movement are normal. No ptosis. CN V: Facial sensation is intact to pinprick in all 3 divisions bilaterally. Corneal responses are intact.  CN VII: Face is symmetric with normal eye closure and smile. CN VIII: Hearing is normal to rubbing fingers CN IX, X: Palate elevates symmetrically. Phonation is normal. CN XI: Head turning and shoulder shrug are intact CN XII: Tongue is midline with normal movements and no atrophy.  MOTOR: She has limited range of motion, difficulty turning her neck to the extreme right or left,  REFLEXES: Reflexes are hypoactive and symmetric at the biceps, triceps, knees, and ankles. Plantar responses are flexor.  SENSORY: Intact to light touch, pinprick, positional sensation and vibratory sensation are intact in fingers and toes.  COORDINATION: Rapid alternating movements and fine finger movements are intact. There is no dysmetria on finger-to-nose and heel-knee-shin.    GAIT/STANCE: She needs pushed up to get up from seated position, cautious, mildly unsteady   DIAGNOSTIC DATA (LABS, IMAGING, TESTING) - I reviewed patient records, labs, notes, testing and imaging  myself where available.   ASSESSMENT AND PLAN  Wendy Velasquez is a 85 y.o. female    Chronic neck pain, headaches Abnormal neck posturing,  mild retrocollis, left tilt, significant tenderness at bilateral mastoid process  Consistent with upper cervical degenerative disc disease  CT cervical showed evidence of multilevel spondylolisthesis, maximum at C4-5 with associated facet arthropathy, developing ankylosis  She had tried and failed multiple treatment in the past, including neck traction, heating pad, trigger point injection, gabapentin, responded very well to EMG guided Botox injection, initial injection was on January 02, 2018,  Repeat EMG guided Botox injection Used 100 units of Botox A    Right longissimus capitis 25 units Right splenius cervix 12.5 Right semispinalis 12.5 units  Left longissimus capitis 25 units Left splenius cervix 12.5 units Left semispinalis 12.5 units   Preauthorization for BOTOX 200 units, will only use 100 units at next injection   Marcial Pacas, M.D. Ph.D.  [facility] [address] [place], [place] [address] Ph: [phone] Fax: [phone]

## 2018-08-12 NOTE — Progress Notes (Signed)
**  Botox 100 units x 1 vial, NDC 0023-1145-01, Lot [id number], Exp 04/2021, office supply.//mck,rn** 

## 2018-08-20 DIAGNOSIS — R2681 Unsteadiness on feet: Secondary | ICD-10-CM | POA: Diagnosis not present

## 2018-08-20 DIAGNOSIS — R278 Other lack of coordination: Secondary | ICD-10-CM | POA: Diagnosis not present

## 2018-08-20 DIAGNOSIS — M6281 Muscle weakness (generalized): Secondary | ICD-10-CM | POA: Diagnosis not present

## 2018-08-21 ENCOUNTER — Encounter: Payer: Self-pay | Admitting: Cardiology

## 2018-08-21 NOTE — Progress Notes (Signed)
Remote pacemaker transmission.   

## 2018-08-25 ENCOUNTER — Institutional Professional Consult (permissible substitution): Payer: Medicare Other | Admitting: Neurology

## 2018-09-17 DIAGNOSIS — E538 Deficiency of other specified B group vitamins: Secondary | ICD-10-CM | POA: Diagnosis not present

## 2018-09-17 DIAGNOSIS — E559 Vitamin D deficiency, unspecified: Secondary | ICD-10-CM | POA: Diagnosis not present

## 2018-09-17 DIAGNOSIS — E7800 Pure hypercholesterolemia, unspecified: Secondary | ICD-10-CM | POA: Diagnosis not present

## 2018-09-17 DIAGNOSIS — M4302 Spondylolysis, cervical region: Secondary | ICD-10-CM | POA: Diagnosis not present

## 2018-09-17 DIAGNOSIS — I129 Hypertensive chronic kidney disease with stage 1 through stage 4 chronic kidney disease, or unspecified chronic kidney disease: Secondary | ICD-10-CM | POA: Diagnosis not present

## 2018-09-17 DIAGNOSIS — J4550 Severe persistent asthma, uncomplicated: Secondary | ICD-10-CM | POA: Diagnosis not present

## 2018-09-17 DIAGNOSIS — E1129 Type 2 diabetes mellitus with other diabetic kidney complication: Secondary | ICD-10-CM | POA: Diagnosis not present

## 2018-09-17 DIAGNOSIS — M810 Age-related osteoporosis without current pathological fracture: Secondary | ICD-10-CM | POA: Diagnosis not present

## 2018-09-17 DIAGNOSIS — N183 Chronic kidney disease, stage 3 (moderate): Secondary | ICD-10-CM | POA: Diagnosis not present

## 2018-09-17 DIAGNOSIS — R809 Proteinuria, unspecified: Secondary | ICD-10-CM | POA: Diagnosis not present

## 2018-09-17 DIAGNOSIS — D508 Other iron deficiency anemias: Secondary | ICD-10-CM | POA: Diagnosis not present

## 2018-09-22 DIAGNOSIS — N183 Chronic kidney disease, stage 3 (moderate): Secondary | ICD-10-CM | POA: Diagnosis not present

## 2018-09-22 DIAGNOSIS — E559 Vitamin D deficiency, unspecified: Secondary | ICD-10-CM | POA: Diagnosis not present

## 2018-09-22 DIAGNOSIS — E538 Deficiency of other specified B group vitamins: Secondary | ICD-10-CM | POA: Diagnosis not present

## 2018-09-22 DIAGNOSIS — E1129 Type 2 diabetes mellitus with other diabetic kidney complication: Secondary | ICD-10-CM | POA: Diagnosis not present

## 2018-09-22 DIAGNOSIS — D508 Other iron deficiency anemias: Secondary | ICD-10-CM | POA: Diagnosis not present

## 2018-10-29 DIAGNOSIS — M1711 Unilateral primary osteoarthritis, right knee: Secondary | ICD-10-CM | POA: Diagnosis not present

## 2018-10-29 DIAGNOSIS — M25561 Pain in right knee: Secondary | ICD-10-CM | POA: Diagnosis not present

## 2018-11-11 ENCOUNTER — Ambulatory Visit (INDEPENDENT_AMBULATORY_CARE_PROVIDER_SITE_OTHER): Payer: Medicare Other | Admitting: *Deleted

## 2018-11-11 DIAGNOSIS — I442 Atrioventricular block, complete: Secondary | ICD-10-CM

## 2018-11-11 DIAGNOSIS — I469 Cardiac arrest, cause unspecified: Secondary | ICD-10-CM

## 2018-11-11 LAB — CUP PACEART REMOTE DEVICE CHECK
Battery Remaining Longevity: 85 mo
Battery Remaining Percentage: 72 %
Battery Voltage: 2.92 V
Brady Statistic AP VP Percent: 1 %
Brady Statistic AP VS Percent: 1 %
Brady Statistic AS VP Percent: 99 %
Brady Statistic AS VS Percent: 1 %
Brady Statistic RA Percent Paced: 1 %
Brady Statistic RV Percent Paced: 99 %
Date Time Interrogation Session: 20200831060015
Implantable Lead Implant Date: 20140703
Implantable Lead Implant Date: 20140703
Implantable Lead Location: 753859
Implantable Lead Location: 753860
Implantable Pulse Generator Implant Date: 20140703
Lead Channel Impedance Value: 440 Ohm
Lead Channel Impedance Value: 440 Ohm
Lead Channel Pacing Threshold Amplitude: 0.5 V
Lead Channel Pacing Threshold Amplitude: 1.25 V
Lead Channel Pacing Threshold Pulse Width: 0.4 ms
Lead Channel Pacing Threshold Pulse Width: 0.4 ms
Lead Channel Sensing Intrinsic Amplitude: 1.4 mV
Lead Channel Sensing Intrinsic Amplitude: 12 mV
Lead Channel Setting Pacing Amplitude: 1.5 V
Lead Channel Setting Pacing Amplitude: 2 V
Lead Channel Setting Pacing Pulse Width: 0.4 ms
Lead Channel Setting Sensing Sensitivity: 12.5 mV
Pulse Gen Model: 2240
Pulse Gen Serial Number: 7522008

## 2018-11-12 DIAGNOSIS — N3946 Mixed incontinence: Secondary | ICD-10-CM | POA: Diagnosis not present

## 2018-11-12 DIAGNOSIS — N3020 Other chronic cystitis without hematuria: Secondary | ICD-10-CM | POA: Diagnosis not present

## 2018-11-13 DIAGNOSIS — M179 Osteoarthritis of knee, unspecified: Secondary | ICD-10-CM | POA: Diagnosis not present

## 2018-11-13 DIAGNOSIS — R2689 Other abnormalities of gait and mobility: Secondary | ICD-10-CM | POA: Diagnosis not present

## 2018-11-19 ENCOUNTER — Other Ambulatory Visit: Payer: Self-pay

## 2018-11-19 ENCOUNTER — Ambulatory Visit (INDEPENDENT_AMBULATORY_CARE_PROVIDER_SITE_OTHER): Payer: Medicare Other | Admitting: Neurology

## 2018-11-19 ENCOUNTER — Encounter: Payer: Self-pay | Admitting: Neurology

## 2018-11-19 VITALS — BP 162/79 | HR 83 | Temp 97.8°F | Ht 64.0 in | Wt 135.0 lb

## 2018-11-19 DIAGNOSIS — M542 Cervicalgia: Secondary | ICD-10-CM | POA: Diagnosis not present

## 2018-11-19 DIAGNOSIS — G243 Spasmodic torticollis: Secondary | ICD-10-CM

## 2018-11-19 MED ORDER — ONABOTULINUMTOXINA 100 UNITS IJ SOLR
100.0000 [IU] | Freq: Once | INTRAMUSCULAR | Status: AC
  Administered 2018-11-19: 100 [IU] via INTRAMUSCULAR

## 2018-11-19 NOTE — Progress Notes (Signed)
PATIENT: Wendy Velasquez DOB: [birth year]  Chief Complaint  Patient presents with  . Cervical Dystonia    Botox 100 units x 1 vial - office supply     HISTORICAL  Wendy RUDZIK is a 85 years old female, seen in request by her primary care physician Dr. Osborne Casco, [place] Him for evaluation of neck pain, headaches, initial evaluation was on December 05, 2017.  She is accompanied by her daughter at today's clinical visit.  I have reviewed and summarized the referring note from the referring physician, she has past medical history of diabetes, use insulin, history of cardiac block, status post pacemaker, hyperlipidemia,  She had a history of chronic neck pain, had acute worsening since March 2018, she woke up one morning, noticed upper neck throbbing pain, radiating pain to her skull, has been persistent since then, over the last 1 year, she was seen by orthopedic PA, Ronette Deter, was treated with multiple trigger point injection, with mixture of 1% lidocaine, and Depo-Medrol, most recent injection was on October 10, 2017, on a monthly basis, patient denies significant improvement, also complains of worsening glucose control after injection,  She denies significant radiating pain to bilateral shoulder upper extremity, denies difficulty using arms, she had chronic urinary incontinence is receiving Botox injection by urologist, she contributed to her gait abnormality to her bilateral knee pain, chronic arthritis,  She now complains of difficulty turning her neck, 6 out of 10 constant deep achy pain, multiple tender spots at cervical, upper trapezius, oftentimes she has transient sharp radiating range of motion, difficulty turning her neck,  She also tried physical therapy, is doing morning neck traction regularly,  UPDATE Jan 02 2018: She continue complains of significant neck pain, 8 out of 10 sometimes, radiating pain to bilateral occipital region despite gabapentin 100 mg 5 tablets each day,  I have personally reviewed CT cervical spine on December 10, 2017, no acute abnormality, multilevel spondylolisthesis, maximum at C4-5, with associated   facet arthropathy, evidence of ankylosis, mild spinal stenosis at the C2-3, C3-4 level  UPDATE Apr 08 2018: She responded very well to her initial injection January 02, 2018, we used 100 units of Botox, a month after injection she was pain-free for 2 months, there was no significant side effect noticed, in the past 2 weeks, she noticed returning of her neck pain, centered at bilateral mastoid process, nuchal line, tenderness upon deep palpitation.  UPDATE August 12 2018: She responded very well to previous injection  UPDATE Sept 9 2020: She responded well to previous injection, we used Botox a 100 units, is take about 3 days for the benefit to kicking, lasting for 2 to 3 months, no significant side effect.   REVIEW OF SYSTEMS: Full 14 system review of systems performed and notable only for as above. ALLERGIES: No Known Allergies  HOME MEDICATIONS: Current Outpatient Medications  Medication Sig Dispense Refill  . acetaminophen (TYLENOL) 500 MG tablet Take 1,000 mg by mouth every 6 (six) hours as needed for fever.    Marland Kitchen albuterol (PROVENTIL) (2.5 MG/3ML) 0.083% nebulizer solution Take 2.5 mg by nebulization every 6 (six) hours as needed for wheezing or shortness of breath.    Marland Kitchen albuterol-ipratropium (COMBIVENT) 18-103 MCG/ACT inhaler Inhale 2 puffs into the lungs every 6 (six) hours as needed for wheezing.    Marland Kitchen aspirin EC 81 MG tablet Take 81 mg by mouth daily.    . botulinum toxin Type A (BOTOX) 100 units SOLR injection Inject 100 Units  into the muscle every 3 (three) months.    . Brinzolamide-Brimonidine (SIMBRINZA) 1-0.2 % SUSP Apply 1 drop to eye at bedtime.    . cetirizine (ZYRTEC) 10 MG tablet Take 10 mg by mouth daily.    . Esomeprazole Magnesium (NEXIUM PO) Take 1 tablet by mouth daily.    . fluticasone (FLONASE) 50 MCG/ACT nasal spray  Place 2 sprays into both nostrils as needed. SHAKE AS DIRECTED     . Fluticasone-Salmeterol (ADVAIR DISKUS) 250-50 MCG/DOSE AEPB Inhale 1 puff into the lungs 2 (two) times daily.    Marland Kitchen gabapentin (NEURONTIN) 100 MG capsule Take 2 capsules in am, 2 capsules midday, 1 capsule at bedtime. 450 capsule 3  . ibandronate (BONIVA) 150 MG tablet Take 150 mg by mouth every 30 (thirty) days. Take in the morning with a full glass of water, on an empty stomach, and do not take anything else by mouth or lie down for the next 30 min.    . insulin glargine (LANTUS) 100 UNIT/ML injection Inject 12 Units into the skin at bedtime. 9 pm daily    . metFORMIN (GLUCOPHAGE) 1000 MG tablet Take 1,000 mg by mouth 2 (two) times daily with a meal.    . MYRBETRIQ 25 MG TB24 tablet Take 25 mg by mouth daily.   6  . sertraline (ZOLOFT) 50 MG tablet Take 50 mg by mouth 2 (two) times daily.    Marland Kitchen trimethoprim (TRIMPEX) 100 MG tablet Take 1 tablet (100 mg total) by mouth daily. OK TO RESUME AFTER YOU FINISH CURRENT ANTIBIOTIC THERAPY.    . vitamin B-12 (CYANOCOBALAMIN) 1000 MCG tablet Take 1,000 mcg by mouth daily.     No current facility-administered medications for this visit.     PAST MEDICAL HISTORY: Past Medical History:  Diagnosis Date  . Asthma   . Cardiac pacemaker in situ   . Complete heart block ([place])   . COPD (chronic obstructive pulmonary disease) ([place])   . Diverticulosis of colon   . GERD (gastroesophageal reflux disease)   . Glaucoma of both eyes   . Heart disease   . History of cardiac arrest    09-11-2012--  symptomatic complete heart block -- hr 20's--  cpr, intubated and temp. pacer until  perm. pacemaker placement  . History of esophageal dilatation    for stricture 2007  . History of hiatal hernia   . History of iron deficiency anemia    hx transfusion's 15 yrs ago , approx 2001  . Hyperlipidemia   . Hypertension   . Neck pain   . Peripheral neuropathy   . Tachy-brady syndrome ([place])   . Type 2  diabetes mellitus ([place])   . Urgency incontinence    Botox injections for bladder.  . Wears glasses     PAST SURGICAL HISTORY: Past Surgical History:  Procedure Laterality Date  . ABDOMINAL HYSTERECTOMY  1976  approx   w/ unilateral salpingoophorectomy  . CATARACT EXTRACTION W/ INTRAOCULAR LENS  IMPLANT, BILATERAL  1990  . COLONOSCOPY WITH ESOPHAGOGASTRODUODENOSCOPY (EGD)  last one 2007  . INTERSTIM IMPLANT PLACEMENT  2012  . INTERSTIM IMPLANT REMOVAL N/A 09/21/2014   Procedure: REMOVAL OF INTERSTIM IMPLANT;  Surgeon: Bjorn Loser, MD;  Location[facility];  Service: Urology;  Laterality: N/A;  . INTERSTIM IMPLANT REVISION N/A 09/21/2014   Procedure: Barrie Lyme STAGE ONE AND TWO;  Surgeon: Bjorn Loser, MD;  Location[facility];  Service: Urology;  Laterality: N/A;  . LAPAROTOMY W/ UNILATERAL SALPINGOOPHORECTOMY  1960's  approx  . PERMANENT PACEMAKER INSERTION N/A 09/11/2012   Procedure: PERMANENT PACEMAKER INSERTION;  Surgeon: Evans Lance, MD;  Location: [facility] CATH LAB;  Service: Cardiovascular;  Laterality: N/A;  St. Jude Dual Chamber  . RECTOCELE REPAIR  1991  approx    FAMILY HISTORY: Family History  Problem Relation Age of Onset  . Other Mother        died during her childbirth  . Prostatitis Father   . Heart failure Sister   . Breast cancer Sister   . Diabetic kidney disease Sister   . Melanoma Brother     SOCIAL HISTORY: Social History   Socioeconomic History  . Marital status: Married    Spouse name: Not on file  . Number of children: 3  . Years of education: 35  . Highest education level: High school graduate  Occupational History  . Occupation: Retired  Scientific laboratory technician  . Financial resource strain: Not on file  . Food insecurity    Worry: Not on file    Inability: Not on file  . Transportation needs    Medical: Not on file    Non-medical: Not on file  Tobacco Use  . Smoking status: Never Smoker  . Smokeless tobacco:  Never Used  Substance and Sexual Activity  . Alcohol use: No  . Drug use: No  . Sexual activity: Not on file  Lifestyle  . Physical activity    Days per week: Not on file    Minutes per session: Not on file  . Stress: Not on file  Relationships  . Social Herbalist on phone: Not on file    Gets together: Not on file    Attends religious service: Not on file    Active member of club or organization: Not on file    Attends meetings of clubs or organizations: Not on file    Relationship status: Not on file  . Intimate partner violence    Fear of current or ex partner: Not on file    Emotionally abused: Not on file    Physically abused: Not on file    Forced sexual activity: Not on file  Other Topics Concern  . Not on file  Social History Narrative   Lives with her husband in an independent living facility.   Right-handed.   1-2 cup caffeine daily.     PHYSICAL EXAM   Body mass index is 23.17 kg/m.   DIAGNOSTIC DATA (LABS, IMAGING, TESTING) - I reviewed patient records, labs, notes, testing and imaging myself where available.   ASSESSMENT AND PLAN  JENNIVER PURCELL is a 85 y.o. female    Chronic neck pain, headaches Abnormal neck posturing, mild retrocollis, left tilt, significant tenderness at bilateral lateral cervical paraspinal muscles, bilateral levator scapular  Consistent with upper cervical degenerative disc disease  CT cervical showed evidence of multilevel spondylolisthesis, maximum at C4-5 with associated facet arthropathy, developing ankylosis  She had tried and failed multiple treatment in the past, including neck traction, heating pad, trigger point injection, gabapentin, responded very well to EMG guided Botox injection, initial injection was on January 02, 2018,  Repeat EMG guided Botox injection Used 100 units of Botox A    Right longissimus capitis 12.5 units Right splenius cervix 12.5 Right semispinalis 12.5 units Right levator  scapular 12.5 units  Left longissimus capitis 25 units Left splenius cervix 12.5 units Left semispinalis 12.5 units Left levator scapular 12.5 units   Marcial Pacas, M.D.  Ph.D.  [facility] [address] [place], [place] [address] Ph: [phone] Fax: [phone]

## 2018-11-19 NOTE — Progress Notes (Signed)
**  Botox 100 units x 1 vial, NDC [id number], Lot [id number], Exp 04/2021, office supply.//mck,rn**

## 2018-11-27 DIAGNOSIS — H52223 Regular astigmatism, bilateral: Secondary | ICD-10-CM | POA: Diagnosis not present

## 2018-11-27 DIAGNOSIS — H524 Presbyopia: Secondary | ICD-10-CM | POA: Diagnosis not present

## 2018-11-27 DIAGNOSIS — H5212 Myopia, left eye: Secondary | ICD-10-CM | POA: Diagnosis not present

## 2018-11-27 DIAGNOSIS — E119 Type 2 diabetes mellitus without complications: Secondary | ICD-10-CM | POA: Diagnosis not present

## 2018-11-27 DIAGNOSIS — H353123 Nonexudative age-related macular degeneration, left eye, advanced atrophic without subfoveal involvement: Secondary | ICD-10-CM | POA: Diagnosis not present

## 2018-11-27 NOTE — Progress Notes (Signed)
Remote pacemaker transmission.   

## 2018-12-11 DIAGNOSIS — Z23 Encounter for immunization: Secondary | ICD-10-CM | POA: Diagnosis not present

## 2018-12-12 DIAGNOSIS — N3946 Mixed incontinence: Secondary | ICD-10-CM | POA: Diagnosis not present

## 2018-12-12 DIAGNOSIS — R350 Frequency of micturition: Secondary | ICD-10-CM | POA: Diagnosis not present

## 2018-12-25 DIAGNOSIS — N3946 Mixed incontinence: Secondary | ICD-10-CM | POA: Diagnosis not present

## 2018-12-25 DIAGNOSIS — N3020 Other chronic cystitis without hematuria: Secondary | ICD-10-CM | POA: Diagnosis not present

## 2018-12-25 DIAGNOSIS — R3914 Feeling of incomplete bladder emptying: Secondary | ICD-10-CM | POA: Diagnosis not present

## 2019-01-13 DIAGNOSIS — Z20828 Contact with and (suspected) exposure to other viral communicable diseases: Secondary | ICD-10-CM | POA: Diagnosis not present

## 2019-01-13 DIAGNOSIS — Z03818 Encounter for observation for suspected exposure to other biological agents ruled out: Secondary | ICD-10-CM | POA: Diagnosis not present

## 2019-01-28 DIAGNOSIS — M25561 Pain in right knee: Secondary | ICD-10-CM | POA: Diagnosis not present

## 2019-01-28 DIAGNOSIS — M1711 Unilateral primary osteoarthritis, right knee: Secondary | ICD-10-CM | POA: Diagnosis not present

## 2019-01-30 DIAGNOSIS — Z03818 Encounter for observation for suspected exposure to other biological agents ruled out: Secondary | ICD-10-CM | POA: Diagnosis not present

## 2019-01-30 DIAGNOSIS — Z20828 Contact with and (suspected) exposure to other viral communicable diseases: Secondary | ICD-10-CM | POA: Diagnosis not present

## 2019-02-09 ENCOUNTER — Ambulatory Visit (INDEPENDENT_AMBULATORY_CARE_PROVIDER_SITE_OTHER): Payer: Medicare Other | Admitting: *Deleted

## 2019-02-09 DIAGNOSIS — I442 Atrioventricular block, complete: Secondary | ICD-10-CM | POA: Diagnosis not present

## 2019-02-09 LAB — CUP PACEART REMOTE DEVICE CHECK
Battery Remaining Longevity: 68 mo
Battery Remaining Percentage: 56 %
Battery Voltage: 2.89 V
Brady Statistic AP VP Percent: 1 %
Brady Statistic AP VS Percent: 1 %
Brady Statistic AS VP Percent: 99 %
Brady Statistic AS VS Percent: 1 %
Brady Statistic RA Percent Paced: 1 %
Brady Statistic RV Percent Paced: 99 %
Date Time Interrogation Session: 20201130020023
Implantable Lead Implant Date: 20140703
Implantable Lead Implant Date: 20140703
Implantable Lead Location: 753859
Implantable Lead Location: 753860
Implantable Pulse Generator Implant Date: 20140703
Lead Channel Impedance Value: 460 Ohm
Lead Channel Impedance Value: 480 Ohm
Lead Channel Pacing Threshold Amplitude: 0.5 V
Lead Channel Pacing Threshold Amplitude: 0.875 V
Lead Channel Pacing Threshold Pulse Width: 0.4 ms
Lead Channel Pacing Threshold Pulse Width: 0.4 ms
Lead Channel Sensing Intrinsic Amplitude: 1.2 mV
Lead Channel Sensing Intrinsic Amplitude: 12 mV
Lead Channel Setting Pacing Amplitude: 1.125
Lead Channel Setting Pacing Amplitude: 2 V
Lead Channel Setting Pacing Pulse Width: 0.4 ms
Lead Channel Setting Sensing Sensitivity: 12.5 mV
Pulse Gen Model: 2240
Pulse Gen Serial Number: 7522008

## 2019-02-25 ENCOUNTER — Ambulatory Visit (INDEPENDENT_AMBULATORY_CARE_PROVIDER_SITE_OTHER): Payer: Medicare Other | Admitting: Neurology

## 2019-02-25 ENCOUNTER — Other Ambulatory Visit: Payer: Self-pay

## 2019-02-25 ENCOUNTER — Encounter: Payer: Self-pay | Admitting: Neurology

## 2019-02-25 VITALS — BP 129/58 | HR 62 | Temp 97.1°F | Ht 64.0 in | Wt 128.5 lb

## 2019-02-25 DIAGNOSIS — M542 Cervicalgia: Secondary | ICD-10-CM

## 2019-02-25 DIAGNOSIS — G243 Spasmodic torticollis: Secondary | ICD-10-CM

## 2019-02-25 MED ORDER — ONABOTULINUMTOXINA 100 UNITS IJ SOLR
100.0000 [IU] | Freq: Once | INTRAMUSCULAR | Status: AC
  Administered 2019-02-25: 100 [IU] via INTRAMUSCULAR

## 2019-02-25 NOTE — Progress Notes (Signed)
PATIENT: Wendy Velasquez DOB: 22-Sep-1925  Chief Complaint  Patient presents with  . Cervical Dystonia    Botox 100 units x 1 vial - office supply     HISTORICAL  Wendy Velasquez is a 85 years old female, seen in request by her primary care physician Dr. Osborne Casco, [place]ans[place] Him for evaluation of neck pain, headaches, initial evaluation was on December 05, 2017.  She is accompanied by her daughter at today's clinical visit.  I have reviewed and summarized the referring note from the referring physician, she has past medical history of diabetes, use insulin, history of cardiac block, status post pacemaker, hyperlipidemia,  She had a history of chronic neck pain, had acute worsening since March 2018, she woke up one morning, noticed upper neck throbbing pain, radiating pain to her skull, has been persistent since then, over the last 1 year, she was seen by orthopedic PA, Ronette Deter, was treated with multiple trigger point injection, with mixture of 1% lidocaine, and Depo-Medrol, most recent injection was on October 10, 2017, on a monthly basis, patient denies significant improvement, also complains of worsening glucose control after injection,  She denies significant radiating pain to bilateral shoulder upper extremity, denies difficulty using arms, she had chronic urinary incontinence is receiving Botox injection by urologist, she contributed to her gait abnormality to her bilateral knee pain, chronic arthritis,  She now complains of difficulty turning her neck, 6 out of 10 constant deep achy pain, multiple tender spots at cervical, upper trapezius, oftentimes she has transient sharp radiating range of motion, difficulty turning her neck,  She also tried physical therapy, is doing morning neck traction regularly,  UPDATE Jan 02 2018: She continue complains of significant neck pain, 8 out of 10 sometimes, radiating pain to bilateral occipital region despite gabapentin 100 mg 5 tablets each  day, I have personally reviewed CT cervical spine on December 10, 2017, no acute abnormality, multilevel spondylolisthesis, maximum at C4-5, with associated   facet arthropathy, evidence of ankylosis, mild spinal stenosis at the C2-3, C3-4 level  UPDATE Apr 08 2018: She responded very well to her initial injection January 02, 2018, we used 100 units of Botox, a month after injection she was pain-free for 2 months, there was no significant side effect noticed, in the past 2 weeks, she noticed returning of her neck pain, centered at bilateral mastoid process, nuchal line, tenderness upon deep palpitation.  UPDATE August 12 2018: She responded very well to previous injection  UPDATE Sept 9 2020: She responded well to previous injection, we used Botox a 100 units, is take about 3 days for the benefit to kicking, lasting for 2 to 3 months, no significant side effect.  UPDATE Feb 25 2019: She responded well to previous injection   REVIEW OF SYSTEMS: Full 14 system review of systems performed and notable only for as above. ALLERGIES: No Known Allergies  HOME MEDICATIONS: Current Outpatient Medications  Medication Sig Dispense Refill  . acetaminophen (TYLENOL) 500 MG tablet Take 1,000 mg by mouth every 6 (six) hours as needed for fever.    Marland Kitchen albuterol (PROVENTIL) (2.5 MG/3ML) 0.083% nebulizer solution Take 2.5 mg by nebulization every 6 (six) hours as needed for wheezing or shortness of breath.    Marland Kitchen albuterol-ipratropium (COMBIVENT) 18-103 MCG/ACT inhaler Inhale 2 puffs into the lungs every 6 (six) hours as needed for wheezing.    Marland Kitchen aspirin EC 81 MG tablet Take 81 mg by mouth daily.    . botulinum  toxin Type A (BOTOX) 100 units SOLR injection Inject 100 Units into the muscle every 3 (three) months.    . Brinzolamide-Brimonidine (SIMBRINZA) 1-0.2 % SUSP Apply 1 drop to eye at bedtime.    . cetirizine (ZYRTEC) 10 MG tablet Take 10 mg by mouth daily.    . Esomeprazole Magnesium (NEXIUM PO) Take 1 tablet  by mouth daily.    . fluticasone (FLONASE) 50 MCG/ACT nasal spray Place 2 sprays into both nostrils as needed. SHAKE AS DIRECTED     . Fluticasone-Salmeterol (ADVAIR DISKUS) 250-50 MCG/DOSE AEPB Inhale 1 puff into the lungs 2 (two) times daily.    Marland Kitchen gabapentin (NEURONTIN) 100 MG capsule Take 2 capsules in am, 2 capsules midday, 1 capsule at bedtime. 450 capsule 3  . ibandronate (BONIVA) 150 MG tablet Take 150 mg by mouth every 30 (thirty) days. Take in the morning with a full glass of water, on an empty stomach, and do not take anything else by mouth or lie down for the next 30 min.    . insulin glargine (LANTUS) 100 UNIT/ML injection Inject 12 Units into the skin at bedtime. 9 pm daily    . metFORMIN (GLUCOPHAGE) 1000 MG tablet Take 1,000 mg by mouth 2 (two) times daily with a meal.    . MYRBETRIQ 25 MG TB24 tablet Take 25 mg by mouth daily.   6  . sertraline (ZOLOFT) 50 MG tablet Take 50 mg by mouth 2 (two) times daily.    Marland Kitchen trimethoprim (TRIMPEX) 100 MG tablet Take 1 tablet (100 mg total) by mouth daily. OK TO RESUME AFTER YOU FINISH CURRENT ANTIBIOTIC THERAPY.    . vitamin B-12 (CYANOCOBALAMIN) 1000 MCG tablet Take 1,000 mcg by mouth daily.     No current facility-administered medications for this visit.    PAST MEDICAL HISTORY: Past Medical History:  Diagnosis Date  . Asthma   . Cardiac pacemaker in situ   . Complete heart block ([place])   . COPD (chronic obstructive pulmonary disease) ([place])   . Diverticulosis of colon   . GERD (gastroesophageal reflux disease)   . Glaucoma of both eyes   . Heart disease   . History of cardiac arrest    09-11-2012--  symptomatic complete heart block -- hr 20's--  cpr, intubated and temp. pacer until  perm. pacemaker placement  . History of esophageal dilatation    for stricture 2007  . History of hiatal hernia   . History of iron deficiency anemia    hx transfusion's 15 yrs ago , approx 2001  . Hyperlipidemia   . Hypertension   . Neck pain   .  Peripheral neuropathy   . Tachy-brady syndrome ([place])   . Type 2 diabetes mellitus ([place])   . Urgency incontinence    Botox injections for bladder.  . Wears glasses     PAST SURGICAL HISTORY: Past Surgical History:  Procedure Laterality Date  . ABDOMINAL HYSTERECTOMY  1976  approx   w/ unilateral salpingoophorectomy  . CATARACT EXTRACTION W/ INTRAOCULAR LENS  IMPLANT, BILATERAL  1990  . COLONOSCOPY WITH ESOPHAGOGASTRODUODENOSCOPY (EGD)  last one 2007  . INTERSTIM IMPLANT PLACEMENT  2012  . INTERSTIM IMPLANT REMOVAL N/A 09/21/2014   Procedure: REMOVAL OF INTERSTIM IMPLANT;  Surgeon: Bjorn Loser, MD;  Location: [facility];  Service: Urology;  Laterality: N/A;  . INTERSTIM IMPLANT REVISION N/A 09/21/2014   Procedure: Barrie Lyme STAGE ONE AND TWO;  Surgeon: Bjorn Loser, MD;  Location: [facility];  Service: Urology;  Laterality: N/A;  . LAPAROTOMY W/ UNILATERAL SALPINGOOPHORECTOMY  1960's  approx  . PERMANENT PACEMAKER INSERTION N/A 09/11/2012   Procedure: PERMANENT PACEMAKER INSERTION;  Surgeon: Evans Lance, MD;  Location: [facility] CATH LAB;  Service: Cardiovascular;  Laterality: N/A;  St. Jude Dual Chamber  . RECTOCELE REPAIR  1991  approx    FAMILY HISTORY: Family History  Problem Relation Age of Onset  . Other Mother        died during her childbirth  . Prostatitis Father   . Heart failure Sister   . Breast cancer Sister   . Diabetic kidney disease Sister   . Melanoma Brother     SOCIAL HISTORY: Social History   Socioeconomic History  . Marital status: Married    Spouse name: Not on file  . Number of children: 3  . Years of education: 29  . Highest education level: High school graduate  Occupational History  . Occupation: Retired  Tobacco Use  . Smoking status: Never Smoker  . Smokeless tobacco: Never Used  Substance and Sexual Activity  . Alcohol use: No  . Drug use: No  . Sexual activity: Not on file  Other Topics Concern  .  Not on file  Social History Narrative   Lives with her husband in an independent living facility.   Right-handed.   1-2 cup caffeine daily.   Social Determinants of Health   Financial Resource Strain:   . Difficulty of Paying Living Expenses: Not on file  Food Insecurity:   . Worried About Charity fundraiser in the Last Year: Not on file  . Ran Out of Food in the Last Year: Not on file  Transportation Needs:   . Lack of Transportation (Medical): Not on file  . Lack of Transportation (Non-Medical): Not on file  Physical Activity:   . Days of Exercise per Week: Not on file  . Minutes of Exercise per Session: Not on file  Stress:   . Feeling of Stress : Not on file  Social Connections:   . Frequency of Communication with Friends and Family: Not on file  . Frequency of Social Gatherings with Friends and Family: Not on file  . Attends Religious Services: Not on file  . Active Member of Clubs or Organizations: Not on file  . Attends Archivist Meetings: Not on file  . Marital Status: Not on file  Intimate Partner Violence:   . Fear of Current or Ex-Partner: Not on file  . Emotionally Abused: Not on file  . Physically Abused: Not on file  . Sexually Abused: Not on file     PHYSICAL EXAM   Body mass index is 22.06 kg/m.   DIAGNOSTIC DATA (LABS, IMAGING, TESTING) - I reviewed patient records, labs, notes, testing and imaging myself where available.   ASSESSMENT AND PLAN  Wendy Velasquez is a 85 y.o. female    Chronic neck pain, headaches Abnormal neck posturing, mild retrocollis, left tilt, significant tenderness at bilateral lateral cervical paraspinal muscles, bilateral levator scapular  Consistent with upper cervical degenerative disc disease  CT cervical showed evidence of multilevel spondylolisthesis, maximum at C4-5 with associated facet arthropathy, developing ankylosis  She had tried and failed multiple treatment in the past, including neck traction,  heating pad, trigger point injection, gabapentin, responded very well to EMG guided Botox injection, initial injection was on January 02, 2018,  Repeat EMG guided Botox injection Used 100 units of Botox A    Right longissimus capitis 12.5  units Right splenius cervix 12.5 Right semispinalis 12.5 units Right levator scapular 12.5 units  Left longissimus capitis 25 units Left splenius cervix 12.5 units Left semispinalis 12.5 units Left levator scapular 12.5 units   Marcial Pacas, M.D. Ph.D.  [facility] [address] [place], [place] [address] Ph: [phone] Fax: [phone]

## 2019-02-25 NOTE — Progress Notes (Signed)
Botox 100 units x 1 vial, NDC 0023-1145-01, Lot [id number], Exp 04/2021, office supply.//mck,rn** 

## 2019-02-26 ENCOUNTER — Telehealth: Payer: Self-pay | Admitting: Neurology

## 2019-02-26 ENCOUNTER — Telehealth: Payer: Self-pay | Admitting: *Deleted

## 2019-02-26 MED ORDER — DICLOFENAC SODIUM 1 % EX GEL: 4.0000 g | Freq: Four times a day (QID) | CUTANEOUS | 11 refills | Status: DC | PRN

## 2019-02-26 MED ORDER — LIDOCAINE-PRILOCAINE 2.5-2.5 % EX CREA: 1.0000 "application " | TOPICAL_CREAM | Freq: Four times a day (QID) | CUTANEOUS | 11 refills | Status: DC | PRN

## 2019-02-26 NOTE — Telephone Encounter (Signed)
I returned the call to the patient's dgt on DPR.  She is aware that the prescriptions have been sent to the pharmacy.

## 2019-02-26 NOTE — Telephone Encounter (Signed)
Pts daughter [name] ( on [place]) called in and stated a cream for muscle neuropathy was suppose to be sent to pharmacy yesterday for patient but nothing was sent . Please advise

## 2019-02-26 NOTE — Telephone Encounter (Signed)
I have ERx diclofenac gel and emla gel

## 2019-02-26 NOTE — Telephone Encounter (Signed)
PA for diclofenac gel approved by [facility].  Case [id number]. Pt [id number].  Covermymeds key#[id number].  Approval valid through 02/26/2020.

## 2019-03-02 ENCOUNTER — Telehealth: Payer: Self-pay | Admitting: Neurology

## 2019-03-02 NOTE — Telephone Encounter (Signed)
Dr. Rexene Alberts signed the PT evaluation request.  The orders have been faxed back to [facility] at [phone].  Confirmation of receipt received.

## 2019-03-02 NOTE — Telephone Encounter (Signed)
Robin [email or web address]  [facility] from [employer] [phone] has called asking for orders for an evaluation for physical therapy.  Please call

## 2019-03-02 NOTE — Telephone Encounter (Signed)
I returned the call to [place].  She is aware that Dr. Krista Blue is out of the office this week but the PT request will be addressed by Dr. Rexene Alberts who is the work-in MD.

## 2019-03-04 NOTE — Progress Notes (Signed)
PPM remote 

## 2019-03-16 DIAGNOSIS — M542 Cervicalgia: Secondary | ICD-10-CM | POA: Diagnosis not present

## 2019-03-16 DIAGNOSIS — M6281 Muscle weakness (generalized): Secondary | ICD-10-CM | POA: Diagnosis not present

## 2019-03-16 DIAGNOSIS — R531 Weakness: Secondary | ICD-10-CM | POA: Diagnosis not present

## 2019-03-19 DIAGNOSIS — M542 Cervicalgia: Secondary | ICD-10-CM | POA: Diagnosis not present

## 2019-03-19 DIAGNOSIS — R531 Weakness: Secondary | ICD-10-CM | POA: Diagnosis not present

## 2019-03-19 DIAGNOSIS — M6281 Muscle weakness (generalized): Secondary | ICD-10-CM | POA: Diagnosis not present

## 2019-03-23 DIAGNOSIS — J4550 Severe persistent asthma, uncomplicated: Secondary | ICD-10-CM | POA: Diagnosis not present

## 2019-03-23 DIAGNOSIS — E1129 Type 2 diabetes mellitus with other diabetic kidney complication: Secondary | ICD-10-CM | POA: Diagnosis not present

## 2019-03-23 DIAGNOSIS — E7800 Pure hypercholesterolemia, unspecified: Secondary | ICD-10-CM | POA: Diagnosis not present

## 2019-03-23 DIAGNOSIS — I129 Hypertensive chronic kidney disease with stage 1 through stage 4 chronic kidney disease, or unspecified chronic kidney disease: Secondary | ICD-10-CM | POA: Diagnosis not present

## 2019-03-23 DIAGNOSIS — Z950 Presence of cardiac pacemaker: Secondary | ICD-10-CM | POA: Diagnosis not present

## 2019-03-23 DIAGNOSIS — N1831 Chronic kidney disease, stage 3a: Secondary | ICD-10-CM | POA: Diagnosis not present

## 2019-03-23 DIAGNOSIS — F3341 Major depressive disorder, recurrent, in partial remission: Secondary | ICD-10-CM | POA: Diagnosis not present

## 2019-03-23 DIAGNOSIS — M179 Osteoarthritis of knee, unspecified: Secondary | ICD-10-CM | POA: Diagnosis not present

## 2019-03-24 DIAGNOSIS — E1129 Type 2 diabetes mellitus with other diabetic kidney complication: Secondary | ICD-10-CM | POA: Diagnosis not present

## 2019-03-24 DIAGNOSIS — M6281 Muscle weakness (generalized): Secondary | ICD-10-CM | POA: Diagnosis not present

## 2019-03-24 DIAGNOSIS — R531 Weakness: Secondary | ICD-10-CM | POA: Diagnosis not present

## 2019-03-24 DIAGNOSIS — M542 Cervicalgia: Secondary | ICD-10-CM | POA: Diagnosis not present

## 2019-03-24 DIAGNOSIS — E7800 Pure hypercholesterolemia, unspecified: Secondary | ICD-10-CM | POA: Diagnosis not present

## 2019-03-26 DIAGNOSIS — R531 Weakness: Secondary | ICD-10-CM | POA: Diagnosis not present

## 2019-03-26 DIAGNOSIS — M6281 Muscle weakness (generalized): Secondary | ICD-10-CM | POA: Diagnosis not present

## 2019-03-26 DIAGNOSIS — M542 Cervicalgia: Secondary | ICD-10-CM | POA: Diagnosis not present

## 2019-03-31 DIAGNOSIS — M542 Cervicalgia: Secondary | ICD-10-CM | POA: Diagnosis not present

## 2019-03-31 DIAGNOSIS — R531 Weakness: Secondary | ICD-10-CM | POA: Diagnosis not present

## 2019-03-31 DIAGNOSIS — M6281 Muscle weakness (generalized): Secondary | ICD-10-CM | POA: Diagnosis not present

## 2019-04-02 DIAGNOSIS — R531 Weakness: Secondary | ICD-10-CM | POA: Diagnosis not present

## 2019-04-02 DIAGNOSIS — M6281 Muscle weakness (generalized): Secondary | ICD-10-CM | POA: Diagnosis not present

## 2019-04-02 DIAGNOSIS — M542 Cervicalgia: Secondary | ICD-10-CM | POA: Diagnosis not present

## 2019-04-04 DIAGNOSIS — Z20828 Contact with and (suspected) exposure to other viral communicable diseases: Secondary | ICD-10-CM | POA: Diagnosis not present

## 2019-04-04 DIAGNOSIS — Z03818 Encounter for observation for suspected exposure to other biological agents ruled out: Secondary | ICD-10-CM | POA: Diagnosis not present

## 2019-04-07 ENCOUNTER — Other Ambulatory Visit: Payer: Self-pay | Admitting: Physician Assistant

## 2019-04-07 DIAGNOSIS — J441 Chronic obstructive pulmonary disease with (acute) exacerbation: Secondary | ICD-10-CM

## 2019-04-07 DIAGNOSIS — R54 Age-related physical debility: Secondary | ICD-10-CM

## 2019-04-07 DIAGNOSIS — Z794 Long term (current) use of insulin: Secondary | ICD-10-CM

## 2019-04-07 DIAGNOSIS — U071 COVID-19: Secondary | ICD-10-CM

## 2019-04-07 DIAGNOSIS — E1165 Type 2 diabetes mellitus with hyperglycemia: Secondary | ICD-10-CM

## 2019-04-07 DIAGNOSIS — J449 Chronic obstructive pulmonary disease, unspecified: Secondary | ICD-10-CM

## 2019-04-07 NOTE — Progress Notes (Signed)
  I connected by phone with Wendy Velasquez on 04/07/2019 at 2:47 PM to discuss the potential use of an new treatment for mild to moderate COVID-19 viral infection in non-hospitalized patients.  This patient is a 86 y.o. female that meets the FDA criteria for Emergency Use Authorization of bamlanivimab or casirivimab\imdevimab.  Has a (+) direct SARS-CoV-2 viral test result  Has mild or moderate COVID-19   Is ? 86 years of age and weighs ? 40 kg  Is NOT hospitalized due to COVID-19  Is NOT requiring oxygen therapy or requiring an increase in baseline oxygen flow rate due to COVID-19  Is within 10 days of symptom onset  Has at least one of the high risk factor(s) for progression to severe COVID-19 and/or hospitalization as defined in EUA.  Specific high risk criteria : >/= 84 yo   I have spoken and communicated the following to the patient or parent/caregiver:  1. FDA has authorized the emergency use of bamlanivimab and casirivimab\imdevimab for the treatment of mild to moderate COVID-19 in adults and pediatric patients with positive results of direct SARS-CoV-2 viral testing who are 24 years of age and older weighing at least 40 kg, and who are at high risk for progressing to severe COVID-19 and/or hospitalization.  2. The significant known and potential risks and benefits of bamlanivimab and casirivimab\imdevimab, and the extent to which such potential risks and benefits are unknown.  3. Information on available alternative treatments and the risks and benefits of those alternatives, including clinical trials.  4. Patients treated with bamlanivimab and casirivimab\imdevimab should continue to self-isolate and use infection control measures (e.g., wear mask, isolate, social distance, avoid sharing personal items, clean and disinfect "high touch" surfaces, and frequent handwashing) according to CDC guidelines.   5. The patient or parent/caregiver has the option to accept or refuse  bamlanivimab or casirivimab\imdevimab .  After reviewing this information with the patient, The patient agreed to proceed with receiving the bamlanimivab infusion and will be provided a copy of the Fact sheet prior to receiving the infusion.   Spoke with daughter, Wendy Velasquez. Set up for Friday 04/10/19 @2 :30pm. Told to bring copy of positive result or fax to infusion center.   Angelena Form 04/07/2019 2:47 PM  CC: Dr. Osborne Casco

## 2019-04-09 ENCOUNTER — Encounter (HOSPITAL_COMMUNITY): Payer: Self-pay

## 2019-04-10 ENCOUNTER — Ambulatory Visit (HOSPITAL_COMMUNITY)
Admission: RE | Admit: 2019-04-10 | Discharge: 2019-04-10 | Disposition: A | Discharge status: Home / Self Care | Payer: Medicare Other | Source: Ambulatory Visit | Attending: Pulmonary Disease | Admitting: Pulmonary Disease

## 2019-04-10 DIAGNOSIS — U071 COVID-19: Secondary | ICD-10-CM | POA: Diagnosis not present

## 2019-04-10 DIAGNOSIS — R54 Age-related physical debility: Secondary | ICD-10-CM | POA: Diagnosis not present

## 2019-04-10 DIAGNOSIS — J449 Chronic obstructive pulmonary disease, unspecified: Secondary | ICD-10-CM | POA: Insufficient documentation

## 2019-04-10 DIAGNOSIS — E1165 Type 2 diabetes mellitus with hyperglycemia: Secondary | ICD-10-CM | POA: Diagnosis not present

## 2019-04-10 DIAGNOSIS — Z794 Long term (current) use of insulin: Secondary | ICD-10-CM | POA: Diagnosis not present

## 2019-04-10 DIAGNOSIS — Z23 Encounter for immunization: Secondary | ICD-10-CM | POA: Insufficient documentation

## 2019-04-10 MED ORDER — DIPHENHYDRAMINE HCL 50 MG/ML IJ SOLN: 50.0000 mg | Freq: Once | INTRAMUSCULAR | Status: DC | PRN

## 2019-04-10 MED ORDER — SODIUM CHLORIDE 0.9 % IV SOLN
700.0000 mg | Freq: Once | INTRAVENOUS | Status: AC
  Administered 2019-04-10: 15:00:00 700 mg via INTRAVENOUS
  Filled 2019-04-10: qty 20

## 2019-04-10 MED ORDER — EPINEPHRINE 0.3 MG/0.3ML IJ SOAJ: 0.3000 mg | Freq: Once | INTRAMUSCULAR | Status: DC | PRN

## 2019-04-10 MED ORDER — FAMOTIDINE IN NACL 20-0.9 MG/50ML-% IV SOLN: 20.0000 mg | Freq: Once | INTRAVENOUS | Status: DC | PRN

## 2019-04-10 MED ORDER — METHYLPREDNISOLONE SODIUM SUCC 125 MG IJ SOLR: 125.0000 mg | Freq: Once | INTRAMUSCULAR | Status: DC | PRN

## 2019-04-10 MED ORDER — SODIUM CHLORIDE 0.9 % IV SOLN
INTRAVENOUS | Status: DC | PRN
  Administered 2019-04-10: 250 mL via INTRAVENOUS

## 2019-04-10 MED ORDER — ALBUTEROL SULFATE HFA 108 (90 BASE) MCG/ACT IN AERS: 2.0000 | INHALATION_SPRAY | Freq: Once | RESPIRATORY_TRACT | Status: DC | PRN

## 2019-04-10 NOTE — Discharge Instructions (Signed)

## 2019-04-10 NOTE — Progress Notes (Signed)
  Diagnosis: COVID-19  Physician:  Procedure: [facility] Med: bamlanivimab infusion - Provided patient with bamlanimivab fact sheet for patients, parents and caregivers prior to infusion.  Complications: No immediate complications noted.  Discharge: Discharged home   Scotty Court 04/10/2019

## 2019-04-17 ENCOUNTER — Emergency Department (HOSPITAL_COMMUNITY): Payer: Medicare Other

## 2019-04-17 ENCOUNTER — Other Ambulatory Visit: Payer: Self-pay

## 2019-04-17 ENCOUNTER — Inpatient Hospital Stay (HOSPITAL_COMMUNITY)
Admission: EM | Admit: 2019-04-17 | Discharge: 2019-04-23 | DRG: 177 | Disposition: A | Discharge status: Home Health | Payer: Medicare Other | Attending: Internal Medicine | Admitting: Internal Medicine

## 2019-04-17 DIAGNOSIS — J9601 Acute respiratory failure with hypoxia: Secondary | ICD-10-CM

## 2019-04-17 DIAGNOSIS — R0689 Other abnormalities of breathing: Secondary | ICD-10-CM | POA: Diagnosis not present

## 2019-04-17 DIAGNOSIS — E785 Hyperlipidemia, unspecified: Secondary | ICD-10-CM | POA: Diagnosis present

## 2019-04-17 DIAGNOSIS — R531 Weakness: Secondary | ICD-10-CM | POA: Diagnosis not present

## 2019-04-17 DIAGNOSIS — Z950 Presence of cardiac pacemaker: Secondary | ICD-10-CM | POA: Diagnosis not present

## 2019-04-17 DIAGNOSIS — J1282 Pneumonia due to coronavirus disease 2019: Secondary | ICD-10-CM | POA: Diagnosis present

## 2019-04-17 DIAGNOSIS — N3941 Urge incontinence: Secondary | ICD-10-CM | POA: Diagnosis present

## 2019-04-17 DIAGNOSIS — K219 Gastro-esophageal reflux disease without esophagitis: Secondary | ICD-10-CM | POA: Diagnosis present

## 2019-04-17 DIAGNOSIS — R918 Other nonspecific abnormal finding of lung field: Secondary | ICD-10-CM | POA: Diagnosis not present

## 2019-04-17 DIAGNOSIS — I129 Hypertensive chronic kidney disease with stage 1 through stage 4 chronic kidney disease, or unspecified chronic kidney disease: Secondary | ICD-10-CM | POA: Diagnosis not present

## 2019-04-17 DIAGNOSIS — Z7401 Bed confinement status: Secondary | ICD-10-CM | POA: Diagnosis not present

## 2019-04-17 DIAGNOSIS — Z90710 Acquired absence of both cervix and uterus: Secondary | ICD-10-CM | POA: Diagnosis not present

## 2019-04-17 DIAGNOSIS — I495 Sick sinus syndrome: Secondary | ICD-10-CM | POA: Diagnosis present

## 2019-04-17 DIAGNOSIS — R7982 Elevated C-reactive protein (CRP): Secondary | ICD-10-CM

## 2019-04-17 DIAGNOSIS — K449 Diaphragmatic hernia without obstruction or gangrene: Secondary | ICD-10-CM | POA: Diagnosis present

## 2019-04-17 DIAGNOSIS — F419 Anxiety disorder, unspecified: Secondary | ICD-10-CM | POA: Diagnosis present

## 2019-04-17 DIAGNOSIS — F329 Major depressive disorder, single episode, unspecified: Secondary | ICD-10-CM | POA: Diagnosis present

## 2019-04-17 DIAGNOSIS — Z90722 Acquired absence of ovaries, bilateral: Secondary | ICD-10-CM | POA: Diagnosis not present

## 2019-04-17 DIAGNOSIS — Z9079 Acquired absence of other genital organ(s): Secondary | ICD-10-CM

## 2019-04-17 DIAGNOSIS — I10 Essential (primary) hypertension: Secondary | ICD-10-CM | POA: Diagnosis present

## 2019-04-17 DIAGNOSIS — N1831 Chronic kidney disease, stage 3a: Secondary | ICD-10-CM | POA: Diagnosis not present

## 2019-04-17 DIAGNOSIS — J440 Chronic obstructive pulmonary disease with acute lower respiratory infection: Secondary | ICD-10-CM | POA: Diagnosis present

## 2019-04-17 DIAGNOSIS — Z7951 Long term (current) use of inhaled steroids: Secondary | ICD-10-CM

## 2019-04-17 DIAGNOSIS — M542 Cervicalgia: Secondary | ICD-10-CM | POA: Diagnosis present

## 2019-04-17 DIAGNOSIS — R5383 Other fatigue: Secondary | ICD-10-CM

## 2019-04-17 DIAGNOSIS — Z8249 Family history of ischemic heart disease and other diseases of the circulatory system: Secondary | ICD-10-CM | POA: Diagnosis not present

## 2019-04-17 DIAGNOSIS — M79609 Pain in unspecified limb: Secondary | ICD-10-CM | POA: Diagnosis not present

## 2019-04-17 DIAGNOSIS — E1140 Type 2 diabetes mellitus with diabetic neuropathy, unspecified: Secondary | ICD-10-CM | POA: Diagnosis present

## 2019-04-17 DIAGNOSIS — Z7983 Long term (current) use of bisphosphonates: Secondary | ICD-10-CM

## 2019-04-17 DIAGNOSIS — Z7982 Long term (current) use of aspirin: Secondary | ICD-10-CM | POA: Diagnosis not present

## 2019-04-17 DIAGNOSIS — Z794 Long term (current) use of insulin: Secondary | ICD-10-CM

## 2019-04-17 DIAGNOSIS — H409 Unspecified glaucoma: Secondary | ICD-10-CM | POA: Diagnosis present

## 2019-04-17 DIAGNOSIS — R197 Diarrhea, unspecified: Secondary | ICD-10-CM

## 2019-04-17 DIAGNOSIS — R0902 Hypoxemia: Secondary | ICD-10-CM | POA: Diagnosis present

## 2019-04-17 DIAGNOSIS — I959 Hypotension, unspecified: Secondary | ICD-10-CM | POA: Diagnosis not present

## 2019-04-17 DIAGNOSIS — R0602 Shortness of breath: Secondary | ICD-10-CM | POA: Diagnosis not present

## 2019-04-17 DIAGNOSIS — Z8674 Personal history of sudden cardiac arrest: Secondary | ICD-10-CM

## 2019-04-17 DIAGNOSIS — M2550 Pain in unspecified joint: Secondary | ICD-10-CM | POA: Diagnosis not present

## 2019-04-17 DIAGNOSIS — U071 COVID-19: Secondary | ICD-10-CM | POA: Diagnosis present

## 2019-04-17 DIAGNOSIS — Z841 Family history of disorders of kidney and ureter: Secondary | ICD-10-CM

## 2019-04-17 DIAGNOSIS — R05 Cough: Secondary | ICD-10-CM | POA: Diagnosis not present

## 2019-04-17 DIAGNOSIS — J189 Pneumonia, unspecified organism: Secondary | ICD-10-CM | POA: Diagnosis not present

## 2019-04-17 DIAGNOSIS — Z79899 Other long term (current) drug therapy: Secondary | ICD-10-CM

## 2019-04-17 LAB — GLUCOSE, CAPILLARY: Glucose-Capillary: 144 mg/dL — ABNORMAL HIGH (ref 70–99)

## 2019-04-17 LAB — URINALYSIS, ROUTINE W REFLEX MICROSCOPIC
Bilirubin Urine: NEGATIVE
Glucose, UA: NEGATIVE mg/dL
Hgb urine dipstick: NEGATIVE
Ketones, ur: NEGATIVE mg/dL
Leukocytes,Ua: NEGATIVE
Nitrite: NEGATIVE
Protein, ur: NEGATIVE mg/dL
Specific Gravity, Urine: 1.02 (ref 1.005–1.030)
pH: 5 (ref 5.0–8.0)

## 2019-04-17 LAB — CBG MONITORING, ED: Glucose-Capillary: 127 mg/dL — ABNORMAL HIGH (ref 70–99)

## 2019-04-17 LAB — COMPREHENSIVE METABOLIC PANEL
ALT: 13 U/L (ref 0–44)
AST: 13 U/L — ABNORMAL LOW (ref 15–41)
Albumin: 3.1 g/dL — ABNORMAL LOW (ref 3.5–5.0)
Alkaline Phosphatase: 66 U/L (ref 38–126)
Anion gap: 10 (ref 5–15)
BUN: 31 mg/dL — ABNORMAL HIGH (ref 8–23)
CO2: 25 mmol/L (ref 22–32)
Calcium: 9.1 mg/dL (ref 8.9–10.3)
Chloride: 102 mmol/L (ref 98–111)
Creatinine, Ser: 0.9 mg/dL (ref 0.44–1.00)
GFR calc Af Amer: >60 mL/min (ref >60)
GFR calc non Af Amer: 56 mL/min — ABNORMAL LOW (ref >60)
Glucose, Bld: 126 mg/dL — ABNORMAL HIGH (ref 70–99)
Potassium: 4.5 mmol/L (ref 3.5–5.1)
Sodium: 137 mmol/L (ref 135–145)
Total Bilirubin: 0.6 mg/dL (ref 0.3–1.2)
Total Protein: 6.7 g/dL (ref 6.5–8.1)

## 2019-04-17 LAB — LACTIC ACID, PLASMA
Lactic Acid, Venous: 1.2 mmol/L (ref 0.5–1.9)
Lactic Acid, Venous: 0.9 mmol/L (ref 0.5–1.9)

## 2019-04-17 LAB — CBC WITH DIFFERENTIAL/PLATELET
Abs Immature Granulocytes: 0.38 10*3/uL — ABNORMAL HIGH (ref 0.00–0.07)
Basophils Absolute: 0.1 10*3/uL (ref 0.0–0.1)
Basophils Relative: 1 %
Eosinophils Absolute: 0.2 10*3/uL (ref 0.0–0.5)
Eosinophils Relative: 2 %
HCT: 36.6 % (ref 36.0–46.0)
Hemoglobin: 12 g/dL (ref 12.0–15.0)
Immature Granulocytes: 4 %
Lymphocytes Relative: 11 %
Lymphs Abs: 1.1 10*3/uL (ref 0.7–4.0)
MCH: 28.7 pg (ref 26.0–34.0)
MCHC: 32.8 g/dL (ref 30.0–36.0)
MCV: 87.6 fL (ref 80.0–100.0)
Monocytes Absolute: 0.9 10*3/uL (ref 0.1–1.0)
Monocytes Relative: 9 %
Neutro Abs: 7.3 10*3/uL (ref 1.7–7.7)
Neutrophils Relative %: 73 %
Platelets: 373 10*3/uL (ref 150–400)
RBC: 4.18 MIL/uL (ref 3.87–5.11)
RDW: 14.6 % (ref 11.5–15.5)
WBC: 9.9 10*3/uL (ref 4.0–10.5)
nRBC: 0 % (ref 0.0–0.2)

## 2019-04-17 LAB — TSH: TSH: 1.767 u[IU]/mL (ref 0.350–4.500)

## 2019-04-17 LAB — LIPASE, BLOOD: Lipase: 18 U/L (ref 11–51)

## 2019-04-17 MED ORDER — GABAPENTIN 100 MG PO CAPS
200.0000 mg | ORAL_CAPSULE | Freq: Two times a day (BID) | ORAL | Status: DC
  Administered 2019-04-18 – 2019-04-23 (×12): 200 mg via ORAL
  Filled 2019-04-17 (×12): qty 2

## 2019-04-17 MED ORDER — SODIUM CHLORIDE 0.9 % IV SOLN
500.0000 mg | Freq: Once | INTRAVENOUS | Status: DC
  Filled 2019-04-17: qty 500

## 2019-04-17 MED ORDER — MOMETASONE FURO-FORMOTEROL FUM 200-5 MCG/ACT IN AERO
2.0000 | INHALATION_SPRAY | Freq: Two times a day (BID) | RESPIRATORY_TRACT | Status: DC
  Administered 2019-04-17 – 2019-04-23 (×12): 2 via RESPIRATORY_TRACT
  Filled 2019-04-17: qty 8.8

## 2019-04-17 MED ORDER — IPRATROPIUM-ALBUTEROL 18-103 MCG/ACT IN AERO: 2.0000 | INHALATION_SPRAY | Freq: Three times a day (TID) | RESPIRATORY_TRACT | Status: DC

## 2019-04-17 MED ORDER — SODIUM CHLORIDE 0.9 % IV SOLN: 1.0000 g | INTRAVENOUS | Status: DC

## 2019-04-17 MED ORDER — SODIUM CHLORIDE 0.9 % IV SOLN
100.0000 mg | Freq: Two times a day (BID) | INTRAVENOUS | Status: DC
  Administered 2019-04-17: 20:00:00 100 mg via INTRAVENOUS
  Filled 2019-04-17 (×2): qty 100

## 2019-04-17 MED ORDER — INSULIN ASPART 100 UNIT/ML ~~LOC~~ SOLN
0.0000 [IU] | Freq: Every day | SUBCUTANEOUS | Status: DC
  Administered 2019-04-20: 21:00:00 2 [IU] via SUBCUTANEOUS
  Administered 2019-04-21: 20:00:00 3 [IU] via SUBCUTANEOUS
  Filled 2019-04-17: qty 0.05

## 2019-04-17 MED ORDER — DEXTROSE 5 % IV SOLN: 250.0000 mg | INTRAVENOUS | Status: DC

## 2019-04-17 MED ORDER — ENOXAPARIN SODIUM 40 MG/0.4ML ~~LOC~~ SOLN
40.0000 mg | SUBCUTANEOUS | Status: DC
  Administered 2019-04-17 – 2019-04-22 (×6): 40 mg via SUBCUTANEOUS
  Filled 2019-04-17 (×6): qty 0.4

## 2019-04-17 MED ORDER — IPRATROPIUM-ALBUTEROL 20-100 MCG/ACT IN AERS
1.0000 | INHALATION_SPRAY | Freq: Three times a day (TID) | RESPIRATORY_TRACT | Status: DC
  Administered 2019-04-17 – 2019-04-23 (×17): 1 via RESPIRATORY_TRACT
  Filled 2019-04-17: qty 4

## 2019-04-17 MED ORDER — VITAMIN D 25 MCG (1000 UNIT) PO TABS
1000.0000 [IU] | ORAL_TABLET | Freq: Every day | ORAL | Status: DC
  Administered 2019-04-18 – 2019-04-23 (×7): 1000 [IU] via ORAL
  Filled 2019-04-17 (×7): qty 1

## 2019-04-17 MED ORDER — ASCORBIC ACID 500 MG PO TABS
500.0000 mg | ORAL_TABLET | Freq: Every day | ORAL | Status: DC
  Administered 2019-04-17 – 2019-04-23 (×7): 500 mg via ORAL
  Filled 2019-04-17 (×7): qty 1

## 2019-04-17 MED ORDER — IPRATROPIUM-ALBUTEROL 18-103 MCG/ACT IN AERO: 2.0000 | INHALATION_SPRAY | Freq: Three times a day (TID) | RESPIRATORY_TRACT | Status: DC | PRN

## 2019-04-17 MED ORDER — SODIUM CHLORIDE 0.9 % IV SOLN
1.0000 g | Freq: Once | INTRAVENOUS | Status: AC
  Administered 2019-04-17: 18:00:00 1 g via INTRAVENOUS
  Filled 2019-04-17: qty 10

## 2019-04-17 MED ORDER — MIRABEGRON ER 25 MG PO TB24
25.0000 mg | ORAL_TABLET | Freq: Every day | ORAL | Status: DC
  Administered 2019-04-18 – 2019-04-23 (×6): 25 mg via ORAL
  Filled 2019-04-17 (×6): qty 1

## 2019-04-17 MED ORDER — ZINC SULFATE 220 (50 ZN) MG PO CAPS
220.0000 mg | ORAL_CAPSULE | Freq: Every day | ORAL | Status: DC
  Administered 2019-04-17 – 2019-04-23 (×7): 220 mg via ORAL
  Filled 2019-04-17 (×7): qty 1

## 2019-04-17 MED ORDER — PANTOPRAZOLE SODIUM 40 MG PO TBEC
40.0000 mg | DELAYED_RELEASE_TABLET | Freq: Every day | ORAL | Status: DC
  Administered 2019-04-17 – 2019-04-23 (×7): 40 mg via ORAL
  Filled 2019-04-17 (×7): qty 1

## 2019-04-17 MED ORDER — SODIUM CHLORIDE 0.9 % IV BOLUS
500.0000 mL | Freq: Once | INTRAVENOUS | Status: AC
  Administered 2019-04-17: 13:00:00 500 mL via INTRAVENOUS

## 2019-04-17 MED ORDER — VITAMIN B-12 1000 MCG PO TABS
1000.0000 ug | ORAL_TABLET | Freq: Every day | ORAL | Status: DC
  Administered 2019-04-18 – 2019-04-23 (×6): 1000 ug via ORAL
  Filled 2019-04-17 (×6): qty 1

## 2019-04-17 MED ORDER — ACETAMINOPHEN 325 MG PO TABS
650.0000 mg | ORAL_TABLET | Freq: Four times a day (QID) | ORAL | Status: DC | PRN
  Administered 2019-04-18 – 2019-04-20 (×3): 650 mg via ORAL
  Filled 2019-04-17 (×3): qty 2

## 2019-04-17 MED ORDER — ASPIRIN EC 81 MG PO TBEC
81.0000 mg | DELAYED_RELEASE_TABLET | Freq: Every day | ORAL | Status: DC
  Administered 2019-04-18 – 2019-04-23 (×6): 81 mg via ORAL
  Filled 2019-04-17 (×6): qty 1

## 2019-04-17 MED ORDER — SERTRALINE HCL 50 MG PO TABS
50.0000 mg | ORAL_TABLET | Freq: Two times a day (BID) | ORAL | Status: DC
  Administered 2019-04-17 – 2019-04-23 (×12): 50 mg via ORAL
  Filled 2019-04-17 (×12): qty 1

## 2019-04-17 MED ORDER — INSULIN ASPART 100 UNIT/ML ~~LOC~~ SOLN
0.0000 [IU] | Freq: Three times a day (TID) | SUBCUTANEOUS | Status: DC
  Administered 2019-04-18: 13:00:00 2 [IU] via SUBCUTANEOUS
  Administered 2019-04-18: 18:00:00 3 [IU] via SUBCUTANEOUS
  Administered 2019-04-18: 09:00:00 1 [IU] via SUBCUTANEOUS
  Administered 2019-04-19: 13:00:00 9 [IU] via SUBCUTANEOUS
  Administered 2019-04-19 (×2): 2 [IU] via SUBCUTANEOUS
  Administered 2019-04-20: 3 [IU] via SUBCUTANEOUS
  Administered 2019-04-20: 13:00:00 9 [IU] via SUBCUTANEOUS
  Administered 2019-04-20: 3 [IU] via SUBCUTANEOUS
  Administered 2019-04-21: 09:00:00 5 [IU] via SUBCUTANEOUS
  Administered 2019-04-21: 12:00:00 3 [IU] via SUBCUTANEOUS
  Administered 2019-04-21: 18:00:00 5 [IU] via SUBCUTANEOUS
  Administered 2019-04-22 (×2): 3 [IU] via SUBCUTANEOUS
  Administered 2019-04-22: 17:00:00 2 [IU] via SUBCUTANEOUS
  Administered 2019-04-23 (×2): 3 [IU] via SUBCUTANEOUS
  Filled 2019-04-17: qty 0.09

## 2019-04-17 MED ORDER — GABAPENTIN 100 MG PO CAPS
100.0000 mg | ORAL_CAPSULE | Freq: Every day | ORAL | Status: DC
  Administered 2019-04-17 – 2019-04-22 (×6): 100 mg via ORAL
  Filled 2019-04-17 (×6): qty 1

## 2019-04-17 NOTE — ED Notes (Signed)
Daughter called, Clelia Schaumann and would like updates on her mother, [phone].

## 2019-04-17 NOTE — ED Notes (Signed)
Pt was able to ambulate with assistance around the the room with no drop in the O2 sat (stayed at 100%)

## 2019-04-17 NOTE — ED Provider Notes (Signed)
[facility] DEPT Provider Note   CSN: [id number] Arrival date & time: 04/17/19  1212     History No chief complaint on file.   Wendy Velasquez is a 86 y.o. female.  The history is provided by the patient and medical records. No language interpreter was used.  Illness Location:  Generalized fatigue and weakness Severity:  Severe Onset quality:  Gradual Timing:  Constant Progression:  Worsening Chronicity:  New Associated symptoms: congestion, cough, diarrhea (resolved) and fatigue   Associated symptoms: no abdominal pain, no chest pain, no fever, no headaches, no loss of consciousness, no nausea, no rash, no shortness of breath, no vomiting and no wheezing   Fatigue:    Severity:  Severe      Past Medical History:  Diagnosis Date  . Asthma   . Cardiac pacemaker in situ   . Complete heart block ([place])   . COPD (chronic obstructive pulmonary disease) ([place])   . Diverticulosis of colon   . GERD (gastroesophageal reflux disease)   . Glaucoma of both eyes   . Heart disease   . History of cardiac arrest    09-11-2012--  symptomatic complete heart block -- hr 20's--  cpr, intubated and temp. pacer until  perm. pacemaker placement  . History of esophageal dilatation    for stricture 2007  . History of hiatal hernia   . History of iron deficiency anemia    hx transfusion's 15 yrs ago , approx 2001  . Hyperlipidemia   . Hypertension   . Neck pain   . Peripheral neuropathy   . Tachy-brady syndrome ([place])   . Type 2 diabetes mellitus ([place])   . Urgency incontinence    Botox injections for bladder.  . Wears glasses     Patient Active Problem List   Diagnosis Date Noted  . Neck pain 04/08/2018  . Cervical dystonia 01/02/2018  . Chronic neck pain 12/05/2017  . Sepsis ([place])   . Type 2 diabetes mellitus with hyperglycemia, with long-term current use of insulin ([place])   . Esophageal reflux   . COPD exacerbation ([place])   . Depression   .  Physical deconditioning   . Severe protein-calorie malnutrition ([place])   . DM2 (diabetes mellitus, type 2) ([place]) 02/25/2015  . CAP (community acquired pneumonia) 02/24/2015  . Anorexia 10/01/2014  . S/P cardiac pacemaker procedure 09/13/2012  . Complete heart block ([place]) 09/11/2012  . Cardiac arrest - due to complete Heart Block, resolved 09/11/2012  . Essential hypertension 09/11/2012  . Hyperlipidemia - on staint 09/11/2012  . COPD (chronic obstructive pulmonary disease) ([place]) 09/11/2012  . Osteoporosis 09/11/2012  . GERD (gastroesophageal reflux disease) 09/11/2012  . Acute respiratory failure - resolved, extubated 11PM 7/3 09/11/2012  . Cardiogenic shock - resolved 09/11/2012    Past Surgical History:  Procedure Laterality Date  . ABDOMINAL HYSTERECTOMY  1976  approx   w/ unilateral salpingoophorectomy  . CATARACT EXTRACTION W/ INTRAOCULAR LENS  IMPLANT, BILATERAL  1990  . COLONOSCOPY WITH ESOPHAGOGASTRODUODENOSCOPY (EGD)  last one 2007  . INTERSTIM IMPLANT PLACEMENT  2012  . INTERSTIM IMPLANT REMOVAL N/A 09/21/2014   Procedure: REMOVAL OF INTERSTIM IMPLANT;  Surgeon: Bjorn Loser, MD;  Location: [facility];  Service: Urology;  Laterality: N/A;  . INTERSTIM IMPLANT REVISION N/A 09/21/2014   Procedure: Barrie Lyme STAGE ONE AND TWO;  Surgeon: Bjorn Loser, MD;  Location: [facility];  Service: Urology;  Laterality: N/A;  . LAPAROTOMY W/ UNILATERAL SALPINGOOPHORECTOMY  1960's  approx  . PERMANENT PACEMAKER INSERTION N/A 09/11/2012   Procedure: PERMANENT PACEMAKER INSERTION;  Surgeon: Evans Lance, MD;  Location: [facility] CATH LAB;  Service: Cardiovascular;  Laterality: N/A;  St. Jude Dual Chamber  . [place]  approx     OB History   No obstetric history on file.     Family History  Problem Relation Age of Onset  . Other Mother        died during her childbirth  . Prostatitis Father   . Heart failure Sister   . Breast cancer  Sister   . Diabetic kidney disease Sister   . Melanoma Brother     Social History   Tobacco Use  . Smoking status: Never Smoker  . Smokeless tobacco: Never Used  Substance Use Topics  . Alcohol use: No  . Drug use: No    Home Medications Prior to Admission medications   Medication Sig Start Date End Date Taking? Authorizing Provider  acetaminophen (TYLENOL) 500 MG tablet Take 1,000 mg by mouth every 6 (six) hours as needed for fever.    [provider]  albuterol (PROVENTIL) (2.5 MG/3ML) 0.083% nebulizer solution Take 2.5 mg by nebulization every 6 (six) hours as needed for wheezing or shortness of breath.    [provider]  albuterol-ipratropium (COMBIVENT) 18-103 MCG/ACT inhaler Inhale 2 puffs into the lungs every 6 (six) hours as needed for wheezing.    [provider]  aspirin EC 81 MG tablet Take 81 mg by mouth daily.    [provider]  botulinum toxin Type A (BOTOX) 100 units SOLR injection Inject 100 Units into the muscle every 3 (three) months.    [provider]  Brinzolamide-Brimonidine (SIMBRINZA) 1-0.2 % SUSP Apply 1 drop to eye at bedtime.    [provider]  cetirizine (ZYRTEC) 10 MG tablet Take 10 mg by mouth daily.    [provider]  diclofenac Sodium (VOLTAREN) 1 % GEL Apply 4 g topically 4 (four) times daily as needed. 02/26/19   Marcial Pacas, MD  Esomeprazole Magnesium (NEXIUM PO) Take 1 tablet by mouth daily.    [provider]  fluticasone (FLONASE) 50 MCG/ACT nasal spray Place 2 sprays into both nostrils as needed. SHAKE AS DIRECTED     [provider]  Fluticasone-Salmeterol (ADVAIR DISKUS) 250-50 MCG/DOSE AEPB Inhale 1 puff into the lungs 2 (two) times daily.    [provider]  gabapentin (NEURONTIN) 100 MG capsule Take 2 capsules in am, 2 capsules midday, 1 capsule at bedtime. 01/30/18   Marcial Pacas, MD  ibandronate (BONIVA) 150 MG tablet Take 150 mg by mouth every 30  (thirty) days. Take in the morning with a full glass of water, on an empty stomach, and do not take anything else by mouth or lie down for the next 30 min.    [provider]  insulin glargine (LANTUS) 100 UNIT/ML injection Inject 12 Units into the skin at bedtime. 9 pm daily    [provider]  lidocaine-prilocaine (EMLA) cream Apply 1 application topically 4 (four) times daily as needed. 02/26/19   Marcial Pacas, MD  metFORMIN (GLUCOPHAGE) 1000 MG tablet Take 1,000 mg by mouth 2 (two) times daily with a meal.    [provider]  MYRBETRIQ 25 MG TB24 tablet Take 25 mg by mouth daily.  01/03/18   [provider]  sertraline (ZOLOFT) 50 MG tablet Take 50 mg by mouth 2 (two) times daily.  [provider]  trimethoprim (TRIMPEX) 100 MG tablet Take 1 tablet (100 mg total) by mouth daily. OK TO RESUME AFTER YOU FINISH CURRENT ANTIBIOTIC THERAPY. 03/01/15   Barton Dubois, MD  vitamin B-12 (CYANOCOBALAMIN) 1000 MCG tablet Take 1,000 mcg by mouth daily.    [provider]    Allergies    Patient has no known allergies.  Review of Systems   Review of Systems  Constitutional: Positive for chills and fatigue. Negative for diaphoresis and fever.  HENT: Positive for congestion.   Eyes: Negative for visual disturbance.  Respiratory: Positive for cough. Negative for chest tightness, shortness of breath and wheezing.   Cardiovascular: Negative for chest pain, palpitations and leg swelling.  Gastrointestinal: Positive for diarrhea (resolved). Negative for abdominal pain, constipation, nausea and vomiting.  Genitourinary: Negative for dysuria and flank pain.  Musculoskeletal: Negative for back pain, neck pain and neck stiffness.  Skin: Negative for rash and wound.  Neurological: Negative for dizziness, loss of consciousness, weakness, light-headedness and headaches.  Psychiatric/Behavioral: Negative for agitation and confusion.  All other systems reviewed  and are negative.   Physical Exam Updated Vital Signs There were no vitals taken for this visit.  Physical Exam Vitals and nursing note reviewed.  Constitutional:      General: She is not in acute distress.    Appearance: She is well-developed. She is not ill-appearing, toxic-appearing or diaphoretic.  HENT:     Head: Normocephalic and atraumatic.     Right Ear: External ear normal.     Left Ear: External ear normal.     Nose: Nose normal. No congestion or rhinorrhea.     Mouth/Throat:     Mouth: Mucous membranes are dry.     Pharynx: No oropharyngeal exudate or posterior oropharyngeal erythema.  Eyes:     Conjunctiva/sclera: Conjunctivae normal.     Pupils: Pupils are equal, round, and reactive to light.  Cardiovascular:     Rate and Rhythm: Normal rate and regular rhythm.     Pulses: Normal pulses.     Heart sounds: No murmur.  Pulmonary:     Effort: Pulmonary effort is normal. No respiratory distress.     Breath sounds: Normal breath sounds. No stridor. No wheezing, rhonchi or rales.  Chest:     Chest wall: No tenderness.  Abdominal:     General: There is no distension.     Tenderness: There is no abdominal tenderness. There is no right CVA tenderness, left CVA tenderness or rebound.  Musculoskeletal:        General: No tenderness.     Cervical back: Normal range of motion and neck supple.     Right lower leg: No edema.     Left lower leg: No edema.  Skin:    General: Skin is warm.     Findings: No erythema or rash.  Neurological:     General: No focal deficit present.     Mental Status: She is alert and oriented to person, place, and time.     Motor: No abnormal muscle tone.     Coordination: Coordination normal.     Deep Tendon Reflexes: Reflexes are normal and symmetric.  Psychiatric:        Mood and Affect: Mood normal.     ED Results / Procedures / Treatments   Labs (all labs ordered are listed, but only abnormal results are displayed) Labs Reviewed    CBC WITH DIFFERENTIAL/PLATELET - Abnormal; Notable for the following components:  Result Value   Abs Immature Granulocytes 0.38 (*)    All other components within normal limits  COMPREHENSIVE METABOLIC PANEL - Abnormal; Notable for the following components:   Glucose, Bld 126 (*)    BUN 31 (*)    Albumin 3.1 (*)    AST 13 (*)    GFR calc non Af Amer 56 (*)    All other components within normal limits  URINE CULTURE  CULTURE, BLOOD (ROUTINE X 2)  CULTURE, BLOOD (ROUTINE X 2)  LACTIC ACID, PLASMA  LACTIC ACID, PLASMA  LIPASE, BLOOD  URINALYSIS, ROUTINE W REFLEX MICROSCOPIC  TSH    EKG EKG Interpretation  Date/Time:  Friday April 17 2019 12:31:17 EST Ventricular Rate:  95 PR Interval:    QRS Duration: 153 QT Interval:  417 QTC Calculation: 525 R Axis:   -75 Text Interpretation: Baseline wander in lead(s) V1 When compared to prior, similar paced rhythm NO STEMI Confirmed by Antony Blackbird [id number]) on 04/17/2019 12:40:25 PM   Radiology DG Chest Portable 1 View  Result Date: 04/17/2019 CLINICAL DATA:  COVID positive.  Cough. EXAM: PORTABLE CHEST 1 VIEW COMPARISON:  02/24/2015. FINDINGS: Mediastinum and hilar structures normal. Cardiac pacer with lead tip over the right atrium and right ventricle. Sliding hiatal hernia. Right upper lobe infiltrate noted today's exam. Mild bibasilar atelectasis and or scarring. Calcified nodule left base consistent with calcified granuloma. Elevation right hemidiaphragm. No pleural effusion or pneumothorax. IMPRESSION: Right upper lobe infiltrate consistent with pneumonia. Electronically Signed   By: Marcello Moores  Register   On: 04/17/2019 13:17    Procedures Procedures (including critical care time)  Medications Ordered in ED Medications  cefTRIAXone (ROCEPHIN) 1 g in sodium chloride 0.9 % 100 mL IVPB (has no administration in time range)  azithromycin (ZITHROMAX) 500 mg in sodium chloride 0.9 % 250 mL IVPB (has no administration in time range)   sodium chloride 0.9 % bolus 500 mL (0 mLs Intravenous Stopped 04/17/19 1449)    ED Course  I have reviewed the triage vital signs and the nursing notes.  Pertinent labs & imaging results that were available during my care of the patient were reviewed by me and considered in my medical decision making (see chart for details).    MDM Rules/Calculators/A&P                      Wendy Velasquez is a 86 y.o. female with a past medical history significant for prior complete heart block with cardiac arrest that is post pacemaker dependence, hypertension, hyperlipidemia, COPD, diabetes, GERD, and recent diagnosis of coronavirus who presents with fatigue, decreased appetite, malaise, continued productive cough, and an episode of hypoxia at PCP. Patient reports that she has been feeling very fatigued and tired for the last few days. She saw her PCP today who found her to have an oxygen saturation one time in the 80s prompting her to call EMS to bring her to the emergency department. EMS reports no hypoxia and patient has not been hypoxic in the emergency department thus far. Patient reports she had some diarrhea the other day but has had improvement in that. She thinks she may be slightly dehydrated. She reports no nausea or vomiting. She denies any pain in her chest or abdomen. She does report some chronic neck pain which is unchanged. She reports no appetite and has no energy. She denies other complaints.  On exam, lungs are clear and chest is nontender. Abdomen is nontender. Patient is warm  to the touch. Patient had no stridor. She denies any choking episodes. She had no focal neurologic deficits on my initial exam. She just feels very tired and fatigued.  Due to her diarrhea, decreased oral intake, and feeling tired and fatigued, will get labs to make sure she is not dehydrated. Will get chest x-ray given the continued productive cough. As she is not hypoxic, will monitor here and see if she needs oxygen.  If she remains off of oxygen and her work-up is reassuring, dissipate discharge home. She will given a small mount of fluids during her initial work-up for rehydration given the diarrhea and decreased oral intake.  Anticipate reassessment.  4:18 PM Patient was ambulated with nursing and they report that she was only able to take several steps for she got very winded.  Although she did not get hypoxic, she felt terrible and had to stop ambulating.  I spoke with the daughter who is very concerned about her with the diarrhea which the daughter reports is still constant and the fatigue.  The daughter reports she has had a nasty cough with yellow sputum and they are very concerned about sending her home given the hypoxia she displayed with the PCP earlier today.  We are still awaiting urinalysis to return however the patient's lab work is returned somewhat reassuring.  Her kidney function and liver function are not elevated and she does not have a leukocytosis or anemia.  Lactic acid was normal both times we checked it.  Her chest x-ray unfortunately does show a new right upper lobe pneumonia.  Given the chills, the patient's productive cough, and the imaging, I am concerned she may have a bacterial pneumonia as well as Covid.  Given the documented saturation in the 80s, the new pneumonia in this 86 year old patient, the worsening fatigue and continued diarrhea, anticipate admission for further management after urinalysis is completed.  Care transferred to oncoming team while awaiting for results of urinalysis.  Patient will be admitted.  Final Clinical Impression(s) / ED Diagnoses Final diagnoses:  COVID-19  Community acquired pneumonia, unspecified laterality  Fatigue, unspecified type  Diarrhea, unspecified type    Clinical Impression: 1. COVID-19   2. Community acquired pneumonia, unspecified laterality   3. Fatigue, unspecified type   4. Diarrhea, unspecified type     Disposition:  Admit  This note was prepared with assistance of Dragon voice recognition software. Occasional wrong-word or sound-a-like substitutions may have occurred due to the inherent limitations of voice recognition software.      Tenia Goh, Gwenyth Allegra, MD 04/17/19 [phone]

## 2019-04-17 NOTE — ED Triage Notes (Signed)
Pt BIBA from PCP office.   Per EMS-  Pt dx 1/25 w/ COVID.  Since then pt reports fatigue, loss of appetite, cough.  Pt reports worsening sx starting yesterday. PCP office reports O2 sats at 83% on RA.  EMS reports monitoring O2 sats, which never decreased lower than 97%.

## 2019-04-17 NOTE — H&P (Addendum)
History and Physical  Wendy Velasquez [id number] DOB: [birth year] DOA: 04/17/2019  Referring physician: Dr. Wilson Singer, EDP  PCP: Osborne Casco Fransico Him, MD  Outpatient Specialists: Cardiology. Patient coming from: Home.  Sent from her PCPs office.  Chief Complaint: Hypoxia at PCPs office.  HPI: Wendy Velasquez is a 86 y.o. female with medical history significant for prior complete heart block with cardiac arrest status post pacemaker placement, hypertension, hyperlipidemia, COPD, type 2 diabetes, GERD, COVID-19 viral infection on 04/04/2019 who presented with complaints of generalized weakness, decreased appetite, poor oral intake and productive cough.  Went to see her PCP today due to presenting symptoms.  She was referred by her PCP due to hypoxia in the office.  O2 saturation dropped to the 80s on room air and she was dyspneic with ambulation.  Symptoms associated with intermittent diarrhea.  Denies chest pain or pleuritic pain.  TRH asked to admit.   ED Course: No hypoxia documented in the ED.  She is on RA.  T-max 99.3.  Weak appearing on exam.  Chest x-ray done shows right upper lobe infiltrate suggestive of lobular pneumonia.  Started on IV antibiotics empirically for community-acquired pneumonia.  IV azithromycin switched to IV doxycycline due to prolonged QTC on 12 lead EKG.  Review of Systems: Review of systems as noted in the HPI. All other systems reviewed and are negative.   Past Medical History:  Diagnosis Date  . Asthma   . Cardiac pacemaker in situ   . Complete heart block ([place])   . COPD (chronic obstructive pulmonary disease) ([place])   . Diverticulosis of colon   . GERD (gastroesophageal reflux disease)   . Glaucoma of both eyes   . Heart disease   . History of cardiac arrest    09-11-2012--  symptomatic complete heart block -- hr 20's--  cpr, intubated and temp. pacer until  perm. pacemaker placement  . History of esophageal dilatation    for stricture 2007  . History of  hiatal hernia   . History of iron deficiency anemia    hx transfusion's 15 yrs ago , approx 2001  . Hyperlipidemia   . Hypertension   . Neck pain   . Peripheral neuropathy   . Tachy-brady syndrome ([place])   . Type 2 diabetes mellitus ([place])   . Urgency incontinence    Botox injections for bladder.  . Wears glasses    Past Surgical History:  Procedure Laterality Date  . ABDOMINAL HYSTERECTOMY  1976  approx   w/ unilateral salpingoophorectomy  . CATARACT EXTRACTION W/ INTRAOCULAR LENS  IMPLANT, BILATERAL  1990  . COLONOSCOPY WITH ESOPHAGOGASTRODUODENOSCOPY (EGD)  last one 2007  . INTERSTIM IMPLANT PLACEMENT  2012  . INTERSTIM IMPLANT REMOVAL N/A 09/21/2014   Procedure: REMOVAL OF INTERSTIM IMPLANT;  Surgeon: Bjorn Loser, MD;  Location: [facility];  Service: Urology;  Laterality: N/A;  . INTERSTIM IMPLANT REVISION N/A 09/21/2014   Procedure: Barrie Lyme STAGE ONE AND TWO;  Surgeon: Bjorn Loser, MD;  Location[facility];  Service: Urology;  Laterality: N/A;  . LAPAROTOMY W/ UNILATERAL SALPINGOOPHORECTOMY  1960's  approx  . PERMANENT PACEMAKER INSERTION N/A 09/11/2012   Procedure: PERMANENT PACEMAKER INSERTION;  Surgeon: Evans Lance, MD;  Location: [facility] CATH LAB;  Service: Cardiovascular;  Laterality: N/A;  St. Jude Dual Chamber  . RECTOCELE REPAIR  1991  approx    Social History:  reports that she has never smoked. She has never used smokeless tobacco. She reports that she does  not drink alcohol or use drugs.   No Known Allergies  Family History  Problem Relation Age of Onset  . Other Mother        died during her childbirth  . Prostatitis Father   . Heart failure Sister   . Breast cancer Sister   . Diabetic kidney disease Sister   . Melanoma Brother       Prior to Admission medications   Medication Sig Start Date End Date Taking? Authorizing Provider  acetaminophen (TYLENOL) 500 MG tablet Take 1,000 mg by mouth every 6 (six) hours as  needed for fever.   Yes [provider]  albuterol (PROVENTIL) (2.5 MG/3ML) 0.083% nebulizer solution Take 2.5 mg by nebulization every 6 (six) hours as needed for wheezing or shortness of breath.   Yes [provider]  albuterol-ipratropium (COMBIVENT) 18-103 MCG/ACT inhaler Inhale 2 puffs into the lungs every 8 (eight) hours as needed for wheezing.    Yes [provider]  aspirin EC 81 MG tablet Take 81 mg by mouth daily.   Yes [provider]  Brinzolamide-Brimonidine (SIMBRINZA) 1-0.2 % SUSP Apply 1 drop to eye at bedtime.   Yes [provider]  cetirizine (ZYRTEC) 10 MG tablet Take 10 mg by mouth daily as needed for allergies.    Yes [provider]  Esomeprazole Magnesium (NEXIUM PO) Take 1 tablet by mouth daily.   Yes [provider]  fluticasone (FLONASE) 50 MCG/ACT nasal spray Place 2 sprays into both nostrils daily as needed for allergies. SHAKE AS DIRECTED    Yes [provider]  Fluticasone-Salmeterol (ADVAIR DISKUS) 250-50 MCG/DOSE AEPB Inhale 1 puff into the lungs 2 (two) times daily.   Yes [provider]  gabapentin (NEURONTIN) 100 MG capsule Take 2 capsules in am, 2 capsules midday, 1 capsule at bedtime. 01/30/18  Yes Marcial Pacas, MD  ibandronate (BONIVA) 150 MG tablet Take 150 mg by mouth every 30 (thirty) days. Take in the morning with a full glass of water, on an empty stomach, and do not take anything else by mouth or lie down for the next 30 min.   Yes [provider]  insulin glargine (LANTUS) 100 UNIT/ML injection Inject 12 Units into the skin at bedtime. 9 pm daily   Yes [provider]  lidocaine-prilocaine (EMLA) cream Apply 1 application topically 4 (four) times daily as needed. Patient taking differently: Apply 1 application topically 4 (four) times daily as needed (pain).  02/26/19  Yes Marcial Pacas, MD  Menthol, Topical Analgesic, (BIOFREEZE) 4 % GEL Apply 1 application  topically daily as needed (pain).   Yes [provider]  metFORMIN (GLUCOPHAGE) 1000 MG tablet Take 1,000 mg by mouth 2 (two) times daily with a meal.   Yes [provider]  MYRBETRIQ 25 MG TB24 tablet Take 25 mg by mouth daily.  01/03/18  Yes [provider]  sertraline (ZOLOFT) 50 MG tablet Take 50 mg by mouth 2 (two) times daily.   Yes [provider]  trimethoprim (TRIMPEX) 100 MG tablet Take 1 tablet (100 mg total) by mouth daily. OK TO RESUME AFTER YOU FINISH CURRENT ANTIBIOTIC THERAPY. 03/01/15  Yes Barton Dubois, MD  vitamin B-12 (CYANOCOBALAMIN) 1000 MCG tablet Take 1,000 mcg by mouth daily.   Yes [provider]  botulinum toxin Type A (BOTOX) 100 units SOLR injection Inject 100 Units into the muscle every 3 (three) months.    [provider]  diclofenac Sodium (VOLTAREN) 1 % GEL Apply  4 g topically 4 (four) times daily as needed. Patient not taking: Reported on 04/17/2019 02/26/19   Marcial Pacas, MD    Physical Exam: BP 136/62   Pulse 86   Temp 99.3 F (37.4 C) (Oral)   Resp 16   Ht 5\' 5"  (1.651 m)   Wt 54.8 kg   SpO2 97%   BMI 20.10 kg/m   . General: 86 y.o. year-old female well developed well nourished in no acute distress.  Alert and oriented x3. . Cardiovascular: Regular rate and rhythm with no rubs or gallops.  No thyromegaly or JVD noted.  No lower extremity edema. 2/4 pulses in all 4 extremities. Marland Kitchen Respiratory: Mild rales at bases with no wheezes. Good inspiratory effort. . Abdomen: Soft nontender nondistended with normal bowel sounds x4 quadrants. . Muskuloskeletal: No cyanosis, clubbing or edema noted bilaterally . Neuro: CN II-XII intact, strength, sensation, reflexes . Skin: No ulcerative lesions noted or rashes . Psychiatry: Judgement and insight appear normal. Mood is appropriate for condition and setting          Labs on Admission:  Basic Metabolic Panel: Recent Labs  Lab 04/17/19 1321  NA 137  K 4.5    CL 102  CO2 25  GLUCOSE 126*  BUN 31*  CREATININE 0.90  CALCIUM 9.1   Liver Function Tests: Recent Labs  Lab 04/17/19 1321  AST 13*  ALT 13  ALKPHOS 66  BILITOT 0.6  PROT 6.7  ALBUMIN 3.1*   Recent Labs  Lab 04/17/19 1321  LIPASE 18   No results for input(s): AMMONIA in the last 168 hours. CBC: Recent Labs  Lab 04/17/19 1321  WBC 9.9  NEUTROABS 7.3  HGB 12.0  HCT 36.6  MCV 87.6  PLT 373   Cardiac Enzymes: No results for input(s): CKTOTAL, CKMB, CKMBINDEX, TROPONINI in the last 168 hours.  BNP (last 3 results) No results for input(s): BNP in the last 8760 hours.  ProBNP (last 3 results) No results for input(s): PROBNP in the last 8760 hours.  CBG: No results for input(s): GLUCAP in the last 168 hours.  Radiological Exams on Admission: DG Chest Portable 1 View  Result Date: 04/17/2019 CLINICAL DATA:  COVID positive.  Cough. EXAM: PORTABLE CHEST 1 VIEW COMPARISON:  02/24/2015. FINDINGS: Mediastinum and hilar structures normal. Cardiac pacer with lead tip over the right atrium and right ventricle. Sliding hiatal hernia. Right upper lobe infiltrate noted today's exam. Mild bibasilar atelectasis and or scarring. Calcified nodule left base consistent with calcified granuloma. Elevation right hemidiaphragm. No pleural effusion or pneumothorax. IMPRESSION: Right upper lobe infiltrate consistent with pneumonia. Electronically Signed   By: Marcello Moores  Register   On: 04/17/2019 13:17    EKG: I independently viewed the EKG done and my findings are as followed: Ventricular paced at 95.  Assessment/Plan Present on Admission: . Hypoxia  Active Problems:   Hypoxia  Acute hypoxic respiratory failure likely secondary to recent COVID-19 viral infection superimposed by right upper lobe community-acquired pneumonia, POA Not on O2 supplementation at baseline O2 saturation dropped in the 80s at PCPs office No documented hypoxia in the ED discharge. On RA at the time of this  visit with O2 saturation in the mid 90's Continue to closely monitor and maintain O2 saturation greater than 92% Start bronchodilators albuterol inhaler 2 puffs 3 times daily and ipratropium inhaler 2 puffs 3 times daily Start incentive spirometer and flutter valve  Recent COVID-19 viral infection Documented positive COVID-19 screening test on 04/04/2019. Would continue airborne  precaution for 21 days from first positive COVID-19 test. Obtain inflammatory markers Start vitamin D3, C and zinc Continue bronchodilators as stated above Maintain O2 saturation greater than 92% Monitor fever curve; Tmax 99.3 Afebrile, no leukocytosis at this time  Community-acquired pneumonia in the setting of recent COVID-19 viral infection Personally reviewed chest x-ray done on admission which showed right upper lobe infiltrate suggestive of early right upper lobe community-acquired pneumonia Obtain procalcitonin level in the morning Continue IV doxycycline and Rocephin, started in the ED Rest of management as per above Currently afebrile with no leukocytosis T-max is 99.3. Monitor fever and WBC Repeat CBC with differential in the morning  Generalized weakness likely in the setting of recent COVID-19 viral infection Encourage increase oral protein calorie intake PT OT to assess Fall precautions  QTC prolongation QTC >520 on 12 lead Has pacemaker in place Avoid QTC prolonging agents Optimize electrolytes Repeat 12 lead EKG in the am  S/p pacemaker Denies anginal symptoms  Type 2 diabetes Obtain A1c Start insulin sliding scale Avoid hypoglycemia  COPD Continue bronchodilators Continue to maintain O2 saturation greater than 92%  Chronic anxiety/depression Continue Zoloft  Diabetic neuropathy Continue gabapentin   DVT prophylaxis: Subcu Lovenox daily  Code Status:  Full code per the patient herself.  Family Communication: None   Disposition Plan: Admit to telemetry unit at  [facility].  Consults called: None  Admission status: Inpatient status    Kayleen Memos MD [facility] Pager [phone]  If 7PM-7AM, please contact night-coverage [email or web address] Password [facility]  04/17/2019, 5:54 PM

## 2019-04-17 NOTE — ED Notes (Signed)
Report called to Grayland Ormond, Therapist, sports at [facility].   [facility] notified of need for transportation.

## 2019-04-18 ENCOUNTER — Encounter (HOSPITAL_COMMUNITY): Payer: Self-pay | Admitting: Internal Medicine

## 2019-04-18 DIAGNOSIS — J189 Pneumonia, unspecified organism: Secondary | ICD-10-CM

## 2019-04-18 DIAGNOSIS — R5383 Other fatigue: Secondary | ICD-10-CM

## 2019-04-18 DIAGNOSIS — R197 Diarrhea, unspecified: Secondary | ICD-10-CM

## 2019-04-18 DIAGNOSIS — U071 COVID-19: Principal | ICD-10-CM

## 2019-04-18 LAB — COMPREHENSIVE METABOLIC PANEL
ALT: 13 U/L (ref 0–44)
AST: 14 U/L — ABNORMAL LOW (ref 15–41)
Albumin: 2.9 g/dL — ABNORMAL LOW (ref 3.5–5.0)
Alkaline Phosphatase: 63 U/L (ref 38–126)
Anion gap: 16 — ABNORMAL HIGH (ref 5–15)
BUN: 20 mg/dL (ref 8–23)
CO2: 22 mmol/L (ref 22–32)
Calcium: 8.3 mg/dL — ABNORMAL LOW (ref 8.9–10.3)
Chloride: 99 mmol/L (ref 98–111)
Creatinine, Ser: 0.73 mg/dL (ref 0.44–1.00)
GFR calc Af Amer: >60 mL/min (ref >60)
GFR calc non Af Amer: >60 mL/min (ref >60)
Glucose, Bld: 144 mg/dL — ABNORMAL HIGH (ref 70–99)
Potassium: 4.2 mmol/L (ref 3.5–5.1)
Sodium: 137 mmol/L (ref 135–145)
Total Bilirubin: 0.5 mg/dL (ref 0.3–1.2)
Total Protein: 6.1 g/dL — ABNORMAL LOW (ref 6.5–8.1)

## 2019-04-18 LAB — CBC
HCT: 36 % (ref 36.0–46.0)
Hemoglobin: 11.5 g/dL — ABNORMAL LOW (ref 12.0–15.0)
MCH: 28.3 pg (ref 26.0–34.0)
MCHC: 31.9 g/dL (ref 30.0–36.0)
MCV: 88.5 fL (ref 80.0–100.0)
Platelets: 358 10*3/uL (ref 150–400)
RBC: 4.07 MIL/uL (ref 3.87–5.11)
RDW: 14.5 % (ref 11.5–15.5)
WBC: 9.4 10*3/uL (ref 4.0–10.5)
nRBC: 0 % (ref 0.0–0.2)

## 2019-04-18 LAB — GLUCOSE, CAPILLARY
Glucose-Capillary: 141 mg/dL — ABNORMAL HIGH (ref 70–99)
Glucose-Capillary: 177 mg/dL — ABNORMAL HIGH (ref 70–99)
Glucose-Capillary: 213 mg/dL — ABNORMAL HIGH (ref 70–99)
Glucose-Capillary: 124 mg/dL — ABNORMAL HIGH (ref 70–99)

## 2019-04-18 LAB — MRSA PCR SCREENING: MRSA by PCR: NEGATIVE

## 2019-04-18 LAB — SAMPLE TO BLOOD BANK

## 2019-04-18 LAB — C-REACTIVE PROTEIN: CRP: 8 mg/dL — ABNORMAL HIGH (ref <1.0)

## 2019-04-18 LAB — BRAIN NATRIURETIC PEPTIDE: B Natriuretic Peptide: 112.6 pg/mL — ABNORMAL HIGH (ref 0.0–100.0)

## 2019-04-18 LAB — URINE CULTURE: Culture: NO GROWTH

## 2019-04-18 LAB — HEMOGLOBIN A1C
Hgb A1c MFr Bld: 6.9 % — ABNORMAL HIGH (ref 4.8–5.6)
Mean Plasma Glucose: 151.33 mg/dL

## 2019-04-18 LAB — D-DIMER, QUANTITATIVE: D-Dimer, Quant: 1.47 ug/mL-FEU — ABNORMAL HIGH (ref 0.00–0.50)

## 2019-04-18 LAB — PROCALCITONIN: Procalcitonin: <0.1 ng/mL

## 2019-04-18 MED ORDER — SODIUM CHLORIDE 0.9 % IV SOLN
100.0000 mg | Freq: Every day | INTRAVENOUS | Status: AC
  Administered 2019-04-19 – 2019-04-22 (×4): 100 mg via INTRAVENOUS
  Filled 2019-04-18 (×4): qty 20

## 2019-04-18 MED ORDER — SODIUM CHLORIDE 0.9 % IV SOLN
200.0000 mg | Freq: Once | INTRAVENOUS | Status: AC
  Administered 2019-04-18: 200 mg via INTRAVENOUS
  Filled 2019-04-18: qty 40

## 2019-04-18 NOTE — Progress Notes (Signed)
Pt left foot redness with weaker pulse than right foot. Pt denies tenderness or pain with movement or palpation. MD notified, MD states will check D-dimer lab in the morning. Will continue to monitor.

## 2019-04-18 NOTE — Evaluation (Signed)
Physical Therapy Evaluation Patient Details Name: [name] MRN: [id number] DOB: 18-Jan-1926 Today's Date: 04/18/2019   History of Present Illness  86 y.o. female with medical history significant for prior complete heart block with cardiac arrest status post pacemaker placement, hypertension, hyperlipidemia, COPD, type 2 diabetes, GERD, COVID-19 viral infection on 04/04/2019 who presented with complaints of generalized weakness, decreased appetite, poor oral intake and productive cough.  Clinical Impression   Pt admitted with above diagnosis. PTA was living with spouse and was independent using walker. Pt currently with functional limitations due to the deficits listed below (see PT Problem List). Pt slightly lethargic this pm, needing min a with bed mob and mod a with transfers from bed/recliner and commode. Was able to ambulate approx 37ft with HHA and mod a in room. Pt fatigues very quickly needing frequent rest breaks. Pt was on room air throughout had fluctuating 02 saturations, attempted to change probe from finger to earlobe but did not get any better reading. At rest pt sats in 90s. Pt will benefit from skilled PT to increase their independence and safety with mobility to allow discharge to the venue listed below.       Follow Up Recommendations Home health PT    Equipment Recommendations  Rolling walker with 5" wheels    Recommendations for Other Services       Precautions / Restrictions Precautions Precautions: None Precaution Comments: R knee pain for years, causes difficulty walking Restrictions Weight Bearing Restrictions: No      Mobility  Bed Mobility Overal bed mobility: Needs Assistance Bed Mobility: Supine to Sit;Sit to Supine     Supine to sit: Min guard Sit to supine: Min guard      Transfers Overall transfer level: Needs assistance Equipment used: 1 person hand held assist Transfers: Sit to/from [facility] Sit to Stand: Mod  assist Stand pivot transfers: Mod assist          Ambulation/Gait Ambulation/Gait assistance: Mod assist Gait Distance (Feet): 32 Feet Assistive device: 1 person hand held assist;2 person hand held assist Gait Pattern/deviations: Antalgic;Shuffle;Step-through pattern        Financial trader Rankin (Stroke Patients Only)       Balance Overall balance assessment: Needs assistance Sitting-balance support: Feet supported;Bilateral upper extremity supported Sitting balance-Leahy Scale: Poor Sitting balance - Comments: falling backwards while sitting edge of bed Postural control: Posterior lean Standing balance support: During functional activity;Single extremity supported Standing balance-Leahy Scale: Poor                               Pertinent Vitals/Pain Pain Assessment: Faces Faces Pain Scale: Hurts little more Pain Location: w/ increased labour of breathing and some ambulation    Home Living Family/patient expects to be discharged to:: Assisted living               Home Equipment: Walker - 2 wheels;Walker - 4 wheels;Grab bars - toilet;Grab bars - tub/shower;Shower seat Additional Comments: Independent living at [facility]. Daughter available to help 24/7, ahs been helping  her and husband since they got sick    Prior Function Level of Independence: Independent with assistive device(s)               Hand Dominance   Dominant Hand: Right    Extremity/Trunk Assessment   Upper Extremity Assessment Upper Extremity  Assessment: Generalized weakness            Communication   Communication: No difficulties  Cognition Arousal/Alertness: Lethargic Behavior During Therapy: WFL for tasks assessed/performed Overall Cognitive Status: No family/caregiver present to determine baseline cognitive functioning                                        General Comments       Exercises     Assessment/Plan    PT Assessment Patient needs continued PT services  PT Problem List Decreased strength;Decreased activity tolerance;Decreased balance;Decreased mobility;Decreased coordination;Decreased knowledge of use of DME;Decreased safety awareness       PT Treatment Interventions Gait training;DME instruction;Functional mobility training;Therapeutic activities;Therapeutic exercise;Balance training;Neuromuscular re-education;Patient/family education    PT Goals (Current goals can be found in the Care Plan section)  Acute Rehab PT Goals Patient Stated Goal: get home to spouse PT Goal Formulation: With patient Time For Goal Achievement: 05/02/19 Potential to Achieve Goals: Fair    Frequency Min 3X/week   Barriers to discharge        Co-evaluation               AM-PAC PT "6 Clicks" Mobility  Outcome Measure Help needed turning from your back to your side while in a flat bed without using bedrails?: A Little Help needed moving from lying on your back to sitting on the side of a flat bed without using bedrails?: A Little Help needed moving to and from a bed to a chair (including a wheelchair)?: A Little Help needed standing up from a chair using your arms (e.g., wheelchair or bedside chair)?: A Lot Help needed to walk in hospital room?: A Lot Help needed climbing 3-5 steps with a railing? : A Lot 6 Click Score: 15    End of Session Equipment Utilized During Treatment: Gait belt Activity Tolerance: Patient limited by fatigue;Patient limited by lethargy;Treatment limited secondary to medical complications (Comment) Patient left: in bed;with call bell/phone within reach Nurse Communication: Mobility status PT Visit Diagnosis: Unsteadiness on feet (R26.81);Other abnormalities of gait and mobility (R26.89);Muscle weakness (generalized) (M62.81)    Time: [id number] PT Time Calculation (min) (ACUTE ONLY): 24 min   Charges:   PT Evaluation $PT Eval  Moderate Complexity: 1 Mod PT Treatments $Therapeutic Activity: 8-22 mins        Horald Chestnut, PT   Delford Field 04/18/2019, 3:29 PM

## 2019-04-18 NOTE — Plan of Care (Signed)
  Problem: Education: Goal: Knowledge of risk factors and measures for prevention of condition will improve Outcome: Progressing   Problem: Coping: Goal: Psychosocial and spiritual needs will be supported Outcome: Progressing   Problem: Respiratory: Goal: Will maintain a patent airway Outcome: Progressing Goal: Complications related to the disease process, condition or treatment will be avoided or minimized Outcome: Progressing   

## 2019-04-18 NOTE — Progress Notes (Signed)
Occupational Therapy Evaluation Patient Details Name: Wendy Velasquez MRN: [id number] DOB: [birth year] Today's Date: 04/18/2019    History of Present Illness 86 y.o. female with medical history significant for prior complete heart block with cardiac arrest status post pacemaker placement, hypertension, hyperlipidemia, COPD, type 2 diabetes, GERD, COVID-19 viral infection on 04/04/2019 who presented with complaints of generalized weakness, decreased appetite, poor oral intake and productive cough.   Clinical Impression   Patient very kind and willing to participate, though quite tired during session.  Patient lives with husband at [place] in independent living.  She has chronic R knee pain and walks with a walker.   Patient remained on room air throughout session and SpO2> 96.  Patient required min assist with bed mobility and mod with transfers.  She demonstrated poor activity tolerance as the transfer exhausted her.  She completed seated grooming with set up.  Patient would benefit from OT to increase strength and activity tolerance for increased independence with ADLs. Will continue to follow with OT acutely to address the deficits listed below.        Follow Up Recommendations  Home health OT;Other (comment);Supervision - Intermittent(Return to ALF)    Equipment Recommendations  None recommended by OT    Recommendations for Other Services       Precautions / Restrictions Precautions Precautions: None Precaution Comments: R knee pain for years, causes difficulty walking Restrictions Weight Bearing Restrictions: No      Mobility Bed Mobility Overal bed mobility: Needs Assistance Bed Mobility: Supine to Sit     Supine to sit: Min assist        Transfers Overall transfer level: Needs assistance Equipment used: Rolling walker (2 wheeled) Transfers: Sit to/from [facility] Sit to Stand: Min assist Stand pivot transfers: Mod assist             Balance Overall balance assessment: Needs assistance Sitting-balance support: No upper extremity supported;Feet supported Sitting balance-Leahy Scale: Fair     Standing balance support: Bilateral upper extremity supported;During functional activity Standing balance-Leahy Scale: Poor                             ADL either performed or assessed with clinical judgement   ADL Overall ADL's : Needs assistance/impaired Eating/Feeding: Independent;Sitting   Grooming: Wash/dry hands;Wash/dry face;Oral care;Set up;Sitting   Upper Body Bathing: Min guard;Sitting   Lower Body Bathing: Moderate assistance;Sit to/from stand   Upper Body Dressing : Set up;Sitting   Lower Body Dressing: Moderate assistance;Sit to/from stand   Toilet Transfer: Moderate assistance;RW           Functional mobility during ADLs: Minimal assistance;Rolling walker General ADL Comments: Patient very tired/fatigued during session     Vision         Perception     Praxis      Pertinent Vitals/Pain Pain Assessment: No/denies pain     Hand Dominance Right   Extremity/Trunk Assessment Upper Extremity Assessment Upper Extremity Assessment: Generalized weakness           Communication Communication Communication: No difficulties   Cognition Arousal/Alertness: Awake/alert Behavior During Therapy: WFL for tasks assessed/performed Overall Cognitive Status: Within Functional Limits for tasks assessed                                     General Comments  Exercises     Shoulder Instructions      Home Living Family/patient expects to be discharged to:: Assisted living                             Home Equipment: Gilford Rile - 2 wheels;Walker - 4 wheels;Grab bars - toilet;Grab bars - tub/shower;Shower seat   Additional Comments: Independent living at [facility]. Daughter available to help 24/7, ahs been helping  her and husband since they got  sick      Prior Functioning/Environment Level of Independence: Independent with assistive device(s)                 OT Problem List: Decreased strength;Decreased activity tolerance;Impaired balance (sitting and/or standing)      OT Treatment/Interventions: Self-care/ADL training;Therapeutic exercise;Energy conservation;Therapeutic activities;Balance training;Patient/family education    OT Goals(Current goals can be found in the care plan section) Acute Rehab OT Goals Patient Stated Goal: Go back to husband OT Goal Formulation: With patient Time For Goal Achievement: 05/02/19 Potential to Achieve Goals: Good  OT Frequency: Min 2X/week   Barriers to D/C:            Co-evaluation              AM-PAC OT "6 Clicks" Daily Activity     Outcome Measure Help from another person eating meals?: None Help from another person taking care of personal grooming?: A Little Help from another person toileting, which includes using toliet, bedpan, or urinal?: A Lot Help from another person bathing (including washing, rinsing, drying)?: A Lot Help from another person to put on and taking off regular upper body clothing?: A Little Help from another person to put on and taking off regular lower body clothing?: A Lot 6 Click Score: 16   End of Session Equipment Utilized During Treatment: Gait belt;Rolling walker Nurse Communication: Mobility status  Activity Tolerance: Patient limited by fatigue Patient left: in chair;with call bell/phone within reach;with chair alarm set  OT Visit Diagnosis: Unsteadiness on feet (R26.81);Muscle weakness (generalized) (M62.81)                Time: [id number] OT Time Calculation (min): 34 min Charges:  OT General Charges $OT Visit: 1 Visit OT Evaluation $OT Eval Moderate Complexity: 1 Mod OT Treatments $Self Care/Home Management : 8-22 mins  Wendy Velasquez, OTR/L    Wendy Velasquez 04/18/2019, 2:55 PM

## 2019-04-18 NOTE — Progress Notes (Signed)
PROGRESS NOTE  Wendy Velasquez [id number] DOB: 1925-09-06 DOA: 04/17/2019 PCP: [name], MD   LOS: 1 day   Brief Narrative / Interim history: 86 y.o. female with medical history significant for prior complete heart block with cardiac arrest status post pacemaker placement, hypertension, hyperlipidemia, COPD, type 2 diabetes, GERD, COVID-19 viral infection on 04/04/2019 who presented with complaints of generalized weakness, decreased appetite, poor oral intake and productive cough. Went to see her PCP today due to presenting symptoms.  She was referred by her PCP due to hypoxia in the office.  O2 saturation dropped to the 80s on room air and she was dyspneic with ambulation. She was admitted to [facility].   Subjective / 24h Interval events: -feeling well, denies shortness of breath, no chest pain, no abdominal pain, no nausea or vomiting  Assessment & Plan:  Principal Problem Acute Hypoxic Respiratory Failure due to Covid-19 Viral Illness -Hypoxic in the PCPs office, however here she has been stable on room air -Continue to monitor and continuous pulse ox -Given respiratory symptoms at home with cough and shortness of breath, COVID-19 positive will start remdesivir.  No role for steroids given lack of hypoxia -Continue to monitor inflammatory markers   COVID-19 Labs  Recent Labs    04/18/19 0630  DDIMER 1.47*   No results found for: SARSCOV2NAA  Active Problems Concern for bacterial community-acquired pneumonia in the setting of recent COVID-19 viral infection -Chest x-ray on admission shows right upper lobe infiltrate suggesting lobar pneumonia -She was started on ceftriaxone, however she does have leukocytosis, is afebrile, and procalcitonin is negative.  Bacterial pneumonia ruled out.  Stop antibiotics as this is most likely viral  Generalized weakness in the setting of recent viral infection -PT/OT consult pending -She lives in independent  living  Heart block status post pacemaker/QTC prolongation -Stable, avoid QT prolonging agents  Type 2 diabetes mellitus -Hemoglobin A1c 6.9, continue sliding scale  CBG (last 3)  Recent Labs    04/17/19 2128 04/17/19 2247 04/18/19 0748  GLUCAP 127* 144* 141*   History of COPD -Continue bronchodilators, no wheezing, stable  Chronic anxiety/depression -Continue home medications   Scheduled Meds: . vitamin C  500 mg Oral Daily  . aspirin EC  81 mg Oral Daily  . cholecalciferol  1,000 Units Oral Daily  . enoxaparin (LOVENOX) injection  40 mg Subcutaneous Q24H  . gabapentin  100 mg Oral QHS  . gabapentin  200 mg Oral BID WC  . insulin aspart  0-5 Units Subcutaneous QHS  . insulin aspart  0-9 Units Subcutaneous TID WC  . Ipratropium-Albuterol  1 puff Inhalation TID  . mirabegron ER  25 mg Oral Daily  . mometasone-formoterol  2 puff Inhalation BID  . pantoprazole  40 mg Oral Daily  . sertraline  50 mg Oral BID  . vitamin B-12  1,000 mcg Oral Daily  . zinc sulfate  220 mg Oral Daily   Continuous Infusions: . cefTRIAXone (ROCEPHIN)  IV    . doxycycline (VIBRAMYCIN) IV Stopped (04/18/19 0715)  . [START ON 04/19/2019] remdesivir 100 mg in NS 100 mL     PRN Meds:.acetaminophen  DVT prophylaxis: Lovenox Code Status: Full code per patient, confirmed this again with her Family Communication: d/w daughter Pershing Cox [phone] Patient admitted from: ILF Anticipated d/c place: TBD Barriers to d/c: Intravenous remdesivir  Consultants:  None  Procedures:  None   Microbiology: None   Antibacterials: None    Objective: Vitals:   04/17/19 2100  04/17/19 2219 04/18/19 0310 04/18/19 0837  BP: (!) 158/69 (!) 144/58 (!) 127/45 (!) 123/51  Pulse: 89 91 93 87  Resp: 16 14 20 19   Temp:  98.4 F (36.9 C) 98.7 F (37.1 C) 97.9 F (36.6 C)  TempSrc:  Oral Oral Oral  SpO2: 95% 98% 94% 97%  Weight:      Height:        Intake/Output Summary (Last 24 hours) at  04/18/2019 1013 Last data filed at 04/18/2019 0300 Gross per 24 hour  Intake 850.22 ml  Output --  Net 850.22 ml   Filed Weights   04/17/19 1230  Weight: 54.8 kg    Examination:  Constitutional: NAD Eyes: no scleral icterus ENMT: Mucous membranes are moist.  Neck: normal, supple Respiratory: clear to auscultation bilaterally, no wheezing, no crackles. Normal respiratory effort. No accessory muscle use.  Cardiovascular: Regular rate and rhythm, no murmurs / rubs / gallops. No LE edema.  Abdomen: non distended, no tenderness. Bowel sounds positive.  Musculoskeletal: no clubbing / cyanosis.  Skin: no rashes Neurologic: CN 2-12 grossly intact. Strength 5/5 in all 4.  Psychiatric: Normal judgment and insight.     Data Reviewed: I have independently reviewed following labs and imaging studies   CBC: Recent Labs  Lab 04/17/19 1321 04/18/19 0630  WBC 9.9 9.4  NEUTROABS 7.3  --   HGB 12.0 11.5*  HCT 36.6 36.0  MCV 87.6 88.5  PLT 373 [id number]   Basic Metabolic Panel: Recent Labs  Lab 04/17/19 1321 04/18/19 0630  NA 137 137  K 4.5 4.2  CL 102 99  CO2 25 22  GLUCOSE 126* 144*  BUN 31* 20  CREATININE 0.90 0.73  CALCIUM 9.1 8.3*   GFR: Estimated Creatinine Clearance: 39.6 mL/min (by C-G formula based on SCr of 0.73 mg/dL). Liver Function Tests: Recent Labs  Lab 04/17/19 1321 04/18/19 0630  AST 13* 14*  ALT 13 13  ALKPHOS 66 63  BILITOT 0.6 0.5  PROT 6.7 6.1*  ALBUMIN 3.1* 2.9*   Recent Labs  Lab 04/17/19 1321  LIPASE 18   No results for input(s): AMMONIA in the last 168 hours. Coagulation Profile: No results for input(s): INR, PROTIME in the last 168 hours. Cardiac Enzymes: No results for input(s): CKTOTAL, CKMB, CKMBINDEX, TROPONINI in the last 168 hours. BNP (last 3 results) No results for input(s): PROBNP in the last 8760 hours. HbA1C: Recent Labs    04/18/19 0630  HGBA1C 6.9*   CBG: Recent Labs  Lab 04/17/19 2128 04/17/19 2247 04/18/19 0748   GLUCAP 127* 144* 141*   Lipid Profile: No results for input(s): CHOL, HDL, LDLCALC, TRIG, CHOLHDL, LDLDIRECT in the last 72 hours. Thyroid Function Tests: Recent Labs    04/17/19 1321  TSH 1.767   Anemia Panel: No results for input(s): VITAMINB12, FOLATE, FERRITIN, TIBC, IRON, RETICCTPCT in the last 72 hours. Urine analysis:    Component Value Date/Time   COLORURINE YELLOW 04/17/2019 [place] 04/17/2019 1643   LABSPEC 1.020 04/17/2019 1643   PHURINE 5.0 04/17/2019 1643   GLUCOSEU NEGATIVE 04/17/2019 1643   [place] 04/17/2019 [place] 04/17/2019 1643   [place] 04/17/2019 1643   PROTEINUR NEGATIVE 04/17/2019 1643   UROBILINOGEN 0.2 09/11/2012 0927   NITRITE NEGATIVE 04/17/2019 1643   LEUKOCYTESUR NEGATIVE 04/17/2019 1643   Sepsis Labs: Invalid input(s): PROCALCITONIN, LACTICIDVEN  Recent Results (from the past 240 hour(s))  Blood culture (routine x 2)     Status:  None (Preliminary result)   Collection Time: 04/17/19  5:01 PM   Specimen: Right Antecubital; Blood  Result Value Ref Range Status   Specimen Description   Final    RIGHT ANTECUBITAL Performed at [facility] [address], [place], [place] [address]    Special Requests   Final    BOTTLES DRAWN AEROBIC AND ANAEROBIC Blood Culture results may not be optimal due to an excessive volume of blood received in culture bottles Performed at [facility] [address] [place], [place] [address]    Culture   Final    NO GROWTH < 12 HOURS Performed at [facility] [address], [place], [place] [address]    Report Status PENDING  Incomplete  MRSA PCR Screening     Status: None   Collection Time: 04/18/19  1:23 AM   Specimen: Nasopharyngeal  Result Value Ref Range Status   MRSA by PCR NEGATIVE NEGATIVE Final    Comment:        The GeneXpert MRSA Assay (FDA approved for NASAL specimens only), is one component  of a comprehensive MRSA colonization surveillance program. It is not intended to diagnose MRSA infection nor to guide or monitor treatment for MRSA infections. Performed at [facility], [place] [address], [place], [place] [address]       Radiology Studies: DG Chest Portable 1 View  Result Date: 04/17/2019 CLINICAL DATA:  COVID positive.  Cough. EXAM: PORTABLE CHEST 1 VIEW COMPARISON:  02/24/2015. FINDINGS: Mediastinum and hilar structures normal. Cardiac pacer with lead tip over the right atrium and right ventricle. Sliding hiatal hernia. Right upper lobe infiltrate noted today's exam. Mild bibasilar atelectasis and or scarring. Calcified nodule left base consistent with calcified granuloma. Elevation right hemidiaphragm. No pleural effusion or pneumothorax. IMPRESSION: Right upper lobe infiltrate consistent with pneumonia. Electronically Signed   By: Marcello Moores  Register   On: 04/17/2019 13:17   Marzetta Board, MD, PhD [facility]  Between 7 am - 7 pm I am available, please contact me via Amion or Securechat  Between 7 pm - 7 am I am not available, please contact night coverage MD/APP via Amion

## 2019-04-19 ENCOUNTER — Inpatient Hospital Stay (HOSPITAL_COMMUNITY): Payer: Medicare Other

## 2019-04-19 DIAGNOSIS — M79609 Pain in unspecified limb: Secondary | ICD-10-CM

## 2019-04-19 DIAGNOSIS — U071 COVID-19: Secondary | ICD-10-CM

## 2019-04-19 LAB — CBC
HCT: 35.9 % — ABNORMAL LOW (ref 36.0–46.0)
Hemoglobin: 11.3 g/dL — ABNORMAL LOW (ref 12.0–15.0)
MCH: 28.1 pg (ref 26.0–34.0)
MCHC: 31.5 g/dL (ref 30.0–36.0)
MCV: 89.3 fL (ref 80.0–100.0)
Platelets: 399 10*3/uL (ref 150–400)
RBC: 4.02 MIL/uL (ref 3.87–5.11)
RDW: 14.8 % (ref 11.5–15.5)
WBC: 10.3 10*3/uL (ref 4.0–10.5)
nRBC: 0 % (ref 0.0–0.2)

## 2019-04-19 LAB — COMPREHENSIVE METABOLIC PANEL
ALT: 13 U/L (ref 0–44)
AST: 11 U/L — ABNORMAL LOW (ref 15–41)
Albumin: 3 g/dL — ABNORMAL LOW (ref 3.5–5.0)
Alkaline Phosphatase: 67 U/L (ref 38–126)
Anion gap: 10 (ref 5–15)
BUN: 26 mg/dL — ABNORMAL HIGH (ref 8–23)
CO2: 28 mmol/L (ref 22–32)
Calcium: 9.3 mg/dL (ref 8.9–10.3)
Chloride: 102 mmol/L (ref 98–111)
Creatinine, Ser: 0.74 mg/dL (ref 0.44–1.00)
GFR calc Af Amer: >60 mL/min (ref >60)
GFR calc non Af Amer: >60 mL/min (ref >60)
Glucose, Bld: 146 mg/dL — ABNORMAL HIGH (ref 70–99)
Potassium: 4.4 mmol/L (ref 3.5–5.1)
Sodium: 140 mmol/L (ref 135–145)
Total Bilirubin: 0.5 mg/dL (ref 0.3–1.2)
Total Protein: 6.3 g/dL — ABNORMAL LOW (ref 6.5–8.1)

## 2019-04-19 LAB — PROCALCITONIN: Procalcitonin: <0.1 ng/mL

## 2019-04-19 LAB — GLUCOSE, CAPILLARY
Glucose-Capillary: 163 mg/dL — ABNORMAL HIGH (ref 70–99)
Glucose-Capillary: 363 mg/dL — ABNORMAL HIGH (ref 70–99)
Glucose-Capillary: 183 mg/dL — ABNORMAL HIGH (ref 70–99)

## 2019-04-19 LAB — C-REACTIVE PROTEIN: CRP: 15.2 mg/dL — ABNORMAL HIGH (ref <1.0)

## 2019-04-19 LAB — D-DIMER, QUANTITATIVE: D-Dimer, Quant: 1.19 ug/mL-FEU — ABNORMAL HIGH (ref 0.00–0.50)

## 2019-04-19 NOTE — Progress Notes (Signed)
Spoke with patient's daughter [name] twice today, updated her on patient's condition and answered any questions she had. Facilitated phone call between patient and daughter [name].

## 2019-04-19 NOTE — Progress Notes (Signed)
VASCULAR LAB PRELIMINARY  PRELIMINARY  PRELIMINARY  PRELIMINARY  Bilateral lower extremity venous duplex completed.    Preliminary report:  See CV proc for preliminary results.   Erienne Spelman, RVT 04/19/2019, 2:43 PM

## 2019-04-19 NOTE — Progress Notes (Signed)
PROGRESS NOTE  Wendy Velasquez [id number] DOB: 30-Apr-1925 DOA: 04/17/2019 PCP: [name], MD   LOS: 2 days   Brief Narrative / Interim history: 86 y.o. female with medical history significant for prior complete heart block with cardiac arrest status post pacemaker placement, hypertension, hyperlipidemia, COPD, type 2 diabetes, GERD, COVID-19 viral infection on 04/04/2019 who presented with complaints of generalized weakness, decreased appetite, poor oral intake and productive cough. Went to see her PCP today due to presenting symptoms.  She was referred by her PCP due to hypoxia in the office.  O2 saturation dropped to the 80s on room air and she was dyspneic with ambulation. She was admitted to [facility].   Subjective / 24h Interval events: -Feeling well, no respiratory issues but does complain of right knee, right thigh and right hip pain.  She tells me that this is somewhat chronic and gets frequent steroid injections as an outpatient in his knee.  No cough or chest congestion.  Assessment & Plan:  Principal Problem Acute Hypoxic Respiratory Failure due to Covid-19 Viral Illness -Hypoxic in the PCPs office, however here she has been stable on room air and remains on room air today -Continue to monitor -Given respiratory symptoms at home with cough and shortness of breath, COVID-19 positive will start remdesivir.  No role for steroids given lack of hypoxia -Continue to monitor inflammatory markers, CRP increasing today we will keep a close watch   COVID-19 Labs  Recent Labs    04/18/19 0630 04/19/19 0333  DDIMER 1.47* 1.19*  CRP 8.0* 15.2*   No results found for: SARSCOV2NAA  Active Problems Concern for bacterial community-acquired pneumonia in the setting of recent COVID-19 viral infection -Chest x-ray on admission shows right upper lobe infiltrate suggesting lobar pneumonia -She was started on ceftriaxone, however she does have leukocytosis, is afebrile,  and procalcitonin is negative.  Bacterial pneumonia ruled out.  Stop antibiotics as this is most likely viral  Right knee, thigh, hip pain -There is a chronic component to it, however given thigh pain obtain lower extremity ultrasound to rule out a DVT  Generalized weakness in the setting of recent viral infection -PT/OT evaluated patient, recommended home health PT -She lives in independent living  Heart block status post pacemaker/QTC prolongation -Stable, avoid QT prolonging agents  Type 2 diabetes mellitus -Hemoglobin A1c 6.9, continue sliding scale  CBG (last 3)  Recent Labs    04/18/19 1127 04/18/19 1721 04/18/19 1948  GLUCAP 177* 213* 124*   History of COPD -Continue bronchodilators, no wheezing, stable  Chronic anxiety/depression -Continue home medications   Scheduled Meds: . vitamin C  500 mg Oral Daily  . aspirin EC  81 mg Oral Daily  . cholecalciferol  1,000 Units Oral Daily  . enoxaparin (LOVENOX) injection  40 mg Subcutaneous Q24H  . gabapentin  100 mg Oral QHS  . gabapentin  200 mg Oral BID WC  . insulin aspart  0-5 Units Subcutaneous QHS  . insulin aspart  0-9 Units Subcutaneous TID WC  . Ipratropium-Albuterol  1 puff Inhalation TID  . mirabegron ER  25 mg Oral Daily  . mometasone-formoterol  2 puff Inhalation BID  . pantoprazole  40 mg Oral Daily  . sertraline  50 mg Oral BID  . vitamin B-12  1,000 mcg Oral Daily  . zinc sulfate  220 mg Oral Daily   Continuous Infusions: . remdesivir 100 mg in NS 100 mL 100 mg (04/19/19 1032)   PRN Meds:.acetaminophen  DVT  prophylaxis: Lovenox Code Status: Full code per patient, confirmed this again with her Family Communication: d/w daughter Pershing Cox [phone] Patient admitted from: ILF Anticipated d/c place: TBD Barriers to d/c: Intravenous remdesivir  Consultants:  None  Procedures:  None   Microbiology: None   Antibacterials: None    Objective: Vitals:   04/18/19 2100 04/19/19 0030  04/19/19 0534 04/19/19 0738  BP: 124/66 133/68 120/62 (!) 123/56  Pulse: 87 88 87 93  Resp: 18 (!) 24 (!) 24 13  Temp: 97.8 F (36.6 C) 98.4 F (36.9 C) 98.2 F (36.8 C) 98.4 F (36.9 C)  TempSrc: Oral Oral Oral Oral  SpO2: 96% 91% 95% 96%  Weight:      Height:        Intake/Output Summary (Last 24 hours) at 04/19/2019 1042 Last data filed at 04/18/2019 2040 Gross per 24 hour  Intake --  Output 750 ml  Net -750 ml   Filed Weights   04/17/19 1230  Weight: 54.8 kg    Examination:  Constitutional: NAD Eyes: No icterus ENMT: Moist mucous membranes Neck: normal, supple Respiratory: Clear, no wheezing, no crackles, normal respiratory effort Cardiovascular: Regular rate and rhythm, no murmurs, no edema Abdomen: Soft, bowel sounds positive Musculoskeletal: no clubbing / cyanosis.  Skin: No rashes Neurologic: Nonfocal, equal strength Psychiatric: Normal judgment and insight.     Data Reviewed: I have independently reviewed following labs and imaging studies   CBC: Recent Labs  Lab 04/17/19 1321 04/18/19 0630 04/19/19 0333  WBC 9.9 9.4 10.3  NEUTROABS 7.3  --   --   HGB 12.0 11.5* 11.3*  HCT 36.6 36.0 35.9*  MCV 87.6 88.5 89.3  PLT 373 358 [id number]   Basic Metabolic Panel: Recent Labs  Lab 04/17/19 1321 04/18/19 0630 04/19/19 0333  NA 137 137 140  K 4.5 4.2 4.4  CL 102 99 102  CO2 25 22 28   GLUCOSE 126* 144* 146*  BUN 31* 20 26*  CREATININE 0.90 0.73 0.74  CALCIUM 9.1 8.3* 9.3   GFR: Estimated Creatinine Clearance: 39.6 mL/min (by C-G formula based on SCr of 0.74 mg/dL). Liver Function Tests: Recent Labs  Lab 04/17/19 1321 04/18/19 0630 04/19/19 0333  AST 13* 14* 11*  ALT 13 13 13   ALKPHOS 66 63 67  BILITOT 0.6 0.5 0.5  PROT 6.7 6.1* 6.3*  ALBUMIN 3.1* 2.9* 3.0*   Recent Labs  Lab 04/17/19 1321  LIPASE 18   No results for input(s): AMMONIA in the last 168 hours. Coagulation Profile: No results for input(s): INR, PROTIME in the last 168  hours. Cardiac Enzymes: No results for input(s): CKTOTAL, CKMB, CKMBINDEX, TROPONINI in the last 168 hours. BNP (last 3 results) No results for input(s): PROBNP in the last 8760 hours. HbA1C: Recent Labs    04/18/19 0630  HGBA1C 6.9*   CBG: Recent Labs  Lab 04/17/19 2247 04/18/19 0748 04/18/19 1127 04/18/19 1721 04/18/19 1948  GLUCAP 144* 141* 177* 213* 124*   Lipid Profile: No results for input(s): CHOL, HDL, LDLCALC, TRIG, CHOLHDL, LDLDIRECT in the last 72 hours. Thyroid Function Tests: Recent Labs    04/17/19 1321  TSH 1.767   Anemia Panel: No results for input(s): VITAMINB12, FOLATE, FERRITIN, TIBC, IRON, RETICCTPCT in the last 72 hours. Urine analysis:    Component Value Date/Time   COLORURINE YELLOW 04/17/2019 [place] 04/17/2019 1643   LABSPEC 1.020 04/17/2019 1643   PHURINE 5.0 04/17/2019 1643   GLUCOSEU NEGATIVE 04/17/2019 1643   HGBUR  NEGATIVE 04/17/2019 [place] 04/17/2019 [place] 04/17/2019 1643   PROTEINUR NEGATIVE 04/17/2019 1643   UROBILINOGEN 0.2 09/11/2012 0927   NITRITE NEGATIVE 04/17/2019 1643   LEUKOCYTESUR NEGATIVE 04/17/2019 1643   Sepsis Labs: Invalid input(s): PROCALCITONIN, LACTICIDVEN  Recent Results (from the past 240 hour(s))  Urine culture     Status: None   Collection Time: 04/17/19  4:43 PM   Specimen: Urine, Random  Result Value Ref Range Status   Specimen Description   Final    URINE, RANDOM Performed at [facility] [address] [place], [place] [address]    Special Requests   Final    NONE Performed at [facility], [place] [address] [place], [place] [address]    Culture   Final    NO GROWTH Performed at [facility], [place] [address], [place], [place] [address]    Report Status 04/18/2019 FINAL  Final  Blood culture (routine x 2)     Status: None (Preliminary result)   Collection Time: 04/17/19  5:01 PM   Specimen:  BLOOD  Result Value Ref Range Status   Specimen Description BLOOD RIGHT ANTECUBITAL  Final   Special Requests   Final    BOTTLES DRAWN AEROBIC AND ANAEROBIC Blood Culture results may not be optimal due to an excessive volume of blood received in culture bottles Performed at [facility], [place] [address], [place], [place] [address]    Culture   Final    NO GROWTH 2 DAYS Performed at [facility] [address] [place], [place] [address]    Report Status PENDING  Incomplete  MRSA PCR Screening     Status: None   Collection Time: 04/18/19  1:23 AM   Specimen: Nasopharyngeal  Result Value Ref Range Status   MRSA by PCR NEGATIVE NEGATIVE Final    Comment:        The GeneXpert MRSA Assay (FDA approved for NASAL specimens only), is one component of a comprehensive MRSA colonization surveillance program. It is not intended to diagnose MRSA infection nor to guide or monitor treatment for MRSA infections. Performed at [facility], [place] [address] [place], [place] [address]       Radiology Studies: DG Chest Portable 1 View  Result Date: 04/17/2019 CLINICAL DATA:  COVID positive.  Cough. EXAM: PORTABLE CHEST 1 VIEW COMPARISON:  02/24/2015. FINDINGS: Mediastinum and hilar structures normal. Cardiac pacer with lead tip over the right atrium and right ventricle. Sliding hiatal hernia. Right upper lobe infiltrate noted today's exam. Mild bibasilar atelectasis and or scarring. Calcified nodule left base consistent with calcified granuloma. Elevation right hemidiaphragm. No pleural effusion or pneumothorax. IMPRESSION: Right upper lobe infiltrate consistent with pneumonia. Electronically Signed   By: Marcello Moores  Register   On: 04/17/2019 13:17   Marzetta Board, MD, PhD [facility]  Between 7 am - 7 pm I am available, please contact me via Amion or Securechat  Between 7 pm - 7 am I am not available, please contact night coverage MD/APP via  Amion

## 2019-04-20 LAB — D-DIMER, QUANTITATIVE: D-Dimer, Quant: 1.18 ug/mL-FEU — ABNORMAL HIGH (ref 0.00–0.50)

## 2019-04-20 LAB — CBC
HCT: 32.8 % — ABNORMAL LOW (ref 36.0–46.0)
Hemoglobin: 10.5 g/dL — ABNORMAL LOW (ref 12.0–15.0)
MCH: 28.3 pg (ref 26.0–34.0)
MCHC: 32 g/dL (ref 30.0–36.0)
MCV: 88.4 fL (ref 80.0–100.0)
Platelets: 335 10*3/uL (ref 150–400)
RBC: 3.71 MIL/uL — ABNORMAL LOW (ref 3.87–5.11)
RDW: 14.6 % (ref 11.5–15.5)
WBC: 10.8 10*3/uL — ABNORMAL HIGH (ref 4.0–10.5)
nRBC: 0 % (ref 0.0–0.2)

## 2019-04-20 LAB — COMPREHENSIVE METABOLIC PANEL
ALT: 12 U/L (ref 0–44)
AST: 12 U/L — ABNORMAL LOW (ref 15–41)
Albumin: 2.6 g/dL — ABNORMAL LOW (ref 3.5–5.0)
Alkaline Phosphatase: 63 U/L (ref 38–126)
Anion gap: 9 (ref 5–15)
BUN: 25 mg/dL — ABNORMAL HIGH (ref 8–23)
CO2: 27 mmol/L (ref 22–32)
Calcium: 8.9 mg/dL (ref 8.9–10.3)
Chloride: 100 mmol/L (ref 98–111)
Creatinine, Ser: 0.67 mg/dL (ref 0.44–1.00)
GFR calc Af Amer: >60 mL/min (ref >60)
GFR calc non Af Amer: >60 mL/min (ref >60)
Glucose, Bld: 199 mg/dL — ABNORMAL HIGH (ref 70–99)
Potassium: 4.1 mmol/L (ref 3.5–5.1)
Sodium: 136 mmol/L (ref 135–145)
Total Bilirubin: 0.5 mg/dL (ref 0.3–1.2)
Total Protein: 5.6 g/dL — ABNORMAL LOW (ref 6.5–8.1)

## 2019-04-20 LAB — GLUCOSE, CAPILLARY
Glucose-Capillary: 217 mg/dL — ABNORMAL HIGH (ref 70–99)
Glucose-Capillary: 391 mg/dL — ABNORMAL HIGH (ref 70–99)
Glucose-Capillary: 238 mg/dL — ABNORMAL HIGH (ref 70–99)
Glucose-Capillary: 243 mg/dL — ABNORMAL HIGH (ref 70–99)

## 2019-04-20 LAB — C-REACTIVE PROTEIN: CRP: 23.7 mg/dL — ABNORMAL HIGH (ref <1.0)

## 2019-04-20 LAB — PROCALCITONIN: Procalcitonin: 0.97 ng/mL

## 2019-04-20 MED ORDER — INSULIN ASPART 100 UNIT/ML ~~LOC~~ SOLN
3.0000 [IU] | Freq: Three times a day (TID) | SUBCUTANEOUS | Status: DC
  Administered 2019-04-20 – 2019-04-23 (×9): 3 [IU] via SUBCUTANEOUS

## 2019-04-20 NOTE — Progress Notes (Addendum)
Physical Therapy Treatment Patient Details Name: Wendy Velasquez MRN: [id number] DOB: 1926/02/09 Today's Date: 04/20/2019    History of Present Illness Pt is a 86 y.o. female dx with COVID-19 on 04/04/19, now admitted from [facility] on 04/17/19 with generalized weakness, poor oral intake and productive cough. Worked up for acute hypoxic respiratory failure secondary to COVID-19. PMH includes pacemaker, HTN, COPD, DM2.   PT Comments    Pt slowly progressing with mobility, limited by significant R knee pain with PROM/AROM and weight bearing. Pt requiring min-modA to stand to RW and take a few steps. Pt with urine incontinence requiring assist for standing ADL tasks. When asked about managing at home if RLE pain persists, pt reports this has happened before due to chronic R knee issues; she has w/c, family able to provide 24/7 assist as needed. Pt motivated to return home. SpO2 97% on RA. Pt c/o dizziness upon sitting which subsided with rest (see values below).  Orthostatic BPs Sitting 93/51  Post-transfer to recliner 98/55  End of session sitting 109/48     Follow Up Recommendations  Home health PT;Supervision/Assistance - 24 hour     Equipment Recommendations  Rolling walker with 5" wheels    Recommendations for Other Services       Precautions / Restrictions Precautions Precautions: Fall Precaution Comments: R knee pain for years, causes difficulty walking; urine incontinence Restrictions Weight Bearing Restrictions: No    Mobility  Bed Mobility Overal bed mobility: Modified Independent             General bed mobility comments: Increased time and effort secondary to R knee pain, no physical assist required  Transfers Overall transfer level: Needs assistance Equipment used: Rolling walker (2 wheeled) Transfers: Sit to/from Stand Sit to Stand: Min assist;Mod assist         General transfer comment: Initial minA for trunk elevation and stability standing  to RW from EOB; 2x trials from recliner, pt requiring modA due to significant R knee pain with difficulty WB  Ambulation/Gait Ambulation/Gait assistance: Min assist;Mod assist Gait Distance (Feet): 2 Feet Assistive device: Rolling walker (2 wheeled) Gait Pattern/deviations: Step-to pattern;Decreased weight shift to right;Antalgic;Trunk flexed   Gait velocity interpretation: <1.31 ft/sec, indicative of household ambulator General Gait Details: Slow, antalgic steps from bed to recliner with RW and consistent minA to maintain balance, pt limited by pain and difficulty WB thru RLE; min-modA to maneuver RW; cues for sequencing   Stairs             Wheelchair Mobility    Modified Rankin (Stroke Patients Only)       Balance Overall balance assessment: Needs assistance   Sitting balance-Leahy Scale: Fair Sitting balance - Comments: Can maintain prolonged static sitting without assist; able to perform pericare while seated at edge of recliner   Standing balance support: Bilateral upper extremity supported;During functional activity Standing balance-Leahy Scale: Poor Standing balance comment: Heavy reliance on BUE support to offload painful RLE, also requiring external assist                            Cognition Arousal/Alertness: Awake/alert Behavior During Therapy: WFL for tasks assessed/performed Overall Cognitive Status: No family/caregiver present to determine baseline cognitive functioning                                 General Comments: WFL for simple  tasks, not formally assessed; required cues and repetition for multistep commands, pt internally distracted by pain      Exercises      General Comments General comments (skin integrity, edema, etc.): Pt unable to tolerate gentle R knee PROM beyond repositioning to encourage elevation and knee extension. Pt reports this amount of pain has happened before; will have necessary assist from daughters  and husband, also has w/c      Pertinent Vitals/Pain Pain Assessment: Faces Faces Pain Scale: Hurts even more Pain Location: R knee with any PROM/AROM and WB Pain Descriptors / Indicators: Grimacing;Guarding;Moaning Pain Intervention(s): Monitored during session;Repositioned    Home Living                      Prior Function            PT Goals (current goals can now be found in the care plan section) Progress towards PT goals: Progressing toward goals(slowly, limited by pain)    Frequency    Min 3X/week      PT Plan Current plan remains appropriate    Co-evaluation              AM-PAC PT "6 Clicks" Mobility   Outcome Measure  Help needed turning from your back to your side while in a flat bed without using bedrails?: None Help needed moving from lying on your back to sitting on the side of a flat bed without using bedrails?: A Little Help needed moving to and from a bed to a chair (including a wheelchair)?: A Little Help needed standing up from a chair using your arms (e.g., wheelchair or bedside chair)?: A Lot Help needed to walk in hospital room?: A Lot Help needed climbing 3-5 steps with a railing? : A Lot 6 Click Score: 16    End of Session   Activity Tolerance: Patient limited by pain Patient left: in chair;with call bell/phone within reach;with chair alarm set Nurse Communication: Mobility status PT Visit Diagnosis: Unsteadiness on feet (R26.81);Other abnormalities of gait and mobility (R26.89);Muscle weakness (generalized) (M62.81);Pain Pain - Right/Left: Right Pain - part of body: Knee     Time: 1110-1149 PT Time Calculation (min) (ACUTE ONLY): 39 min  Charges:  $Therapeutic Activity: 38-52 mins                    Mabeline Caras, PT, DPT Acute Rehabilitation Services  Pager [phone] Office [place] 04/20/2019, 1:01 PM

## 2019-04-20 NOTE — Progress Notes (Signed)
Inpatient Diabetes Program Recommendations  AACE/ADA: New Consensus Statement on Inpatient Glycemic Control (2015)  Target Ranges:  Prepandial:   less than 140 mg/dL      Peak postprandial:   less than 180 mg/dL (1-2 hours)      Critically ill patients:  140 - 180 mg/dL   Results for Wendy Velasquez, Wendy Velasquez (MRN [id number]) as of 04/20/2019 14:20  Ref. Range 04/19/2019 07:53 04/19/2019 12:11 04/19/2019 15:46  Glucose-Capillary Latest Ref Range: 70 - 99 mg/dL 163 (H)  2 units NOVOLOG  363 (H)  9 units NOVOLOG  183 (H)  2 units NOVOLOG    Results for Wendy Velasquez, Wendy Velasquez (MRN [id number]) as of 04/20/2019 14:20  Ref. Range 04/20/2019 07:45 04/20/2019 12:50  Glucose-Capillary Latest Ref Range: 70 - 99 mg/dL 217 (H)  3 units NOVOLOG  391 (H)  9 units NOVOLOG     Admit COVID+ (diagnosed 04/04/2019)/ SOB  History: DM2, Complete heart block with cardiac arrest status post pacemaker placement, COPD   Home DM Meds: Lantus 12 units QHS        Metformin 1000 mg BID   Current Orders: Novolog Sensitive Correction Scale/ SSI (0-9 units) TID AC + HS     MD- Please consider the following in-hospital insulin adjustments:  1. Start Lantus 8 units QHS (70% home dose to start)  2. Start Novolog Meal Coverage: Novolog 4 units TID with meals  (Please add the following Hold Parameters: Hold if pt eats <50% of meal, Hold if pt NPO)     --Will follow patient during hospitalization--  Wyn Quaker RN, MSN, CDE Diabetes Coordinator Inpatient Glycemic Control Team Team Pager: [phone] (8a-5p)

## 2019-04-20 NOTE — Progress Notes (Signed)
PROGRESS NOTE  Wendy Velasquez [id number] DOB: 01-Sep-1925 DOA: 04/17/2019 PCP: [name], MD   LOS: 3 days   Brief Narrative / Interim history: 86 y.o. female with medical history significant for prior complete heart block with cardiac arrest status post pacemaker placement, hypertension, hyperlipidemia, COPD, type 2 diabetes, GERD, COVID-19 viral infection on 04/04/2019 who presented with complaints of generalized weakness, decreased appetite, poor oral intake and productive cough. Went to see her PCP today due to presenting symptoms.  She was referred by her PCP due to hypoxia in the office.  O2 saturation dropped to the 80s on room air and she was dyspneic with ambulation. She was admitted to [facility]   Subjective / 24h Interval events: -no breathing issues, still complains of right knee pain.  Complains of being significantly weak  Assessment & Plan:  Principal Problem Acute Hypoxic Respiratory Failure due to Covid-19 Viral Illness -Hypoxic in the PCPs office, however here she has been stable on room air and remains on room air today -Continue to monitor -Given respiratory symptoms at home with cough and shortness of breath, COVID-19 positive will start remdesivir.  No role for steroids given lack of hypoxia -Continue to monitor inflammatory markers, CRP significantly increasing but clinically she appears to be stable.  Will not discharge until it peaks.  She is scheduled to finish remdesivir on 2/10   COVID-19 Labs  Recent Labs    04/18/19 0630 04/19/19 0333 04/20/19 0331  DDIMER 1.47* 1.19* 1.18*  CRP 8.0* 15.2* 23.7*   No results found for: SARSCOV2NAA  Active Problems Concern for bacterial community-acquired pneumonia in the setting of recent COVID-19 viral infection -Chest x-ray on admission shows right upper lobe infiltrate suggesting lobar pneumonia -She was started on ceftriaxone, however she does not have leukocytosis, is afebrile, and  procalcitonin is negative.  Bacterial pneumonia ruled out.  Stop antibiotics as this is most likely viral  Right knee, thigh, hip pain -There is a chronic component to it, however given thigh pain obtained lower extremity ultrasound to rule out a DVT, and this was negative  Generalized weakness in the setting of recent viral infection -PT/OT evaluated patient, recommended home health PT with 24-hour supervision.   -She lives in independent living  Heart block status post pacemaker/QTC prolongation -Stable, avoid QT prolonging agents  Type 2 diabetes mellitus -Hemoglobin A1c 6.9, continue sliding scale  CBG (last 3)  Recent Labs    04/19/19 1211 04/19/19 1546 04/20/19 0745  GLUCAP 363* 183* 217*   History of COPD -Continue bronchodilators, no wheezing, stable  Chronic anxiety/depression -Continue home medications   Scheduled Meds: . vitamin C  500 mg Oral Daily  . aspirin EC  81 mg Oral Daily  . cholecalciferol  1,000 Units Oral Daily  . enoxaparin (LOVENOX) injection  40 mg Subcutaneous Q24H  . gabapentin  100 mg Oral QHS  . gabapentin  200 mg Oral BID WC  . insulin aspart  0-5 Units Subcutaneous QHS  . insulin aspart  0-9 Units Subcutaneous TID WC  . Ipratropium-Albuterol  1 puff Inhalation TID  . mirabegron ER  25 mg Oral Daily  . mometasone-formoterol  2 puff Inhalation BID  . pantoprazole  40 mg Oral Daily  . sertraline  50 mg Oral BID  . vitamin B-12  1,000 mcg Oral Daily  . zinc sulfate  220 mg Oral Daily   Continuous Infusions: . remdesivir 100 mg in NS 100 mL 100 mg (04/20/19 1007)   PRN  Meds:.acetaminophen  DVT prophylaxis: Lovenox Code Status: Full code per patient, confirmed this again with her Family Communication: d/w daughter Pershing Cox [phone] Patient admitted from: [facility] Anticipated d/c place: TBD Barriers to d/c: Intravenous remdesivir  Consultants:  None  Procedures:  None   Microbiology: None   Antibacterials: None     Objective: Vitals:   04/19/19 2100 04/20/19 0100 04/20/19 0740 04/20/19 1230  BP: 116/81 124/77 (!) 123/53 132/61  Pulse: 88 80 84 89  Resp: (!) 22 (!) 24 14 (!) 23  Temp: 98.2 F (36.8 C) 98 F (36.7 C)    TempSrc: Oral Oral    SpO2: 95%  97% 92%  Weight:      Height:        Intake/Output Summary (Last 24 hours) at 04/20/2019 1306 Last data filed at 04/20/2019 1007 Gross per 24 hour  Intake --  Output 1510 ml  Net -1510 ml   Filed Weights   04/17/19 1230  Weight: 54.8 kg    Examination:  Constitutional: No distress, eating breakfast Eyes: No scleral icterus ENMT: Moist mucous membranes Neck: normal, supple Respiratory: Clear, no wheezing, no crackles, shallow respirations Cardiovascular: Regular rate and rhythm, no murmurs.  No edema Abdomen: Soft, bowel sounds positive Musculoskeletal: no clubbing / cyanosis.  Skin: No rashes seen Neurologic: No focal deficits, equal strength Psychiatric: Normal judgment and insight.     Data Reviewed: I have independently reviewed following labs and imaging studies   CBC: Recent Labs  Lab 04/17/19 1321 04/18/19 0630 04/19/19 0333 04/20/19 0331  WBC 9.9 9.4 10.3 10.8*  NEUTROABS 7.3  --   --   --   HGB 12.0 11.5* 11.3* 10.5*  HCT 36.6 36.0 35.9* 32.8*  MCV 87.6 88.5 89.3 88.4  PLT 373 358 399 [id number]   Basic Metabolic Panel: Recent Labs  Lab 04/17/19 1321 04/18/19 0630 04/19/19 0333 04/20/19 0331  NA 137 137 140 136  K 4.5 4.2 4.4 4.1  CL 102 99 102 100  CO2 25 22 28 27   GLUCOSE 126* 144* 146* 199*  BUN 31* 20 26* 25*  CREATININE 0.90 0.73 0.74 0.67  CALCIUM 9.1 8.3* 9.3 8.9   GFR: Estimated Creatinine Clearance: 39.6 mL/min (by C-G formula based on SCr of 0.67 mg/dL). Liver Function Tests: Recent Labs  Lab 04/17/19 1321 04/18/19 0630 04/19/19 0333 04/20/19 0331  AST 13* 14* 11* 12*  ALT 13 13 13 12   ALKPHOS 66 63 67 63  BILITOT 0.6 0.5 0.5 0.5  PROT 6.7 6.1* 6.3* 5.6*  ALBUMIN 3.1* 2.9* 3.0* 2.6*    Recent Labs  Lab 04/17/19 1321  LIPASE 18   No results for input(s): AMMONIA in the last 168 hours. Coagulation Profile: No results for input(s): INR, PROTIME in the last 168 hours. Cardiac Enzymes: No results for input(s): CKTOTAL, CKMB, CKMBINDEX, TROPONINI in the last 168 hours. BNP (last 3 results) No results for input(s): PROBNP in the last 8760 hours. HbA1C: Recent Labs    04/18/19 0630  HGBA1C 6.9*   CBG: Recent Labs  Lab 04/18/19 1948 04/19/19 0753 04/19/19 1211 04/19/19 1546 04/20/19 0745  GLUCAP 124* 163* 363* 183* 217*   Lipid Profile: No results for input(s): CHOL, HDL, LDLCALC, TRIG, CHOLHDL, LDLDIRECT in the last 72 hours. Thyroid Function Tests: Recent Labs    04/17/19 1321  TSH 1.767   Anemia Panel: No results for input(s): VITAMINB12, FOLATE, FERRITIN, TIBC, IRON, RETICCTPCT in the last 72 hours. Urine analysis:    Component Value Date/Time  COLORURINE YELLOW 04/17/2019 [place] 04/17/2019 1643   LABSPEC 1.020 04/17/2019 1643   PHURINE 5.0 04/17/2019 1643   GLUCOSEU NEGATIVE 04/17/2019 [place] 04/17/2019 [place] 04/17/2019 [place] 04/17/2019 1643   PROTEINUR NEGATIVE 04/17/2019 1643   UROBILINOGEN 0.2 09/11/2012 0927   NITRITE NEGATIVE 04/17/2019 1643   LEUKOCYTESUR NEGATIVE 04/17/2019 1643   Sepsis Labs: Invalid input(s): PROCALCITONIN, LACTICIDVEN  Recent Results (from the past 240 hour(s))  Urine culture     Status: None   Collection Time: 04/17/19  4:43 PM   Specimen: Urine, Random  Result Value Ref Range Status   Specimen Description   Final    URINE, RANDOM Performed at [facility] [address], [place], [place] [address]    Special Requests   Final    NONE Performed at [facility], [place] [address] [place], [place] [address]    Culture   Final    NO GROWTH Performed at [facility], [place] [address] [place], [place] [address]    Report Status 04/18/2019 FINAL  Final  Blood culture (routine x 2)     Status: None (Preliminary result)   Collection Time: 04/17/19  5:01 PM   Specimen: BLOOD  Result Value Ref Range Status   Specimen Description BLOOD RIGHT ANTECUBITAL  Final   Special Requests   Final    BOTTLES DRAWN AEROBIC AND ANAEROBIC Blood Culture results may not be optimal due to an excessive volume of blood received in culture bottles Performed at [facility], [place] [address], [place], [place] [address]    Culture   Final    NO GROWTH 2 DAYS Performed at [facility] [address], [place], [place] [address]    Report Status PENDING  Incomplete  MRSA PCR Screening     Status: None   Collection Time: 04/18/19  1:23 AM   Specimen: Nasopharyngeal  Result Value Ref Range Status   MRSA by PCR NEGATIVE NEGATIVE Final    Comment:        The GeneXpert MRSA Assay (FDA approved for NASAL specimens only), is one component of a comprehensive MRSA colonization surveillance program. It is not intended to diagnose MRSA infection nor to guide or monitor treatment for MRSA infections. Performed at [facility], [place] [address] [place], [place] [address]       Radiology Studies: VAS [place] LOWER EXTREMITY VENOUS (DVT)  Result Date: 04/20/2019  Lower Venous DVT Study Indications: Chronic thigh pain, Covid-19.  Comparison Study: No prior study on file for comparison Performing Technologist: Sharion Dove RVS  Examination Guidelines: A complete evaluation includes B-mode imaging, spectral Doppler, color Doppler, and power Doppler as needed of all accessible portions of each vessel. Bilateral testing is considered an integral part of a complete examination. Limited examinations for reoccurring indications may be performed as noted. The reflux portion of the exam is performed with the patient in reverse Trendelenburg.   +---------+---------------+---------+-----------+----------+--------------+ RIGHT    CompressibilityPhasicitySpontaneityPropertiesThrombus Aging +---------+---------------+---------+-----------+----------+--------------+ CFV      Full           Yes      Yes                                 +---------+---------------+---------+-----------+----------+--------------+ SFJ      Full                                                        +---------+---------------+---------+-----------+----------+--------------+  FV Prox  Full                                                        +---------+---------------+---------+-----------+----------+--------------+ FV Mid   Full                                                        +---------+---------------+---------+-----------+----------+--------------+ FV DistalFull                                                        +---------+---------------+---------+-----------+----------+--------------+ PFV      Full                                                        +---------+---------------+---------+-----------+----------+--------------+ POP      Full           Yes      Yes                                 +---------+---------------+---------+-----------+----------+--------------+ PTV      Full                                                        +---------+---------------+---------+-----------+----------+--------------+ PERO     Full                                                        +---------+---------------+---------+-----------+----------+--------------+ Gastroc  Full                                                        +---------+---------------+---------+-----------+----------+--------------+   +---------+---------------+---------+-----------+----------+-------------------+ LEFT     CompressibilityPhasicitySpontaneityPropertiesThrombus Aging       +---------+---------------+---------+-----------+----------+-------------------+ CFV      Full           Yes      Yes                                      +---------+---------------+---------+-----------+----------+-------------------+ SFJ      Full                                                             +---------+---------------+---------+-----------+----------+-------------------+  FV Prox  Full                                                             +---------+---------------+---------+-----------+----------+-------------------+ FV Mid   Full                                                             +---------+---------------+---------+-----------+----------+-------------------+ FV DistalFull                                                             +---------+---------------+---------+-----------+----------+-------------------+ PFV      Full                                                             +---------+---------------+---------+-----------+----------+-------------------+ POP                     Yes      Yes                  patent by color and                                                       Doppler             +---------+---------------+---------+-----------+----------+-------------------+ PTV      Full                                                             +---------+---------------+---------+-----------+----------+-------------------+ PERO     Full                                                             +---------+---------------+---------+-----------+----------+-------------------+     Summary: BILATERAL: - No evidence of deep vein thrombosis seen in the lower extremities, bilaterally.   *See table(s) above for measurements and observations. Electronically signed by Monica Martinez MD on 04/20/2019 at 9:23:49 AM.    Final    Marzetta Board, MD, PhD [facility]  Between 7 am - 7 pm I am  available, please contact me via [place] or [place]  Between 7 pm - 7 am I am not available, please contact night coverage MD/APP  via [employer]

## 2019-04-20 NOTE — Progress Notes (Signed)
Spoke with patient's [name] today, updated her on patient's condition and answered any questions she had. Facilitated phone call between patient and [name]

## 2019-04-21 ENCOUNTER — Inpatient Hospital Stay (HOSPITAL_COMMUNITY): Payer: Medicare Other

## 2019-04-21 LAB — COMPREHENSIVE METABOLIC PANEL
ALT: 12 U/L (ref 0–44)
AST: 9 U/L — ABNORMAL LOW (ref 15–41)
Albumin: 2.5 g/dL — ABNORMAL LOW (ref 3.5–5.0)
Alkaline Phosphatase: 66 U/L (ref 38–126)
Anion gap: 9 (ref 5–15)
BUN: 29 mg/dL — ABNORMAL HIGH (ref 8–23)
CO2: 27 mmol/L (ref 22–32)
Calcium: 9.1 mg/dL (ref 8.9–10.3)
Chloride: 100 mmol/L (ref 98–111)
Creatinine, Ser: 0.65 mg/dL (ref 0.44–1.00)
GFR calc Af Amer: >60 mL/min (ref >60)
GFR calc non Af Amer: >60 mL/min (ref >60)
Glucose, Bld: 250 mg/dL — ABNORMAL HIGH (ref 70–99)
Potassium: 4.1 mmol/L (ref 3.5–5.1)
Sodium: 136 mmol/L (ref 135–145)
Total Bilirubin: 0.5 mg/dL (ref 0.3–1.2)
Total Protein: 5.7 g/dL — ABNORMAL LOW (ref 6.5–8.1)

## 2019-04-21 LAB — C-REACTIVE PROTEIN: CRP: 27.4 mg/dL — ABNORMAL HIGH (ref <1.0)

## 2019-04-21 LAB — CBC
HCT: 31.7 % — ABNORMAL LOW (ref 36.0–46.0)
Hemoglobin: 10.1 g/dL — ABNORMAL LOW (ref 12.0–15.0)
MCH: 28.1 pg (ref 26.0–34.0)
MCHC: 31.9 g/dL (ref 30.0–36.0)
MCV: 88.1 fL (ref 80.0–100.0)
Platelets: 357 10*3/uL (ref 150–400)
RBC: 3.6 MIL/uL — ABNORMAL LOW (ref 3.87–5.11)
RDW: 14.5 % (ref 11.5–15.5)
WBC: 10.2 10*3/uL (ref 4.0–10.5)
nRBC: 0 % (ref 0.0–0.2)

## 2019-04-21 LAB — GLUCOSE, CAPILLARY
Glucose-Capillary: 255 mg/dL — ABNORMAL HIGH (ref 70–99)
Glucose-Capillary: 249 mg/dL — ABNORMAL HIGH (ref 70–99)
Glucose-Capillary: 279 mg/dL — ABNORMAL HIGH (ref 70–99)

## 2019-04-21 LAB — D-DIMER, QUANTITATIVE: D-Dimer, Quant: 1.19 ug/mL-FEU — ABNORMAL HIGH (ref 0.00–0.50)

## 2019-04-21 MED ORDER — INSULIN GLARGINE 100 UNIT/ML ~~LOC~~ SOLN
8.0000 [IU] | Freq: Every day | SUBCUTANEOUS | Status: DC
  Administered 2019-04-21 – 2019-04-23 (×3): 8 [IU] via SUBCUTANEOUS
  Filled 2019-04-21 (×3): qty 0.08

## 2019-04-21 MED ORDER — LIDOCAINE 5 % EX PTCH
1.0000 | MEDICATED_PATCH | CUTANEOUS | Status: DC
  Administered 2019-04-21 – 2019-04-22 (×2): 1 via TRANSDERMAL
  Filled 2019-04-21 (×3): qty 1

## 2019-04-21 NOTE — Progress Notes (Signed)
Physical Therapy Treatment Patient Details Name: Wendy Velasquez MRN: [id number] DOB: October 19, 1925 Today's Date: 04/21/2019    History of Present Illness Pt is a 86 y.o. female dx with COVID-19 on 04/04/19, now admitted from [place] on 04/17/19 with generalized weakness, poor oral intake and productive cough. Worked up for acute hypoxic respiratory failure secondary to COVID-19. PMH includes pacemaker, HTN, COPD, DM2.    PT Comments    Patient was in [facility] chair when PT arrived. Several blankets in place. Legs elevated. She was intermittently sleepy and complained of RIGHT knee pain. States that she was supposed to consider a total knee replacement years ago, but waited too late and physician told her that. Her spouse has had knee replacement with "good results". She agreed to perform ex in chair- did not want to attempt any sit<>stand. Practiced repositioning in her chair to sit more directly upright. Discussed the importance of sitting up and staying out of bed. Practiced LE ex , AA, A with cues- limited by RIGHT knee pain. Practiced pursed lip breathing, and IS- stated she had never seen that unit before and did not know how to use it (it was sitting on her bedside table). Reviewed proper use with good return demo. Should benefit from PT to further address goals for optimal functional outcomes. At end of session, she remained in chair, BS table over her lap, covers in place, legs elevated, remote-call light and telephone all in reach.   Follow Up Recommendations  Home health PT;Supervision/Assistance - 24 hour     Equipment Recommendations  Rolling walker with 5" wheels    Recommendations for Other Services       Precautions / Restrictions Precautions Precautions: Fall Precaution Comments: R knee pain for years, causes difficulty walking; urine incontinence-- She states she gets injections in RIGHT knee every 3 months, and periodically has to have it "drained" Restrictions Weight  Bearing Restrictions: No    Mobility  Bed Mobility Overal bed mobility: (Did not practice bed mobility, transfers or gait today- she was in [facility] chair when PT arrived- but declined to want to attempt any sit<>stands with RW due to right knee discomfort- sometimes feels like it "will buckle")                Transfers                    Ambulation/Gait                 Stairs             Wheelchair Mobility    Modified Rankin (Stroke Patients Only)       Balance                                            Cognition Arousal/Alertness: Awake/alert Behavior During Therapy: WFL for tasks assessed/performed                                   General Comments: Agreed to ex format. Stated she was tired and sleepy, that her knee was also bothering her.      Exercises General Exercises - Lower Extremity Ankle Circles/Pumps: AROM;Seated;10 reps Quad Sets: AROM;Seated;10 reps Gluteal Sets: AROM;Seated;10 reps Hip ABduction/ADduction: AROM;AAROM;Seated;Both;10 reps Straight Leg Raises: AROM;AAROM;Seated;Both;10 reps Other Exercises  Other Exercises: Practiced IS - she stated she did not know what it was for and had not seen it before. Cues for proper use and able to pull 750 for 6 out of 10 reps Other Exercises: TA- repositioning in chair for more optimal postural awareness. Explained the importance of sitting up in chair/benefits etc.    General Comments        Pertinent Vitals/Pain Pain Score: 4  Pain Location: R knee with any PROM/AROM and WB Pain Descriptors / Indicators: Grimacing;Guarding;Moaning    Home Living                      Prior Function            PT Goals (current goals can now be found in the care plan section) Acute Rehab PT Goals Patient Stated Goal: get home to spouse PT Goal Formulation: With patient Time For Goal Achievement: 05/02/19 Potential to Achieve Goals: Fair Progress  towards PT goals: PT to reassess next treatment(She requires a great deal of encouragement and is currently limited by RIGHT knee pain.)    Frequency    Min 3X/week      PT Plan Current plan remains appropriate    Co-evaluation              AM-PAC PT "6 Clicks" Mobility   Outcome Measure  Help needed turning from your back to your side while in a flat bed without using bedrails?: None Help needed moving from lying on your back to sitting on the side of a flat bed without using bedrails?: A Little Help needed moving to and from a bed to a chair (including a wheelchair)?: A Little Help needed standing up from a chair using your arms (e.g., wheelchair or bedside chair)?: A Lot Help needed to walk in hospital room?: A Lot Help needed climbing 3-5 steps with a railing? : A Lot 6 Click Score: 16    End of Session     Patient left: in chair;with call bell/phone within reach;with chair alarm set   PT Visit Diagnosis: Unsteadiness on feet (R26.81);Other abnormalities of gait and mobility (R26.89);Muscle weakness (generalized) (M62.81);Pain Pain - Right/Left: Right Pain - part of body: Knee     Time: 0200-0233 PT Time Calculation (min) (ACUTE ONLY): 33 min  Charges:  $Therapeutic Exercise: 8-22 mins $Therapeutic Activity: 8-22 mins                   Rollen Sox, PT # [phone] CGV cell  Casandra Doffing 04/21/2019, 5:13 PM

## 2019-04-21 NOTE — Progress Notes (Signed)
PROGRESS NOTE  Wendy Velasquez [id number] DOB: [birth year] DOA: 04/17/2019 PCP: [name], MD   LOS: 4 days   Brief Narrative / Interim history: 86 y.o. female with medical history significant for prior complete heart block with cardiac arrest status post pacemaker placement, hypertension, hyperlipidemia, COPD, type 2 diabetes, GERD, COVID-19 viral infection on 04/04/2019 who presented with complaints of generalized weakness, decreased appetite, poor oral intake and productive cough. Went to see her PCP today due to presenting symptoms.  She was referred by her PCP due to hypoxia in the office.  O2 saturation dropped to the 80s on room air and she was dyspneic with ambulation. She was admitted to [facility].   Subjective / 24h Interval events: -Denies any shortness of breath, no cough, no chest congestion.  No abdominal pain No nausea or vomiting.  Complains of persistent right knee pain which is chronic for her.  Assessment & Plan:  Principal Problem Acute Hypoxic Respiratory Failure due to Covid-19 Viral Illness -Hypoxic in the PCPs office, however here she has been stable on room air and remained on room air while hospitalized -Continue to monitor -Given respiratory symptoms at home with cough and shortness of breath, COVID-19 positive she was started on remdesivir which is scheduled to be done on 2/10.  Steroids were not started due to lack of hypoxia -Continue to monitor inflammatory markers, CRP is increasing however clinically she is remained stable, no hypoxia. Her initial Covid diagnosis was 1/23 so she is almost at 3 weeks now, not clear why CRP is increasing   COVID-19 Labs  Recent Labs    04/19/19 0333 04/20/19 0331 04/21/19 0417  DDIMER 1.19* 1.18* 1.19*  CRP 15.2* 23.7* 27.4*   No results found for: SARSCOV2NAA  Active Problems Concern for bacterial community-acquired pneumonia in the setting of recent COVID-19 viral infection -Chest x-ray on  admission shows right upper lobe infiltrate suggesting lobar pneumonia -She was started on ceftriaxone, however she did not have leukocytosis, was afebrile, and procalcitonin was negative.  Antibiotics have been discontinued  Right knee, thigh, hip pain -There is a chronic component to it, however given thigh pain obtained lower extremity ultrasound to rule out a DVT, and this was negative  Generalized weakness in the setting of recent viral infection -PT/OT evaluated patient, recommended home health PT with 24-hour supervision.   -She lives in independent living  Heart block status post pacemaker/QTC prolongation -Stable, avoid QT prolonging agents  Type 2 diabetes mellitus -Hemoglobin A1c 6.9, continue sliding scale, add back home Lantus  CBG (last 3)  Recent Labs    04/20/19 1721 04/20/19 1929 04/21/19 0757  GLUCAP 238* 243* 255*   History of COPD -Continue bronchodilators, no wheezing, stable  Chronic anxiety/depression -Continue home medications   Scheduled Meds: . vitamin C  500 mg Oral Daily  . aspirin EC  81 mg Oral Daily  . cholecalciferol  1,000 Units Oral Daily  . enoxaparin (LOVENOX) injection  40 mg Subcutaneous Q24H  . gabapentin  100 mg Oral QHS  . gabapentin  200 mg Oral BID WC  . insulin aspart  0-5 Units Subcutaneous QHS  . insulin aspart  0-9 Units Subcutaneous TID WC  . insulin aspart  3 Units Subcutaneous TID WC  . insulin glargine  8 Units Subcutaneous Daily  . Ipratropium-Albuterol  1 puff Inhalation TID  . mirabegron ER  25 mg Oral Daily  . mometasone-formoterol  2 puff Inhalation BID  . pantoprazole  40 mg Oral  Daily  . sertraline  50 mg Oral BID  . vitamin B-12  1,000 mcg Oral Daily  . zinc sulfate  220 mg Oral Daily   Continuous Infusions: . remdesivir 100 mg in NS 100 mL Stopped (04/21/19 1115)   PRN Meds:.acetaminophen  DVT prophylaxis: Lovenox Code Status: Full code per patient, confirmed this again with her Family Communication:  d/w daughter Pershing Cox [phone] Patient admitted from: ILF Anticipated d/c place: TBD Barriers to d/c: Intravenous remdesivir  Consultants:  None  Procedures:  None   Microbiology: None   Antibacterials: None    Objective: Vitals:   04/20/19 2100 04/21/19 0020 04/21/19 0533 04/21/19 0747  BP: 116/80 122/67 115/64 (!) 117/52  Pulse: 82 80 76 81  Resp: 20 20 (!) 22 14  Temp: 98.3 F (36.8 C) 98 F (36.7 C) 97.8 F (36.6 C) 98.2 F (36.8 C)  TempSrc: Oral Oral Oral Oral  SpO2: 97% 93% 97% 97%  Weight:      Height:        Intake/Output Summary (Last 24 hours) at 04/21/2019 1124 Last data filed at 04/21/2019 1115 Gross per 24 hour  Intake 100 ml  Output 1000 ml  Net -900 ml   Filed Weights   04/17/19 1230  Weight: 54.8 kg    Examination:  Constitutional: NAD, appears comfortable Eyes: No scleral icterus ENMT: Moist mucous membranes Neck: normal, supple Respiratory: Clear to auscultation bilaterally, no wheezing, no crackles Cardiovascular: Regular rate and rhythm, no murmurs, no significant peripheral edema  Abdomen: Soft, nontender, nondistended, positive bowel sounds Musculoskeletal: no clubbing / cyanosis.  Right knee without swelling, redness, Skin: No rashes appreciated Neurologic: No focal deficits, equal strength Psychiatric: Normal judgment and insight.     Data Reviewed: I have independently reviewed following labs and imaging studies   CBC: Recent Labs  Lab 04/17/19 1321 04/18/19 0630 04/19/19 0333 04/20/19 0331 04/21/19 0417  WBC 9.9 9.4 10.3 10.8* 10.2  NEUTROABS 7.3  --   --   --   --   HGB 12.0 11.5* 11.3* 10.5* 10.1*  HCT 36.6 36.0 35.9* 32.8* 31.7*  MCV 87.6 88.5 89.3 88.4 88.1  PLT 373 358 399 335 [id number]   Basic Metabolic Panel: Recent Labs  Lab 04/17/19 1321 04/18/19 0630 04/19/19 0333 04/20/19 0331 04/21/19 0417  NA 137 137 140 136 136  K 4.5 4.2 4.4 4.1 4.1  CL 102 99 102 100 100  CO2 25 22 28 27 27   GLUCOSE  126* 144* 146* 199* 250*  BUN 31* 20 26* 25* 29*  CREATININE 0.90 0.73 0.74 0.67 0.65  CALCIUM 9.1 8.3* 9.3 8.9 9.1   GFR: Estimated Creatinine Clearance: 39.6 mL/min (by C-G formula based on SCr of 0.65 mg/dL). Liver Function Tests: Recent Labs  Lab 04/17/19 1321 04/18/19 0630 04/19/19 0333 04/20/19 0331 04/21/19 0417  AST 13* 14* 11* 12* 9*  ALT 13 13 13 12 12   ALKPHOS 66 63 67 63 66  BILITOT 0.6 0.5 0.5 0.5 0.5  PROT 6.7 6.1* 6.3* 5.6* 5.7*  ALBUMIN 3.1* 2.9* 3.0* 2.6* 2.5*   Recent Labs  Lab 04/17/19 1321  LIPASE 18   No results for input(s): AMMONIA in the last 168 hours. Coagulation Profile: No results for input(s): INR, PROTIME in the last 168 hours. Cardiac Enzymes: No results for input(s): CKTOTAL, CKMB, CKMBINDEX, TROPONINI in the last 168 hours. BNP (last 3 results) No results for input(s): PROBNP in the last 8760 hours. HbA1C: No results for input(s): HGBA1C in  the last 72 hours. CBG: Recent Labs  Lab 04/20/19 0745 04/20/19 1250 04/20/19 1721 04/20/19 1929 04/21/19 0757  GLUCAP 217* 391* 238* 243* 255*   Lipid Profile: No results for input(s): CHOL, HDL, LDLCALC, TRIG, CHOLHDL, LDLDIRECT in the last 72 hours. Thyroid Function Tests: No results for input(s): TSH, T4TOTAL, FREET4, T3FREE, THYROIDAB in the last 72 hours. Anemia Panel: No results for input(s): VITAMINB12, FOLATE, FERRITIN, TIBC, IRON, RETICCTPCT in the last 72 hours. Urine analysis:    Component Value Date/Time   COLORURINE YELLOW 04/17/2019 [place] 04/17/2019 1643   LABSPEC 1.020 04/17/2019 1643   PHURINE 5.0 04/17/2019 1643   GLUCOSEU NEGATIVE 04/17/2019 1643   [place] 04/17/2019 [place] 04/17/2019 1643   [place] 04/17/2019 1643   PROTEINUR NEGATIVE 04/17/2019 1643   UROBILINOGEN 0.2 09/11/2012 0927   NITRITE NEGATIVE 04/17/2019 1643   LEUKOCYTESUR NEGATIVE 04/17/2019 1643   Sepsis Labs: Invalid input(s):  PROCALCITONIN, LACTICIDVEN  Recent Results (from the past 240 hour(s))  Urine culture     Status: None   Collection Time: 04/17/19  4:43 PM   Specimen: Urine, Random  Result Value Ref Range Status   Specimen Description   Final    URINE, RANDOM Performed at [place] [address] [place], [place] [address]    Special Requests   Final    NONE Performed at [facility] [place] [address] [place] [address], [place], [place] [address]    Culture   Final    NO GROWTH Performed at [facility], [place] [address], [place], [place] [address]    Report Status 04/18/2019 FINAL  Final  Blood culture (routine x 2)     Status: None (Preliminary result)   Collection Time: 04/17/19  5:01 PM   Specimen: BLOOD  Result Value Ref Range Status   Specimen Description BLOOD RIGHT ANTECUBITAL  Final   Special Requests   Final    BOTTLES DRAWN AEROBIC AND ANAEROBIC Blood Culture results may not be optimal due to an excessive volume of blood received in culture bottles Performed at [facility], [place] [address] [place], [place] [address]    Culture   Final    NO GROWTH 4 DAYS Performed at [facility], [place] [address] [place], [place] [address]    Report Status PENDING  Incomplete  MRSA PCR Screening     Status: None   Collection Time: 04/18/19  1:23 AM   Specimen: Nasopharyngeal  Result Value Ref Range Status   MRSA by PCR NEGATIVE NEGATIVE Final    Comment:        The GeneXpert MRSA Assay (FDA approved for NASAL specimens only), is one component of a comprehensive MRSA colonization surveillance program. It is not intended to diagnose MRSA infection nor to guide or monitor treatment for MRSA infections. Performed at [facility], [place] [address] [place], [place] [address]       Radiology Studies: DG CHEST PORT 1 VIEW  Result Date: 04/21/2019 CLINICAL DATA:  CRP elevated. EXAM: PORTABLE CHEST 1 VIEW COMPARISON:   April 17, 2019. FINDINGS: Stable cardiomediastinal silhouette. Atherosclerosis of thoracic aorta is noted. Left-sided pacemaker is unchanged in position. No pneumothorax is noted. Left lung is clear. Stable right upper lobe opacity is noted concerning for possible pneumonia. Bony thorax is unremarkable. IMPRESSION: Stable right upper lobe opacity is noted concerning for possible pneumonia. Follow-up radiographs are recommended to ensure resolution. Aortic Atherosclerosis (  ICD10-I70.0). Electronically Signed   By: Marijo Conception M.D.   On: 04/21/2019 10:25   VAS [place] LOWER EXTREMITY VENOUS (DVT)  Result Date: 04/20/2019  Lower Venous DVT Study Indications: Chronic thigh pain, Covid-19.  Comparison Study: No prior study on file for comparison Performing Technologist: Sharion Dove RVS  Examination Guidelines: A complete evaluation includes B-mode imaging, spectral Doppler, color Doppler, and power Doppler as needed of all accessible portions of each vessel. Bilateral testing is considered an integral part of a complete examination. Limited examinations for reoccurring indications may be performed as noted. The reflux portion of the exam is performed with the patient in reverse Trendelenburg.  +---------+---------------+---------+-----------+----------+--------------+ RIGHT    CompressibilityPhasicitySpontaneityPropertiesThrombus Aging +---------+---------------+---------+-----------+----------+--------------+ CFV      Full           Yes      Yes                                 +---------+---------------+---------+-----------+----------+--------------+ SFJ      Full                                                        +---------+---------------+---------+-----------+----------+--------------+ FV Prox  Full                                                        +---------+---------------+---------+-----------+----------+--------------+ FV Mid   Full                                                         +---------+---------------+---------+-----------+----------+--------------+ FV DistalFull                                                        +---------+---------------+---------+-----------+----------+--------------+ PFV      Full                                                        +---------+---------------+---------+-----------+----------+--------------+ POP      Full           Yes      Yes                                 +---------+---------------+---------+-----------+----------+--------------+ PTV      Full                                                        +---------+---------------+---------+-----------+----------+--------------+  PERO     Full                                                        +---------+---------------+---------+-----------+----------+--------------+ Gastroc  Full                                                        +---------+---------------+---------+-----------+----------+--------------+   +---------+---------------+---------+-----------+----------+-------------------+ LEFT     CompressibilityPhasicitySpontaneityPropertiesThrombus Aging      +---------+---------------+---------+-----------+----------+-------------------+ CFV      Full           Yes      Yes                                      +---------+---------------+---------+-----------+----------+-------------------+ SFJ      Full                                                             +---------+---------------+---------+-----------+----------+-------------------+ FV Prox  Full                                                             +---------+---------------+---------+-----------+----------+-------------------+ FV Mid   Full                                                             +---------+---------------+---------+-----------+----------+-------------------+ FV DistalFull                                                              +---------+---------------+---------+-----------+----------+-------------------+ PFV      Full                                                             +---------+---------------+---------+-----------+----------+-------------------+ POP                     Yes      Yes                  patent by color and  Doppler             +---------+---------------+---------+-----------+----------+-------------------+ PTV      Full                                                             +---------+---------------+---------+-----------+----------+-------------------+ PERO     Full                                                             +---------+---------------+---------+-----------+----------+-------------------+     Summary: BILATERAL: - No evidence of deep vein thrombosis seen in the lower extremities, bilaterally.   *See table(s) above for measurements and observations. Electronically signed by Monica Martinez MD on 04/20/2019 at 9:23:49 AM.    Final    Marzetta Board, MD, PhD [facility]  Between 7 am - 7 pm I am available, please contact me via Amion or Securechat  Between 7 pm - 7 am I am not available, please contact night coverage MD/APP via Amion

## 2019-04-21 NOTE — Progress Notes (Signed)
Occupational Therapy Treatment Patient Details Name: Wendy Velasquez MRN: [id number] DOB: Jun 24, 1925 Today's Date: 04/21/2019    History of present illness Pt is a 86 y.o. female dx with COVID-19 on 04/04/19, now admitted from [facility] on 04/17/19 with generalized weakness, poor oral intake and productive cough. Worked up for acute hypoxic respiratory failure secondary to COVID-19. PMH includes pacemaker, HTN, COPD, DM2.   OT comments  BP soft at beginning of session - seated 112/57; standing 98/44. Pt able to ambulate to room sink from bed with min A. Completed seated ADL at sink level as noted below. BP seated after activity 110/51. SpO2 above 90 during activity. Mod A with bed mobility. Discussed DC plan with daughter over the phone and recommend physical assistance with all mobility and ADL. Daughter verbalized understanding and has appropriate amount of assistance set up. Pt limited by R knee pain - pain patch may help reduce pain and increase mobility in addition to use of ice. Heat place on neck - nsg made aware. Will continue to follow acutely.   Follow Up Recommendations  Home health OT;Supervision/Assistance - 24 hour    Equipment Recommendations  None recommended by OT    Recommendations for Other Services      Precautions / Restrictions Precautions Precautions: Fall Precaution Comments: R knee pain for years, causes difficulty walking; urine incontinence-- She states she gets injections in RIGHT knee every 3 months, and periodically has to have it "drained" Restrictions Weight Bearing Restrictions: No       Mobility Bed Mobility Overal bed mobility: Needs Assistance         Sit to supine: Mod assist      Transfers Overall transfer level: Needs assistance Equipment used: Rolling walker (2 wheeled) Transfers: Sit to/from Stand Sit to Stand: Min assist              Balance Overall balance assessment: Needs assistance   Sitting balance-Leahy Scale:  Fair       Standing balance-Leahy Scale: Poor                             ADL either performed or assessed with clinical judgement   ADL Overall ADL's : Needs assistance/impaired     Grooming: Set up;Supervision/safety Grooming Details (indicate cue type and reason): completed at sink     Lower Body Bathing: Moderate assistance;Sit to/from stand   Upper Body Dressing : Minimal assistance;Sitting   Lower Body Dressing: Moderate assistance;Sit to/from stand   Toilet Transfer: Minimal assistance;Ambulation;RW;BSC   Toileting- Water quality scientist and Hygiene: Supervision/safety;Sit to/from stand       Functional mobility during ADLs: Minimal assistance;Cueing for safety;Rolling walker General ADL Comments: improved endurance     Vision       Perception     Praxis      Cognition Arousal/Alertness: Awake/alert Behavior During Therapy: WFL for tasks assessed/performed Overall Cognitive Status: Within Functional Limits for tasks assessed                                 General Comments: Agreed to ex format. Stated she was tired and sleepy, that her knee was also bothering her.        Exercises General Exercises - Lower Extremity Ankle Circles/Pumps: AROM;Seated;10 reps Quad Sets: AROM;Seated;10 reps Gluteal Sets: AROM;Seated;10 reps Hip ABduction/ADduction: AROM;AAROM;Seated;Both;10 reps Straight Leg Raises: AAROM;Strengthening;Both;10 reps Other Exercises Other  Exercises: Practiced IS - she stated she did not know what it was for and had not seen it before. Cues for proper use and able to pull 750 for 6 out of 10 reps Other Exercises: TA- repositioning in chair for more optimal postural awareness. Explained the importance of sitting up in chair/benefits etc.   Shoulder Instructions       General Comments      Pertinent Vitals/ Pain       Pain Assessment: Faces Pain Score: 4  Faces Pain Scale: Hurts even more Pain Location: R  knee with any PROM/AROM and WB Pain Descriptors / Indicators: Grimacing;Guarding;Moaning Pain Intervention(s): Limited activity within patient's tolerance;Repositioned;Ice applied;Heat applied  Home Living                                          Prior Functioning/Environment              Frequency  Min 3X/week        Progress Toward Goals  OT Goals(current goals can now be found in the care plan section)  Progress towards OT goals: Progressing toward goals  Acute Rehab OT Goals Patient Stated Goal: get home to spouse OT Goal Formulation: With patient/family Time For Goal Achievement: 05/02/19 Potential to Achieve Goals: Good ADL Goals Pt Will Perform Grooming: with modified independence;standing Pt Will Perform Upper Body Bathing: sitting;with modified independence Pt Will Transfer to Toilet: with modified independence;ambulating Additional ADL Goal #1: Patient will increase activity tolerance to tolerate 5 min of standing ADL.  Plan Discharge plan remains appropriate;Frequency needs to be updated    Co-evaluation                 AM-PAC OT "6 Clicks" Daily Activity     Outcome Measure   Help from another person eating meals?: None Help from another person taking care of personal grooming?: A Little Help from another person toileting, which includes using toliet, bedpan, or urinal?: A Lot Help from another person bathing (including washing, rinsing, drying)?: A Lot Help from another person to put on and taking off regular upper body clothing?: A Little Help from another person to put on and taking off regular lower body clothing?: A Lot 6 Click Score: 16    End of Session Equipment Utilized During Treatment: Rolling walker  OT Visit Diagnosis: Unsteadiness on feet (R26.81);Muscle weakness (generalized) (M62.81)   Activity Tolerance Patient tolerated treatment well   Patient Left in bed;with call bell/phone within reach;with bed alarm  set   Nurse Communication Mobility status;Other (comment)(heat for neck; ice for R knee)        Time: 1610-1700 OT Time Calculation (min): 50 min  Charges: OT General Charges $OT Visit: 1 Visit OT Treatments $Self Care/Home Management : 38-52 mins  Maurie Boettcher, OT/L   Acute OT Clinical Specialist [place] Pager [phone] Office [phone]    [facility] 04/21/2019, 5:39 PM

## 2019-04-21 NOTE — TOC Initial Note (Signed)
Transition of Care [facility]) - Initial/Assessment Note    Patient Details  Name: JAMARI WALDRON MRN: [id number] Date of Birth: [birth year]  Transition of [facility]) CM/SW Contact:    Ross Ludwig, LCSW Phone Number: 04/21/2019, 5:42 PM  Clinical Narrative:                  Patient is a 86 year old female who is alert and oriented x4.  Patient is from [place] and lives with her husband.  CSW was asked to speak to patient's daughter by physician to discuss discharge plan.  CSW spoke to patient's daughter [name] and discussed PT recommendation for home health or 24 hour supervision.  CSW discussed SNF placement and how insurance will pay for stay at SNF.  CSW was informed by patient's daughter that before patient was admitted to hospital, she had set up care givers from [facility] private caregivers, also PT and OT through [employer].  CSW spoke to [place] at [place] and she confirmed that they were going to start to see patient but then she was admitted to the hospital.  Rollene Fare stated that in order for patient to be seen, she will need home health orders faxed to [phone] once patient is ready for discharge.    CSW discussed with patient's daughter that if patient goes home and they realize she needs more care then can be provided in home, [facility] can place patient in a SNF if needed.  Patient's daugther expressed understanding and would like to have patient go home verse going to a SNF.  CSW updated [facility], that per physician, patient may be ready for discharge tomorrow.  CSW spoke to OT and they recommended patient received a bedside commode before she discharges home.  CSW will request orders for DME.    Expected Discharge Plan: ([place] living.) Barriers to Discharge: Continued Medical Work up   Patient Goals and CMS Choice Patient states their goals for this hospitalization and ongoing recovery are:: To return back to [employer]. CMS Medicare.gov Compare Post Acute Care list provided to:: Patient Represenative (must comment) Choice offered to / list presented to : Adult Children  Expected Discharge Plan and Services Expected Discharge Plan: ([place] living.)     Post Acute Care Choice: [place] arrangements for the past 2 months: [place]                 DME Arranged: Bedside commode DME Agency: [place]: PT, OT [place] Agency: Other - See comment([facility]) Date [place]: 04/21/19 Time [place]: [place] Representative spoke with at [place] Arrangements/Services Living arrangements for the past 2 months: Materials engineer Lives with:: Spouse Patient language and need for interpreter reviewed:: Yes Do you feel safe going back to the place where you live?: Yes      Need for Family Participation in Patient Care: Yes (Comment) Care giver support system in place?: Yes (comment) Current home services: Home OT, Home PT Criminal Activity/Legal Involvement Pertinent to Current Situation/Hospitalization: No - Comment as needed  Activities of Daily Living Home Assistive Devices/Equipment: Dentures (specify type), Eyeglasses(walker, upper denture ) ADL Screening (condition at time of admission) Patient's cognitive ability adequate to safely complete daily activities?: Yes Is the patient deaf or have difficulty hearing?: No Does the patient have difficulty seeing, even when wearing glasses/contacts?: Yes Does the  patient have difficulty concentrating, remembering, or making decisions?: No Patient able to express need for assistance with ADLs?: Yes Does the patient have difficulty dressing or bathing?: Yes Independently performs ADLs?: No Communication: Independent Dressing (OT): Needs assistance Does the patient have difficulty walking or climbing stairs?: Yes Weakness of  Legs: Both Weakness of Arms/Hands: None  Permission Sought/Granted Permission sought to share information with : Family Supports Permission granted to share information with : Yes, Release of Information Signed  Share Information with NAME: [name],[name] Daughter [phone]  [phone]  Permission granted to share info w AGENCY: [place]        Emotional Assessment Appearance:: Appears stated age   Affect (typically observed): Accepting, Appropriate, Calm, Stable Orientation: : Oriented to Situation, Oriented to  Time, Oriented to Place, Oriented to Self Alcohol / Substance Use: Not Applicable Psych Involvement: No (comment)  Admission diagnosis:  Hypoxia [R09.02] Fatigue, unspecified type [R53.83] Diarrhea, unspecified type [R19.7] Community acquired pneumonia, unspecified laterality [J18.9] COVID-19 [U07.1] Patient Active Problem List   Diagnosis Date Noted  . Hypoxia 04/17/2019  . Neck pain 04/08/2018  . Cervical dystonia 01/02/2018  . Chronic neck pain 12/05/2017  . Sepsis ([place])   . Type 2 diabetes mellitus with hyperglycemia, with long-term current use of insulin ([place])   . Esophageal reflux   . COPD exacerbation ([place])   . Depression   . Physical deconditioning   . Severe protein-calorie malnutrition ([place])   . DM2 (diabetes mellitus, type 2) ([place]) 02/25/2015  . CAP (community acquired pneumonia) 02/24/2015  . Anorexia 10/01/2014  . S/P cardiac pacemaker procedure 09/13/2012  . Complete heart block ([place]) 09/11/2012  . Cardiac arrest - due to complete Heart Block, resolved 09/11/2012  . Essential hypertension 09/11/2012  . Hyperlipidemia - on staint 09/11/2012  . COPD (chronic obstructive pulmonary disease) ([place]) 09/11/2012  . Osteoporosis 09/11/2012  . GERD (gastroesophageal reflux disease) 09/11/2012  . Acute respiratory failure - resolved, extubated 11PM 7/3 09/11/2012  . Cardiogenic shock - resolved 09/11/2012   PCP:  [place], Fransico Him,  MD Pharmacy:   [facility] - [address] [place] [address] Phone: [phone] Fax: [place], [place] [facility] [address] AT [place] [place] [address] Phone: [phone] Fax: [phone]     Social Determinants of Health (SDOH) Interventions    Readmission Risk Interventions No flowsheet data found.

## 2019-04-21 NOTE — NC FL2 (Signed)
[place] LEVEL OF CARE SCREENING TOOL     IDENTIFICATION  Patient Name: Wendy Velasquez Birthdate: May 15, 1925 Sex: female Admission Date (Current Location): 04/17/2019  [facility] and [place] Number:  Herbalist and Address:  [place])      Provider Number: [id number]  Attending Physician Name and Address:  Caren Griffins, MD  Relative Name and Phone Number:  [name],[name] Daughter [phone]  [phone]    Current Level of Care: Hospital Recommended Level of Care: [place] Prior Approval Number:    Date Approved/Denied:   PASRR Number: [id number]  Discharge Plan: SNF    Current Diagnoses: Patient Active Problem List   Diagnosis Date Noted  . Hypoxia 04/17/2019  . Neck pain 04/08/2018  . Cervical dystonia 01/02/2018  . Chronic neck pain 12/05/2017  . Sepsis ([place])   . Type 2 diabetes mellitus with hyperglycemia, with long-term current use of insulin ([place])   . Esophageal reflux   . COPD exacerbation ([place])   . Depression   . Physical deconditioning   . Severe protein-calorie malnutrition ([place])   . DM2 (diabetes mellitus, type 2) ([place]) 02/25/2015  . CAP (community acquired pneumonia) 02/24/2015  . Anorexia 10/01/2014  . S/P cardiac pacemaker procedure 09/13/2012  . Complete heart block ([place]) 09/11/2012  . Cardiac arrest - due to complete Heart Block, resolved 09/11/2012  . Essential hypertension 09/11/2012  . Hyperlipidemia - on staint 09/11/2012  . COPD (chronic obstructive pulmonary disease) ([place]) 09/11/2012  . Osteoporosis 09/11/2012  . GERD (gastroesophageal reflux disease) 09/11/2012  . Acute respiratory failure - resolved, extubated 11PM 7/3 09/11/2012  . Cardiogenic shock - resolved 09/11/2012    Orientation RESPIRATION BLADDER Height & Weight     Self, Time, Situation, Place  Normal Continent Weight: 120 lb 12.8 oz (54.8 kg) Height:  5\' 5"  (165.1 cm)  BEHAVIORAL SYMPTOMS/MOOD NEUROLOGICAL BOWEL  NUTRITION STATUS      Continent Diet(Regular diet)  AMBULATORY STATUS COMMUNICATION OF NEEDS Skin   Limited Assist Verbally Normal                       Personal Care Assistance Level of Assistance  Bathing, Feeding, Dressing Bathing Assistance: Limited assistance Feeding assistance: Independent Dressing Assistance: Limited assistance     Functional Limitations Info  Sight, Hearing, Speech Sight Info: Adequate Hearing Info: Adequate Speech Info: Adequate    SPECIAL CARE FACTORS FREQUENCY  PT (By licensed PT), OT (By licensed OT)     PT Frequency: Minimum 5x a week OT Frequency: Minimum 5x a week            Contractures Contractures Info: Not present    Additional Factors Info  Code Status, Allergies, Insulin Sliding Scale, Psychotropic, Isolation Precautions Code Status Info: Full Code Allergies Info: NKA Psychotropic Info: sertraline (ZOLOFT) tablet 50 mg Insulin Sliding Scale Info: insulin aspart (novoLOG) injection 0-9 Units 3x a day with meals. Isolation Precautions Info: Droplet precautions     Current Medications (04/21/2019):  This is the current hospital active medication list Current Facility-Administered Medications  Medication Dose Route Frequency Provider Last Rate Last Admin  . acetaminophen (TYLENOL) tablet 650 mg  650 mg Oral Q6H PRN Kayleen Memos, DO   650 mg at 04/20/19 2205  . ascorbic acid (VITAMIN C) tablet 500 mg  500 mg Oral Daily Irene Pap N, DO   500 mg at 04/21/19 0844  . aspirin EC tablet 81 mg  81 mg Oral Daily Hall,  Carole N, DO   81 mg at 04/21/19 0845  . cholecalciferol (VITAMIN D3) tablet 1,000 Units  1,000 Units Oral Daily Irene Pap N, DO   1,000 Units at 04/21/19 0846  . enoxaparin (LOVENOX) injection 40 mg  40 mg Subcutaneous Q24H Irene Pap N, DO   40 mg at 04/20/19 2207  . gabapentin (NEURONTIN) capsule 100 mg  100 mg Oral QHS Hall, Archie Patten N, DO   100 mg at 04/20/19 2208  . gabapentin (NEURONTIN) capsule 200 mg  200 mg  Oral BID WC Leodis Sias T, RPH   200 mg at 04/21/19 1207  . insulin aspart (novoLOG) injection 0-5 Units  0-5 Units Subcutaneous QHS Kayleen Memos, DO   2 Units at 04/20/19 2100  . insulin aspart (novoLOG) injection 0-9 Units  0-9 Units Subcutaneous TID WC Irene Pap N, DO   3 Units at 04/21/19 1207  . insulin aspart (novoLOG) injection 3 Units  3 Units Subcutaneous TID WC Caren Griffins, MD   3 Units at 04/21/19 1208  . insulin glargine (LANTUS) injection 8 Units  8 Units Subcutaneous Daily Caren Griffins, MD   8 Units at 04/21/19 1207  . Ipratropium-Albuterol (COMBIVENT) respimat 1 puff  1 puff Inhalation TID Irene Pap N, DO   1 puff at 04/21/19 1211  . mirabegron ER (MYRBETRIQ) tablet 25 mg  25 mg Oral Daily Irene Pap N, DO   25 mg at 04/21/19 0844  . mometasone-formoterol (DULERA) 200-5 MCG/ACT inhaler 2 puff  2 puff Inhalation BID Irene Pap N, DO   2 puff at 04/21/19 0843  . pantoprazole (PROTONIX) EC tablet 40 mg  40 mg Oral Daily Irene Pap N, DO   40 mg at 04/21/19 0843  . remdesivir 100 mg in sodium chloride 0.9 % 100 mL IVPB  100 mg Intravenous Daily Caren Griffins, MD   Stopped at 04/21/19 1115  . sertraline (ZOLOFT) tablet 50 mg  50 mg Oral BID Irene Pap N, DO   50 mg at 04/21/19 0846  . vitamin B-12 (CYANOCOBALAMIN) tablet 1,000 mcg  1,000 mcg Oral Daily Irene Pap N, DO   1,000 mcg at 04/21/19 [id number]  . zinc sulfate capsule 220 mg  220 mg Oral Daily Irene Pap N, DO   220 mg at 04/21/19 [id number]     Discharge Medications: Please see discharge summary for a list of discharge medications.  Relevant Imaging Results:  Relevant Lab Results:   Additional Information SSN [id number]  Ross Ludwig, LCSW

## 2019-04-22 LAB — CBC
HCT: 32.6 % — ABNORMAL LOW (ref 36.0–46.0)
Hemoglobin: 10.1 g/dL — ABNORMAL LOW (ref 12.0–15.0)
MCH: 27.5 pg (ref 26.0–34.0)
MCHC: 31 g/dL (ref 30.0–36.0)
MCV: 88.8 fL (ref 80.0–100.0)
Platelets: 427 10*3/uL — ABNORMAL HIGH (ref 150–400)
RBC: 3.67 MIL/uL — ABNORMAL LOW (ref 3.87–5.11)
RDW: 14.7 % (ref 11.5–15.5)
WBC: 8.1 10*3/uL (ref 4.0–10.5)
nRBC: 0 % (ref 0.0–0.2)

## 2019-04-22 LAB — CULTURE, BLOOD (ROUTINE X 2): Culture: NO GROWTH

## 2019-04-22 LAB — COMPREHENSIVE METABOLIC PANEL
ALT: 12 U/L (ref 0–44)
AST: 13 U/L — ABNORMAL LOW (ref 15–41)
Albumin: 2.4 g/dL — ABNORMAL LOW (ref 3.5–5.0)
Alkaline Phosphatase: 68 U/L (ref 38–126)
Anion gap: 10 (ref 5–15)
BUN: 32 mg/dL — ABNORMAL HIGH (ref 8–23)
CO2: 27 mmol/L (ref 22–32)
Calcium: 8.9 mg/dL (ref 8.9–10.3)
Chloride: 102 mmol/L (ref 98–111)
Creatinine, Ser: 0.71 mg/dL (ref 0.44–1.00)
GFR calc Af Amer: >60 mL/min (ref >60)
GFR calc non Af Amer: >60 mL/min (ref >60)
Glucose, Bld: 237 mg/dL — ABNORMAL HIGH (ref 70–99)
Potassium: 4.3 mmol/L (ref 3.5–5.1)
Sodium: 139 mmol/L (ref 135–145)
Total Bilirubin: 0.6 mg/dL (ref 0.3–1.2)
Total Protein: 5.7 g/dL — ABNORMAL LOW (ref 6.5–8.1)

## 2019-04-22 LAB — GLUCOSE, CAPILLARY
Glucose-Capillary: 224 mg/dL — ABNORMAL HIGH (ref 70–99)
Glucose-Capillary: 212 mg/dL — ABNORMAL HIGH (ref 70–99)
Glucose-Capillary: 198 mg/dL — ABNORMAL HIGH (ref 70–99)
Glucose-Capillary: 106 mg/dL — ABNORMAL HIGH (ref 70–99)

## 2019-04-22 LAB — C-REACTIVE PROTEIN: CRP: 20.9 mg/dL — ABNORMAL HIGH (ref <1.0)

## 2019-04-22 LAB — D-DIMER, QUANTITATIVE: D-Dimer, Quant: 1.05 ug/mL-FEU — ABNORMAL HIGH (ref 0.00–0.50)

## 2019-04-22 NOTE — Progress Notes (Signed)
SATURATION QUALIFICATIONS: (This note is used to comply with regulatory documentation for home oxygen)  Patient Saturations on Room Air at Rest = 97%  Patient Saturations on Room Air while Ambulating = 94%    Please briefly explain why patient needs home oxygen: Home O2 not needed at his time.   Maurie Boettcher, OT/L   Acute OT Clinical Specialist Acute Rehabilitation Services Pager [phone] Office [phone]

## 2019-04-22 NOTE — Progress Notes (Signed)
PROGRESS NOTE  Wendy Velasquez [id number] DOB: 19-Jan-1926 DOA: 04/17/2019 PCP: [name], MD   LOS: 5 days   Brief Narrative / Interim history: 86 y.o. female with medical history significant for prior complete heart block with cardiac arrest status post pacemaker placement, hypertension, hyperlipidemia, COPD, type 2 diabetes, GERD, COVID-19 viral infection on 04/04/2019 who presented with complaints of generalized weakness, decreased appetite, poor oral intake and productive cough. Went to see her PCP today due to presenting symptoms.  She was referred by her PCP due to hypoxia in the office.  O2 saturation dropped to the 80s on room air and she was dyspneic with ambulation. She was admitted to [facility].  Subjective / 24h Interval events: Denies any shortness of breath, no cough, no chest congestion. No abdominal pain. No nausea or vomiting. Complains of persistent right knee pain which is chronic for her  Assessment & Plan:  Acute Hypoxic Respiratory Failure due to Covid-19 Viral Illness - Hypoxic in the PCPs office, however here she has been stable on room air and remained on room air while hospitalized - Continue to monitor - Given respiratory symptoms at home with cough and shortness of breath, COVID-19 positive she was started on remdesivir which is scheduled to be done on 2/10. Steroids were not started due to lack of hypoxia - CRP now improving Recent Labs    04/20/19 0331 04/21/19 0417 04/22/19 0316  DDIMER 1.18* 1.19* 1.05*  CRP 23.7* 27.4* 20.9*   Questionably concurrent bacterial community-acquired pneumonia in the setting of recent COVID-19 viral infection - Chest x-ray shows bilateral infiltrate/edema consistent with COVID - She was started on ceftriaxone, however she did not have leukocytosis, was afebrile, and procalcitonin was negative.  Antibiotics have been discontinued  Right knee, thigh, hip pain, chronic - There is a chronic component to  it, however given thigh pain obtained lower extremity ultrasound to rule out a DVT, and this was negative  Generalized weakness in the setting of recent viral infection - PT/OT evaluated patient, recommended home health PT with 24-hour supervision.   - She lives in independent living  Heart block status post pacemaker/QTC prolongation - Stable, avoid QT prolonging agents  Type 2 diabetes mellitus - Hemoglobin A1c 6.9, continue sliding scale, conitnue home Lantus  CBG (last 3)  Recent Labs    04/21/19 1723 04/22/19 0718 04/22/19 1143  GLUCAP 279* 224* 212*   History of COPD - Continue bronchodilators, no wheezing, stable  Chronic anxiety/depression - Continue home medications  Scheduled Meds: . vitamin C  500 mg Oral Daily  . aspirin EC  81 mg Oral Daily  . cholecalciferol  1,000 Units Oral Daily  . enoxaparin (LOVENOX) injection  40 mg Subcutaneous Q24H  . gabapentin  100 mg Oral QHS  . gabapentin  200 mg Oral BID WC  . insulin aspart  0-5 Units Subcutaneous QHS  . insulin aspart  0-9 Units Subcutaneous TID WC  . insulin aspart  3 Units Subcutaneous TID WC  . insulin glargine  8 Units Subcutaneous Daily  . Ipratropium-Albuterol  1 puff Inhalation TID  . lidocaine  1 patch Transdermal Q24H  . mirabegron ER  25 mg Oral Daily  . mometasone-formoterol  2 puff Inhalation BID  . pantoprazole  40 mg Oral Daily  . sertraline  50 mg Oral BID  . vitamin B-12  1,000 mcg Oral Daily  . zinc sulfate  220 mg Oral Daily    DVT prophylaxis: Lovenox Code Status: Full  code per patient, confirmed this again with her Family Communication: D/W daughter [name] Patient admitted from: ILF Anticipated d/c place: ILF - in the next 24-48h Barriers to d/c: Safe discharge  Consultants:  None  Procedures:  None   Microbiology: None   Antibacterials: None    Objective: Vitals:   04/21/19 2000 04/22/19 0000 04/22/19 0500 04/22/19 0721  BP: 124/66 115/68 121/66 124/62  Pulse: 80   77 82  Resp: (!) 22  (!) 24 15  Temp: 97.7 F (36.5 C) 97.8 F (36.6 C) 97.7 F (36.5 C) 98.6 F (37 C)  TempSrc: Oral Oral Oral Axillary  SpO2: 96%  97% 94%  Weight:      Height:        Intake/Output Summary (Last 24 hours) at 04/22/2019 1403 Last data filed at 04/22/2019 1100 Gross per 24 hour  Intake 700 ml  Output 840 ml  Net -140 ml   Filed Weights   04/17/19 1230  Weight: 54.8 kg    Examination:  Constitutional: NAD, appears comfortable Eyes: No scleral icterus ENMT: Moist mucous membranes Neck: normal, supple Respiratory: Clear to auscultation bilaterally, no wheezing, no crackles Cardiovascular: Regular rate and rhythm, no murmurs, no significant peripheral edema  Abdomen: Soft, nontender, nondistended, positive bowel sounds Musculoskeletal: no clubbing / cyanosis.  Right knee without swelling, redness, Skin: No rashes appreciated Neurologic: No focal deficits, equal strength  Data Reviewed: I have independently reviewed following labs and imaging studies   CBC: Recent Labs  Lab 04/17/19 1321 04/17/19 1321 04/18/19 0630 04/19/19 0333 04/20/19 0331 04/21/19 0417 04/22/19 0316  WBC 9.9   < > 9.4 10.3 10.8* 10.2 8.1  NEUTROABS 7.3  --   --   --   --   --   --   HGB 12.0   < > 11.5* 11.3* 10.5* 10.1* 10.1*  HCT 36.6   < > 36.0 35.9* 32.8* 31.7* 32.6*  MCV 87.6   < > 88.5 89.3 88.4 88.1 88.8  PLT 373   < > 358 399 335 357 427*   < > = values in this interval not displayed.   Basic Metabolic Panel: Recent Labs  Lab 04/18/19 0630 04/19/19 0333 04/20/19 0331 04/21/19 0417 04/22/19 0316  NA 137 140 136 136 139  K 4.2 4.4 4.1 4.1 4.3  CL 99 102 100 100 102  CO2 22 28 27 27 27   GLUCOSE 144* 146* 199* 250* 237*  BUN 20 26* 25* 29* 32*  CREATININE 0.73 0.74 0.67 0.65 0.71  CALCIUM 8.3* 9.3 8.9 9.1 8.9   GFR: Estimated Creatinine Clearance: 39.6 mL/min (by C-G formula based on SCr of 0.71 mg/dL).   Liver Function Tests: Recent Labs  Lab 04/18/19  0630 04/19/19 0333 04/20/19 0331 04/21/19 0417 04/22/19 0316  AST 14* 11* 12* 9* 13*  ALT 13 13 12 12 12   ALKPHOS 63 67 63 66 68  BILITOT 0.5 0.5 0.5 0.5 0.6  PROT 6.1* 6.3* 5.6* 5.7* 5.7*  ALBUMIN 2.9* 3.0* 2.6* 2.5* 2.4*   Recent Labs  Lab 04/17/19 1321  LIPASE 18   CBG: Recent Labs  Lab 04/21/19 0757 04/21/19 1142 04/21/19 1723 04/22/19 0718 04/22/19 1143  GLUCAP 255* 249* 279* 224* 212*   Urine analysis:    Component Value Date/Time   COLORURINE YELLOW 04/17/2019 [place] 04/17/2019 1643   LABSPEC 1.020 04/17/2019 1643   PHURINE 5.0 04/17/2019 1643   GLUCOSEU NEGATIVE 04/17/2019 1643   [place] 04/17/2019 1643  BILIRUBINUR NEGATIVE 04/17/2019 [place] 04/17/2019 1643   PROTEINUR NEGATIVE 04/17/2019 1643   UROBILINOGEN 0.2 09/11/2012 0927   NITRITE NEGATIVE 04/17/2019 1643   LEUKOCYTESUR NEGATIVE 04/17/2019 1643    Recent Results (from the past 240 hour(s))  Urine culture     Status: None   Collection Time: 04/17/19  4:43 PM   Specimen: Urine, Random  Result Value Ref Range Status   Specimen Description   Final    URINE, RANDOM Performed at [facility], [place] [address], [place], [place] [address]    Special Requests   Final    NONE Performed at [facility], [place] [address] [place], [place] [address]    Culture   Final    NO GROWTH Performed at [facility], [place] [address], [place], [place] [address]    Report Status 04/18/2019 FINAL  Final  Blood culture (routine x 2)     Status: None   Collection Time: 04/17/19  5:01 PM   Specimen: BLOOD  Result Value Ref Range Status   Specimen Description BLOOD RIGHT ANTECUBITAL  Final   Special Requests   Final    BOTTLES DRAWN AEROBIC AND ANAEROBIC Blood Culture results may not be optimal due to an excessive volume of blood received in culture bottles Performed at [facility], [place] [address]  [place], [place] [address]    Culture   Final    NO GROWTH 5 DAYS Performed at [facility], [place] [address] [place], [place] [address]    Report Status 04/22/2019 FINAL  Final  MRSA PCR Screening     Status: None   Collection Time: 04/18/19  1:23 AM   Specimen: Nasopharyngeal  Result Value Ref Range Status   MRSA by PCR NEGATIVE NEGATIVE Final    Comment:        The GeneXpert MRSA Assay (FDA approved for NASAL specimens only), is one component of a comprehensive MRSA colonization surveillance program. It is not intended to diagnose MRSA infection nor to guide or monitor treatment for MRSA infections. Performed at [facility], [place] [address] [place], [place] [address]     Radiology Studies: DG CHEST PORT 1 VIEW  Result Date: 04/21/2019 CLINICAL DATA:  CRP elevated. EXAM: PORTABLE CHEST 1 VIEW COMPARISON:  April 17, 2019. FINDINGS: Stable cardiomediastinal silhouette. Atherosclerosis of thoracic aorta is noted. Left-sided pacemaker is unchanged in position. No pneumothorax is noted. Left lung is clear. Stable right upper lobe opacity is noted concerning for possible pneumonia. Bony thorax is unremarkable. IMPRESSION: Stable right upper lobe opacity is noted concerning for possible pneumonia. Follow-up radiographs are recommended to ensure resolution. Aortic Atherosclerosis (ICD10-I70.0). Electronically Signed   By: Marijo Conception M.D.   On: 04/21/2019 10:25   Holli Humbles DO [facility]

## 2019-04-22 NOTE — Progress Notes (Signed)
SATURATION QUALIFICATIONS: (This note is used to comply with regulatory documentation for home oxygen)  Patient Saturations on Room Air at Rest = 94%  Patient Saturations on Room Air while Ambulating = 88%  Patient Saturations on 0 Liters of oxygen while Ambulating = N/A  Please briefly explain why patient needs home oxygen:   Patient walked a short distance from the [facility] to her chair and during this time she dropped down to 88%. Once she was sitting down she was able to recover very quickly and her sats increased over 90%. She was not placed on oxygen while transferring.

## 2019-04-22 NOTE — Progress Notes (Signed)
Inpatient Diabetes Program Recommendations  AACE/ADA: New Consensus Statement on Inpatient Glycemic Control (2015)  Target Ranges:  Prepandial:   less than 140 mg/dL      Peak postprandial:   less than 180 mg/dL (1-2 hours)      Critically ill patients:  140 - 180 mg/dL   Lab Results  Component Value Date   GLUCAP 224 (H) 04/22/2019   HGBA1C 6.9 (H) 04/18/2019    Review of Glycemic Control Results for Wendy Velasquez, Wendy Velasquez (MRN [id number]) as of 04/22/2019 11:11  Ref. Range 04/21/2019 07:57 04/21/2019 11:42 04/21/2019 17:23 04/22/2019 07:18  Glucose-Capillary Latest Ref Range: 70 - 99 mg/dL 255 (H) 249 (H) 279 (H) 224 (H)   Admit COVID+ (diagnosed 04/04/2019)/ SOB  History: DM2, Complete heart block with cardiac arrest status post pacemaker placement, COPD   Home DM Meds: Lantus 12 units QHS                              Metformin 1000 mg BID   Current Orders: Novolog Sensitive Correction Scale/ SSI (0-9 units) TID AC + HS, Novolog 3 units TID, Lantus 8 units QD     MD- Please consider the following in-hospital insulin adjustments:  Increase Lantus to 10 units QD.  Thanks, Bronson Curb, MSN, RNC-OB Diabetes Coordinator [phone] (8a-5p)

## 2019-04-22 NOTE — Progress Notes (Signed)
Physical Therapy Treatment Patient Details Name: Wendy Velasquez MRN: [id number] DOB: Jul 12, 1925 Today's Date: 04/22/2019    History of Present Illness Pt is a 86 y.o. female dx with COVID-19 on 04/04/19, now admitted from [place] on 04/17/19 with generalized weakness, poor oral intake and productive cough. Worked up for acute hypoxic respiratory failure secondary to COVID-19. PMH includes pacemaker, HTN, COPD, DM2.    PT Comments    She was sitting on edge of bed, having finished OT session. No adverse effects. Had been up in chair, but preferred to go back to bed for a nap when PT session was complete. Practiced sit<>stand with Rollator x 4, with Min Guard and cues. Ambulated to window and practiced watering her flowers in a vase. Ambulated to door and back to bed, min Guard, close supervision. Min Guard for return to bed for proper positioning. Applied warm compress to right side of neck to relieve some discomfort. All lines clear. O2 maintaining at 91-93% while supine. At end of session, bed was in low position, remote/call light, telephone both in reach. BS table next to bed. Brought her some Diet Ginger Ale at her request. Covers in place. Should continue to benefit from ongoing PT. Patient has been advised by physician that she is medically stable to consider going home with [facility]. Possible discharge in next "couple of days"  Follow Up Recommendations  Home health PT;Supervision/Assistance - 24 hour     Equipment Recommendations  (Does best with Rollator.)    Recommendations for Other Services       Precautions / Restrictions Precautions Precautions: Fall Precaution Comments: R knee pain for years, causes difficulty walking; urine incontinence-- She states she gets injections in RIGHT knee every 3 months, and periodically has to have it "drained"..OT did suggest the Lidocaine patch, but it was placed on her arm, not her knee. Warm compress was placed on right side of neck with  good relief of discomfort. Restrictions Weight Bearing Restrictions: No    Mobility  Bed Mobility Overal bed mobility: Needs Assistance Bed Mobility: Supine to Sit;Sit to Supine     Supine to sit: Min guard Sit to supine: Mod assist   General bed mobility comments: Sitting on edge of bed when PT entered room- but repositioned in bed following ex and ambulation with rollator.  Transfers Overall transfer level: Needs assistance Equipment used: 4-wheeled walker(Used Rollator) Transfers: Sit to/from Stand Sit to Stand: Min assist Stand pivot transfers: Min guard       General transfer comment: [employer] and close supervision during ambulation with Rollator. O2 sats maintained at 92-95% during activity.  Ambulation/Gait Ambulation/Gait assistance: Min guard;Supervision Gait Distance (Feet): 32 Feet Assistive device: 4-wheeled walker Gait Pattern/deviations: Step-to pattern;Decreased weight shift to right;Antalgic;Trunk flexed   Gait velocity interpretation: 1.31 - 2.62 ft/sec, indicative of limited community ambulator General Gait Details: Slow, antalgic steps from bed to recliner with RW and consistent minA to maintain balance, pt limited by pain and difficulty WB thru RLE; min-A to maneuver RW; cues for sequencing- States her right knee is uncomfortable.   Stairs             Wheelchair Mobility    Modified Rankin (Stroke Patients Only)       Balance     Sitting balance-Leahy Scale: Fair       Standing balance-Leahy Scale: Poor  Cognition Arousal/Alertness: Awake/alert Behavior During Therapy: WFL for tasks assessed/performed Overall Cognitive Status: Within Functional Limits for tasks assessed                                 General Comments: most likely baseline       Exercises General Exercises - Lower Extremity Ankle Circles/Pumps: AROM;Seated;Both Short Arc Quad: AROM;Seated;Both Other  Exercises Other Exercises: Practiced IS and able to achieve 800 for 8 out of 10 reps. Other Exercises: TA for repositioning in bed with HOB elevated. Preferred to lie on right side as she wanted to take a nap. Reminded of postural awareness when in bed for meals or in chair    General Comments        Pertinent Vitals/Pain Pain Assessment: Faces Faces Pain Scale: Hurts little more Pain Location: R knee with any PROM/AROM and WB Pain Descriptors / Indicators: Aching;Constant Pain Intervention(s): Limited activity within patient's tolerance    Home Living                      Prior Function            PT Goals (current goals can now be found in the care plan section) Acute Rehab PT Goals Patient Stated Goal: get home to spouse PT Goal Formulation: With patient Time For Goal Achievement: 05/02/19 Potential to Achieve Goals: Good Progress towards PT goals: Progressing toward goals    Frequency    Min 3X/week      PT Plan Current plan remains appropriate    Co-evaluation              AM-PAC PT "6 Clicks" Mobility   Outcome Measure  Help needed turning from your back to your side while in a flat bed without using bedrails?: None Help needed moving from lying on your back to sitting on the side of a flat bed without using bedrails?: A Little Help needed moving to and from a bed to a chair (including a wheelchair)?: A Little Help needed standing up from a chair using your arms (e.g., wheelchair or bedside chair)?: A Little Help needed to walk in hospital room?: A Little Help needed climbing 3-5 steps with a railing? : A Lot 6 Click Score: 18    End of Session Equipment Utilized During Treatment: Gait belt Activity Tolerance: Patient limited by pain(Right knee pain.) Patient left: in bed;with call bell/phone within reach Nurse Communication: Mobility status PT Visit Diagnosis: Unsteadiness on feet (R26.81);Other abnormalities of gait and mobility  (R26.89);Muscle weakness (generalized) (M62.81);Pain Pain - Right/Left: Right Pain - part of body: Knee     Time: 0210-0255 PT Time Calculation (min) (ACUTE ONLY): 45 min  Charges:  $Gait Training: 8-22 mins $Therapeutic Exercise: 8-22 mins $Therapeutic Activity: 8-22 mins          Rollen Sox, PT # [phone] CGV cell          Casandra Doffing 04/22/2019, 4:26 PM

## 2019-04-22 NOTE — Progress Notes (Signed)
Occupational Therapy Treatment Patient Details Name: Wendy Velasquez MRN: [id number] DOB: 1926/01/29 Today's Date: 04/22/2019    History of present illness Pt is a 86 y.o. female dx with COVID-19 on 04/04/19, now admitted from [place] on 04/17/19 with generalized weakness, poor oral intake and productive cough. Worked up for acute hypoxic respiratory failure secondary to COVID-19. PMH includes pacemaker, HTN, COPD, DM2.   OT comments  Making good progress. Able to ambulate into bathroom to toilet and complete standing grooming task at sink using rollator on RA with VSS. Continue to recommend DC home with [facility] and 24/7 assistance. Attempted to call duaghter and update on progress - no answer. Will continue to follow acutely.    Follow Up Recommendations  Home health OT;Supervision/Assistance - 24 hour    Equipment Recommendations  3 in 1 bedside commode    Recommendations for Other Services      Precautions / Restrictions Precautions Precautions: Fall       Mobility Bed Mobility               General bed mobility comments: OOB in chair  Transfers                      Balance     Sitting balance-Leahy Scale: Fair       Standing balance-Leahy Scale: Poor                             ADL either performed or assessed with clinical judgement   ADL Overall ADL's : Needs assistance/impaired     Grooming: Set up;Supervision/safety;Standing;Sitting Grooming Details (indicate cue type and reason): use of rollator                 Toilet Transfer: Ambulation;Minimal assistance;Cueing for safety   Toileting- Clothing Manipulation and Hygiene: Sit to/from stand;Moderate assistance       Functional mobility during ADLs: Minimal assistance;Rolling walker;Cueing for safety(better with use of rollaotr)       Vision       Perception     Praxis      Cognition Arousal/Alertness: Awake/alert Behavior During Therapy: WFL for  tasks assessed/performed Overall Cognitive Status: Within Functional Limits for tasks assessed                                 General Comments: most likely baseline         Exercises     Shoulder Instructions       General Comments      Pertinent Vitals/ Pain       Pain Assessment: Faces Faces Pain Scale: Hurts little more Pain Location: R knee with any PROM/AROM and WB Pain Descriptors / Indicators: Grimacing;Guarding;Moaning Pain Intervention(s): Limited activity within patient's tolerance  Home Living                                          Prior Functioning/Environment              Frequency  Min 3X/week        Progress Toward Goals  OT Goals(current goals can now be found in the care plan section)  Progress towards OT goals: Progressing toward goals  Acute Rehab OT Goals Patient Stated Goal: get home  to spouse OT Goal Formulation: With patient/family Time For Goal Achievement: 05/02/19 Potential to Achieve Goals: Good ADL Goals Pt Will Perform Grooming: with modified independence;standing Pt Will Perform Upper Body Bathing: sitting;with modified independence Pt Will Transfer to Toilet: with modified independence;ambulating Additional ADL Goal #1: Patient will increase activity tolerance to tolerate 5 min of standing ADL.  Plan Discharge plan remains appropriate    Co-evaluation                 AM-PAC OT "6 Clicks" Daily Activity     Outcome Measure   Help from another person eating meals?: None Help from another person taking care of personal grooming?: A Little Help from another person toileting, which includes using toliet, bedpan, or urinal?: A Lot Help from another person bathing (including washing, rinsing, drying)?: A Lot Help from another person to put on and taking off regular upper body clothing?: A Little Help from another person to put on and taking off regular lower body clothing?: A Lot 6  Click Score: 16    End of Session Equipment Utilized During Treatment: Gait belt;Other (comment)(rollator)  OT Visit Diagnosis: Unsteadiness on feet (R26.81);Muscle weakness (generalized) (M62.81)   Activity Tolerance Patient tolerated treatment well   Patient Left Other (comment)(sitting EOB with PT)   Nurse Communication Mobility status;Other (comment)(DC plans)        Time: 1400-1440 OT Time Calculation (min): 40 min  Charges: OT General Charges $OT Visit: 1 Visit OT Treatments $Self Care/Home Management : 38-52 mins  Maurie Boettcher, OT/L   Acute OT Clinical Specialist [place] Pager [phone] Office [phone]    [facility] 04/22/2019, 2:57 PM

## 2019-04-23 DIAGNOSIS — U071 COVID-19: Secondary | ICD-10-CM

## 2019-04-23 DIAGNOSIS — J9601 Acute respiratory failure with hypoxia: Secondary | ICD-10-CM

## 2019-04-23 HISTORY — DX: Acute respiratory failure with hypoxia: J96.01

## 2019-04-23 HISTORY — DX: COVID-19: U07.1

## 2019-04-23 LAB — GLUCOSE, CAPILLARY
Glucose-Capillary: 213 mg/dL — ABNORMAL HIGH (ref 70–99)
Glucose-Capillary: 230 mg/dL — ABNORMAL HIGH (ref 70–99)

## 2019-04-23 NOTE — TOC Transition Note (Signed)
Transition of Care [facility]) - CM/SW Discharge Note   Patient Details  Name: Wendy Velasquez MRN: [id number] Date of Birth: [birth year]  Transition of Care [facility]) CM/SW Contact:  Ross Ludwig, LCSW Phone Number: 04/23/2019, 12:32 PM   Clinical Narrative:     Patient will be going home with home health through [facility].  CSW signing off please reconsult with any other social work needs, home health agency has been notified of planned discharge.  Patient's daughter is also aware of patient discharging home, she is requesting EMS transport.   Final next level of care: Other (comment)([facility]) Barriers to Discharge: Barriers Resolved   Patient Goals and CMS Choice Patient states their goals for this hospitalization and ongoing recovery are:: To return back home with home health. CMS Medicare.gov Compare Post Acute Care list provided to:: Patient Represenative (must comment) Choice offered to / list presented to : Adult Children  Discharge Placement                Patient to be transferred to facility by: [place] Name of family member notified: Patient's daughter [name] Patient and family notified of of transfer: 04/23/19  Discharge Plan and Services     Post Acute Care Choice: Home Health          DME Arranged: Bedside commode DME Agency: [employer] Date DME Agency Contacted: 04/23/19 Time DME Agency Contacted: 11 Representative spoke with at DME Agency: Thedore Mins at [place]: PT, OT [facility] Agency: Other - See comment([facility]) Date [place]: 04/23/19 Time [place]: [id number] Representative spoke with at [place] ([place]) Interventions     Readmission Risk Interventions No flowsheet data found.

## 2019-04-23 NOTE — Progress Notes (Addendum)
Spoke with her daughter [name] regarding discharge instructions and told her that I would call her once EMS was here to pick up the patient. Patient's belonging returned to the patient, including her clothes, glasses and dentures. Discharge paper work placed with belongings, waiting on [facility] to arrive. Will cont to monitor.   UPDATE 1330: [facility] has arrived to transport patient, daughter, [name] notified.

## 2019-04-23 NOTE — Discharge Instructions (Signed)
COVID-19 COVID-19 is a respiratory infection that is caused by a virus called severe acute respiratory syndrome coronavirus 2 (SARS-CoV-2). The disease is also known as coronavirus disease or novel coronavirus. In some people, the virus may not cause any symptoms. In others, it may cause a serious infection. The infection can get worse quickly and can lead to complications, such as:  Pneumonia, or infection of the lungs.  Acute respiratory distress syndrome or ARDS. This is a condition in which fluid build-up in the lungs prevents the lungs from filling with air and passing oxygen into the blood.  Acute respiratory failure. This is a condition in which there is not enough oxygen passing from the lungs to the body or when carbon dioxide is not passing from the lungs out of the body.  Sepsis or septic shock. This is a serious bodily reaction to an infection.  Blood clotting problems.  Secondary infections due to bacteria or fungus.  Organ failure. This is when your body's organs stop working. The virus that causes COVID-19 is contagious. This means that it can spread from person to person through droplets from coughs and sneezes (respiratory secretions). What are the causes? This illness is caused by a virus. You may catch the virus by:  Breathing in droplets from an infected person. Droplets can be spread by a person breathing, speaking, singing, coughing, or sneezing.  Touching something, like a table or a doorknob, that was exposed to the virus (contaminated) and then touching your mouth, nose, or eyes. What increases the risk? Risk for infection You are more likely to be infected with this virus if you:  Are within 6 feet (2 meters) of a person with COVID-19.  Provide care for or live with a person who is infected with COVID-19.  Spend time in crowded indoor spaces or live in shared housing. Risk for serious illness You are more likely to become seriously ill from the virus if you:   Are 50 years of age or older. The higher your age, the more you are at risk for serious illness.  Live in a nursing home or long-term care facility.  Have cancer.  Have a long-term (chronic) disease such as: ? Chronic lung disease, including chronic obstructive pulmonary disease or asthma. ? A long-term disease that lowers your body's ability to fight infection (immunocompromised). ? Heart disease, including heart failure, a condition in which the arteries that lead to the heart become narrow or blocked (coronary artery disease), a disease which makes the heart muscle thick, weak, or stiff (cardiomyopathy). ? Diabetes. ? Chronic kidney disease. ? Sickle cell disease, a condition in which red blood cells have an abnormal "sickle" shape. ? Liver disease.  Are obese. What are the signs or symptoms? Symptoms of this condition can range from mild to severe. Symptoms may appear any time from 2 to 14 days after being exposed to the virus. They include:  A fever or chills.  A cough.  Difficulty breathing.  Headaches, body aches, or muscle aches.  Runny or stuffy (congested) nose.  A sore throat.  New loss of taste or smell. Some people may also have stomach problems, such as nausea, vomiting, or diarrhea. Other people may not have any symptoms of COVID-19. How is this diagnosed? This condition may be diagnosed based on:  Your signs and symptoms, especially if: ? You live in an area with a COVID-19 outbreak. ? You recently traveled to or from an area where the virus is common. ? You   provide care for or live with a person who was diagnosed with COVID-19. ? You were exposed to a person who was diagnosed with COVID-19.  A physical exam.  Lab tests, which may include: ? Taking a sample of fluid from the back of your nose and throat (nasopharyngeal fluid), your nose, or your throat using a swab. ? A sample of mucus from your lungs (sputum). ? Blood tests.  Imaging tests, which  may include, X-rays, CT scan, or ultrasound. How is this treated? At present, there is no medicine to treat COVID-19. Medicines that treat other diseases are being used on a trial basis to see if they are effective against COVID-19. Your health care provider will talk with you about ways to treat your symptoms. For most people, the infection is mild and can be managed at home with rest, fluids, and over-the-counter medicines. Treatment for a serious infection usually takes places in a hospital intensive care unit (ICU). It may include one or more of the following treatments. These treatments are given until your symptoms improve.  Receiving fluids and medicines through an IV.  Supplemental oxygen. Extra oxygen is given through a tube in the nose, a face mask, or a hood.  Positioning you to lie on your stomach (prone position). This makes it easier for oxygen to get into the lungs.  Continuous positive airway pressure (CPAP) or bi-level positive airway pressure (BPAP) machine. This treatment uses mild air pressure to keep the airways open. A tube that is connected to a motor delivers oxygen to the body.  Ventilator. This treatment moves air into and out of the lungs by using a tube that is placed in your windpipe.  Tracheostomy. This is a procedure to create a hole in the neck so that a breathing tube can be inserted.  Extracorporeal membrane oxygenation (ECMO). This procedure gives the lungs a chance to recover by taking over the functions of the heart and lungs. It supplies oxygen to the body and removes carbon dioxide. Follow these instructions at home: Lifestyle  If you are sick, stay home except to get medical care. Your health care provider will tell you how long to stay home. Call your health care provider before you go for medical care.  Rest at home as told by your health care provider.  Do not use any products that contain nicotine or tobacco, such as cigarettes, e-cigarettes, and  chewing tobacco. If you need help quitting, ask your health care provider.  Return to your normal activities as told by your health care provider. Ask your health care provider what activities are safe for you. General instructions  Take over-the-counter and prescription medicines only as told by your health care provider.  Drink enough fluid to keep your urine pale yellow.  Keep all follow-up visits as told by your health care provider. This is important. How is this prevented?  There is no vaccine to help prevent COVID-19 infection. However, there are steps you can take to protect yourself and others from this virus. To protect yourself:   Do not travel to areas where COVID-19 is a risk. The areas where COVID-19 is reported change often. To identify high-risk areas and travel restrictions, check the CDC travel website: wwwnc.cdc.gov/travel/notices  If you live in, or must travel to, an area where COVID-19 is a risk, take precautions to avoid infection. ? Stay away from people who are sick. ? Wash your hands often with soap and water for 20 seconds. If soap and water   are not available, use an alcohol-based hand sanitizer. ? Avoid touching your mouth, face, eyes, or nose. ? Avoid going out in public, follow guidance from your state and local health authorities. ? If you must go out in public, wear a cloth face covering or face mask. Make sure your mask covers your nose and mouth. ? Avoid crowded indoor spaces. Stay at least 6 feet (2 meters) away from others. ? Disinfect objects and surfaces that are frequently touched every day. This may include:  Counters and tables.  Doorknobs and light switches.  Sinks and faucets.  Electronics, such as phones, remote controls, keyboards, computers, and tablets. To protect others: If you have symptoms of COVID-19, take steps to prevent the virus from spreading to others.  If you think you have a COVID-19 infection, contact your health care  provider right away. Tell your health care team that you think you may have a COVID-19 infection.  Stay home. Leave your house only to seek medical care. Do not use public transport.  Do not travel while you are sick.  Wash your hands often with soap and water for 20 seconds. If soap and water are not available, use alcohol-based hand sanitizer.  Stay away from other members of your household. Let healthy household members care for children and pets, if possible. If you have to care for children or pets, wash your hands often and wear a mask. If possible, stay in your own room, separate from others. Use a different bathroom.  Make sure that all people in your household wash their hands well and often.  Cough or sneeze into a tissue or your sleeve or elbow. Do not cough or sneeze into your hand or into the air.  Wear a cloth face covering or face mask. Make sure your mask covers your nose and mouth. Where to find more information  Centers for Disease Control and Prevention: www.cdc.gov/coronavirus/2019-ncov/index.html  World Health Organization: www.who.int/health-topics/coronavirus Contact a health care provider if:  You live in or have traveled to an area where COVID-19 is a risk and you have symptoms of the infection.  You have had contact with someone who has COVID-19 and you have symptoms of the infection. Get help right away if:  You have trouble breathing.  You have pain or pressure in your chest.  You have confusion.  You have bluish lips and fingernails.  You have difficulty waking from sleep.  You have symptoms that get worse. These symptoms may represent a serious problem that is an emergency. Do not wait to see if the symptoms will go away. Get medical help right away. Call your local emergency services (911 in the [place] Do not drive yourself to the hospital. Let the emergency medical personnel know if you think you have COVID-19. Summary  COVID-19 is a  respiratory infection that is caused by a virus. It is also known as coronavirus disease or novel coronavirus. It can cause serious infections, such as pneumonia, acute respiratory distress syndrome, acute respiratory failure, or sepsis.  The virus that causes COVID-19 is contagious. This means that it can spread from person to person through droplets from breathing, speaking, singing, coughing, or sneezing.  You are more likely to develop a serious illness if you are 50 years of age or older, have a weak immune system, live in a nursing home, or have chronic disease.  There is no medicine to treat COVID-19. Your health care provider will talk with you about ways to treat your symptoms.    Take steps to protect yourself and others from infection. Wash your hands often and disinfect objects and surfaces that are frequently touched every day. Stay away from people who are sick and wear a mask if you are sick. This information is not intended to replace advice given to you by your health care provider. Make sure you discuss any questions you have with your health care provider. Document Revised: 12/26/2018 Document Reviewed: 04/03/2018 [employer] Patient Education  2020 [employer]  

## 2019-04-23 NOTE — Discharge Summary (Addendum)
Physician Discharge Summary  Wendy Velasquez DDU:[id number] DOB: 07/03/1925 DOA: 04/17/2019  PCP: [name], MD  Admit date: 04/17/2019 Discharge date: 04/23/2019  Admitted From: Home Disposition: Home(assisted living facility)  Recommendations for Outpatient Follow-up:  1. Follow up with PCP in 1-2 weeks 2. Please obtain BMP/CBC in one week  Home Health:Y  Discharge Condition: Stable CODE STATUS: Full Diet recommendation: As tolerated  Brief/Interim Summary: 86 y.o.femalewith medical history significant forprior complete heart block with cardiac arrest status post pacemakerplacement, hypertension, hyperlipidemia, COPD,type 2diabetes, GERD, COVID-19 viral infection on 04/04/2019 who presented with complaints of generalized weakness,decreasedappetite, poororal intake and productive cough. Went to see her PCP today due to presenting symptoms.She was referred by her PCP due to hypoxia in the office. O2 saturation dropped to the 80s on room air and she was dyspneic with ambulation. She was admitted to [facility].  Patient met as above with acute hypoxic respiratory failure in the setting of COVID-19 pneumonia.  Patient's hypoxia resolved appropriately with treatment including remdesivir.  This time patient remains without hypoxia at rest or with exertion, otherwise resolved COVID-19 pneumonia and otherwise stable and agreeable for discharge back home to assisted living facility with her husband, home health, around-the-clock care with caretakers provided by family as well as family.  We recommend close follow-up with PCP in the next 1 to 2 weeks to ensure resolution of symptoms and to ensure ongoing improvement with ambulation, dyspnea with exertion and physical therapy.   Discharge Diagnoses:  Principal Problem:   Acute hypoxemic respiratory failure due to COVID-19 [facility]) Active Problems:   Hypoxia    Discharge Instructions  Discharge Instructions    Call MD  for:  difficulty breathing, headache or visual disturbances   Complete by: As directed    Call MD for:  extreme fatigue   Complete by: As directed    Call MD for:  hives   Complete by: As directed    Call MD for:  persistant dizziness or light-headedness   Complete by: As directed    Call MD for:  persistant nausea and vomiting   Complete by: As directed    Call MD for:  severe uncontrolled pain   Complete by: As directed    Call MD for:  temperature >100.4   Complete by: As directed    Diet - low sodium heart healthy   Complete by: As directed    Increase activity slowly   Complete by: As directed      Allergies as of 04/23/2019   No Known Allergies     Medication List    STOP taking these medications   diclofenac Sodium 1 % Gel Commonly known as: VOLTAREN     TAKE these medications   acetaminophen 500 MG tablet Commonly known as: TYLENOL Take 1,000 mg by mouth every 6 (six) hours as needed for fever.   Advair Diskus 250-50 MCG/DOSE Aepb Generic drug: Fluticasone-Salmeterol Inhale 1 puff into the lungs 2 (two) times daily.   albuterol (2.5 MG/3ML) 0.083% nebulizer solution Commonly known as: PROVENTIL Take 2.5 mg by nebulization every 6 (six) hours as needed for wheezing or shortness of breath.   albuterol-ipratropium 18-103 MCG/ACT inhaler Commonly known as: COMBIVENT Inhale 2 puffs into the lungs every 8 (eight) hours as needed for wheezing.   aspirin EC 81 MG tablet Take 81 mg by mouth daily.   Biofreeze 4 % Gel Generic drug: Menthol (Topical Analgesic) Apply 1 application topically daily as needed (pain).   Botox  100 units Solr injection Generic drug: botulinum toxin Type A Inject 100 Units into the muscle every 3 (three) months.   cetirizine 10 MG tablet Commonly known as: ZYRTEC Take 10 mg by mouth daily as needed for allergies.   fluticasone 50 MCG/ACT nasal spray Commonly known as: FLONASE Place 2 sprays into both nostrils daily as needed for  allergies. SHAKE AS DIRECTED   gabapentin 100 MG capsule Commonly known as: Neurontin Take 2 capsules in am, 2 capsules midday, 1 capsule at bedtime.   ibandronate 150 MG tablet Commonly known as: BONIVA Take 150 mg by mouth every 30 (thirty) days. Take in the morning with a full glass of water, on an empty stomach, and do not take anything else by mouth or lie down for the next 30 min.   insulin glargine 100 UNIT/ML injection Commonly known as: LANTUS Inject 12 Units into the skin at bedtime. 9 pm daily   lidocaine-prilocaine cream Commonly known as: EMLA Apply 1 application topically 4 (four) times daily as needed. What changed: reasons to take this   metFORMIN 1000 MG tablet Commonly known as: GLUCOPHAGE Take 1,000 mg by mouth 2 (two) times daily with a meal.   Myrbetriq 25 MG Tb24 tablet Generic drug: mirabegron ER Take 25 mg by mouth daily.   NEXIUM PO Take 1 tablet by mouth daily.   sertraline 50 MG tablet Commonly known as: ZOLOFT Take 50 mg by mouth 2 (two) times daily.   Simbrinza 1-0.2 % Susp Generic drug: Brinzolamide-Brimonidine Apply 1 drop to eye at bedtime.   trimethoprim 100 MG tablet Commonly known as: TRIMPEX Take 1 tablet (100 mg total) by mouth daily. OK TO RESUME AFTER YOU FINISH CURRENT ANTIBIOTIC THERAPY.   vitamin B-12 1000 MCG tablet Commonly known as: CYANOCOBALAMIN Take 1,000 mcg by mouth daily.            Durable Medical Equipment  (From admission, onward)         Start     Ordered   04/22/19 1200  For home use only DME Bedside commode  Once    Question:  Patient needs a bedside commode to treat with the following condition  Answer:  Ambulatory dysfunction   04/22/19 1203          No Known Allergies   Procedures/Studies: DG CHEST PORT 1 VIEW  Result Date: 04/21/2019 CLINICAL DATA:  CRP elevated. EXAM: PORTABLE CHEST 1 VIEW COMPARISON:  April 17, 2019. FINDINGS: Stable cardiomediastinal silhouette. Atherosclerosis of  thoracic aorta is noted. Left-sided pacemaker is unchanged in position. No pneumothorax is noted. Left lung is clear. Stable right upper lobe opacity is noted concerning for possible pneumonia. Bony thorax is unremarkable. IMPRESSION: Stable right upper lobe opacity is noted concerning for possible pneumonia. Follow-up radiographs are recommended to ensure resolution. Aortic Atherosclerosis (ICD10-I70.0). Electronically Signed   By: Marijo Conception M.D.   On: 04/21/2019 10:25   DG Chest Portable 1 View  Result Date: 04/17/2019 CLINICAL DATA:  COVID positive.  Cough. EXAM: PORTABLE CHEST 1 VIEW COMPARISON:  02/24/2015. FINDINGS: Mediastinum and hilar structures normal. Cardiac pacer with lead tip over the right atrium and right ventricle. Sliding hiatal hernia. Right upper lobe infiltrate noted today's exam. Mild bibasilar atelectasis and or scarring. Calcified nodule left base consistent with calcified granuloma. Elevation right hemidiaphragm. No pleural effusion or pneumothorax. IMPRESSION: Right upper lobe infiltrate consistent with pneumonia. Electronically Signed   By: [place]   On: 04/17/2019 13:17   VAS [place]  LOWER EXTREMITY VENOUS (DVT)  Result Date: 04/20/2019  Lower Venous DVT Study Indications: Chronic thigh pain, Covid-19.  Comparison Study: No prior study on file for comparison Performing Technologist: Sharion Dove RVS  Examination Guidelines: A complete evaluation includes B-mode imaging, spectral Doppler, color Doppler, and power Doppler as needed of all accessible portions of each vessel. Bilateral testing is considered an integral part of a complete examination. Limited examinations for reoccurring indications may be performed as noted. The reflux portion of the exam is performed with the patient in reverse Trendelenburg.  +---------+---------------+---------+-----------+----------+--------------+ RIGHT    CompressibilityPhasicitySpontaneityPropertiesThrombus Aging  +---------+---------------+---------+-----------+----------+--------------+ CFV      Full           Yes      Yes                                 +---------+---------------+---------+-----------+----------+--------------+ SFJ      Full                                                        +---------+---------------+---------+-----------+----------+--------------+ FV Prox  Full                                                        +---------+---------------+---------+-----------+----------+--------------+ FV Mid   Full                                                        +---------+---------------+---------+-----------+----------+--------------+ FV DistalFull                                                        +---------+---------------+---------+-----------+----------+--------------+ PFV      Full                                                        +---------+---------------+---------+-----------+----------+--------------+ POP      Full           Yes      Yes                                 +---------+---------------+---------+-----------+----------+--------------+ PTV      Full                                                        +---------+---------------+---------+-----------+----------+--------------+ PERO     Full                                                        +---------+---------------+---------+-----------+----------+--------------+  Gastroc  Full                                                        +---------+---------------+---------+-----------+----------+--------------+   +---------+---------------+---------+-----------+----------+-------------------+ LEFT     CompressibilityPhasicitySpontaneityPropertiesThrombus Aging      +---------+---------------+---------+-----------+----------+-------------------+ CFV      Full           Yes      Yes                                       +---------+---------------+---------+-----------+----------+-------------------+ SFJ      Full                                                             +---------+---------------+---------+-----------+----------+-------------------+ FV Prox  Full                                                             +---------+---------------+---------+-----------+----------+-------------------+ FV Mid   Full                                                             +---------+---------------+---------+-----------+----------+-------------------+ FV DistalFull                                                             +---------+---------------+---------+-----------+----------+-------------------+ PFV      Full                                                             +---------+---------------+---------+-----------+----------+-------------------+ POP                     Yes      Yes                  patent by color and                                                       Doppler             +---------+---------------+---------+-----------+----------+-------------------+ PTV      Full                                                             +---------+---------------+---------+-----------+----------+-------------------+  PERO     Full                                                             +---------+---------------+---------+-----------+----------+-------------------+     Summary: BILATERAL: - No evidence of deep vein thrombosis seen in the lower extremities, bilaterally.   *See table(s) above for measurements and observations. Electronically signed by Monica Martinez MD on 04/20/2019 at 9:23:49 AM.    Final     Subjective: No acute issues or events overnight, patient excited for discharge back home so she can see her husband, otherwise denies chest pain, shortness of breath, nausea, vomiting, diarrhea, constipation, headache, fevers,  chills.   Discharge Exam: Vitals:   04/23/19 0600 04/23/19 0723  BP:  (!) 108/53  Pulse:  74  Resp:  20  Temp:  98.4 F (36.9 C)  SpO2: 95% 96%   Vitals:   04/23/19 0330 04/23/19 0400 04/23/19 0600 04/23/19 0723  BP:  (!) 113/46  (!) 108/53  Pulse: 79   74  Resp: 20   20  Temp:  97.9 F (36.6 C)  98.4 F (36.9 C)  TempSrc:  Axillary  Oral  SpO2: 93%  95% 96%  Weight:      Height:        General:  Pleasantly resting in bed, No acute distress. HEENT:  Normocephalic atraumatic.  Sclerae nonicteric, noninjected.  Extraocular movements intact bilaterally. Neck:  Without mass or deformity.  Trachea is midline. Lungs:  Clear to auscultate bilaterally without rhonchi, wheeze, or rales. Heart:  Regular rate and rhythm.  Without murmurs, rubs, or gallops. Abdomen:  Soft, nontender, nondistended.  Without guarding or rebound. Extremities: Without cyanosis, clubbing, edema, or obvious deformity. Vascular:  Dorsalis pedis and posterior tibial pulses palpable bilaterally. Skin:  Warm and dry, no erythema, no ulcerations.   The results of significant diagnostics from this hospitalization (including imaging, microbiology, ancillary and laboratory) are listed below for reference.     Microbiology: Recent Results (from the past 240 hour(s))  Urine culture     Status: None   Collection Time: 04/17/19  4:43 PM   Specimen: Urine, Random  Result Value Ref Range Status   Specimen Description   Final    URINE, RANDOM Performed at [facility] [address], [place], [place] [address]    Special Requests   Final    NONE Performed at [facility], [place] [address] [place], [place] [address]    Culture   Final    NO GROWTH Performed at [facility], [place] [address] [place], [place] [address]    Report Status 04/18/2019 FINAL  Final  Blood culture (routine x 2)     Status: None   Collection Time: 04/17/19  5:01 PM   Specimen: BLOOD   Result Value Ref Range Status   Specimen Description BLOOD RIGHT ANTECUBITAL  Final   Special Requests   Final    BOTTLES DRAWN AEROBIC AND ANAEROBIC Blood Culture results may not be optimal due to an excessive volume of blood received in culture bottles Performed at [facility], [place] [address], [place], [place] [address]    Culture   Final    NO GROWTH 5 DAYS Performed at [facility], [place]  [address] [place], [place] [address]    Report Status 04/22/2019 FINAL  Final  MRSA PCR Screening     Status: None   Collection Time: 04/18/19  1:23 AM   Specimen: Nasopharyngeal  Result Value Ref Range Status   MRSA by PCR NEGATIVE NEGATIVE Final    Comment:        The GeneXpert MRSA Assay (FDA approved for NASAL specimens only), is one component of a comprehensive MRSA colonization surveillance program. It is not intended to diagnose MRSA infection nor to guide or monitor treatment for MRSA infections. Performed at [facility], [place] [address] [place], [place] [address]      Labs: BNP (last 3 results) Recent Labs    04/18/19 0630  BNP 567.2*   Basic Metabolic Panel: Recent Labs  Lab 04/18/19 0630 04/19/19 0333 04/20/19 0331 04/21/19 0417 04/22/19 0316  NA 137 140 136 136 139  K 4.2 4.4 4.1 4.1 4.3  CL 99 102 100 100 102  CO2 22 28 27 27 27   GLUCOSE 144* 146* 199* 250* 237*  BUN 20 26* 25* 29* 32*  CREATININE 0.73 0.74 0.67 0.65 0.71  CALCIUM 8.3* 9.3 8.9 9.1 8.9   Liver Function Tests: Recent Labs  Lab 04/18/19 0630 04/19/19 0333 04/20/19 0331 04/21/19 0417 04/22/19 0316  AST 14* 11* 12* 9* 13*  ALT 13 13 12 12 12   ALKPHOS 63 67 63 66 68  BILITOT 0.5 0.5 0.5 0.5 0.6  PROT 6.1* 6.3* 5.6* 5.7* 5.7*  ALBUMIN 2.9* 3.0* 2.6* 2.5* 2.4*   Recent Labs  Lab 04/17/19 1321  LIPASE 18   No results for input(s): AMMONIA in the last 168 hours. CBC: Recent Labs  Lab 04/17/19 1321 04/17/19 1321 04/18/19 0630  04/19/19 0333 04/20/19 0331 04/21/19 0417 04/22/19 0316  WBC 9.9   < > 9.4 10.3 10.8* 10.2 8.1  NEUTROABS 7.3  --   --   --   --   --   --   HGB 12.0   < > 11.5* 11.3* 10.5* 10.1* 10.1*  HCT 36.6   < > 36.0 35.9* 32.8* 31.7* 32.6*  MCV 87.6   < > 88.5 89.3 88.4 88.1 88.8  PLT 373   < > 358 399 335 357 427*   < > = values in this interval not displayed.   Cardiac Enzymes: No results for input(s): CKTOTAL, CKMB, CKMBINDEX, TROPONINI in the last 168 hours. BNP: Invalid input(s): POCBNP CBG: Recent Labs  Lab 04/22/19 0718 04/22/19 1143 04/22/19 1612 04/22/19 2042 04/23/19 0729  GLUCAP 224* 212* 198* 106* 213*   D-Dimer Recent Labs    04/21/19 0417 04/22/19 0316  DDIMER 1.19* 1.05*   Hgb A1c No results for input(s): HGBA1C in the last 72 hours. Lipid Profile No results for input(s): CHOL, HDL, LDLCALC, TRIG, CHOLHDL, LDLDIRECT in the last 72 hours. Thyroid function studies No results for input(s): TSH, T4TOTAL, T3FREE, THYROIDAB in the last 72 hours.  Invalid input(s): FREET3 Anemia work up No results for input(s): VITAMINB12, FOLATE, FERRITIN, TIBC, IRON, RETICCTPCT in the last 72 hours. Urinalysis    Component Value Date/Time   COLORURINE YELLOW 04/17/2019 1643   APPEARANCEUR CLEAR 04/17/2019 1643   LABSPEC 1.020 04/17/2019 1643   PHURINE 5.0 04/17/2019 1643   GLUCOSEU NEGATIVE 04/17/2019 1643   HGBUR NEGATIVE 04/17/2019 1643   BILIRUBINUR NEGATIVE 04/17/2019 1643   KETONESUR NEGATIVE 04/17/2019 1643   PROTEINUR NEGATIVE 04/17/2019 1643   UROBILINOGEN 0.2 09/11/2012 0927   NITRITE  NEGATIVE 04/17/2019 1643   LEUKOCYTESUR NEGATIVE 04/17/2019 1643   Sepsis Labs Invalid input(s): PROCALCITONIN,  WBC,  LACTICIDVEN Microbiology Recent Results (from the past 240 hour(s))  Urine culture     Status: None   Collection Time: 04/17/19  4:43 PM   Specimen: Urine, Random  Result Value Ref Range Status   Specimen Description   Final    URINE, RANDOM Performed at  [facility] [address], [place], [place] [address]    Special Requests   Final    NONE Performed at [facility], [place] [address], [place], [place] [address]    Culture   Final    NO GROWTH Performed at [facility], [place] [address], [place], [place] [address]    Report Status 04/18/2019 FINAL  Final  Blood culture (routine x 2)     Status: None   Collection Time: 04/17/19  5:01 PM   Specimen: BLOOD  Result Value Ref Range Status   Specimen Description BLOOD RIGHT ANTECUBITAL  Final   Special Requests   Final    BOTTLES DRAWN AEROBIC AND ANAEROBIC Blood Culture results may not be optimal due to an excessive volume of blood received in culture bottles Performed at [facility], [place] [address], [place], [place] [address]    Culture   Final    NO GROWTH 5 DAYS Performed at [facility], [place] [address] [place], [place] [address]    Report Status 04/22/2019 FINAL  Final  MRSA PCR Screening     Status: None   Collection Time: 04/18/19  1:23 AM   Specimen: Nasopharyngeal  Result Value Ref Range Status   MRSA by PCR NEGATIVE NEGATIVE Final    Comment:        The GeneXpert MRSA Assay (FDA approved for NASAL specimens only), is one component of a comprehensive MRSA colonization surveillance program. It is not intended to diagnose MRSA infection nor to guide or monitor treatment for MRSA infections. Performed at [facility], [place] [address] [place], [place] [address]      Time coordinating discharge: Over 30 minutes  SIGNED:   Little Ishikawa, DO [facility] 04/23/2019, 11:34 AM Pager   If 7PM-7AM, please contact night-coverage [email or web address]

## 2019-04-23 NOTE — Progress Notes (Signed)
Pt sleeping well this PM. Arousable. Noted positive results from gabapentin this evening.  Adult briefs for pm shift, as per P.T. to help with incontinencne and standing. Purewick patent. Pt encouraged to reposition every 2 hours, and this RN added help when neeeded.

## 2019-04-23 NOTE — Care Management Important Message (Signed)
Important Message  Patient Details  Name: Wendy Velasquez MRN: [id number] Date of Birth: [birth year]   Medicare Important Message Given:  Yes - Important Message mailed due to current National Emergency  Verbal consent obtained due to current National Emergency  Relationship to patient: Child Contact Name: Pershing Cox Call Date: 04/23/19  Time: 1148 Phone: [phone] Outcome: Spoke with contact Important Message mailed to: Emergency contact on file    Delorse Lek 04/23/2019, 11:48 AM

## 2019-04-24 DIAGNOSIS — M6281 Muscle weakness (generalized): Secondary | ICD-10-CM | POA: Diagnosis not present

## 2019-04-24 DIAGNOSIS — R2681 Unsteadiness on feet: Secondary | ICD-10-CM | POA: Diagnosis not present

## 2019-04-24 DIAGNOSIS — R262 Difficulty in walking, not elsewhere classified: Secondary | ICD-10-CM | POA: Diagnosis not present

## 2019-04-24 DIAGNOSIS — M25561 Pain in right knee: Secondary | ICD-10-CM | POA: Diagnosis not present

## 2019-04-24 DIAGNOSIS — E1142 Type 2 diabetes mellitus with diabetic polyneuropathy: Secondary | ICD-10-CM | POA: Diagnosis not present

## 2019-04-27 DIAGNOSIS — I129 Hypertensive chronic kidney disease with stage 1 through stage 4 chronic kidney disease, or unspecified chronic kidney disease: Secondary | ICD-10-CM | POA: Diagnosis not present

## 2019-04-27 DIAGNOSIS — R2689 Other abnormalities of gait and mobility: Secondary | ICD-10-CM | POA: Diagnosis not present

## 2019-04-27 DIAGNOSIS — M25561 Pain in right knee: Secondary | ICD-10-CM | POA: Diagnosis not present

## 2019-04-27 DIAGNOSIS — M4302 Spondylolysis, cervical region: Secondary | ICD-10-CM | POA: Diagnosis not present

## 2019-04-27 DIAGNOSIS — R5381 Other malaise: Secondary | ICD-10-CM | POA: Diagnosis not present

## 2019-04-27 DIAGNOSIS — R2681 Unsteadiness on feet: Secondary | ICD-10-CM | POA: Diagnosis not present

## 2019-04-27 DIAGNOSIS — J4550 Severe persistent asthma, uncomplicated: Secondary | ICD-10-CM | POA: Diagnosis not present

## 2019-04-27 DIAGNOSIS — N1831 Chronic kidney disease, stage 3a: Secondary | ICD-10-CM | POA: Diagnosis not present

## 2019-04-27 DIAGNOSIS — E1129 Type 2 diabetes mellitus with other diabetic kidney complication: Secondary | ICD-10-CM | POA: Diagnosis not present

## 2019-04-27 DIAGNOSIS — M6281 Muscle weakness (generalized): Secondary | ICD-10-CM | POA: Diagnosis not present

## 2019-04-27 DIAGNOSIS — U071 COVID-19: Secondary | ICD-10-CM | POA: Diagnosis not present

## 2019-04-27 DIAGNOSIS — E1142 Type 2 diabetes mellitus with diabetic polyneuropathy: Secondary | ICD-10-CM | POA: Diagnosis not present

## 2019-04-27 DIAGNOSIS — R262 Difficulty in walking, not elsewhere classified: Secondary | ICD-10-CM | POA: Diagnosis not present

## 2019-04-28 DIAGNOSIS — R2681 Unsteadiness on feet: Secondary | ICD-10-CM | POA: Diagnosis not present

## 2019-04-28 DIAGNOSIS — M6281 Muscle weakness (generalized): Secondary | ICD-10-CM | POA: Diagnosis not present

## 2019-04-28 DIAGNOSIS — M25561 Pain in right knee: Secondary | ICD-10-CM | POA: Diagnosis not present

## 2019-04-28 DIAGNOSIS — E1142 Type 2 diabetes mellitus with diabetic polyneuropathy: Secondary | ICD-10-CM | POA: Diagnosis not present

## 2019-04-28 DIAGNOSIS — R262 Difficulty in walking, not elsewhere classified: Secondary | ICD-10-CM | POA: Diagnosis not present

## 2019-04-29 DIAGNOSIS — R262 Difficulty in walking, not elsewhere classified: Secondary | ICD-10-CM | POA: Diagnosis not present

## 2019-04-29 DIAGNOSIS — E1142 Type 2 diabetes mellitus with diabetic polyneuropathy: Secondary | ICD-10-CM | POA: Diagnosis not present

## 2019-04-29 DIAGNOSIS — M25561 Pain in right knee: Secondary | ICD-10-CM | POA: Diagnosis not present

## 2019-04-29 DIAGNOSIS — M6281 Muscle weakness (generalized): Secondary | ICD-10-CM | POA: Diagnosis not present

## 2019-04-29 DIAGNOSIS — R2681 Unsteadiness on feet: Secondary | ICD-10-CM | POA: Diagnosis not present

## 2019-05-01 DIAGNOSIS — M1711 Unilateral primary osteoarthritis, right knee: Secondary | ICD-10-CM | POA: Diagnosis not present

## 2019-05-01 DIAGNOSIS — M25561 Pain in right knee: Secondary | ICD-10-CM | POA: Diagnosis not present

## 2019-05-04 DIAGNOSIS — R262 Difficulty in walking, not elsewhere classified: Secondary | ICD-10-CM | POA: Diagnosis not present

## 2019-05-04 DIAGNOSIS — R2681 Unsteadiness on feet: Secondary | ICD-10-CM | POA: Diagnosis not present

## 2019-05-04 DIAGNOSIS — E1142 Type 2 diabetes mellitus with diabetic polyneuropathy: Secondary | ICD-10-CM | POA: Diagnosis not present

## 2019-05-04 DIAGNOSIS — M6281 Muscle weakness (generalized): Secondary | ICD-10-CM | POA: Diagnosis not present

## 2019-05-04 DIAGNOSIS — M25561 Pain in right knee: Secondary | ICD-10-CM | POA: Diagnosis not present

## 2019-05-05 DIAGNOSIS — M25561 Pain in right knee: Secondary | ICD-10-CM | POA: Diagnosis not present

## 2019-05-05 DIAGNOSIS — E1142 Type 2 diabetes mellitus with diabetic polyneuropathy: Secondary | ICD-10-CM | POA: Diagnosis not present

## 2019-05-05 DIAGNOSIS — R2681 Unsteadiness on feet: Secondary | ICD-10-CM | POA: Diagnosis not present

## 2019-05-05 DIAGNOSIS — M6281 Muscle weakness (generalized): Secondary | ICD-10-CM | POA: Diagnosis not present

## 2019-05-05 DIAGNOSIS — R262 Difficulty in walking, not elsewhere classified: Secondary | ICD-10-CM | POA: Diagnosis not present

## 2019-05-06 DIAGNOSIS — E1142 Type 2 diabetes mellitus with diabetic polyneuropathy: Secondary | ICD-10-CM | POA: Diagnosis not present

## 2019-05-06 DIAGNOSIS — R262 Difficulty in walking, not elsewhere classified: Secondary | ICD-10-CM | POA: Diagnosis not present

## 2019-05-06 DIAGNOSIS — R2681 Unsteadiness on feet: Secondary | ICD-10-CM | POA: Diagnosis not present

## 2019-05-06 DIAGNOSIS — M6281 Muscle weakness (generalized): Secondary | ICD-10-CM | POA: Diagnosis not present

## 2019-05-06 DIAGNOSIS — M25561 Pain in right knee: Secondary | ICD-10-CM | POA: Diagnosis not present

## 2019-05-07 DIAGNOSIS — M6281 Muscle weakness (generalized): Secondary | ICD-10-CM | POA: Diagnosis not present

## 2019-05-07 DIAGNOSIS — E1142 Type 2 diabetes mellitus with diabetic polyneuropathy: Secondary | ICD-10-CM | POA: Diagnosis not present

## 2019-05-07 DIAGNOSIS — R262 Difficulty in walking, not elsewhere classified: Secondary | ICD-10-CM | POA: Diagnosis not present

## 2019-05-07 DIAGNOSIS — R2681 Unsteadiness on feet: Secondary | ICD-10-CM | POA: Diagnosis not present

## 2019-05-07 DIAGNOSIS — M25561 Pain in right knee: Secondary | ICD-10-CM | POA: Diagnosis not present

## 2019-05-08 DIAGNOSIS — M6281 Muscle weakness (generalized): Secondary | ICD-10-CM | POA: Diagnosis not present

## 2019-05-08 DIAGNOSIS — E1142 Type 2 diabetes mellitus with diabetic polyneuropathy: Secondary | ICD-10-CM | POA: Diagnosis not present

## 2019-05-08 DIAGNOSIS — M25561 Pain in right knee: Secondary | ICD-10-CM | POA: Diagnosis not present

## 2019-05-08 DIAGNOSIS — R2681 Unsteadiness on feet: Secondary | ICD-10-CM | POA: Diagnosis not present

## 2019-05-08 DIAGNOSIS — R262 Difficulty in walking, not elsewhere classified: Secondary | ICD-10-CM | POA: Diagnosis not present

## 2019-05-11 ENCOUNTER — Ambulatory Visit (INDEPENDENT_AMBULATORY_CARE_PROVIDER_SITE_OTHER): Payer: Medicare Other | Admitting: *Deleted

## 2019-05-11 DIAGNOSIS — I442 Atrioventricular block, complete: Secondary | ICD-10-CM | POA: Diagnosis not present

## 2019-05-11 DIAGNOSIS — M6281 Muscle weakness (generalized): Secondary | ICD-10-CM | POA: Diagnosis not present

## 2019-05-11 DIAGNOSIS — E1142 Type 2 diabetes mellitus with diabetic polyneuropathy: Secondary | ICD-10-CM | POA: Diagnosis not present

## 2019-05-11 DIAGNOSIS — M25561 Pain in right knee: Secondary | ICD-10-CM | POA: Diagnosis not present

## 2019-05-11 DIAGNOSIS — R262 Difficulty in walking, not elsewhere classified: Secondary | ICD-10-CM | POA: Diagnosis not present

## 2019-05-11 DIAGNOSIS — R2681 Unsteadiness on feet: Secondary | ICD-10-CM | POA: Diagnosis not present

## 2019-05-12 DIAGNOSIS — R2681 Unsteadiness on feet: Secondary | ICD-10-CM | POA: Diagnosis not present

## 2019-05-12 DIAGNOSIS — M25561 Pain in right knee: Secondary | ICD-10-CM | POA: Diagnosis not present

## 2019-05-12 DIAGNOSIS — R262 Difficulty in walking, not elsewhere classified: Secondary | ICD-10-CM | POA: Diagnosis not present

## 2019-05-12 DIAGNOSIS — E1142 Type 2 diabetes mellitus with diabetic polyneuropathy: Secondary | ICD-10-CM | POA: Diagnosis not present

## 2019-05-12 DIAGNOSIS — M6281 Muscle weakness (generalized): Secondary | ICD-10-CM | POA: Diagnosis not present

## 2019-05-13 DIAGNOSIS — R262 Difficulty in walking, not elsewhere classified: Secondary | ICD-10-CM | POA: Diagnosis not present

## 2019-05-13 DIAGNOSIS — M25561 Pain in right knee: Secondary | ICD-10-CM | POA: Diagnosis not present

## 2019-05-13 DIAGNOSIS — E1142 Type 2 diabetes mellitus with diabetic polyneuropathy: Secondary | ICD-10-CM | POA: Diagnosis not present

## 2019-05-13 DIAGNOSIS — R2681 Unsteadiness on feet: Secondary | ICD-10-CM | POA: Diagnosis not present

## 2019-05-13 DIAGNOSIS — M6281 Muscle weakness (generalized): Secondary | ICD-10-CM | POA: Diagnosis not present

## 2019-05-14 DIAGNOSIS — R262 Difficulty in walking, not elsewhere classified: Secondary | ICD-10-CM | POA: Diagnosis not present

## 2019-05-14 DIAGNOSIS — M25561 Pain in right knee: Secondary | ICD-10-CM | POA: Diagnosis not present

## 2019-05-14 DIAGNOSIS — R2681 Unsteadiness on feet: Secondary | ICD-10-CM | POA: Diagnosis not present

## 2019-05-14 DIAGNOSIS — M6281 Muscle weakness (generalized): Secondary | ICD-10-CM | POA: Diagnosis not present

## 2019-05-14 DIAGNOSIS — E1142 Type 2 diabetes mellitus with diabetic polyneuropathy: Secondary | ICD-10-CM | POA: Diagnosis not present

## 2019-05-15 DIAGNOSIS — R2681 Unsteadiness on feet: Secondary | ICD-10-CM | POA: Diagnosis not present

## 2019-05-15 DIAGNOSIS — M6281 Muscle weakness (generalized): Secondary | ICD-10-CM | POA: Diagnosis not present

## 2019-05-15 DIAGNOSIS — E1142 Type 2 diabetes mellitus with diabetic polyneuropathy: Secondary | ICD-10-CM | POA: Diagnosis not present

## 2019-05-15 DIAGNOSIS — R262 Difficulty in walking, not elsewhere classified: Secondary | ICD-10-CM | POA: Diagnosis not present

## 2019-05-15 DIAGNOSIS — M25561 Pain in right knee: Secondary | ICD-10-CM | POA: Diagnosis not present

## 2019-05-16 LAB — CUP PACEART REMOTE DEVICE CHECK
Battery Remaining Longevity: 67 mo
Battery Remaining Percentage: 56 %
Battery Voltage: 2.89 V
Brady Statistic AP VP Percent: 1 %
Brady Statistic AP VS Percent: 1 %
Brady Statistic AS VP Percent: 99 %
Brady Statistic AS VS Percent: 1 %
Brady Statistic RA Percent Paced: 1 %
Brady Statistic RV Percent Paced: 99 %
Date Time Interrogation Session: 20210301020021
Implantable Lead Implant Date: 20140703
Implantable Lead Implant Date: 20140703
Implantable Lead Location: 753859
Implantable Lead Location: 753860
Implantable Pulse Generator Implant Date: 20140703
Lead Channel Impedance Value: 440 Ohm
Lead Channel Impedance Value: 480 Ohm
Lead Channel Pacing Threshold Amplitude: 0.5 V
Lead Channel Pacing Threshold Amplitude: 1.125 V
Lead Channel Pacing Threshold Pulse Width: 0.4 ms
Lead Channel Pacing Threshold Pulse Width: 0.4 ms
Lead Channel Sensing Intrinsic Amplitude: 12 mV
Lead Channel Sensing Intrinsic Amplitude: 2.1 mV
Lead Channel Setting Pacing Amplitude: 1.375
Lead Channel Setting Pacing Amplitude: 2 V
Lead Channel Setting Pacing Pulse Width: 0.4 ms
Lead Channel Setting Sensing Sensitivity: 12.5 mV
Pulse Gen Model: 2240
Pulse Gen Serial Number: 7522008

## 2019-05-17 DIAGNOSIS — R2681 Unsteadiness on feet: Secondary | ICD-10-CM | POA: Diagnosis not present

## 2019-05-17 DIAGNOSIS — M25561 Pain in right knee: Secondary | ICD-10-CM | POA: Diagnosis not present

## 2019-05-17 DIAGNOSIS — R262 Difficulty in walking, not elsewhere classified: Secondary | ICD-10-CM | POA: Diagnosis not present

## 2019-05-17 DIAGNOSIS — E1142 Type 2 diabetes mellitus with diabetic polyneuropathy: Secondary | ICD-10-CM | POA: Diagnosis not present

## 2019-05-17 DIAGNOSIS — M6281 Muscle weakness (generalized): Secondary | ICD-10-CM | POA: Diagnosis not present

## 2019-05-18 DIAGNOSIS — R2681 Unsteadiness on feet: Secondary | ICD-10-CM | POA: Diagnosis not present

## 2019-05-18 DIAGNOSIS — R262 Difficulty in walking, not elsewhere classified: Secondary | ICD-10-CM | POA: Diagnosis not present

## 2019-05-18 DIAGNOSIS — M25561 Pain in right knee: Secondary | ICD-10-CM | POA: Diagnosis not present

## 2019-05-18 DIAGNOSIS — E1142 Type 2 diabetes mellitus with diabetic polyneuropathy: Secondary | ICD-10-CM | POA: Diagnosis not present

## 2019-05-18 DIAGNOSIS — M6281 Muscle weakness (generalized): Secondary | ICD-10-CM | POA: Diagnosis not present

## 2019-05-18 NOTE — Progress Notes (Signed)
PPM Remote  

## 2019-05-19 ENCOUNTER — Telehealth: Payer: Self-pay | Admitting: *Deleted

## 2019-05-19 DIAGNOSIS — R2681 Unsteadiness on feet: Secondary | ICD-10-CM | POA: Diagnosis not present

## 2019-05-19 DIAGNOSIS — R262 Difficulty in walking, not elsewhere classified: Secondary | ICD-10-CM | POA: Diagnosis not present

## 2019-05-19 DIAGNOSIS — M25561 Pain in right knee: Secondary | ICD-10-CM | POA: Diagnosis not present

## 2019-05-19 DIAGNOSIS — M6281 Muscle weakness (generalized): Secondary | ICD-10-CM | POA: Diagnosis not present

## 2019-05-19 DIAGNOSIS — E1142 Type 2 diabetes mellitus with diabetic polyneuropathy: Secondary | ICD-10-CM | POA: Diagnosis not present

## 2019-05-19 NOTE — Telephone Encounter (Signed)
I called patients insurance company and spoke to [place].  He states that [id number] and [phone] is covered and no precert is required.  Reference number for call is [phone].

## 2019-05-20 DIAGNOSIS — E1142 Type 2 diabetes mellitus with diabetic polyneuropathy: Secondary | ICD-10-CM | POA: Diagnosis not present

## 2019-05-20 DIAGNOSIS — M25561 Pain in right knee: Secondary | ICD-10-CM | POA: Diagnosis not present

## 2019-05-20 DIAGNOSIS — M6281 Muscle weakness (generalized): Secondary | ICD-10-CM | POA: Diagnosis not present

## 2019-05-20 DIAGNOSIS — R262 Difficulty in walking, not elsewhere classified: Secondary | ICD-10-CM | POA: Diagnosis not present

## 2019-05-20 DIAGNOSIS — R2681 Unsteadiness on feet: Secondary | ICD-10-CM | POA: Diagnosis not present

## 2019-05-21 DIAGNOSIS — M6281 Muscle weakness (generalized): Secondary | ICD-10-CM | POA: Diagnosis not present

## 2019-05-21 DIAGNOSIS — M25561 Pain in right knee: Secondary | ICD-10-CM | POA: Diagnosis not present

## 2019-05-21 DIAGNOSIS — E1142 Type 2 diabetes mellitus with diabetic polyneuropathy: Secondary | ICD-10-CM | POA: Diagnosis not present

## 2019-05-21 DIAGNOSIS — R262 Difficulty in walking, not elsewhere classified: Secondary | ICD-10-CM | POA: Diagnosis not present

## 2019-05-21 DIAGNOSIS — R2681 Unsteadiness on feet: Secondary | ICD-10-CM | POA: Diagnosis not present

## 2019-05-22 DIAGNOSIS — E1142 Type 2 diabetes mellitus with diabetic polyneuropathy: Secondary | ICD-10-CM | POA: Diagnosis not present

## 2019-05-22 DIAGNOSIS — M6281 Muscle weakness (generalized): Secondary | ICD-10-CM | POA: Diagnosis not present

## 2019-05-22 DIAGNOSIS — M25561 Pain in right knee: Secondary | ICD-10-CM | POA: Diagnosis not present

## 2019-05-22 DIAGNOSIS — R2681 Unsteadiness on feet: Secondary | ICD-10-CM | POA: Diagnosis not present

## 2019-05-22 DIAGNOSIS — R262 Difficulty in walking, not elsewhere classified: Secondary | ICD-10-CM | POA: Diagnosis not present

## 2019-05-25 DIAGNOSIS — M25561 Pain in right knee: Secondary | ICD-10-CM | POA: Diagnosis not present

## 2019-05-25 DIAGNOSIS — R2681 Unsteadiness on feet: Secondary | ICD-10-CM | POA: Diagnosis not present

## 2019-05-25 DIAGNOSIS — E1142 Type 2 diabetes mellitus with diabetic polyneuropathy: Secondary | ICD-10-CM | POA: Diagnosis not present

## 2019-05-25 DIAGNOSIS — M6281 Muscle weakness (generalized): Secondary | ICD-10-CM | POA: Diagnosis not present

## 2019-05-25 DIAGNOSIS — R262 Difficulty in walking, not elsewhere classified: Secondary | ICD-10-CM | POA: Diagnosis not present

## 2019-05-26 DIAGNOSIS — M25561 Pain in right knee: Secondary | ICD-10-CM | POA: Diagnosis not present

## 2019-05-26 DIAGNOSIS — M6281 Muscle weakness (generalized): Secondary | ICD-10-CM | POA: Diagnosis not present

## 2019-05-26 DIAGNOSIS — R2681 Unsteadiness on feet: Secondary | ICD-10-CM | POA: Diagnosis not present

## 2019-05-26 DIAGNOSIS — R262 Difficulty in walking, not elsewhere classified: Secondary | ICD-10-CM | POA: Diagnosis not present

## 2019-05-26 DIAGNOSIS — E1142 Type 2 diabetes mellitus with diabetic polyneuropathy: Secondary | ICD-10-CM | POA: Diagnosis not present

## 2019-05-27 DIAGNOSIS — M25561 Pain in right knee: Secondary | ICD-10-CM | POA: Diagnosis not present

## 2019-05-27 DIAGNOSIS — M6281 Muscle weakness (generalized): Secondary | ICD-10-CM | POA: Diagnosis not present

## 2019-05-27 DIAGNOSIS — E1142 Type 2 diabetes mellitus with diabetic polyneuropathy: Secondary | ICD-10-CM | POA: Diagnosis not present

## 2019-05-27 DIAGNOSIS — R262 Difficulty in walking, not elsewhere classified: Secondary | ICD-10-CM | POA: Diagnosis not present

## 2019-05-27 DIAGNOSIS — R2681 Unsteadiness on feet: Secondary | ICD-10-CM | POA: Diagnosis not present

## 2019-05-28 DIAGNOSIS — R2681 Unsteadiness on feet: Secondary | ICD-10-CM | POA: Diagnosis not present

## 2019-05-28 DIAGNOSIS — M6281 Muscle weakness (generalized): Secondary | ICD-10-CM | POA: Diagnosis not present

## 2019-05-28 DIAGNOSIS — R262 Difficulty in walking, not elsewhere classified: Secondary | ICD-10-CM | POA: Diagnosis not present

## 2019-05-28 DIAGNOSIS — M25561 Pain in right knee: Secondary | ICD-10-CM | POA: Diagnosis not present

## 2019-05-28 DIAGNOSIS — E1142 Type 2 diabetes mellitus with diabetic polyneuropathy: Secondary | ICD-10-CM | POA: Diagnosis not present

## 2019-06-01 ENCOUNTER — Other Ambulatory Visit: Payer: Self-pay

## 2019-06-01 ENCOUNTER — Ambulatory Visit (INDEPENDENT_AMBULATORY_CARE_PROVIDER_SITE_OTHER): Payer: Medicare Other | Admitting: Neurology

## 2019-06-01 ENCOUNTER — Encounter: Payer: Self-pay | Admitting: Neurology

## 2019-06-01 VITALS — BP 119/70 | HR 84 | Temp 97.0°F

## 2019-06-01 DIAGNOSIS — G243 Spasmodic torticollis: Secondary | ICD-10-CM | POA: Diagnosis not present

## 2019-06-01 DIAGNOSIS — M542 Cervicalgia: Secondary | ICD-10-CM

## 2019-06-01 MED ORDER — ONABOTULINUMTOXINA 100 UNITS IJ SOLR
100.0000 [IU] | Freq: Once | INTRAMUSCULAR | Status: AC
  Administered 2019-06-01: 100 [IU] via INTRAMUSCULAR

## 2019-06-01 NOTE — Progress Notes (Signed)
PATIENT: Wendy Velasquez DOB: Aug 06, 1925  Chief Complaint  Patient presents with  . Cervical Dystonia    Botox 100 units x 1 vial - office supply     HISTORICAL  Wendy Velasquez is a 86 years old female, seen in request by her primary care physician Dr. Osborne Casco, Frans[place] Him for evaluation of neck pain, headaches, initial evaluation was on December 05, 2017.  She is accompanied by her daughter at today's clinical visit.  I have reviewed and summarized the referring note from the referring physician, she has past medical history of diabetes, use insulin, history of cardiac block, status post pacemaker, hyperlipidemia,  She had a history of chronic neck pain, had acute worsening since March 2018, she woke up one morning, noticed upper neck throbbing pain, radiating pain to her skull, has been persistent since then, over the last 1 year, she was seen by orthopedic PA, Ronette Deter, was treated with multiple trigger point injection, with mixture of 1% lidocaine, and Depo-Medrol, most recent injection was on October 10, 2017, on a monthly basis, patient denies significant improvement, also complains of worsening glucose control after injection,  She denies significant radiating pain to bilateral shoulder upper extremity, denies difficulty using arms, she had chronic urinary incontinence is receiving Botox injection by urologist, she contributed to her gait abnormality to her bilateral knee pain, chronic arthritis,  She now complains of difficulty turning her neck, 6 out of 10 constant deep achy pain, multiple tender spots at cervical, upper trapezius, oftentimes she has transient sharp radiating range of motion, difficulty turning her neck,  She also tried physical therapy, is doing morning neck traction regularly,  UPDATE Jan 02 2018: She continue complains of significant neck pain, 8 out of 10 sometimes, radiating pain to bilateral occipital region despite gabapentin 100 mg 5 tablets each  day, I have personally reviewed CT cervical spine on December 10, 2017, no acute abnormality, multilevel spondylolisthesis, maximum at C4-5, with associated   facet arthropathy, evidence of ankylosis, mild spinal stenosis at the C2-3, C3-4 level  UPDATE Apr 08 2018: She responded very well to her initial injection January 02, 2018, we used 100 units of Botox, a month after injection she was pain-free for 2 months, there was no significant side effect noticed, in the past 2 weeks, she noticed returning of her neck pain, centered at bilateral mastoid process, nuchal line, tenderness upon deep palpitation.  UPDATE August 12 2018: She responded very well to previous injection  UPDATE Sept 9 2020: She responded well to previous injection, we used Botox a 100 units, is take about 3 days for the benefit to kicking, lasting for 2 to 3 months, no significant side effect.  UPDATE Feb 25 2019: She responded well to previous injection  UPDATE June 01 2019: She suffered COVID-29 January 2020, required hospitalization but no intubation, she complains of increased bilateral upper back pain, neck pain.   REVIEW OF SYSTEMS: Full 14 system review of systems performed and notable only for as above. ALLERGIES: No Known Allergies  HOME MEDICATIONS: Current Outpatient Medications  Medication Sig Dispense Refill  . acetaminophen (TYLENOL) 500 MG tablet Take 1,000 mg by mouth every 6 (six) hours as needed for fever.    Marland Kitchen albuterol (PROVENTIL) (2.5 MG/3ML) 0.083% nebulizer solution Take 2.5 mg by nebulization every 6 (six) hours as needed for wheezing or shortness of breath.    Marland Kitchen albuterol-ipratropium (COMBIVENT) 18-103 MCG/ACT inhaler Inhale 2 puffs into the lungs every 8 (eight)  hours as needed for wheezing.     Marland Kitchen aspirin EC 81 MG tablet Take 81 mg by mouth daily.    . botulinum toxin Type A (BOTOX) 100 units SOLR injection Inject 100 Units into the muscle every 3 (three) months.    . Brinzolamide-Brimonidine  (SIMBRINZA) 1-0.2 % SUSP Apply 1 drop to eye at bedtime.    . cetirizine (ZYRTEC) 10 MG tablet Take 10 mg by mouth daily as needed for allergies.     . Esomeprazole Magnesium (NEXIUM PO) Take 1 tablet by mouth daily.    . fluticasone (FLONASE) 50 MCG/ACT nasal spray Place 2 sprays into both nostrils daily as needed for allergies. SHAKE AS DIRECTED     . Fluticasone-Salmeterol (ADVAIR DISKUS) 250-50 MCG/DOSE AEPB Inhale 1 puff into the lungs 2 (two) times daily.    Marland Kitchen gabapentin (NEURONTIN) 100 MG capsule Take 2 capsules in am, 2 capsules midday, 1 capsule at bedtime. 450 capsule 3  . ibandronate (BONIVA) 150 MG tablet Take 150 mg by mouth every 30 (thirty) days. Take in the morning with a full glass of water, on an empty stomach, and do not take anything else by mouth or lie down for the next 30 min.    . insulin glargine (LANTUS) 100 UNIT/ML injection Inject 12 Units into the skin at bedtime. 9 pm daily    . lidocaine-prilocaine (EMLA) cream Apply 1 application topically 4 (four) times daily as needed. (Patient taking differently: Apply 1 application topically 4 (four) times daily as needed (pain). ) 30 g 11  . Menthol, Topical Analgesic, (BIOFREEZE) 4 % GEL Apply 1 application topically daily as needed (pain).    . metFORMIN (GLUCOPHAGE) 1000 MG tablet Take 1,000 mg by mouth 2 (two) times daily with a meal.    . MYRBETRIQ 25 MG TB24 tablet Take 25 mg by mouth daily.   6  . sertraline (ZOLOFT) 50 MG tablet Take 50 mg by mouth 2 (two) times daily.    Marland Kitchen trimethoprim (TRIMPEX) 100 MG tablet Take 1 tablet (100 mg total) by mouth daily. OK TO RESUME AFTER YOU FINISH CURRENT ANTIBIOTIC THERAPY.    . vitamin B-12 (CYANOCOBALAMIN) 1000 MCG tablet Take 1,000 mcg by mouth daily.     No current facility-administered medications for this visit.    PAST MEDICAL HISTORY: Past Medical History:  Diagnosis Date  . Asthma   . Cardiac pacemaker in situ   . Complete heart block ([place])   . COPD (chronic  obstructive pulmonary disease) ([place])   . Diverticulosis of colon   . GERD (gastroesophageal reflux disease)   . Glaucoma of both eyes   . Heart disease   . History of cardiac arrest    09-11-2012--  symptomatic complete heart block -- hr 20's--  cpr, intubated and temp. pacer until  perm. pacemaker placement  . History of esophageal dilatation    for stricture 2007  . History of hiatal hernia   . History of iron deficiency anemia    hx transfusion's 15 yrs ago , approx 2001  . Hyperlipidemia   . Hypertension   . Neck pain   . Peripheral neuropathy   . Tachy-brady syndrome ([place])   . Type 2 diabetes mellitus ([place])   . Urgency incontinence    Botox injections for bladder.  . Wears glasses     PAST SURGICAL HISTORY: Past Surgical History:  Procedure Laterality Date  . ABDOMINAL HYSTERECTOMY  1976  approx   w/ unilateral salpingoophorectomy  . CATARACT  EXTRACTION W/ INTRAOCULAR LENS  IMPLANT, BILATERAL  1990  . COLONOSCOPY WITH ESOPHAGOGASTRODUODENOSCOPY (EGD)  last one 2007  . INTERSTIM IMPLANT PLACEMENT  2012  . INTERSTIM IMPLANT REMOVAL N/A 09/21/2014   Procedure: REMOVAL OF INTERSTIM IMPLANT;  Surgeon: Bjorn Loser, MD;  [facility];  Service: Urology;  Laterality: N/A;  . INTERSTIM IMPLANT REVISION N/A 09/21/2014   Procedure: Barrie Lyme STAGE ONE AND TWO;  Surgeon: Bjorn Loser, MD;  Location: [facility];  Service: Urology;  Laterality: N/A;  . LAPAROTOMY W/ UNILATERAL SALPINGOOPHORECTOMY  1960's  approx  . PERMANENT PACEMAKER INSERTION N/A 09/11/2012   Procedure: PERMANENT PACEMAKER INSERTION;  Surgeon: Evans Lance, MD;  Location: [facility] C[facility] LAB;  Service: Cardiovascular;  Laterality: N/A;  St. Jude Dual Chamber  . RECTOCELE REPAIR  1991  approx    FAMILY HISTORY: Family History  Problem Relation Age of Onset  . Other Mother        died during her childbirth  . Prostatitis Father   . Heart failure Sister   . Breast cancer  Sister   . Diabetic kidney disease Sister   . Melanoma Brother     SOCIAL HISTORY: Social History   Socioeconomic History  . Marital status: Married    Spouse name: Not on file  . Number of children: 3  . Years of education: 69  . Highest education level: High school graduate  Occupational History  . Occupation: Retired  Tobacco Use  . Smoking status: Never Smoker  . Smokeless tobacco: Never Used  Substance and Sexual Activity  . Alcohol use: No  . Drug use: No  . Sexual activity: Not Currently    Birth control/protection: None  Other Topics Concern  . Not on file  Social History Narrative   Lives with her husband in an independent living facility.   Right-handed.   1-2 cup caffeine daily.   Social Determinants of Health   Financial Resource Strain:   . Difficulty of Paying Living Expenses:   Food Insecurity:   . Worried About Charity fundraiser in the Last Year:   . Arboriculturist in the Last Year:   Transportation Needs:   . Film/video editor (Medical):   Marland Kitchen Lack of Transportation (Non-Medical):   Physical Activity:   . Days of Exercise per Week:   . Minutes of Exercise per Session:   Stress:   . Feeling of Stress :   Social Connections:   . Frequency of Communication with Friends and Family:   . Frequency of Social Gatherings with Friends and Family:   . Attends Religious Services:   . Active Member of Clubs or Organizations:   . Attends Archivist Meetings:   Marland Kitchen Marital Status:   Intimate Partner Violence:   . Fear of Current or Ex-Partner:   . Emotionally Abused:   Marland Kitchen Physically Abused:   . Sexually Abused:      PHYSICAL EXAM   Multiple posterior neck muscle spasm, especially at bilateral levator scapula, latissimus longus, scapular cervix,  DIAGNOSTIC DATA (LABS, IMAGING, TESTING) - I reviewed patient records, labs, notes, testing and imaging myself where available.   ASSESSMENT AND PLAN  HELYN OZOLINS is a 86 y.o. female      Chronic neck pain, headaches Abnormal neck posturing, mild retrocollis, left tilt, significant tenderness at bilateral lateral cervical paraspinal muscles, bilateral levator scapular  Consistent with upper cervical degenerative disc disease  CT cervical showed evidence of multilevel  spondylolisthesis, maximum at C4-5 with associated facet arthropathy, developing ankylosis  She had tried and failed multiple treatment in the past, including neck traction, heating pad, trigger point injection, gabapentin, responded very well to EMG guided Botox injection, initial injection was on January 02, 2018,  Repeat EMG guided Botox injection Used 100 units of Botox A    Right longissimus capitis 12.5 units Right splenius cervix 12.5 Right semispinalis 12.5 units Right levator scapular 12.5 units  Left longissimus capitis 25 units Left splenius cervix 12.5 units Left semispinalis 12.5 units Left levator scapular 12.5 units   Marcial Pacas, M.D. Ph.D.  [facility] [address] [place], [place] [address] Ph: [phone] Fax: [phone]

## 2019-06-01 NOTE — Progress Notes (Signed)
**  Botox 100 units x 1 vial, NDC [id number], Lot [id number], Exp 12/2021, office supply.//mck,rn**

## 2019-06-02 DIAGNOSIS — E1142 Type 2 diabetes mellitus with diabetic polyneuropathy: Secondary | ICD-10-CM | POA: Diagnosis not present

## 2019-06-02 DIAGNOSIS — R262 Difficulty in walking, not elsewhere classified: Secondary | ICD-10-CM | POA: Diagnosis not present

## 2019-06-02 DIAGNOSIS — R2681 Unsteadiness on feet: Secondary | ICD-10-CM | POA: Diagnosis not present

## 2019-06-02 DIAGNOSIS — M25561 Pain in right knee: Secondary | ICD-10-CM | POA: Diagnosis not present

## 2019-06-02 DIAGNOSIS — M6281 Muscle weakness (generalized): Secondary | ICD-10-CM | POA: Diagnosis not present

## 2019-06-03 DIAGNOSIS — M6281 Muscle weakness (generalized): Secondary | ICD-10-CM | POA: Diagnosis not present

## 2019-06-03 DIAGNOSIS — M25561 Pain in right knee: Secondary | ICD-10-CM | POA: Diagnosis not present

## 2019-06-03 DIAGNOSIS — E1142 Type 2 diabetes mellitus with diabetic polyneuropathy: Secondary | ICD-10-CM | POA: Diagnosis not present

## 2019-06-03 DIAGNOSIS — R262 Difficulty in walking, not elsewhere classified: Secondary | ICD-10-CM | POA: Diagnosis not present

## 2019-06-03 DIAGNOSIS — R2681 Unsteadiness on feet: Secondary | ICD-10-CM | POA: Diagnosis not present

## 2019-06-04 DIAGNOSIS — R2681 Unsteadiness on feet: Secondary | ICD-10-CM | POA: Diagnosis not present

## 2019-06-04 DIAGNOSIS — M25561 Pain in right knee: Secondary | ICD-10-CM | POA: Diagnosis not present

## 2019-06-04 DIAGNOSIS — M6281 Muscle weakness (generalized): Secondary | ICD-10-CM | POA: Diagnosis not present

## 2019-06-04 DIAGNOSIS — E1142 Type 2 diabetes mellitus with diabetic polyneuropathy: Secondary | ICD-10-CM | POA: Diagnosis not present

## 2019-06-04 DIAGNOSIS — R262 Difficulty in walking, not elsewhere classified: Secondary | ICD-10-CM | POA: Diagnosis not present

## 2019-06-05 DIAGNOSIS — M6281 Muscle weakness (generalized): Secondary | ICD-10-CM | POA: Diagnosis not present

## 2019-06-05 DIAGNOSIS — M25561 Pain in right knee: Secondary | ICD-10-CM | POA: Diagnosis not present

## 2019-06-05 DIAGNOSIS — R2681 Unsteadiness on feet: Secondary | ICD-10-CM | POA: Diagnosis not present

## 2019-06-05 DIAGNOSIS — E1142 Type 2 diabetes mellitus with diabetic polyneuropathy: Secondary | ICD-10-CM | POA: Diagnosis not present

## 2019-06-05 DIAGNOSIS — R262 Difficulty in walking, not elsewhere classified: Secondary | ICD-10-CM | POA: Diagnosis not present

## 2019-06-08 DIAGNOSIS — R2681 Unsteadiness on feet: Secondary | ICD-10-CM | POA: Diagnosis not present

## 2019-06-08 DIAGNOSIS — E1142 Type 2 diabetes mellitus with diabetic polyneuropathy: Secondary | ICD-10-CM | POA: Diagnosis not present

## 2019-06-08 DIAGNOSIS — M6281 Muscle weakness (generalized): Secondary | ICD-10-CM | POA: Diagnosis not present

## 2019-06-08 DIAGNOSIS — R262 Difficulty in walking, not elsewhere classified: Secondary | ICD-10-CM | POA: Diagnosis not present

## 2019-06-08 DIAGNOSIS — M25561 Pain in right knee: Secondary | ICD-10-CM | POA: Diagnosis not present

## 2019-06-09 DIAGNOSIS — M25561 Pain in right knee: Secondary | ICD-10-CM | POA: Diagnosis not present

## 2019-06-09 DIAGNOSIS — M6281 Muscle weakness (generalized): Secondary | ICD-10-CM | POA: Diagnosis not present

## 2019-06-09 DIAGNOSIS — E1142 Type 2 diabetes mellitus with diabetic polyneuropathy: Secondary | ICD-10-CM | POA: Diagnosis not present

## 2019-06-09 DIAGNOSIS — R2681 Unsteadiness on feet: Secondary | ICD-10-CM | POA: Diagnosis not present

## 2019-06-09 DIAGNOSIS — R262 Difficulty in walking, not elsewhere classified: Secondary | ICD-10-CM | POA: Diagnosis not present

## 2019-06-10 DIAGNOSIS — M25561 Pain in right knee: Secondary | ICD-10-CM | POA: Diagnosis not present

## 2019-06-10 DIAGNOSIS — R262 Difficulty in walking, not elsewhere classified: Secondary | ICD-10-CM | POA: Diagnosis not present

## 2019-06-10 DIAGNOSIS — R2681 Unsteadiness on feet: Secondary | ICD-10-CM | POA: Diagnosis not present

## 2019-06-10 DIAGNOSIS — E1142 Type 2 diabetes mellitus with diabetic polyneuropathy: Secondary | ICD-10-CM | POA: Diagnosis not present

## 2019-06-10 DIAGNOSIS — M6281 Muscle weakness (generalized): Secondary | ICD-10-CM | POA: Diagnosis not present

## 2019-06-11 DIAGNOSIS — M6281 Muscle weakness (generalized): Secondary | ICD-10-CM | POA: Diagnosis not present

## 2019-06-11 DIAGNOSIS — E1142 Type 2 diabetes mellitus with diabetic polyneuropathy: Secondary | ICD-10-CM | POA: Diagnosis not present

## 2019-06-11 DIAGNOSIS — R262 Difficulty in walking, not elsewhere classified: Secondary | ICD-10-CM | POA: Diagnosis not present

## 2019-06-11 DIAGNOSIS — M25561 Pain in right knee: Secondary | ICD-10-CM | POA: Diagnosis not present

## 2019-06-11 DIAGNOSIS — R2681 Unsteadiness on feet: Secondary | ICD-10-CM | POA: Diagnosis not present

## 2019-06-12 DIAGNOSIS — R2681 Unsteadiness on feet: Secondary | ICD-10-CM | POA: Diagnosis not present

## 2019-06-12 DIAGNOSIS — R262 Difficulty in walking, not elsewhere classified: Secondary | ICD-10-CM | POA: Diagnosis not present

## 2019-06-12 DIAGNOSIS — E1142 Type 2 diabetes mellitus with diabetic polyneuropathy: Secondary | ICD-10-CM | POA: Diagnosis not present

## 2019-06-12 DIAGNOSIS — M25561 Pain in right knee: Secondary | ICD-10-CM | POA: Diagnosis not present

## 2019-06-12 DIAGNOSIS — M6281 Muscle weakness (generalized): Secondary | ICD-10-CM | POA: Diagnosis not present

## 2019-06-15 DIAGNOSIS — R262 Difficulty in walking, not elsewhere classified: Secondary | ICD-10-CM | POA: Diagnosis not present

## 2019-06-15 DIAGNOSIS — R2681 Unsteadiness on feet: Secondary | ICD-10-CM | POA: Diagnosis not present

## 2019-06-15 DIAGNOSIS — E1142 Type 2 diabetes mellitus with diabetic polyneuropathy: Secondary | ICD-10-CM | POA: Diagnosis not present

## 2019-06-15 DIAGNOSIS — M25561 Pain in right knee: Secondary | ICD-10-CM | POA: Diagnosis not present

## 2019-06-15 DIAGNOSIS — M6281 Muscle weakness (generalized): Secondary | ICD-10-CM | POA: Diagnosis not present

## 2019-06-16 DIAGNOSIS — E1142 Type 2 diabetes mellitus with diabetic polyneuropathy: Secondary | ICD-10-CM | POA: Diagnosis not present

## 2019-06-16 DIAGNOSIS — R2681 Unsteadiness on feet: Secondary | ICD-10-CM | POA: Diagnosis not present

## 2019-06-16 DIAGNOSIS — R262 Difficulty in walking, not elsewhere classified: Secondary | ICD-10-CM | POA: Diagnosis not present

## 2019-06-16 DIAGNOSIS — M25561 Pain in right knee: Secondary | ICD-10-CM | POA: Diagnosis not present

## 2019-06-16 DIAGNOSIS — M6281 Muscle weakness (generalized): Secondary | ICD-10-CM | POA: Diagnosis not present

## 2019-06-17 DIAGNOSIS — R262 Difficulty in walking, not elsewhere classified: Secondary | ICD-10-CM | POA: Diagnosis not present

## 2019-06-17 DIAGNOSIS — M6281 Muscle weakness (generalized): Secondary | ICD-10-CM | POA: Diagnosis not present

## 2019-06-17 DIAGNOSIS — R2681 Unsteadiness on feet: Secondary | ICD-10-CM | POA: Diagnosis not present

## 2019-06-17 DIAGNOSIS — M25561 Pain in right knee: Secondary | ICD-10-CM | POA: Diagnosis not present

## 2019-06-17 DIAGNOSIS — E1142 Type 2 diabetes mellitus with diabetic polyneuropathy: Secondary | ICD-10-CM | POA: Diagnosis not present

## 2019-06-18 DIAGNOSIS — M25561 Pain in right knee: Secondary | ICD-10-CM | POA: Diagnosis not present

## 2019-06-18 DIAGNOSIS — M6281 Muscle weakness (generalized): Secondary | ICD-10-CM | POA: Diagnosis not present

## 2019-06-18 DIAGNOSIS — R2681 Unsteadiness on feet: Secondary | ICD-10-CM | POA: Diagnosis not present

## 2019-06-18 DIAGNOSIS — E1142 Type 2 diabetes mellitus with diabetic polyneuropathy: Secondary | ICD-10-CM | POA: Diagnosis not present

## 2019-06-18 DIAGNOSIS — R262 Difficulty in walking, not elsewhere classified: Secondary | ICD-10-CM | POA: Diagnosis not present

## 2019-06-19 DIAGNOSIS — M6281 Muscle weakness (generalized): Secondary | ICD-10-CM | POA: Diagnosis not present

## 2019-06-19 DIAGNOSIS — M25561 Pain in right knee: Secondary | ICD-10-CM | POA: Diagnosis not present

## 2019-06-19 DIAGNOSIS — R2681 Unsteadiness on feet: Secondary | ICD-10-CM | POA: Diagnosis not present

## 2019-06-19 DIAGNOSIS — R262 Difficulty in walking, not elsewhere classified: Secondary | ICD-10-CM | POA: Diagnosis not present

## 2019-06-19 DIAGNOSIS — E1142 Type 2 diabetes mellitus with diabetic polyneuropathy: Secondary | ICD-10-CM | POA: Diagnosis not present

## 2019-06-22 DIAGNOSIS — R262 Difficulty in walking, not elsewhere classified: Secondary | ICD-10-CM | POA: Diagnosis not present

## 2019-06-22 DIAGNOSIS — R2681 Unsteadiness on feet: Secondary | ICD-10-CM | POA: Diagnosis not present

## 2019-06-22 DIAGNOSIS — M25561 Pain in right knee: Secondary | ICD-10-CM | POA: Diagnosis not present

## 2019-06-22 DIAGNOSIS — M6281 Muscle weakness (generalized): Secondary | ICD-10-CM | POA: Diagnosis not present

## 2019-06-22 DIAGNOSIS — E1142 Type 2 diabetes mellitus with diabetic polyneuropathy: Secondary | ICD-10-CM | POA: Diagnosis not present

## 2019-06-23 DIAGNOSIS — R262 Difficulty in walking, not elsewhere classified: Secondary | ICD-10-CM | POA: Diagnosis not present

## 2019-06-23 DIAGNOSIS — M6281 Muscle weakness (generalized): Secondary | ICD-10-CM | POA: Diagnosis not present

## 2019-06-23 DIAGNOSIS — R2681 Unsteadiness on feet: Secondary | ICD-10-CM | POA: Diagnosis not present

## 2019-06-23 DIAGNOSIS — M25561 Pain in right knee: Secondary | ICD-10-CM | POA: Diagnosis not present

## 2019-06-23 DIAGNOSIS — E1142 Type 2 diabetes mellitus with diabetic polyneuropathy: Secondary | ICD-10-CM | POA: Diagnosis not present

## 2019-06-24 DIAGNOSIS — M6281 Muscle weakness (generalized): Secondary | ICD-10-CM | POA: Diagnosis not present

## 2019-06-24 DIAGNOSIS — R2681 Unsteadiness on feet: Secondary | ICD-10-CM | POA: Diagnosis not present

## 2019-06-24 DIAGNOSIS — R262 Difficulty in walking, not elsewhere classified: Secondary | ICD-10-CM | POA: Diagnosis not present

## 2019-06-24 DIAGNOSIS — E1142 Type 2 diabetes mellitus with diabetic polyneuropathy: Secondary | ICD-10-CM | POA: Diagnosis not present

## 2019-06-24 DIAGNOSIS — M25561 Pain in right knee: Secondary | ICD-10-CM | POA: Diagnosis not present

## 2019-06-25 DIAGNOSIS — R262 Difficulty in walking, not elsewhere classified: Secondary | ICD-10-CM | POA: Diagnosis not present

## 2019-06-25 DIAGNOSIS — M25561 Pain in right knee: Secondary | ICD-10-CM | POA: Diagnosis not present

## 2019-06-25 DIAGNOSIS — E1142 Type 2 diabetes mellitus with diabetic polyneuropathy: Secondary | ICD-10-CM | POA: Diagnosis not present

## 2019-06-25 DIAGNOSIS — R2681 Unsteadiness on feet: Secondary | ICD-10-CM | POA: Diagnosis not present

## 2019-06-25 DIAGNOSIS — M6281 Muscle weakness (generalized): Secondary | ICD-10-CM | POA: Diagnosis not present

## 2019-06-26 DIAGNOSIS — E1142 Type 2 diabetes mellitus with diabetic polyneuropathy: Secondary | ICD-10-CM | POA: Diagnosis not present

## 2019-06-26 DIAGNOSIS — R262 Difficulty in walking, not elsewhere classified: Secondary | ICD-10-CM | POA: Diagnosis not present

## 2019-06-26 DIAGNOSIS — R2681 Unsteadiness on feet: Secondary | ICD-10-CM | POA: Diagnosis not present

## 2019-06-26 DIAGNOSIS — M6281 Muscle weakness (generalized): Secondary | ICD-10-CM | POA: Diagnosis not present

## 2019-06-26 DIAGNOSIS — M25561 Pain in right knee: Secondary | ICD-10-CM | POA: Diagnosis not present

## 2019-06-29 DIAGNOSIS — E1142 Type 2 diabetes mellitus with diabetic polyneuropathy: Secondary | ICD-10-CM | POA: Diagnosis not present

## 2019-06-29 DIAGNOSIS — M25561 Pain in right knee: Secondary | ICD-10-CM | POA: Diagnosis not present

## 2019-06-29 DIAGNOSIS — R262 Difficulty in walking, not elsewhere classified: Secondary | ICD-10-CM | POA: Diagnosis not present

## 2019-06-29 DIAGNOSIS — M6281 Muscle weakness (generalized): Secondary | ICD-10-CM | POA: Diagnosis not present

## 2019-06-29 DIAGNOSIS — R2681 Unsteadiness on feet: Secondary | ICD-10-CM | POA: Diagnosis not present

## 2019-06-30 DIAGNOSIS — M6281 Muscle weakness (generalized): Secondary | ICD-10-CM | POA: Diagnosis not present

## 2019-06-30 DIAGNOSIS — M25561 Pain in right knee: Secondary | ICD-10-CM | POA: Diagnosis not present

## 2019-06-30 DIAGNOSIS — E1142 Type 2 diabetes mellitus with diabetic polyneuropathy: Secondary | ICD-10-CM | POA: Diagnosis not present

## 2019-06-30 DIAGNOSIS — R262 Difficulty in walking, not elsewhere classified: Secondary | ICD-10-CM | POA: Diagnosis not present

## 2019-06-30 DIAGNOSIS — R2681 Unsteadiness on feet: Secondary | ICD-10-CM | POA: Diagnosis not present

## 2019-06-30 DIAGNOSIS — R300 Dysuria: Secondary | ICD-10-CM | POA: Diagnosis not present

## 2019-07-01 DIAGNOSIS — E1142 Type 2 diabetes mellitus with diabetic polyneuropathy: Secondary | ICD-10-CM | POA: Diagnosis not present

## 2019-07-01 DIAGNOSIS — R262 Difficulty in walking, not elsewhere classified: Secondary | ICD-10-CM | POA: Diagnosis not present

## 2019-07-01 DIAGNOSIS — R2681 Unsteadiness on feet: Secondary | ICD-10-CM | POA: Diagnosis not present

## 2019-07-01 DIAGNOSIS — M25561 Pain in right knee: Secondary | ICD-10-CM | POA: Diagnosis not present

## 2019-07-01 DIAGNOSIS — M6281 Muscle weakness (generalized): Secondary | ICD-10-CM | POA: Diagnosis not present

## 2019-07-02 DIAGNOSIS — R2681 Unsteadiness on feet: Secondary | ICD-10-CM | POA: Diagnosis not present

## 2019-07-02 DIAGNOSIS — M25561 Pain in right knee: Secondary | ICD-10-CM | POA: Diagnosis not present

## 2019-07-02 DIAGNOSIS — E1142 Type 2 diabetes mellitus with diabetic polyneuropathy: Secondary | ICD-10-CM | POA: Diagnosis not present

## 2019-07-02 DIAGNOSIS — R262 Difficulty in walking, not elsewhere classified: Secondary | ICD-10-CM | POA: Diagnosis not present

## 2019-07-02 DIAGNOSIS — M6281 Muscle weakness (generalized): Secondary | ICD-10-CM | POA: Diagnosis not present

## 2019-07-03 DIAGNOSIS — M6281 Muscle weakness (generalized): Secondary | ICD-10-CM | POA: Diagnosis not present

## 2019-07-03 DIAGNOSIS — R2681 Unsteadiness on feet: Secondary | ICD-10-CM | POA: Diagnosis not present

## 2019-07-03 DIAGNOSIS — M25561 Pain in right knee: Secondary | ICD-10-CM | POA: Diagnosis not present

## 2019-07-03 DIAGNOSIS — R262 Difficulty in walking, not elsewhere classified: Secondary | ICD-10-CM | POA: Diagnosis not present

## 2019-07-03 DIAGNOSIS — E1142 Type 2 diabetes mellitus with diabetic polyneuropathy: Secondary | ICD-10-CM | POA: Diagnosis not present

## 2019-07-06 DIAGNOSIS — M25561 Pain in right knee: Secondary | ICD-10-CM | POA: Diagnosis not present

## 2019-07-06 DIAGNOSIS — R2681 Unsteadiness on feet: Secondary | ICD-10-CM | POA: Diagnosis not present

## 2019-07-06 DIAGNOSIS — E1142 Type 2 diabetes mellitus with diabetic polyneuropathy: Secondary | ICD-10-CM | POA: Diagnosis not present

## 2019-07-06 DIAGNOSIS — R262 Difficulty in walking, not elsewhere classified: Secondary | ICD-10-CM | POA: Diagnosis not present

## 2019-07-06 DIAGNOSIS — M6281 Muscle weakness (generalized): Secondary | ICD-10-CM | POA: Diagnosis not present

## 2019-07-07 DIAGNOSIS — M6281 Muscle weakness (generalized): Secondary | ICD-10-CM | POA: Diagnosis not present

## 2019-07-07 DIAGNOSIS — M25561 Pain in right knee: Secondary | ICD-10-CM | POA: Diagnosis not present

## 2019-07-07 DIAGNOSIS — E1142 Type 2 diabetes mellitus with diabetic polyneuropathy: Secondary | ICD-10-CM | POA: Diagnosis not present

## 2019-07-07 DIAGNOSIS — R262 Difficulty in walking, not elsewhere classified: Secondary | ICD-10-CM | POA: Diagnosis not present

## 2019-07-07 DIAGNOSIS — R2681 Unsteadiness on feet: Secondary | ICD-10-CM | POA: Diagnosis not present

## 2019-07-08 DIAGNOSIS — R2681 Unsteadiness on feet: Secondary | ICD-10-CM | POA: Diagnosis not present

## 2019-07-08 DIAGNOSIS — M25561 Pain in right knee: Secondary | ICD-10-CM | POA: Diagnosis not present

## 2019-07-08 DIAGNOSIS — M6281 Muscle weakness (generalized): Secondary | ICD-10-CM | POA: Diagnosis not present

## 2019-07-08 DIAGNOSIS — R262 Difficulty in walking, not elsewhere classified: Secondary | ICD-10-CM | POA: Diagnosis not present

## 2019-07-08 DIAGNOSIS — E1142 Type 2 diabetes mellitus with diabetic polyneuropathy: Secondary | ICD-10-CM | POA: Diagnosis not present

## 2019-07-09 DIAGNOSIS — R2681 Unsteadiness on feet: Secondary | ICD-10-CM | POA: Diagnosis not present

## 2019-07-09 DIAGNOSIS — E1142 Type 2 diabetes mellitus with diabetic polyneuropathy: Secondary | ICD-10-CM | POA: Diagnosis not present

## 2019-07-09 DIAGNOSIS — R262 Difficulty in walking, not elsewhere classified: Secondary | ICD-10-CM | POA: Diagnosis not present

## 2019-07-09 DIAGNOSIS — M6281 Muscle weakness (generalized): Secondary | ICD-10-CM | POA: Diagnosis not present

## 2019-07-09 DIAGNOSIS — M25561 Pain in right knee: Secondary | ICD-10-CM | POA: Diagnosis not present

## 2019-07-10 DIAGNOSIS — E1142 Type 2 diabetes mellitus with diabetic polyneuropathy: Secondary | ICD-10-CM | POA: Diagnosis not present

## 2019-07-10 DIAGNOSIS — R2681 Unsteadiness on feet: Secondary | ICD-10-CM | POA: Diagnosis not present

## 2019-07-10 DIAGNOSIS — R262 Difficulty in walking, not elsewhere classified: Secondary | ICD-10-CM | POA: Diagnosis not present

## 2019-07-10 DIAGNOSIS — M25561 Pain in right knee: Secondary | ICD-10-CM | POA: Diagnosis not present

## 2019-07-10 DIAGNOSIS — M6281 Muscle weakness (generalized): Secondary | ICD-10-CM | POA: Diagnosis not present

## 2019-07-13 DIAGNOSIS — H5212 Myopia, left eye: Secondary | ICD-10-CM | POA: Diagnosis not present

## 2019-07-13 DIAGNOSIS — H401131 Primary open-angle glaucoma, bilateral, mild stage: Secondary | ICD-10-CM | POA: Diagnosis not present

## 2019-07-13 DIAGNOSIS — H524 Presbyopia: Secondary | ICD-10-CM | POA: Diagnosis not present

## 2019-07-13 DIAGNOSIS — E1142 Type 2 diabetes mellitus with diabetic polyneuropathy: Secondary | ICD-10-CM | POA: Diagnosis not present

## 2019-07-13 DIAGNOSIS — R262 Difficulty in walking, not elsewhere classified: Secondary | ICD-10-CM | POA: Diagnosis not present

## 2019-07-13 DIAGNOSIS — M25561 Pain in right knee: Secondary | ICD-10-CM | POA: Diagnosis not present

## 2019-07-13 DIAGNOSIS — R2681 Unsteadiness on feet: Secondary | ICD-10-CM | POA: Diagnosis not present

## 2019-07-13 DIAGNOSIS — H52223 Regular astigmatism, bilateral: Secondary | ICD-10-CM | POA: Diagnosis not present

## 2019-07-13 DIAGNOSIS — M6281 Muscle weakness (generalized): Secondary | ICD-10-CM | POA: Diagnosis not present

## 2019-07-14 DIAGNOSIS — E1142 Type 2 diabetes mellitus with diabetic polyneuropathy: Secondary | ICD-10-CM | POA: Diagnosis not present

## 2019-07-14 DIAGNOSIS — R262 Difficulty in walking, not elsewhere classified: Secondary | ICD-10-CM | POA: Diagnosis not present

## 2019-07-14 DIAGNOSIS — M6281 Muscle weakness (generalized): Secondary | ICD-10-CM | POA: Diagnosis not present

## 2019-07-14 DIAGNOSIS — R2681 Unsteadiness on feet: Secondary | ICD-10-CM | POA: Diagnosis not present

## 2019-07-14 DIAGNOSIS — N3946 Mixed incontinence: Secondary | ICD-10-CM | POA: Diagnosis not present

## 2019-07-14 DIAGNOSIS — M25561 Pain in right knee: Secondary | ICD-10-CM | POA: Diagnosis not present

## 2019-07-15 DIAGNOSIS — E1142 Type 2 diabetes mellitus with diabetic polyneuropathy: Secondary | ICD-10-CM | POA: Diagnosis not present

## 2019-07-15 DIAGNOSIS — R2681 Unsteadiness on feet: Secondary | ICD-10-CM | POA: Diagnosis not present

## 2019-07-15 DIAGNOSIS — R262 Difficulty in walking, not elsewhere classified: Secondary | ICD-10-CM | POA: Diagnosis not present

## 2019-07-15 DIAGNOSIS — M6281 Muscle weakness (generalized): Secondary | ICD-10-CM | POA: Diagnosis not present

## 2019-07-15 DIAGNOSIS — M25561 Pain in right knee: Secondary | ICD-10-CM | POA: Diagnosis not present

## 2019-07-16 DIAGNOSIS — R2689 Other abnormalities of gait and mobility: Secondary | ICD-10-CM | POA: Diagnosis not present

## 2019-07-16 DIAGNOSIS — I739 Peripheral vascular disease, unspecified: Secondary | ICD-10-CM | POA: Diagnosis not present

## 2019-07-16 DIAGNOSIS — Z5189 Encounter for other specified aftercare: Secondary | ICD-10-CM | POA: Diagnosis not present

## 2019-07-17 DIAGNOSIS — M6281 Muscle weakness (generalized): Secondary | ICD-10-CM | POA: Diagnosis not present

## 2019-07-17 DIAGNOSIS — M25561 Pain in right knee: Secondary | ICD-10-CM | POA: Diagnosis not present

## 2019-07-17 DIAGNOSIS — E1142 Type 2 diabetes mellitus with diabetic polyneuropathy: Secondary | ICD-10-CM | POA: Diagnosis not present

## 2019-07-17 DIAGNOSIS — R2681 Unsteadiness on feet: Secondary | ICD-10-CM | POA: Diagnosis not present

## 2019-07-17 DIAGNOSIS — R262 Difficulty in walking, not elsewhere classified: Secondary | ICD-10-CM | POA: Diagnosis not present

## 2019-07-20 DIAGNOSIS — I129 Hypertensive chronic kidney disease with stage 1 through stage 4 chronic kidney disease, or unspecified chronic kidney disease: Secondary | ICD-10-CM | POA: Diagnosis not present

## 2019-07-20 DIAGNOSIS — Z5189 Encounter for other specified aftercare: Secondary | ICD-10-CM | POA: Diagnosis not present

## 2019-07-20 DIAGNOSIS — D508 Other iron deficiency anemias: Secondary | ICD-10-CM | POA: Diagnosis not present

## 2019-07-20 DIAGNOSIS — N1831 Chronic kidney disease, stage 3a: Secondary | ICD-10-CM | POA: Diagnosis not present

## 2019-07-20 DIAGNOSIS — E1129 Type 2 diabetes mellitus with other diabetic kidney complication: Secondary | ICD-10-CM | POA: Diagnosis not present

## 2019-07-20 DIAGNOSIS — N3281 Overactive bladder: Secondary | ICD-10-CM | POA: Diagnosis not present

## 2019-07-20 DIAGNOSIS — E538 Deficiency of other specified B group vitamins: Secondary | ICD-10-CM | POA: Diagnosis not present

## 2019-07-20 DIAGNOSIS — R5381 Other malaise: Secondary | ICD-10-CM | POA: Diagnosis not present

## 2019-07-20 DIAGNOSIS — K219 Gastro-esophageal reflux disease without esophagitis: Secondary | ICD-10-CM | POA: Diagnosis not present

## 2019-07-21 DIAGNOSIS — E1142 Type 2 diabetes mellitus with diabetic polyneuropathy: Secondary | ICD-10-CM | POA: Diagnosis not present

## 2019-07-21 DIAGNOSIS — M25561 Pain in right knee: Secondary | ICD-10-CM | POA: Diagnosis not present

## 2019-07-21 DIAGNOSIS — R262 Difficulty in walking, not elsewhere classified: Secondary | ICD-10-CM | POA: Diagnosis not present

## 2019-07-21 DIAGNOSIS — R2681 Unsteadiness on feet: Secondary | ICD-10-CM | POA: Diagnosis not present

## 2019-07-21 DIAGNOSIS — M6281 Muscle weakness (generalized): Secondary | ICD-10-CM | POA: Diagnosis not present

## 2019-07-22 DIAGNOSIS — M6281 Muscle weakness (generalized): Secondary | ICD-10-CM | POA: Diagnosis not present

## 2019-07-22 DIAGNOSIS — R2681 Unsteadiness on feet: Secondary | ICD-10-CM | POA: Diagnosis not present

## 2019-07-22 DIAGNOSIS — E1142 Type 2 diabetes mellitus with diabetic polyneuropathy: Secondary | ICD-10-CM | POA: Diagnosis not present

## 2019-07-22 DIAGNOSIS — Z23 Encounter for immunization: Secondary | ICD-10-CM | POA: Diagnosis not present

## 2019-07-22 DIAGNOSIS — R2689 Other abnormalities of gait and mobility: Secondary | ICD-10-CM | POA: Diagnosis not present

## 2019-07-23 DIAGNOSIS — R350 Frequency of micturition: Secondary | ICD-10-CM | POA: Diagnosis not present

## 2019-07-23 DIAGNOSIS — N3946 Mixed incontinence: Secondary | ICD-10-CM | POA: Diagnosis not present

## 2019-07-27 DIAGNOSIS — E1142 Type 2 diabetes mellitus with diabetic polyneuropathy: Secondary | ICD-10-CM | POA: Diagnosis not present

## 2019-07-27 DIAGNOSIS — R2681 Unsteadiness on feet: Secondary | ICD-10-CM | POA: Diagnosis not present

## 2019-07-27 DIAGNOSIS — M6281 Muscle weakness (generalized): Secondary | ICD-10-CM | POA: Diagnosis not present

## 2019-07-27 DIAGNOSIS — R2689 Other abnormalities of gait and mobility: Secondary | ICD-10-CM | POA: Diagnosis not present

## 2019-07-28 DIAGNOSIS — M6281 Muscle weakness (generalized): Secondary | ICD-10-CM | POA: Diagnosis not present

## 2019-07-28 DIAGNOSIS — R2689 Other abnormalities of gait and mobility: Secondary | ICD-10-CM | POA: Diagnosis not present

## 2019-07-28 DIAGNOSIS — E1142 Type 2 diabetes mellitus with diabetic polyneuropathy: Secondary | ICD-10-CM | POA: Diagnosis not present

## 2019-07-28 DIAGNOSIS — R2681 Unsteadiness on feet: Secondary | ICD-10-CM | POA: Diagnosis not present

## 2019-07-29 DIAGNOSIS — R2681 Unsteadiness on feet: Secondary | ICD-10-CM | POA: Diagnosis not present

## 2019-07-29 DIAGNOSIS — R2689 Other abnormalities of gait and mobility: Secondary | ICD-10-CM | POA: Diagnosis not present

## 2019-07-29 DIAGNOSIS — M6281 Muscle weakness (generalized): Secondary | ICD-10-CM | POA: Diagnosis not present

## 2019-07-29 DIAGNOSIS — E1142 Type 2 diabetes mellitus with diabetic polyneuropathy: Secondary | ICD-10-CM | POA: Diagnosis not present

## 2019-07-30 DIAGNOSIS — R2689 Other abnormalities of gait and mobility: Secondary | ICD-10-CM | POA: Diagnosis not present

## 2019-07-30 DIAGNOSIS — E1142 Type 2 diabetes mellitus with diabetic polyneuropathy: Secondary | ICD-10-CM | POA: Diagnosis not present

## 2019-07-30 DIAGNOSIS — M6281 Muscle weakness (generalized): Secondary | ICD-10-CM | POA: Diagnosis not present

## 2019-07-30 DIAGNOSIS — R2681 Unsteadiness on feet: Secondary | ICD-10-CM | POA: Diagnosis not present

## 2019-07-31 DIAGNOSIS — M6281 Muscle weakness (generalized): Secondary | ICD-10-CM | POA: Diagnosis not present

## 2019-07-31 DIAGNOSIS — R2689 Other abnormalities of gait and mobility: Secondary | ICD-10-CM | POA: Diagnosis not present

## 2019-07-31 DIAGNOSIS — E1142 Type 2 diabetes mellitus with diabetic polyneuropathy: Secondary | ICD-10-CM | POA: Diagnosis not present

## 2019-07-31 DIAGNOSIS — R2681 Unsteadiness on feet: Secondary | ICD-10-CM | POA: Diagnosis not present

## 2019-08-03 DIAGNOSIS — R2689 Other abnormalities of gait and mobility: Secondary | ICD-10-CM | POA: Diagnosis not present

## 2019-08-03 DIAGNOSIS — E1142 Type 2 diabetes mellitus with diabetic polyneuropathy: Secondary | ICD-10-CM | POA: Diagnosis not present

## 2019-08-03 DIAGNOSIS — R2681 Unsteadiness on feet: Secondary | ICD-10-CM | POA: Diagnosis not present

## 2019-08-03 DIAGNOSIS — M6281 Muscle weakness (generalized): Secondary | ICD-10-CM | POA: Diagnosis not present

## 2019-08-12 ENCOUNTER — Ambulatory Visit (INDEPENDENT_AMBULATORY_CARE_PROVIDER_SITE_OTHER): Payer: Medicare Other | Admitting: *Deleted

## 2019-08-12 DIAGNOSIS — I442 Atrioventricular block, complete: Secondary | ICD-10-CM | POA: Diagnosis not present

## 2019-08-12 DIAGNOSIS — N390 Urinary tract infection, site not specified: Secondary | ICD-10-CM | POA: Diagnosis not present

## 2019-08-12 LAB — CUP PACEART REMOTE DEVICE CHECK
Battery Remaining Longevity: 61 mo
Battery Remaining Percentage: 50 %
Battery Voltage: 2.87 V
Brady Statistic AP VP Percent: 1 %
Brady Statistic AP VS Percent: 1 %
Brady Statistic AS VP Percent: 99 %
Brady Statistic AS VS Percent: 1 %
Brady Statistic RA Percent Paced: 1 %
Brady Statistic RV Percent Paced: 99 %
Date Time Interrogation Session: 20210531020023
Implantable Lead Implant Date: 20140703
Implantable Lead Implant Date: 20140703
Implantable Lead Location: 753859
Implantable Lead Location: 753860
Implantable Pulse Generator Implant Date: 20140703
Lead Channel Impedance Value: 450 Ohm
Lead Channel Impedance Value: 540 Ohm
Lead Channel Pacing Threshold Amplitude: 0.5 V
Lead Channel Pacing Threshold Amplitude: 1.125 V
Lead Channel Pacing Threshold Pulse Width: 0.4 ms
Lead Channel Pacing Threshold Pulse Width: 0.4 ms
Lead Channel Sensing Intrinsic Amplitude: 12 mV
Lead Channel Sensing Intrinsic Amplitude: 2.3 mV
Lead Channel Setting Pacing Amplitude: 1.375
Lead Channel Setting Pacing Amplitude: 2 V
Lead Channel Setting Pacing Pulse Width: 0.4 ms
Lead Channel Setting Sensing Sensitivity: 12.5 mV
Pulse Gen Model: 2240
Pulse Gen Serial Number: 7522008

## 2019-08-18 NOTE — Progress Notes (Signed)
Remote pacemaker transmission.   

## 2019-09-09 ENCOUNTER — Encounter: Payer: Self-pay | Admitting: Neurology

## 2019-09-09 ENCOUNTER — Ambulatory Visit (INDEPENDENT_AMBULATORY_CARE_PROVIDER_SITE_OTHER): Payer: Medicare Other | Admitting: Neurology

## 2019-09-09 VITALS — BP 166/79 | HR 75

## 2019-09-09 DIAGNOSIS — G243 Spasmodic torticollis: Secondary | ICD-10-CM

## 2019-09-09 NOTE — Progress Notes (Signed)
**  Botox 100 units x 1 vial, NDC 4996-9249-32, Lot [id number], Exp 12/2021, office supply.//mck,rn**

## 2019-09-14 MED ORDER — ONABOTULINUMTOXINA 100 UNITS IJ SOLR: 100.0000 [IU] | Freq: Once | INTRAMUSCULAR | Status: DC

## 2019-09-14 NOTE — Progress Notes (Signed)
PATIENT: Wendy Velasquez DOB: [birth year]  Chief Complaint  Patient presents with  . Cervical Dystonia    Botox 100 units x 1 vial - office supply     HISTORICAL  Wendy Velasquez is a 86 years old female, seen in request by her primary care physician Dr. Osborne Casco, [place] Him for evaluation of neck pain, headaches, initial evaluation was on December 05, 2017.  She is accompanied by her daughter at today's clinical visit.  I have reviewed and summarized the referring note from the referring physician, she has past medical history of diabetes, use insulin, history of cardiac block, status post pacemaker, hyperlipidemia,  She had a history of chronic neck pain, had acute worsening since March 2018, she woke up one morning, noticed upper neck throbbing pain, radiating pain to her skull, has been persistent since then, over the last 1 year, she was seen by orthopedic PA, Ronette Deter, was treated with multiple trigger point injection, with mixture of 1% lidocaine, and Depo-Medrol, most recent injection was on October 10, 2017, on a monthly basis, patient denies significant improvement, also complains of worsening glucose control after injection,  She denies significant radiating pain to bilateral shoulder upper extremity, denies difficulty using arms, she had chronic urinary incontinence is receiving Botox injection by urologist, she contributed to her gait abnormality to her bilateral knee pain, chronic arthritis,  She now complains of difficulty turning her neck, 6 out of 10 constant deep achy pain, multiple tender spots at cervical, upper trapezius, oftentimes she has transient sharp radiating range of motion, difficulty turning her neck,  She also tried physical therapy, is doing morning neck traction regularly,  UPDATE Jan 02 2018: She continue complains of significant neck pain, 8 out of 10 sometimes, radiating pain to bilateral occipital region despite gabapentin 100 mg 5 tablets each  day, I have personally reviewed CT cervical spine on December 10, 2017, no acute abnormality, multilevel spondylolisthesis, maximum at C4-5, with associated   facet arthropathy, evidence of ankylosis, mild spinal stenosis at the C2-3, C3-4 level  UPDATE Apr 08 2018: She responded very well to her initial injection January 02, 2018, we used 100 units of Botox, a month after injection she was pain-free for 2 months, there was no significant side effect noticed, in the past 2 weeks, she noticed returning of her neck pain, centered at bilateral mastoid process, nuchal line, tenderness upon deep palpitation.  UPDATE August 12 2018: She responded very well to previous injection  UPDATE Sept 9 2020: She responded well to previous injection, we used Botox a 100 units, is take about 3 days for the benefit to kicking, lasting for 2 to 3 months, no significant side effect.  UPDATE Feb 25 2019: She responded well to previous injection  UPDATE June 01 2019: She suffered COVID-29 January 2020, required hospitalization but no intubation, she complains of increased bilateral upper back pain, neck pain.  Update September 09 2019 She complains of worsening neck pain, she was admitted to hospital in early February for acute hypoxic respiratory failure due to COVID-19 pneumonia, she was treated with remdesivir, did not require intubation, however, since the infection, she complains of declining of her status, could no longer walking, rely on wheelchair, fatigue,   REVIEW OF SYSTEMS: Full 14 system review of systems performed and notable only for as above. ALLERGIES: No Known Allergies  HOME MEDICATIONS: Current Outpatient Medications  Medication Sig Dispense Refill  . acetaminophen (TYLENOL) 500 MG tablet Take 1,000 mg  by mouth every 6 (six) hours as needed for fever.    Marland Kitchen albuterol (PROVENTIL) (2.5 MG/3ML) 0.083% nebulizer solution Take 2.5 mg by nebulization every 6 (six) hours as needed for wheezing or  shortness of breath.    Marland Kitchen albuterol-ipratropium (COMBIVENT) 18-103 MCG/ACT inhaler Inhale 2 puffs into the lungs every 8 (eight) hours as needed for wheezing.     Marland Kitchen aspirin EC 81 MG tablet Take 81 mg by mouth daily.    . botulinum toxin Type A (BOTOX) 100 units SOLR injection Inject 100 Units into the muscle every 3 (three) months.    . Brinzolamide-Brimonidine (SIMBRINZA) 1-0.2 % SUSP Apply 1 drop to eye at bedtime.    . cetirizine (ZYRTEC) 10 MG tablet Take 10 mg by mouth daily as needed for allergies.     . Esomeprazole Magnesium (NEXIUM PO) Take 1 tablet by mouth daily.    . fluticasone (FLONASE) 50 MCG/ACT nasal spray Place 2 sprays into both nostrils daily as needed for allergies. SHAKE AS DIRECTED     . Fluticasone-Salmeterol (ADVAIR DISKUS) 250-50 MCG/DOSE AEPB Inhale 1 puff into the lungs 2 (two) times daily.    Marland Kitchen gabapentin (NEURONTIN) 100 MG capsule Take 2 capsules in am, 2 capsules midday, 1 capsule at bedtime. 450 capsule 3  . ibandronate (BONIVA) 150 MG tablet Take 150 mg by mouth every 30 (thirty) days. Take in the morning with a full glass of water, on an empty stomach, and do not take anything else by mouth or lie down for the next 30 min.    . insulin glargine (LANTUS) 100 UNIT/ML injection Inject 12 Units into the skin at bedtime. 9 pm daily    . lidocaine-prilocaine (EMLA) cream Apply 1 application topically 4 (four) times daily as needed. (Patient taking differently: Apply 1 application topically 4 (four) times daily as needed (pain). ) 30 g 11  . Menthol, Topical Analgesic, (BIOFREEZE) 4 % GEL Apply 1 application topically daily as needed (pain).    . metFORMIN (GLUCOPHAGE) 1000 MG tablet Take 1,000 mg by mouth 2 (two) times daily with a meal.    . MYRBETRIQ 25 MG TB24 tablet Take 25 mg by mouth daily.   6  . sertraline (ZOLOFT) 50 MG tablet Take 50 mg by mouth 2 (two) times daily.    Marland Kitchen trimethoprim (TRIMPEX) 100 MG tablet Take 1 tablet (100 mg total) by mouth daily. OK TO  RESUME AFTER YOU FINISH CURRENT ANTIBIOTIC THERAPY.    . vitamin B-12 (CYANOCOBALAMIN) 1000 MCG tablet Take 1,000 mcg by mouth daily.     No current facility-administered medications for this visit.    PAST MEDICAL HISTORY: Past Medical History:  Diagnosis Date  . Asthma   . Cardiac pacemaker in situ   . Complete heart block ([place])   . COPD (chronic obstructive pulmonary disease) ([place])   . Diverticulosis of colon   . GERD (gastroesophageal reflux disease)   . Glaucoma of both eyes   . Heart disease   . History of cardiac arrest    09-11-2012--  symptomatic complete heart block -- hr 20's--  cpr, intubated and temp. pacer until  perm. pacemaker placement  . History of esophageal dilatation    for stricture 2007  . History of hiatal hernia   . History of iron deficiency anemia    hx transfusion's 15 yrs ago , approx 2001  . Hyperlipidemia   . Hypertension   . Neck pain   . Peripheral neuropathy   . Tachy-brady  syndrome ([place])   . Type 2 diabetes mellitus ([place])   . Urgency incontinence    Botox injections for bladder.  . Wears glasses     PAST SURGICAL HISTORY: Past Surgical History:  Procedure Laterality Date  . ABDOMINAL HYSTERECTOMY  1976  approx   w/ unilateral salpingoophorectomy  . CATARACT EXTRACTION W/ INTRAOCULAR LENS  IMPLANT, BILATERAL  1990  . COLONOSCOPY WITH ESOPHAGOGASTRODUODENOSCOPY (EGD)  last one 2007  . INTERSTIM IMPLANT PLACEMENT  2012  . INTERSTIM IMPLANT REMOVAL N/A 09/21/2014   Procedure: REMOVAL OF INTERSTIM IMPLANT;  Surgeon: Bjorn Loser, MD;  [facility];  Service: Urology;  Laterality: N/A;  . INTERSTIM IMPLANT REVISION N/A 09/21/2014   Procedure: Barrie Lyme STAGE ONE AND TWO;  Surgeon: Bjorn Loser, MD;  [facility];  Service: Urology;  Laterality: N/A;  . LAPAROTOMY W/ UNILATERAL SALPINGOOPHORECTOMY  1960's  approx  . PERMANENT PACEMAKER INSERTION N/A 09/11/2012   Procedure: PERMANENT  PACEMAKER INSERTION;  Surgeon: Evans Lance, MD;  Location: [facility] CATH LAB;  Service: Cardiovascular;  Laterality: N/A;  St. Jude Dual Chamber  . RECTOCELE REPAIR  1991  approx    FAMILY HISTORY: Family History  Problem Relation Age of Onset  . Other Mother        died during her childbirth  . Prostatitis Father   . Heart failure Sister   . Breast cancer Sister   . Diabetic kidney disease Sister   . Melanoma Brother     SOCIAL HISTORY: Social History   Socioeconomic History  . Marital status: Married    Spouse name: Not on file  . Number of children: 3  . Years of education: 3  . Highest education level: High school graduate  Occupational History  . Occupation: Retired  Tobacco Use  . Smoking status: Never Smoker  . Smokeless tobacco: Never Used  Vaping Use  . Vaping Use: Never used  Substance and Sexual Activity  . Alcohol use: No  . Drug use: No  . Sexual activity: Not Currently    Birth control/protection: None  Other Topics Concern  . Not on file  Social History Narrative   Lives with her husband in an independent living facility.   Right-handed.   1-2 cup caffeine daily.   Social Determinants of Health   Financial Resource Strain:   . Difficulty of Paying Living Expenses:   Food Insecurity:   . Worried About Charity fundraiser in the Last Year:   . Arboriculturist in the Last Year:   Transportation Needs:   . Film/video editor (Medical):   Marland Kitchen Lack of Transportation (Non-Medical):   Physical Activity:   . Days of Exercise per Week:   . Minutes of Exercise per Session:   Stress:   . Feeling of Stress :   Social Connections:   . Frequency of Communication with Friends and Family:   . Frequency of Social Gatherings with Friends and Family:   . Attends Religious Services:   . Active Member of Clubs or Organizations:   . Attends Archivist Meetings:   Marland Kitchen Marital Status:   Intimate Partner Violence:   . Fear of Current or Ex-Partner:   .  Emotionally Abused:   Marland Kitchen Physically Abused:   . Sexually Abused:      PHYSICAL EXAM   PHYSICAL EXAMNIATION:  Gen: NAD, conversant, well nourised, well groomed  Cardiovascular: Regular rate rhythm, no peripheral edema, warm, nontender. Eyes: Conjunctivae clear without exudates or hemorrhage Neck: Supple, no carotid bruits. Pulmonary: Clear to auscultation bilaterally   NEUROLOGICAL EXAM: Multiple posterior neck muscle spasm, especially at bilateral levator scapula, latissimus longus, scapular cervix,  MENTAL STATUS: Speech/Cognition: Awake, alert, normal speech, oriented to history taking and casual conversation.  CRANIAL NERVES: CN II: Visual fields are full to confrontation.  Pupils are round equal and briskly reactive to light. CN III, IV, VI: extraocular movement are normal. No ptosis. CN V: Facial sensation is intact to light touch. CN VII: Face is symmetric with normal eye closure and smile. CN VIII: Hearing is normal to casual conversation CN IX, X: Palate elevates symmetrically. Phonation is normal. CN XI: Head turning and shoulder shrug are intact   MOTOR: Muscle bulk and tone are normal. Muscle strength is normal.  REFLEXES: Reflexes are 2  and symmetric at the biceps, triceps, knees and ankles. Plantar responses are flexor.  Bilateral lower extremity pitting edema  SENSORY: Length dependent decreased light touch, pinprick, COORDINATION: There is no trunk or limb ataxia.    GAIT/STANCE: She needs 2 people assistant to get up from seated position, cautious, unsteady wide-based gait  DIAGNOSTIC DATA (LABS, IMAGING, TESTING) - I reviewed patient records, labs, notes, testing and imaging myself where available.   ASSESSMENT AND PLAN  LATANIA BASCOMB is a 86 y.o. female    Chronic neck pain, headaches Abnormal neck posturing, mild retrocollis, left tilt, significant tenderness at bilateral lateral cervical paraspinal muscles, bilateral  levator scapular   Worsening gait abnormalities  She has significant hyperreflexia on examination,  CT cervical showed evidence of multilevel spondylolisthesis, maximum at C4-5 with associated facet arthropathy, developing ankylosis  To evaluate her worsening gait abnormality, I have suggested CT myelogram, not MRI candidate due to pacemaker placement, her daughter will discuss with rest of family members, will call back if decided to proceed   Her significant neck pain had tried and failed multiple treatment in the past, including neck traction, heating pad, trigger point injection, gabapentin, responded very well to EMG guided Botox injection, initial injection was on January 02, 2018,  EMG guided Botox injection Used 100 units of Botox A    Right longissimus capitis 12.5 units Right splenius cervix 12.5 Right semispinalis 12.5 units Right levator scapular 12.5 units  Left longissimus capitis 25 units Left splenius cervix 12.5 units Left semispinalis 12.5 units Left levator scapular 12.5 units     Marcial Pacas, M.D. Ph.D.  [facility] [address], [place], [place] [address] Ph: [phone] Fax: [phone]

## 2019-09-23 DIAGNOSIS — E7800 Pure hypercholesterolemia, unspecified: Secondary | ICD-10-CM | POA: Diagnosis not present

## 2019-09-23 DIAGNOSIS — F3341 Major depressive disorder, recurrent, in partial remission: Secondary | ICD-10-CM | POA: Diagnosis not present

## 2019-09-23 DIAGNOSIS — R809 Proteinuria, unspecified: Secondary | ICD-10-CM | POA: Diagnosis not present

## 2019-09-23 DIAGNOSIS — N3281 Overactive bladder: Secondary | ICD-10-CM | POA: Diagnosis not present

## 2019-09-23 DIAGNOSIS — E538 Deficiency of other specified B group vitamins: Secondary | ICD-10-CM | POA: Diagnosis not present

## 2019-09-23 DIAGNOSIS — R2689 Other abnormalities of gait and mobility: Secondary | ICD-10-CM | POA: Diagnosis not present

## 2019-09-23 DIAGNOSIS — Z8616 Personal history of COVID-19: Secondary | ICD-10-CM | POA: Diagnosis not present

## 2019-09-23 DIAGNOSIS — N1831 Chronic kidney disease, stage 3a: Secondary | ICD-10-CM | POA: Diagnosis not present

## 2019-09-23 DIAGNOSIS — E559 Vitamin D deficiency, unspecified: Secondary | ICD-10-CM | POA: Diagnosis not present

## 2019-09-23 DIAGNOSIS — I739 Peripheral vascular disease, unspecified: Secondary | ICD-10-CM | POA: Diagnosis not present

## 2019-09-23 DIAGNOSIS — Z950 Presence of cardiac pacemaker: Secondary | ICD-10-CM | POA: Diagnosis not present

## 2019-09-23 DIAGNOSIS — E1129 Type 2 diabetes mellitus with other diabetic kidney complication: Secondary | ICD-10-CM | POA: Diagnosis not present

## 2019-10-05 DIAGNOSIS — I129 Hypertensive chronic kidney disease with stage 1 through stage 4 chronic kidney disease, or unspecified chronic kidney disease: Secondary | ICD-10-CM | POA: Diagnosis not present

## 2019-10-05 DIAGNOSIS — Z794 Long term (current) use of insulin: Secondary | ICD-10-CM | POA: Diagnosis not present

## 2019-10-05 DIAGNOSIS — E1122 Type 2 diabetes mellitus with diabetic chronic kidney disease: Secondary | ICD-10-CM | POA: Diagnosis not present

## 2019-10-05 DIAGNOSIS — H269 Unspecified cataract: Secondary | ICD-10-CM | POA: Diagnosis not present

## 2019-10-05 DIAGNOSIS — Z8616 Personal history of COVID-19: Secondary | ICD-10-CM | POA: Diagnosis not present

## 2019-10-05 DIAGNOSIS — Z7982 Long term (current) use of aspirin: Secondary | ICD-10-CM | POA: Diagnosis not present

## 2019-10-05 DIAGNOSIS — E1151 Type 2 diabetes mellitus with diabetic peripheral angiopathy without gangrene: Secondary | ICD-10-CM | POA: Diagnosis not present

## 2019-10-05 DIAGNOSIS — Z792 Long term (current) use of antibiotics: Secondary | ICD-10-CM | POA: Diagnosis not present

## 2019-10-05 DIAGNOSIS — F329 Major depressive disorder, single episode, unspecified: Secondary | ICD-10-CM | POA: Diagnosis not present

## 2019-10-05 DIAGNOSIS — J449 Chronic obstructive pulmonary disease, unspecified: Secondary | ICD-10-CM | POA: Diagnosis not present

## 2019-10-05 DIAGNOSIS — E538 Deficiency of other specified B group vitamins: Secondary | ICD-10-CM | POA: Diagnosis not present

## 2019-10-05 DIAGNOSIS — J45909 Unspecified asthma, uncomplicated: Secondary | ICD-10-CM | POA: Diagnosis not present

## 2019-10-05 DIAGNOSIS — E1136 Type 2 diabetes mellitus with diabetic cataract: Secondary | ICD-10-CM | POA: Diagnosis not present

## 2019-10-05 DIAGNOSIS — Z7951 Long term (current) use of inhaled steroids: Secondary | ICD-10-CM | POA: Diagnosis not present

## 2019-10-05 DIAGNOSIS — M810 Age-related osteoporosis without current pathological fracture: Secondary | ICD-10-CM | POA: Diagnosis not present

## 2019-10-05 DIAGNOSIS — N1831 Chronic kidney disease, stage 3a: Secondary | ICD-10-CM | POA: Diagnosis not present

## 2019-10-05 DIAGNOSIS — J4550 Severe persistent asthma, uncomplicated: Secondary | ICD-10-CM | POA: Diagnosis not present

## 2019-10-05 DIAGNOSIS — Z9181 History of falling: Secondary | ICD-10-CM | POA: Diagnosis not present

## 2019-10-05 DIAGNOSIS — E7800 Pure hypercholesterolemia, unspecified: Secondary | ICD-10-CM | POA: Diagnosis not present

## 2019-10-05 DIAGNOSIS — K219 Gastro-esophageal reflux disease without esophagitis: Secondary | ICD-10-CM | POA: Diagnosis not present

## 2019-10-07 DIAGNOSIS — N1831 Chronic kidney disease, stage 3a: Secondary | ICD-10-CM | POA: Diagnosis not present

## 2019-10-07 DIAGNOSIS — E1136 Type 2 diabetes mellitus with diabetic cataract: Secondary | ICD-10-CM | POA: Diagnosis not present

## 2019-10-07 DIAGNOSIS — I129 Hypertensive chronic kidney disease with stage 1 through stage 4 chronic kidney disease, or unspecified chronic kidney disease: Secondary | ICD-10-CM | POA: Diagnosis not present

## 2019-10-07 DIAGNOSIS — J449 Chronic obstructive pulmonary disease, unspecified: Secondary | ICD-10-CM | POA: Diagnosis not present

## 2019-10-07 DIAGNOSIS — K219 Gastro-esophageal reflux disease without esophagitis: Secondary | ICD-10-CM | POA: Diagnosis not present

## 2019-10-07 DIAGNOSIS — E1122 Type 2 diabetes mellitus with diabetic chronic kidney disease: Secondary | ICD-10-CM | POA: Diagnosis not present

## 2019-10-20 DIAGNOSIS — K219 Gastro-esophageal reflux disease without esophagitis: Secondary | ICD-10-CM | POA: Diagnosis not present

## 2019-10-20 DIAGNOSIS — N1831 Chronic kidney disease, stage 3a: Secondary | ICD-10-CM | POA: Diagnosis not present

## 2019-10-20 DIAGNOSIS — J449 Chronic obstructive pulmonary disease, unspecified: Secondary | ICD-10-CM | POA: Diagnosis not present

## 2019-10-20 DIAGNOSIS — I129 Hypertensive chronic kidney disease with stage 1 through stage 4 chronic kidney disease, or unspecified chronic kidney disease: Secondary | ICD-10-CM | POA: Diagnosis not present

## 2019-10-20 DIAGNOSIS — E1122 Type 2 diabetes mellitus with diabetic chronic kidney disease: Secondary | ICD-10-CM | POA: Diagnosis not present

## 2019-10-20 DIAGNOSIS — E1136 Type 2 diabetes mellitus with diabetic cataract: Secondary | ICD-10-CM | POA: Diagnosis not present

## 2019-10-26 DIAGNOSIS — E1122 Type 2 diabetes mellitus with diabetic chronic kidney disease: Secondary | ICD-10-CM | POA: Diagnosis not present

## 2019-10-26 DIAGNOSIS — E1136 Type 2 diabetes mellitus with diabetic cataract: Secondary | ICD-10-CM | POA: Diagnosis not present

## 2019-10-26 DIAGNOSIS — I129 Hypertensive chronic kidney disease with stage 1 through stage 4 chronic kidney disease, or unspecified chronic kidney disease: Secondary | ICD-10-CM | POA: Diagnosis not present

## 2019-10-26 DIAGNOSIS — N1831 Chronic kidney disease, stage 3a: Secondary | ICD-10-CM | POA: Diagnosis not present

## 2019-10-26 DIAGNOSIS — J449 Chronic obstructive pulmonary disease, unspecified: Secondary | ICD-10-CM | POA: Diagnosis not present

## 2019-10-26 DIAGNOSIS — K219 Gastro-esophageal reflux disease without esophagitis: Secondary | ICD-10-CM | POA: Diagnosis not present

## 2019-10-29 DIAGNOSIS — N390 Urinary tract infection, site not specified: Secondary | ICD-10-CM | POA: Diagnosis not present

## 2019-10-30 DIAGNOSIS — I129 Hypertensive chronic kidney disease with stage 1 through stage 4 chronic kidney disease, or unspecified chronic kidney disease: Secondary | ICD-10-CM | POA: Diagnosis not present

## 2019-10-30 DIAGNOSIS — E1136 Type 2 diabetes mellitus with diabetic cataract: Secondary | ICD-10-CM | POA: Diagnosis not present

## 2019-10-30 DIAGNOSIS — E1122 Type 2 diabetes mellitus with diabetic chronic kidney disease: Secondary | ICD-10-CM | POA: Diagnosis not present

## 2019-10-30 DIAGNOSIS — J449 Chronic obstructive pulmonary disease, unspecified: Secondary | ICD-10-CM | POA: Diagnosis not present

## 2019-10-30 DIAGNOSIS — K219 Gastro-esophageal reflux disease without esophagitis: Secondary | ICD-10-CM | POA: Diagnosis not present

## 2019-10-30 DIAGNOSIS — N1831 Chronic kidney disease, stage 3a: Secondary | ICD-10-CM | POA: Diagnosis not present

## 2019-11-03 DIAGNOSIS — K219 Gastro-esophageal reflux disease without esophagitis: Secondary | ICD-10-CM | POA: Diagnosis not present

## 2019-11-03 DIAGNOSIS — E1122 Type 2 diabetes mellitus with diabetic chronic kidney disease: Secondary | ICD-10-CM | POA: Diagnosis not present

## 2019-11-03 DIAGNOSIS — J449 Chronic obstructive pulmonary disease, unspecified: Secondary | ICD-10-CM | POA: Diagnosis not present

## 2019-11-03 DIAGNOSIS — N1831 Chronic kidney disease, stage 3a: Secondary | ICD-10-CM | POA: Diagnosis not present

## 2019-11-03 DIAGNOSIS — E1136 Type 2 diabetes mellitus with diabetic cataract: Secondary | ICD-10-CM | POA: Diagnosis not present

## 2019-11-03 DIAGNOSIS — I129 Hypertensive chronic kidney disease with stage 1 through stage 4 chronic kidney disease, or unspecified chronic kidney disease: Secondary | ICD-10-CM | POA: Diagnosis not present

## 2019-11-04 DIAGNOSIS — E1122 Type 2 diabetes mellitus with diabetic chronic kidney disease: Secondary | ICD-10-CM | POA: Diagnosis not present

## 2019-11-04 DIAGNOSIS — Z794 Long term (current) use of insulin: Secondary | ICD-10-CM | POA: Diagnosis not present

## 2019-11-04 DIAGNOSIS — E538 Deficiency of other specified B group vitamins: Secondary | ICD-10-CM | POA: Diagnosis not present

## 2019-11-04 DIAGNOSIS — Z792 Long term (current) use of antibiotics: Secondary | ICD-10-CM | POA: Diagnosis not present

## 2019-11-04 DIAGNOSIS — J45909 Unspecified asthma, uncomplicated: Secondary | ICD-10-CM | POA: Diagnosis not present

## 2019-11-04 DIAGNOSIS — Z9181 History of falling: Secondary | ICD-10-CM | POA: Diagnosis not present

## 2019-11-04 DIAGNOSIS — Z7951 Long term (current) use of inhaled steroids: Secondary | ICD-10-CM | POA: Diagnosis not present

## 2019-11-04 DIAGNOSIS — H269 Unspecified cataract: Secondary | ICD-10-CM | POA: Diagnosis not present

## 2019-11-04 DIAGNOSIS — Z8616 Personal history of COVID-19: Secondary | ICD-10-CM | POA: Diagnosis not present

## 2019-11-04 DIAGNOSIS — N1831 Chronic kidney disease, stage 3a: Secondary | ICD-10-CM | POA: Diagnosis not present

## 2019-11-04 DIAGNOSIS — K219 Gastro-esophageal reflux disease without esophagitis: Secondary | ICD-10-CM | POA: Diagnosis not present

## 2019-11-04 DIAGNOSIS — E7800 Pure hypercholesterolemia, unspecified: Secondary | ICD-10-CM | POA: Diagnosis not present

## 2019-11-04 DIAGNOSIS — I129 Hypertensive chronic kidney disease with stage 1 through stage 4 chronic kidney disease, or unspecified chronic kidney disease: Secondary | ICD-10-CM | POA: Diagnosis not present

## 2019-11-04 DIAGNOSIS — F329 Major depressive disorder, single episode, unspecified: Secondary | ICD-10-CM | POA: Diagnosis not present

## 2019-11-04 DIAGNOSIS — Z7982 Long term (current) use of aspirin: Secondary | ICD-10-CM | POA: Diagnosis not present

## 2019-11-04 DIAGNOSIS — J449 Chronic obstructive pulmonary disease, unspecified: Secondary | ICD-10-CM | POA: Diagnosis not present

## 2019-11-04 DIAGNOSIS — E1151 Type 2 diabetes mellitus with diabetic peripheral angiopathy without gangrene: Secondary | ICD-10-CM | POA: Diagnosis not present

## 2019-11-04 DIAGNOSIS — J4550 Severe persistent asthma, uncomplicated: Secondary | ICD-10-CM | POA: Diagnosis not present

## 2019-11-04 DIAGNOSIS — M810 Age-related osteoporosis without current pathological fracture: Secondary | ICD-10-CM | POA: Diagnosis not present

## 2019-11-04 DIAGNOSIS — E1136 Type 2 diabetes mellitus with diabetic cataract: Secondary | ICD-10-CM | POA: Diagnosis not present

## 2019-11-09 DIAGNOSIS — I129 Hypertensive chronic kidney disease with stage 1 through stage 4 chronic kidney disease, or unspecified chronic kidney disease: Secondary | ICD-10-CM | POA: Diagnosis not present

## 2019-11-09 DIAGNOSIS — E1136 Type 2 diabetes mellitus with diabetic cataract: Secondary | ICD-10-CM | POA: Diagnosis not present

## 2019-11-09 DIAGNOSIS — E1122 Type 2 diabetes mellitus with diabetic chronic kidney disease: Secondary | ICD-10-CM | POA: Diagnosis not present

## 2019-11-09 DIAGNOSIS — K219 Gastro-esophageal reflux disease without esophagitis: Secondary | ICD-10-CM | POA: Diagnosis not present

## 2019-11-09 DIAGNOSIS — N1831 Chronic kidney disease, stage 3a: Secondary | ICD-10-CM | POA: Diagnosis not present

## 2019-11-09 DIAGNOSIS — J449 Chronic obstructive pulmonary disease, unspecified: Secondary | ICD-10-CM | POA: Diagnosis not present

## 2019-11-13 ENCOUNTER — Ambulatory Visit (INDEPENDENT_AMBULATORY_CARE_PROVIDER_SITE_OTHER): Payer: Medicare Other | Admitting: *Deleted

## 2019-11-13 DIAGNOSIS — I442 Atrioventricular block, complete: Secondary | ICD-10-CM | POA: Diagnosis not present

## 2019-11-13 DIAGNOSIS — E1136 Type 2 diabetes mellitus with diabetic cataract: Secondary | ICD-10-CM | POA: Diagnosis not present

## 2019-11-13 DIAGNOSIS — E1122 Type 2 diabetes mellitus with diabetic chronic kidney disease: Secondary | ICD-10-CM | POA: Diagnosis not present

## 2019-11-13 DIAGNOSIS — N1831 Chronic kidney disease, stage 3a: Secondary | ICD-10-CM | POA: Diagnosis not present

## 2019-11-13 DIAGNOSIS — I129 Hypertensive chronic kidney disease with stage 1 through stage 4 chronic kidney disease, or unspecified chronic kidney disease: Secondary | ICD-10-CM | POA: Diagnosis not present

## 2019-11-13 DIAGNOSIS — K219 Gastro-esophageal reflux disease without esophagitis: Secondary | ICD-10-CM | POA: Diagnosis not present

## 2019-11-13 DIAGNOSIS — J449 Chronic obstructive pulmonary disease, unspecified: Secondary | ICD-10-CM | POA: Diagnosis not present

## 2019-11-15 LAB — CUP PACEART REMOTE DEVICE CHECK
Battery Remaining Longevity: 55 mo
Battery Remaining Percentage: 45 %
Battery Voltage: 2.86 V
Brady Statistic AP VP Percent: 1 %
Brady Statistic AP VS Percent: 1 %
Brady Statistic AS VP Percent: 99 %
Brady Statistic AS VS Percent: 1 %
Brady Statistic RA Percent Paced: 1 %
Brady Statistic RV Percent Paced: 99 %
Date Time Interrogation Session: 20210830020014
Implantable Lead Implant Date: 20140703
Implantable Lead Implant Date: 20140703
Implantable Lead Location: 753859
Implantable Lead Location: 753860
Implantable Pulse Generator Implant Date: 20140703
Lead Channel Impedance Value: 460 Ohm
Lead Channel Impedance Value: 580 Ohm
Lead Channel Pacing Threshold Amplitude: 0.5 V
Lead Channel Pacing Threshold Amplitude: 1.125 V
Lead Channel Pacing Threshold Pulse Width: 0.4 ms
Lead Channel Pacing Threshold Pulse Width: 0.4 ms
Lead Channel Sensing Intrinsic Amplitude: 12 mV
Lead Channel Sensing Intrinsic Amplitude: 2.5 mV
Lead Channel Setting Pacing Amplitude: 1.375
Lead Channel Setting Pacing Amplitude: 2 V
Lead Channel Setting Pacing Pulse Width: 0.4 ms
Lead Channel Setting Sensing Sensitivity: 12.5 mV
Pulse Gen Model: 2240
Pulse Gen Serial Number: 7522008

## 2019-11-17 DIAGNOSIS — J449 Chronic obstructive pulmonary disease, unspecified: Secondary | ICD-10-CM | POA: Diagnosis not present

## 2019-11-17 DIAGNOSIS — N1831 Chronic kidney disease, stage 3a: Secondary | ICD-10-CM | POA: Diagnosis not present

## 2019-11-17 DIAGNOSIS — E1122 Type 2 diabetes mellitus with diabetic chronic kidney disease: Secondary | ICD-10-CM | POA: Diagnosis not present

## 2019-11-17 DIAGNOSIS — K219 Gastro-esophageal reflux disease without esophagitis: Secondary | ICD-10-CM | POA: Diagnosis not present

## 2019-11-17 DIAGNOSIS — E1136 Type 2 diabetes mellitus with diabetic cataract: Secondary | ICD-10-CM | POA: Diagnosis not present

## 2019-11-17 DIAGNOSIS — I129 Hypertensive chronic kidney disease with stage 1 through stage 4 chronic kidney disease, or unspecified chronic kidney disease: Secondary | ICD-10-CM | POA: Diagnosis not present

## 2019-11-18 NOTE — Progress Notes (Signed)
Remote pacemaker transmission.   

## 2019-11-21 ENCOUNTER — Inpatient Hospital Stay (HOSPITAL_COMMUNITY)
Admission: EM | Admit: 2019-11-21 | Discharge: 2019-11-25 | DRG: 536 | Disposition: A | Discharge status: Skilled Nursing Facility | Payer: Medicare Other | Attending: Internal Medicine | Admitting: Internal Medicine

## 2019-11-21 ENCOUNTER — Other Ambulatory Visit: Payer: Self-pay

## 2019-11-21 DIAGNOSIS — R0902 Hypoxemia: Secondary | ICD-10-CM | POA: Diagnosis not present

## 2019-11-21 DIAGNOSIS — S0990XA Unspecified injury of head, initial encounter: Secondary | ICD-10-CM | POA: Diagnosis not present

## 2019-11-21 DIAGNOSIS — J449 Chronic obstructive pulmonary disease, unspecified: Secondary | ICD-10-CM | POA: Diagnosis present

## 2019-11-21 DIAGNOSIS — L8990 Pressure ulcer of unspecified site, unspecified stage: Secondary | ICD-10-CM | POA: Insufficient documentation

## 2019-11-21 DIAGNOSIS — Z7983 Long term (current) use of bisphosphonates: Secondary | ICD-10-CM

## 2019-11-21 DIAGNOSIS — F329 Major depressive disorder, single episode, unspecified: Secondary | ICD-10-CM | POA: Diagnosis present

## 2019-11-21 DIAGNOSIS — Z79899 Other long term (current) drug therapy: Secondary | ICD-10-CM

## 2019-11-21 DIAGNOSIS — I10 Essential (primary) hypertension: Secondary | ICD-10-CM | POA: Diagnosis not present

## 2019-11-21 DIAGNOSIS — I442 Atrioventricular block, complete: Secondary | ICD-10-CM | POA: Diagnosis present

## 2019-11-21 DIAGNOSIS — Z803 Family history of malignant neoplasm of breast: Secondary | ICD-10-CM

## 2019-11-21 DIAGNOSIS — Z950 Presence of cardiac pacemaker: Secondary | ICD-10-CM

## 2019-11-21 DIAGNOSIS — I672 Cerebral atherosclerosis: Secondary | ICD-10-CM | POA: Diagnosis not present

## 2019-11-21 DIAGNOSIS — H409 Unspecified glaucoma: Secondary | ICD-10-CM | POA: Diagnosis present

## 2019-11-21 DIAGNOSIS — Z792 Long term (current) use of antibiotics: Secondary | ICD-10-CM

## 2019-11-21 DIAGNOSIS — Z8249 Family history of ischemic heart disease and other diseases of the circulatory system: Secondary | ICD-10-CM

## 2019-11-21 DIAGNOSIS — R52 Pain, unspecified: Secondary | ICD-10-CM | POA: Diagnosis not present

## 2019-11-21 DIAGNOSIS — Z90710 Acquired absence of both cervix and uterus: Secondary | ICD-10-CM

## 2019-11-21 DIAGNOSIS — Y9301 Activity, walking, marching and hiking: Secondary | ICD-10-CM | POA: Diagnosis present

## 2019-11-21 DIAGNOSIS — S32511A Fracture of superior rim of right pubis, initial encounter for closed fracture: Principal | ICD-10-CM | POA: Diagnosis present

## 2019-11-21 DIAGNOSIS — R110 Nausea: Secondary | ICD-10-CM | POA: Diagnosis not present

## 2019-11-21 DIAGNOSIS — Y92009 Unspecified place in unspecified non-institutional (private) residence as the place of occurrence of the external cause: Secondary | ICD-10-CM

## 2019-11-21 DIAGNOSIS — E785 Hyperlipidemia, unspecified: Secondary | ICD-10-CM | POA: Diagnosis present

## 2019-11-21 DIAGNOSIS — G319 Degenerative disease of nervous system, unspecified: Secondary | ICD-10-CM | POA: Diagnosis not present

## 2019-11-21 DIAGNOSIS — Z9181 History of falling: Secondary | ICD-10-CM

## 2019-11-21 DIAGNOSIS — Z841 Family history of disorders of kidney and ureter: Secondary | ICD-10-CM

## 2019-11-21 DIAGNOSIS — M25551 Pain in right hip: Secondary | ICD-10-CM | POA: Diagnosis not present

## 2019-11-21 DIAGNOSIS — Z808 Family history of malignant neoplasm of other organs or systems: Secondary | ICD-10-CM

## 2019-11-21 DIAGNOSIS — S32591A Other specified fracture of right pubis, initial encounter for closed fracture: Secondary | ICD-10-CM | POA: Diagnosis not present

## 2019-11-21 DIAGNOSIS — E1165 Type 2 diabetes mellitus with hyperglycemia: Secondary | ICD-10-CM

## 2019-11-21 DIAGNOSIS — Z8616 Personal history of COVID-19: Secondary | ICD-10-CM | POA: Diagnosis not present

## 2019-11-21 DIAGNOSIS — Z7982 Long term (current) use of aspirin: Secondary | ICD-10-CM

## 2019-11-21 DIAGNOSIS — W010XXA Fall on same level from slipping, tripping and stumbling without subsequent striking against object, initial encounter: Secondary | ICD-10-CM | POA: Diagnosis present

## 2019-11-21 DIAGNOSIS — Z8674 Personal history of sudden cardiac arrest: Secondary | ICD-10-CM

## 2019-11-21 DIAGNOSIS — E11649 Type 2 diabetes mellitus with hypoglycemia without coma: Secondary | ICD-10-CM | POA: Diagnosis not present

## 2019-11-21 DIAGNOSIS — L89301 Pressure ulcer of unspecified buttock, stage 1: Secondary | ICD-10-CM | POA: Diagnosis present

## 2019-11-21 DIAGNOSIS — Z66 Do not resuscitate: Secondary | ICD-10-CM | POA: Diagnosis present

## 2019-11-21 DIAGNOSIS — M533 Sacrococcygeal disorders, not elsewhere classified: Secondary | ICD-10-CM | POA: Diagnosis not present

## 2019-11-21 DIAGNOSIS — S13150A Subluxation of C4/C5 cervical vertebrae, initial encounter: Secondary | ICD-10-CM | POA: Diagnosis not present

## 2019-11-21 DIAGNOSIS — M542 Cervicalgia: Secondary | ICD-10-CM | POA: Diagnosis not present

## 2019-11-21 DIAGNOSIS — S72009A Fracture of unspecified part of neck of unspecified femur, initial encounter for closed fracture: Secondary | ICD-10-CM | POA: Diagnosis not present

## 2019-11-21 DIAGNOSIS — M810 Age-related osteoporosis without current pathological fracture: Secondary | ICD-10-CM | POA: Diagnosis present

## 2019-11-21 DIAGNOSIS — I495 Sick sinus syndrome: Secondary | ICD-10-CM | POA: Diagnosis present

## 2019-11-21 DIAGNOSIS — Z794 Long term (current) use of insulin: Secondary | ICD-10-CM

## 2019-11-21 DIAGNOSIS — K219 Gastro-esophageal reflux disease without esophagitis: Secondary | ICD-10-CM | POA: Diagnosis not present

## 2019-11-21 DIAGNOSIS — S13140A Subluxation of C3/C4 cervical vertebrae, initial encounter: Secondary | ICD-10-CM | POA: Diagnosis not present

## 2019-11-21 DIAGNOSIS — M47812 Spondylosis without myelopathy or radiculopathy, cervical region: Secondary | ICD-10-CM | POA: Diagnosis not present

## 2019-11-21 DIAGNOSIS — S199XXA Unspecified injury of neck, initial encounter: Secondary | ICD-10-CM | POA: Diagnosis not present

## 2019-11-21 DIAGNOSIS — D649 Anemia, unspecified: Secondary | ICD-10-CM | POA: Diagnosis present

## 2019-11-21 DIAGNOSIS — G629 Polyneuropathy, unspecified: Secondary | ICD-10-CM | POA: Diagnosis present

## 2019-11-21 DIAGNOSIS — R54 Age-related physical debility: Secondary | ICD-10-CM | POA: Diagnosis present

## 2019-11-21 DIAGNOSIS — Y92099 Unspecified place in other non-institutional residence as the place of occurrence of the external cause: Secondary | ICD-10-CM

## 2019-11-21 DIAGNOSIS — W19XXXA Unspecified fall, initial encounter: Secondary | ICD-10-CM | POA: Diagnosis not present

## 2019-11-21 DIAGNOSIS — S329XXA Fracture of unspecified parts of lumbosacral spine and pelvis, initial encounter for closed fracture: Secondary | ICD-10-CM | POA: Diagnosis not present

## 2019-11-21 DIAGNOSIS — Z87440 Personal history of urinary (tract) infections: Secondary | ICD-10-CM

## 2019-11-22 ENCOUNTER — Other Ambulatory Visit: Payer: Self-pay

## 2019-11-22 ENCOUNTER — Emergency Department (HOSPITAL_COMMUNITY): Payer: Medicare Other

## 2019-11-22 DIAGNOSIS — Z950 Presence of cardiac pacemaker: Secondary | ICD-10-CM | POA: Diagnosis not present

## 2019-11-22 DIAGNOSIS — Z8249 Family history of ischemic heart disease and other diseases of the circulatory system: Secondary | ICD-10-CM | POA: Diagnosis not present

## 2019-11-22 DIAGNOSIS — I959 Hypotension, unspecified: Secondary | ICD-10-CM | POA: Diagnosis not present

## 2019-11-22 DIAGNOSIS — M47812 Spondylosis without myelopathy or radiculopathy, cervical region: Secondary | ICD-10-CM | POA: Diagnosis not present

## 2019-11-22 DIAGNOSIS — I672 Cerebral atherosclerosis: Secondary | ICD-10-CM | POA: Diagnosis not present

## 2019-11-22 DIAGNOSIS — S72009A Fracture of unspecified part of neck of unspecified femur, initial encounter for closed fracture: Secondary | ICD-10-CM | POA: Diagnosis not present

## 2019-11-22 DIAGNOSIS — Y92009 Unspecified place in unspecified non-institutional (private) residence as the place of occurrence of the external cause: Secondary | ICD-10-CM

## 2019-11-22 DIAGNOSIS — R2681 Unsteadiness on feet: Secondary | ICD-10-CM | POA: Diagnosis not present

## 2019-11-22 DIAGNOSIS — Z794 Long term (current) use of insulin: Secondary | ICD-10-CM | POA: Diagnosis not present

## 2019-11-22 DIAGNOSIS — G319 Degenerative disease of nervous system, unspecified: Secondary | ICD-10-CM | POA: Diagnosis not present

## 2019-11-22 DIAGNOSIS — Y9301 Activity, walking, marching and hiking: Secondary | ICD-10-CM | POA: Diagnosis present

## 2019-11-22 DIAGNOSIS — Z8616 Personal history of COVID-19: Secondary | ICD-10-CM | POA: Diagnosis not present

## 2019-11-22 DIAGNOSIS — Z7982 Long term (current) use of aspirin: Secondary | ICD-10-CM | POA: Diagnosis not present

## 2019-11-22 DIAGNOSIS — I442 Atrioventricular block, complete: Secondary | ICD-10-CM | POA: Diagnosis present

## 2019-11-22 DIAGNOSIS — S0990XA Unspecified injury of head, initial encounter: Secondary | ICD-10-CM | POA: Diagnosis not present

## 2019-11-22 DIAGNOSIS — S13150A Subluxation of C4/C5 cervical vertebrae, initial encounter: Secondary | ICD-10-CM | POA: Diagnosis not present

## 2019-11-22 DIAGNOSIS — M810 Age-related osteoporosis without current pathological fracture: Secondary | ICD-10-CM | POA: Diagnosis present

## 2019-11-22 DIAGNOSIS — E785 Hyperlipidemia, unspecified: Secondary | ICD-10-CM | POA: Diagnosis present

## 2019-11-22 DIAGNOSIS — I495 Sick sinus syndrome: Secondary | ICD-10-CM | POA: Diagnosis present

## 2019-11-22 DIAGNOSIS — S32511A Fracture of superior rim of right pubis, initial encounter for closed fracture: Secondary | ICD-10-CM | POA: Diagnosis present

## 2019-11-22 DIAGNOSIS — W010XXA Fall on same level from slipping, tripping and stumbling without subsequent striking against object, initial encounter: Secondary | ICD-10-CM | POA: Diagnosis present

## 2019-11-22 DIAGNOSIS — E11649 Type 2 diabetes mellitus with hypoglycemia without coma: Secondary | ICD-10-CM | POA: Diagnosis not present

## 2019-11-22 DIAGNOSIS — I10 Essential (primary) hypertension: Secondary | ICD-10-CM | POA: Diagnosis present

## 2019-11-22 DIAGNOSIS — S199XXA Unspecified injury of neck, initial encounter: Secondary | ICD-10-CM | POA: Diagnosis not present

## 2019-11-22 DIAGNOSIS — G8929 Other chronic pain: Secondary | ICD-10-CM | POA: Diagnosis not present

## 2019-11-22 DIAGNOSIS — I69828 Other speech and language deficits following other cerebrovascular disease: Secondary | ICD-10-CM | POA: Diagnosis not present

## 2019-11-22 DIAGNOSIS — J449 Chronic obstructive pulmonary disease, unspecified: Secondary | ICD-10-CM | POA: Diagnosis present

## 2019-11-22 DIAGNOSIS — J069 Acute upper respiratory infection, unspecified: Secondary | ICD-10-CM | POA: Diagnosis not present

## 2019-11-22 DIAGNOSIS — G629 Polyneuropathy, unspecified: Secondary | ICD-10-CM | POA: Diagnosis present

## 2019-11-22 DIAGNOSIS — Z9181 History of falling: Secondary | ICD-10-CM | POA: Diagnosis not present

## 2019-11-22 DIAGNOSIS — M6281 Muscle weakness (generalized): Secondary | ICD-10-CM | POA: Diagnosis not present

## 2019-11-22 DIAGNOSIS — L89301 Pressure ulcer of unspecified buttock, stage 1: Secondary | ICD-10-CM | POA: Diagnosis present

## 2019-11-22 DIAGNOSIS — Y92099 Unspecified place in other non-institutional residence as the place of occurrence of the external cause: Secondary | ICD-10-CM | POA: Diagnosis not present

## 2019-11-22 DIAGNOSIS — E119 Type 2 diabetes mellitus without complications: Secondary | ICD-10-CM | POA: Diagnosis not present

## 2019-11-22 DIAGNOSIS — S72001D Fracture of unspecified part of neck of right femur, subsequent encounter for closed fracture with routine healing: Secondary | ICD-10-CM | POA: Diagnosis not present

## 2019-11-22 DIAGNOSIS — M533 Sacrococcygeal disorders, not elsewhere classified: Secondary | ICD-10-CM | POA: Diagnosis not present

## 2019-11-22 DIAGNOSIS — F329 Major depressive disorder, single episode, unspecified: Secondary | ICD-10-CM | POA: Diagnosis present

## 2019-11-22 DIAGNOSIS — R54 Age-related physical debility: Secondary | ICD-10-CM | POA: Diagnosis present

## 2019-11-22 DIAGNOSIS — Z841 Family history of disorders of kidney and ureter: Secondary | ICD-10-CM | POA: Diagnosis not present

## 2019-11-22 DIAGNOSIS — M25551 Pain in right hip: Secondary | ICD-10-CM | POA: Diagnosis not present

## 2019-11-22 DIAGNOSIS — E1165 Type 2 diabetes mellitus with hyperglycemia: Secondary | ICD-10-CM | POA: Diagnosis not present

## 2019-11-22 DIAGNOSIS — M2550 Pain in unspecified joint: Secondary | ICD-10-CM | POA: Diagnosis not present

## 2019-11-22 DIAGNOSIS — S32591A Other specified fracture of right pubis, initial encounter for closed fracture: Secondary | ICD-10-CM | POA: Diagnosis not present

## 2019-11-22 DIAGNOSIS — W19XXXA Unspecified fall, initial encounter: Secondary | ICD-10-CM | POA: Diagnosis not present

## 2019-11-22 DIAGNOSIS — R110 Nausea: Secondary | ICD-10-CM | POA: Diagnosis not present

## 2019-11-22 DIAGNOSIS — R2689 Other abnormalities of gait and mobility: Secondary | ICD-10-CM | POA: Diagnosis not present

## 2019-11-22 DIAGNOSIS — Z87440 Personal history of urinary (tract) infections: Secondary | ICD-10-CM | POA: Diagnosis not present

## 2019-11-22 DIAGNOSIS — H409 Unspecified glaucoma: Secondary | ICD-10-CM | POA: Diagnosis present

## 2019-11-22 DIAGNOSIS — M542 Cervicalgia: Secondary | ICD-10-CM | POA: Diagnosis not present

## 2019-11-22 DIAGNOSIS — D649 Anemia, unspecified: Secondary | ICD-10-CM | POA: Diagnosis present

## 2019-11-22 DIAGNOSIS — S13140A Subluxation of C3/C4 cervical vertebrae, initial encounter: Secondary | ICD-10-CM | POA: Diagnosis not present

## 2019-11-22 DIAGNOSIS — Z66 Do not resuscitate: Secondary | ICD-10-CM | POA: Diagnosis present

## 2019-11-22 DIAGNOSIS — Z7401 Bed confinement status: Secondary | ICD-10-CM | POA: Diagnosis not present

## 2019-11-22 DIAGNOSIS — Z8674 Personal history of sudden cardiac arrest: Secondary | ICD-10-CM | POA: Diagnosis not present

## 2019-11-22 DIAGNOSIS — K219 Gastro-esophageal reflux disease without esophagitis: Secondary | ICD-10-CM | POA: Diagnosis present

## 2019-11-22 DIAGNOSIS — R41841 Cognitive communication deficit: Secondary | ICD-10-CM | POA: Diagnosis not present

## 2019-11-22 LAB — CBC WITH DIFFERENTIAL/PLATELET
Abs Immature Granulocytes: 0.06 10*3/uL (ref 0.00–0.07)
Basophils Absolute: 0 10*3/uL (ref 0.0–0.1)
Basophils Relative: 0 %
Eosinophils Absolute: 0.3 10*3/uL (ref 0.0–0.5)
Eosinophils Relative: 3 %
HCT: 31.9 % — ABNORMAL LOW (ref 36.0–46.0)
Hemoglobin: 10.1 g/dL — ABNORMAL LOW (ref 12.0–15.0)
Immature Granulocytes: 1 %
Lymphocytes Relative: 19 %
Lymphs Abs: 1.7 10*3/uL (ref 0.7–4.0)
MCH: 28.2 pg (ref 26.0–34.0)
MCHC: 31.7 g/dL (ref 30.0–36.0)
MCV: 89.1 fL (ref 80.0–100.0)
Monocytes Absolute: 0.4 10*3/uL (ref 0.1–1.0)
Monocytes Relative: 4 %
Neutro Abs: 6.4 10*3/uL (ref 1.7–7.7)
Neutrophils Relative %: 73 %
Platelets: 222 10*3/uL (ref 150–400)
RBC: 3.58 MIL/uL — ABNORMAL LOW (ref 3.87–5.11)
RDW: 14.7 % (ref 11.5–15.5)
WBC: 8.9 10*3/uL (ref 4.0–10.5)
nRBC: 0 % (ref 0.0–0.2)

## 2019-11-22 LAB — CBG MONITORING, ED: Glucose-Capillary: 220 mg/dL — ABNORMAL HIGH (ref 70–99)

## 2019-11-22 LAB — GLUCOSE, CAPILLARY
Glucose-Capillary: 59 mg/dL — ABNORMAL LOW (ref 70–99)
Glucose-Capillary: 85 mg/dL (ref 70–99)

## 2019-11-22 LAB — BASIC METABOLIC PANEL
Anion gap: 8 (ref 5–15)
BUN: 26 mg/dL — ABNORMAL HIGH (ref 8–23)
CO2: 29 mmol/L (ref 22–32)
Calcium: 9 mg/dL (ref 8.9–10.3)
Chloride: 101 mmol/L (ref 98–111)
Creatinine, Ser: 0.68 mg/dL (ref 0.44–1.00)
GFR calc Af Amer: >60 mL/min (ref >60)
GFR calc non Af Amer: >60 mL/min (ref >60)
Glucose, Bld: 175 mg/dL — ABNORMAL HIGH (ref 70–99)
Potassium: 4.3 mmol/L (ref 3.5–5.1)
Sodium: 138 mmol/L (ref 135–145)

## 2019-11-22 LAB — CBC
HCT: 31.3 % — ABNORMAL LOW (ref 36.0–46.0)
Hemoglobin: 9.7 g/dL — ABNORMAL LOW (ref 12.0–15.0)
MCH: 28.4 pg (ref 26.0–34.0)
MCHC: 31 g/dL (ref 30.0–36.0)
MCV: 91.5 fL (ref 80.0–100.0)
Platelets: 205 10*3/uL (ref 150–400)
RBC: 3.42 MIL/uL — ABNORMAL LOW (ref 3.87–5.11)
RDW: 15.2 % (ref 11.5–15.5)
WBC: 9.6 10*3/uL (ref 4.0–10.5)
nRBC: 0 % (ref 0.0–0.2)

## 2019-11-22 LAB — CREATININE, SERUM
Creatinine, Ser: 0.75 mg/dL (ref 0.44–1.00)
GFR calc Af Amer: >60 mL/min (ref >60)
GFR calc non Af Amer: >60 mL/min (ref >60)

## 2019-11-22 LAB — SARS CORONAVIRUS 2 BY RT PCR (HOSPITAL ORDER, PERFORMED IN ~~LOC~~ HOSPITAL LAB): SARS Coronavirus 2: NEGATIVE

## 2019-11-22 MED ORDER — ENOXAPARIN SODIUM 40 MG/0.4ML ~~LOC~~ SOLN
40.0000 mg | SUBCUTANEOUS | Status: DC
  Administered 2019-11-22: 40 mg via SUBCUTANEOUS
  Filled 2019-11-22 (×2): qty 0.4

## 2019-11-22 MED ORDER — MIRABEGRON ER 25 MG PO TB24
25.0000 mg | ORAL_TABLET | Freq: Every day | ORAL | Status: DC
  Administered 2019-11-22 – 2019-11-25 (×4): 25 mg via ORAL
  Filled 2019-11-22 (×4): qty 1

## 2019-11-22 MED ORDER — METFORMIN HCL 500 MG PO TABS
1000.0000 mg | ORAL_TABLET | Freq: Two times a day (BID) | ORAL | Status: DC
  Administered 2019-11-22 – 2019-11-23 (×3): 1000 mg via ORAL
  Filled 2019-11-22 (×3): qty 2

## 2019-11-22 MED ORDER — HYDROCODONE-ACETAMINOPHEN 5-325 MG PO TABS: 1.0000 | ORAL_TABLET | Freq: Four times a day (QID) | ORAL | Status: DC | PRN

## 2019-11-22 MED ORDER — TRAMADOL HCL 50 MG PO TABS: 50.0000 mg | ORAL_TABLET | Freq: Three times a day (TID) | ORAL | Status: DC | PRN

## 2019-11-22 MED ORDER — PANTOPRAZOLE SODIUM 40 MG PO TBEC
40.0000 mg | DELAYED_RELEASE_TABLET | Freq: Every day | ORAL | Status: DC
  Administered 2019-11-22 – 2019-11-25 (×4): 40 mg via ORAL
  Filled 2019-11-22 (×4): qty 1

## 2019-11-22 MED ORDER — VITAMIN B-12 1000 MCG PO TABS
1000.0000 ug | ORAL_TABLET | Freq: Every day | ORAL | Status: DC
  Administered 2019-11-22 – 2019-11-25 (×4): 1000 ug via ORAL
  Filled 2019-11-22 (×4): qty 1

## 2019-11-22 MED ORDER — ACETAMINOPHEN 500 MG PO TABS
1000.0000 mg | ORAL_TABLET | Freq: Four times a day (QID) | ORAL | Status: DC | PRN
  Administered 2019-11-22 – 2019-11-24 (×3): 1000 mg via ORAL
  Filled 2019-11-22 (×3): qty 2

## 2019-11-22 MED ORDER — ASPIRIN EC 81 MG PO TBEC
81.0000 mg | DELAYED_RELEASE_TABLET | Freq: Every day | ORAL | Status: DC
  Administered 2019-11-22 – 2019-11-25 (×4): 81 mg via ORAL
  Filled 2019-11-22 (×4): qty 1

## 2019-11-22 MED ORDER — GABAPENTIN 100 MG PO CAPS
200.0000 mg | ORAL_CAPSULE | Freq: Every day | ORAL | Status: DC
  Administered 2019-11-22 – 2019-11-25 (×4): 200 mg via ORAL
  Filled 2019-11-22 (×4): qty 2

## 2019-11-22 MED ORDER — GABAPENTIN 100 MG PO CAPS
100.0000 mg | ORAL_CAPSULE | Freq: Every day | ORAL | Status: DC
  Administered 2019-11-22 – 2019-11-24 (×4): 100 mg via ORAL
  Filled 2019-11-22 (×4): qty 1

## 2019-11-22 MED ORDER — HYDROCODONE-ACETAMINOPHEN 5-325 MG PO TABS: 1.0000 | ORAL_TABLET | Freq: Once | ORAL | Status: DC

## 2019-11-22 MED ORDER — SERTRALINE HCL 50 MG PO TABS
50.0000 mg | ORAL_TABLET | Freq: Two times a day (BID) | ORAL | Status: DC
  Administered 2019-11-22 – 2019-11-25 (×8): 50 mg via ORAL
  Filled 2019-11-22 (×8): qty 1

## 2019-11-22 MED ORDER — IPRATROPIUM-ALBUTEROL 0.5-2.5 (3) MG/3ML IN SOLN: 3.0000 mL | Freq: Three times a day (TID) | RESPIRATORY_TRACT | Status: DC | PRN

## 2019-11-22 MED ORDER — HYDROCODONE-ACETAMINOPHEN 5-325 MG PO TABS
1.0000 | ORAL_TABLET | ORAL | Status: DC | PRN
  Administered 2019-11-22 – 2019-11-24 (×6): 2 via ORAL
  Filled 2019-11-22 (×6): qty 2

## 2019-11-22 MED ORDER — FENTANYL CITRATE (PF) 100 MCG/2ML IJ SOLN
50.0000 ug | Freq: Once | INTRAMUSCULAR | Status: AC
  Administered 2019-11-22: 50 ug via INTRAVENOUS
  Filled 2019-11-22: qty 2

## 2019-11-22 MED ORDER — SENNOSIDES-DOCUSATE SODIUM 8.6-50 MG PO TABS: 1.0000 | ORAL_TABLET | Freq: Every evening | ORAL | Status: DC | PRN

## 2019-11-22 MED ORDER — TRIMETHOPRIM 100 MG PO TABS
100.0000 mg | ORAL_TABLET | Freq: Every day | ORAL | Status: DC
  Administered 2019-11-22 – 2019-11-25 (×4): 100 mg via ORAL
  Filled 2019-11-22 (×4): qty 1

## 2019-11-22 MED ORDER — INSULIN GLARGINE 100 UNIT/ML ~~LOC~~ SOLN
12.0000 [IU] | Freq: Every day | SUBCUTANEOUS | Status: DC
  Administered 2019-11-22: 12 [IU] via SUBCUTANEOUS
  Filled 2019-11-22 (×2): qty 0.12

## 2019-11-22 MED ORDER — MOMETASONE FURO-FORMOTEROL FUM 200-5 MCG/ACT IN AERO
2.0000 | INHALATION_SPRAY | Freq: Two times a day (BID) | RESPIRATORY_TRACT | Status: DC
  Administered 2019-11-22 – 2019-11-25 (×5): 2 via RESPIRATORY_TRACT
  Filled 2019-11-22 (×2): qty 8.8

## 2019-11-22 MED ORDER — ALBUTEROL SULFATE (2.5 MG/3ML) 0.083% IN NEBU: 2.5000 mg | INHALATION_SOLUTION | Freq: Four times a day (QID) | RESPIRATORY_TRACT | Status: DC | PRN

## 2019-11-22 NOTE — H&P (Signed)
HPI  Wendy Velasquez HEN:[id number] DOB: [birth year] DOA: 11/21/2019  PCP: [name], MD   Chief Complaint: Fall pain weakness on right side  HPI:  86 year old Caucasian [facility]-previously living independently at [place] [facility] until 2 months ago when she started having more falls and was transferred to assisted living Cardiac arrest resulting from complete heart block status post pacemaker dual-chamber [facility] Jude placement 09/11/12,  prior admissions 2016 for pneumonia DM TY 2 Reflux Frequent urinary tract infections on suppressive trimethoprim Depression COPD Osteoporosis Recovered from coronavirus infection after admission 04/04/2019 through 04/23/2019  Ambulated with walker and fell there hit head right hip unable to bear weight. Experienced immediate pain and was brought into the hospital 11/22/2019 She is unable to walk or ambulate This was an unprovoked fall she did not feel dizzy she just ambulated without her walker and fell Placement have been sought but because patient was in severe pain and cannot be evaluated in a timely fashion patient was brought in for observation  Review of Systems:  As above  Pertinent +'s: Fall, weakness, severe pain right hip unable to move Pertinent -"s: - Chills - fever - cough -: - Nausea medicine vomiting - dark stool - hematemesis - dysuria - unilateral weakness - seizure  ED Course: Patient was given Tylenol When evaluated again by emergency room physician she was in severe pain and was given IV fentanyl Multiple home meds were resumed   Past Medical History:  Diagnosis Date  . Asthma   . Cardiac pacemaker in situ   . Complete heart block ([place])   . COPD (chronic obstructive pulmonary disease) ([place])   . Diverticulosis of colon   . GERD (gastroesophageal reflux disease)   . Glaucoma of both eyes   . Heart disease   . History of cardiac arrest    09-11-2012--  symptomatic complete heart block -- hr  20's--  cpr, intubated and temp. pacer until  perm. pacemaker placement  . History of esophageal dilatation    for stricture 2007  . History of hiatal hernia   . History of iron deficiency anemia    hx transfusion's 15 yrs ago , approx 2001  . Hyperlipidemia   . Hypertension   . Neck pain   . Peripheral neuropathy   . Tachy-brady syndrome ([place])   . Type 2 diabetes mellitus ([place])   . Urgency incontinence    Botox injections for bladder.  . Wears glasses    Past Surgical History:  Procedure Laterality Date  . ABDOMINAL HYSTERECTOMY  1976  approx   w/ unilateral salpingoophorectomy  . CATARACT EXTRACTION W/ INTRAOCULAR LENS  IMPLANT, BILATERAL  1990  . COLONOSCOPY WITH ESOPHAGOGASTRODUODENOSCOPY (EGD)  last one 2007  . INTERSTIM IMPLANT PLACEMENT  2012  . INTERSTIM IMPLANT REMOVAL N/A 09/21/2014   Procedure: REMOVAL OF INTERSTIM IMPLANT;  Surgeon: Bjorn Loser, MD;  Location[facility];  Service: Urology;  Laterality: N/A;  . INTERSTIM IMPLANT REVISION N/A 09/21/2014   Procedure: Barrie Lyme STAGE ONE AND TWO;  Surgeon: Bjorn Loser, MD;  Location: [facility];  Service: Urology;  Laterality: N/A;  . LAPAROTOMY W/ UNILATERAL SALPINGOOPHORECTOMY  1960's  approx  . PERMANENT PACEMAKER INSERTION N/A 09/11/2012   Procedure: PERMANENT PACEMAKER INSERTION;  Surgeon: Evans Lance, MD;  Location: [facility] CATH LAB;  Service: Cardiovascular;  Laterality: N/A;  [facility] Jude Dual Chamber  . [place]  approx    reports that she has never  smoked. She has never used smokeless tobacco. She reports that she does not drink alcohol and does not use drugs.  Mobility: Ambulates with a walker typically Raised 4 children used to do odd jobs Never smoker never drinker no other habits  No Known Allergies Family History  Problem Relation Age of Onset  . Other Mother        died during her childbirth  . Prostatitis Father   . Heart failure Sister   . Breast  cancer Sister   . Diabetic kidney disease Sister   . Melanoma Brother    Prior to Admission medications   Medication Sig Start Date End Date Taking? Authorizing Provider  acetaminophen (TYLENOL) 500 MG tablet Take 1,000 mg by mouth every 6 (six) hours as needed for fever.   Yes [provider]  albuterol (PROVENTIL) (2.5 MG/3ML) 0.083% nebulizer solution Take 2.5 mg by nebulization every 6 (six) hours as needed for wheezing or shortness of breath.   Yes [provider]  albuterol-ipratropium (COMBIVENT) 18-103 MCG/ACT inhaler Inhale 2 puffs into the lungs every 8 (eight) hours as needed for wheezing.    Yes [provider]  aspirin EC 81 MG tablet Take 81 mg by mouth daily.   Yes [provider]  botulinum toxin Type A (BOTOX) 100 units SOLR injection Inject 100 Units into the muscle every 3 (three) months.   Yes [provider]  Brinzolamide-Brimonidine (SIMBRINZA) 1-0.2 % SUSP Apply 1 drop to eye at bedtime.   Yes [provider]  cetirizine (ZYRTEC) 10 MG tablet Take 10 mg by mouth daily as needed for allergies.    Yes [provider]  Esomeprazole Magnesium (NEXIUM PO) Take 1 tablet by mouth daily.   Yes [provider]  fluticasone (FLONASE) 50 MCG/ACT nasal spray Place 2 sprays into both nostrils daily as needed for allergies. SHAKE AS DIRECTED    Yes [provider]  Fluticasone-Salmeterol (ADVAIR DISKUS) 250-50 MCG/DOSE AEPB Inhale 1 puff into the lungs 2 (two) times daily.   Yes [provider]  gabapentin (NEURONTIN) 100 MG capsule Take 2 capsules in am, 2 capsules midday, 1 capsule at bedtime. 01/30/18  Yes Marcial Pacas, MD  ibandronate (BONIVA) 150 MG tablet Take 150 mg by mouth every 30 (thirty) days. Take in the morning with a full glass of water, on an empty stomach, and do not take anything else by mouth or lie down for the next 30 min.   Yes [provider]  insulin glargine (LANTUS)  100 UNIT/ML injection Inject 12 Units into the skin at bedtime. 9 pm daily   Yes [provider]  lidocaine-prilocaine (EMLA) cream Apply 1 application topically 4 (four) times daily as needed. Patient taking differently: Apply 1 application topically 4 (four) times daily as needed (pain).  02/26/19  Yes Marcial Pacas, MD  Menthol, Topical Analgesic, (BIOFREEZE) 4 % GEL Apply 1 application topically daily as needed (pain).   Yes [provider]  metFORMIN (GLUCOPHAGE) 1000 MG tablet Take 1,000 mg by mouth 2 (two) times daily with a meal.   Yes [provider]  MYRBETRIQ 25 MG TB24 tablet Take 25 mg by mouth daily.  01/03/18  Yes [provider]  sertraline (ZOLOFT) 50 MG tablet Take 50 mg by mouth 2 (two) times daily.   Yes [provider]  trimethoprim (TRIMPEX) 100 MG tablet Take 1 tablet (100 mg total) by mouth daily. OK TO RESUME AFTER YOU FINISH CURRENT ANTIBIOTIC THERAPY. 03/01/15  Yes Barton Dubois, MD  vitamin B-12 (CYANOCOBALAMIN) 1000 MCG tablet Take 1,000 mcg by mouth daily.   Yes [provider]    Physical Exam:  Vitals:   11/22/19 0628 11/22/19 1324  BP: (!) 113/50   Pulse: 74   Resp: 16   Temp:    SpO2: 97% 94%     Awake coherent quite hard of hearing bilateral cataracts no icterus no pallor neck soft supple no JVD no bruit  S1-S2 pacemaker in place?  Holosystolic murmur  Chest clear no added sound no rales no rhonchi  Abdomen soft nontender no rebound no guarding no organomegaly  Straight leg raise on left side is normal straight leg raise on right is painful and antalgic she has no lower extremity edema  Psych is euthymic congruent although quite sleepy because of the fentanyl given  I have personally reviewed following labs and imaging studies  Labs:   Hemoglobin 10 consistent with prior  BUNs/creatinine 26/0.6  CBGs 170s to 200s   Imaging studies:   CT cervical spine slight subluxation C3-5 no acute  displaced fracture  Hip x-ray performed on admission acute appearing comminuted fractures right superior and inferior pubic rami  Medical tests:   EKG independently reviewed: None performed  Test discussed with performing physician:  Discussed with Dr. Ralene Bathe  Decision to obtain old records:   Reviewed  Review and summation of old records:   Reviewed in detail  Active Problems:   * No active hospital problems. *   Assessment/Plan Sacral insufficiency fractures  Pain control with first choice Tylenol, second choice tramadol, third choice for severe pain oxycodone-attempt to avoid IV narcotics  PT consult in a.m. is weightbearing as tolerated  Education officer, museum has already been working the patient up likely can get to rehab in the next 1 to 2 days depending on pain control Cardiac arrest resulting from complete heart block status post pacemaker dual-chamber [facility] placement 09/11/12,   Not on any beta-blocker or goal-directed therapy with ACE inhibitor  Outpatient management with her cardiologist  Keep on monitors as not completely sure this was not arrhythmia related  May need to interrogate pacemaker if she has any other episodes prior admissions 2016 for pneumonia DM TY 2  Giving Lantus 12 units, continue Metformin 1000 twice daily  As sugars are below 180 with hold sliding scale  A1c goal is above 8 given advanced age Reflux Frequent urinary tract infections on suppressive trimethoprim  Resume suppressive trimethoprim-do not work-up further unless symptomatic she has no white count and is not febrile Depression  Continue sertraline 50 twice daily COPD  Continue albuterol every 6, Flonase, DuoNeb, Dulera Osteoporosis Recovered from coronavirus infection after admission 04/04/2019 through 04/23/2019  Patient still has some myalgias and weird symptoms from her coronavirus infection  This will need to be followed up by her primary physician however she is  stable at this time  Severity of Illness: The appropriate patient status for this patient is INPATIENT. Inpatient status is judged to be reasonable and necessary in order to provide the required intensity of service to ensure the patient's safety. The patient's presenting symptoms, physical exam findings, and initial radiographic and laboratory data in the context of their chronic comorbidities is felt to place them at high risk for further clinical deterioration. Furthermore, it is not anticipated that the patient will be medically stable for discharge from the hospital within 2 midnights of admission. The following factors support the patient status of inpatient.   "  The patient's presenting symptoms include fall pain in right hip which is severe. " The worrisome physical exam findings include comminuted fractures of sacrum. " The initial radiographic and laboratory data are worrisome because of fractures. " The chronic co-morbidities include advanced age diabetes mellitus osteoporosis COPD.   * I certify that at the point of admission it is my clinical judgment that the patient will require inpatient hospital care spanning beyond 2 midnights from the point of admission due to high intensity of service, high risk for further deterioration and high frequency of surveillance required.*    DVT prophylaxis: Lovenox Code Status: DNR confirmed at the bedside with patient and daughter Family Communication: Long discussion with daughter at bedside Consults called: Social worker consulted in the ED none other  Time spent: 40 minutes  Verlon Au, MD Jerl Mina my NP partners at night for Care related issues] [facility] --[employer]ia [employer] [place] , [email or web address]; password [facility]  11/22/2019, 5:16 PM

## 2019-11-22 NOTE — Progress Notes (Signed)
TOC CSW was given permission to send information through hub if [place] does not offer SNF.  looked up PASSAR # for future use if needed.  Pt and pts daughter/[name] were given Medicare.gov packet to look over just in case its needed.  PASSAR #:  [id number]  CSW will continue to follow for dc needs.  Ivie Savitt Tarpley-Carter, MSW, LCSW-A                  [facility] ED Transitions of CareClinical Social Worker [email or web address] [phone]

## 2019-11-22 NOTE — Progress Notes (Signed)
Hypoglycemic Event  CBG: 59  Treatment: 8 oz juice/soda  Symptoms: None  Follow-up CBG: Time: 2111 CBG Result: 85  Possible Reasons for Event: Inadequate meal intake  Comments/MD notified:     Talbert Forest

## 2019-11-22 NOTE — Progress Notes (Signed)
TOC CSW spoke with [name] [name]/pts daughter [phone] and pt about SNF placement.  CSW also provided them with Medicare.gov SNF packet.    Pt is from [place].  Caryl Pina at [facility] stated no one is their to accepted her into SNF at [facility], but someone will be there tomorrow to discuss this with admissions on 11/23/2019 at [phone].  CSW will continue to follow for dc needs.  Michaeljoseph Revolorio Tarpley-Carter, MSW, LCSW-A                  [facility] ED Transitions of CareClinical Social Worker [email or web address] [phone]

## 2019-11-22 NOTE — ED Triage Notes (Signed)
Pt. [facility] by [facility] A[facility] x4 . Was ambulating without walker and fell. Hit her head and right hip. Complaining of right hip pain. No LOC. Unable to bear weight.   138/ 70- 83- 16- 94%  20G [facility] gave 50 mcg fentanyl.

## 2019-11-22 NOTE — Progress Notes (Signed)
TOC CSW consult request has been received. CSW attempting to follow up at present time.   CSW will continue to follow for dc needs.  Trammell Bowden Tarpley-Carter, MSW, LCSW-A                  [facility] ED Transitions of CareClinical Social Worker [email or web address] [phone] 

## 2019-11-22 NOTE — Progress Notes (Signed)
PT Cancellation Note  Patient Details Name: Wendy Velasquez MRN: [id number] DOB: 17-Feb-1926   Cancelled Treatment:    Reason Eval/Treat Not Completed: Patient not medically ready;Other (comment)  It is noted that  Pelvic x-ray shows comminuted fractures of the right superior and inferior pubic ramus. Given this, will need to have [place] status clarified prior to initiating PT eval.  Will  follow and continue efforts to see pt.    [facility] 11/22/2019, 3:52 PM

## 2019-11-22 NOTE — ED Provider Notes (Signed)
[facility].  She has a pelvic fracture.  Initial plan for transfer to SNF pending social work.  Pt with ongoing pain, will consult medicine for obs/pain control/PT pending placement.      Quintella Reichert, MD 11/22/19 [phone]

## 2019-11-22 NOTE — ED Provider Notes (Signed)
[facility] DEPT Provider Note   CSN: [id number] Arrival date & time: 11/21/19  2319   History Chief Complaint  Patient presents with  . Fall    Wendy Velasquez is a 86 y.o. female.  The history is provided by the patient.  She has history of hypertension, hyperlipidemia, COPD, cardiac pacemaker and comes in following a fall at home.  She got up to walk across the room and neglected to use her walker and fell.  She did strike her head on the door but denies loss of consciousness.  She is complaining of pain in the right sacroiliac area.  She was not able to stand and came in by ambulance.  She denies other injury.  She takes daily aspirin but is on no other anticoagulants or antiplatelet agents.  Past Medical History:  Diagnosis Date  . Asthma   . Cardiac pacemaker in situ   . Complete heart block ([place])   . COPD (chronic obstructive pulmonary disease) ([place])   . Diverticulosis of colon   . GERD (gastroesophageal reflux disease)   . Glaucoma of both eyes   . Heart disease   . History of cardiac arrest    09-11-2012--  symptomatic complete heart block -- hr 20's--  cpr, intubated and temp. pacer until  perm. pacemaker placement  . History of esophageal dilatation    for stricture 2007  . History of hiatal hernia   . History of iron deficiency anemia    hx transfusion's 15 yrs ago , approx 2001  . Hyperlipidemia   . Hypertension   . Neck pain   . Peripheral neuropathy   . Tachy-brady syndrome ([place])   . Type 2 diabetes mellitus ([place])   . Urgency incontinence    Botox injections for bladder.  . Wears glasses     Patient Active Problem List   Diagnosis Date Noted  . Acute hypoxemic respiratory failure due to COVID-19 ([place]) 04/23/2019  . Hypoxia 04/17/2019  . Neck pain 04/08/2018  . Cervical dystonia 01/02/2018  . Chronic neck pain 12/05/2017  . Sepsis ([place])   . Type 2 diabetes mellitus with hyperglycemia, with long-term current use of  insulin ([place])   . Esophageal reflux   . COPD exacerbation ([place])   . Depression   . Physical deconditioning   . Severe protein-calorie malnutrition ([place])   . DM2 (diabetes mellitus, type 2) ([place]) 02/25/2015  . CAP (community acquired pneumonia) 02/24/2015  . Anorexia 10/01/2014  . S/P cardiac pacemaker procedure 09/13/2012  . Complete heart block ([place]) 09/11/2012  . Cardiac arrest - due to complete Heart Block, resolved 09/11/2012  . Essential hypertension 09/11/2012  . Hyperlipidemia - on staint 09/11/2012  . COPD (chronic obstructive pulmonary disease) ([place]) 09/11/2012  . Osteoporosis 09/11/2012  . GERD (gastroesophageal reflux disease) 09/11/2012  . Acute respiratory failure - resolved, extubated 11PM 7/3 09/11/2012  . Cardiogenic shock - resolved 09/11/2012    Past Surgical History:  Procedure Laterality Date  . ABDOMINAL HYSTERECTOMY  1976  approx   w/ unilateral salpingoophorectomy  . CATARACT EXTRACTION W/ INTRAOCULAR LENS  IMPLANT, BILATERAL  1990  . COLONOSCOPY WITH ESOPHAGOGASTRODUODENOSCOPY (EGD)  last one 2007  . INTERSTIM IMPLANT PLACEMENT  2012  . INTERSTIM IMPLANT REMOVAL N/A 09/21/2014   Procedure: REMOVAL OF INTERSTIM IMPLANT;  Surgeon: Bjorn Loser, MD;  Location: [facility];  Service: Urology;  Laterality: N/A;  . INTERSTIM IMPLANT REVISION N/A 09/21/2014   Procedure: INTERSTIM STAGE ONE AND TWO;  Surgeon: Bjorn Loser, MD;  Location: [facility];  Service: Urology;  Laterality: N/A;  . LAPAROTOMY W/ UNILATERAL SALPINGOOPHORECTOMY  1960's  approx  . PERMANENT PACEMAKER INSERTION N/A 09/11/2012   Procedure: PERMANENT PACEMAKER INSERTION;  Surgeon: Evans Lance, MD;  Location: [facility] CATH LAB;  Service: Cardiovascular;  Laterality: N/A;  St. Jude Dual Chamber  . [place]  approx     OB History   No obstetric history on file.     Family History  Problem Relation Age of Onset  . Other Mother        died during  her childbirth  . Prostatitis Father   . Heart failure Sister   . Breast cancer Sister   . Diabetic kidney disease Sister   . Melanoma Brother     Social History   Tobacco Use  . Smoking status: Never Smoker  . Smokeless tobacco: Never Used  Vaping Use  . Vaping Use: Never used  Substance Use Topics  . Alcohol use: No  . Drug use: No    Home Medications Prior to Admission medications   Medication Sig Start Date End Date Taking? Authorizing Provider  acetaminophen (TYLENOL) 500 MG tablet Take 1,000 mg by mouth every 6 (six) hours as needed for fever.    [provider]  albuterol (PROVENTIL) (2.5 MG/3ML) 0.083% nebulizer solution Take 2.5 mg by nebulization every 6 (six) hours as needed for wheezing or shortness of breath.    [provider]  albuterol-ipratropium (COMBIVENT) 18-103 MCG/ACT inhaler Inhale 2 puffs into the lungs every 8 (eight) hours as needed for wheezing.     [provider]  aspirin EC 81 MG tablet Take 81 mg by mouth daily.    [provider]  botulinum toxin Type A (BOTOX) 100 units SOLR injection Inject 100 Units into the muscle every 3 (three) months.    [provider]  Brinzolamide-Brimonidine (SIMBRINZA) 1-0.2 % SUSP Apply 1 drop to eye at bedtime.    [provider]  cetirizine (ZYRTEC) 10 MG tablet Take 10 mg by mouth daily as needed for allergies.     [provider]  Esomeprazole Magnesium (NEXIUM PO) Take 1 tablet by mouth daily.    [provider]  fluticasone (FLONASE) 50 MCG/ACT nasal spray Place 2 sprays into both nostrils daily as needed for allergies. SHAKE AS DIRECTED     [provider]  Fluticasone-Salmeterol (ADVAIR DISKUS) 250-50 MCG/DOSE AEPB Inhale 1 puff into the lungs 2 (two) times daily.    [provider]  gabapentin (NEURONTIN) 100 MG capsule Take 2 capsules in am, 2 capsules midday, 1 capsule at bedtime. 01/30/18   Marcial Pacas, MD  ibandronate  (BONIVA) 150 MG tablet Take 150 mg by mouth every 30 (thirty) days. Take in the morning with a full glass of water, on an empty stomach, and do not take anything else by mouth or lie down for the next 30 min.    [provider]  insulin glargine (LANTUS) 100 UNIT/ML injection Inject 12 Units into the skin at bedtime. 9 pm daily    [provider]  lidocaine-prilocaine (EMLA) cream Apply 1 application topically 4 (four) times daily as needed. Patient taking differently: Apply 1 application topically 4 (four) times daily as needed (pain).  02/26/19   Marcial Pacas, MD  Menthol, Topical Analgesic, (BIOFREEZE) 4 % GEL Apply 1 application topically daily as needed (pain).    [provider]  metFORMIN (GLUCOPHAGE)  1000 MG tablet Take 1,000 mg by mouth 2 (two) times daily with a meal.    [provider]  MYRBETRIQ 25 MG TB24 tablet Take 25 mg by mouth daily.  01/03/18   [provider]  sertraline (ZOLOFT) 50 MG tablet Take 50 mg by mouth 2 (two) times daily.    [provider]  trimethoprim (TRIMPEX) 100 MG tablet Take 1 tablet (100 mg total) by mouth daily. OK TO RESUME AFTER YOU FINISH CURRENT ANTIBIOTIC THERAPY. 03/01/15   Barton Dubois, MD  vitamin B-12 (CYANOCOBALAMIN) 1000 MCG tablet Take 1,000 mcg by mouth daily.    [provider]    Allergies    Patient has no known allergies.  Review of Systems   Review of Systems  All other systems reviewed and are negative.   Physical Exam Updated Vital Signs There were no vitals taken for this visit.  Physical Exam Vitals and nursing note reviewed.   86 year old female, resting comfortably and in no acute distress. Vital signs are normal. Oxygen saturation is 97%, which is normal. Head is normocephalic and atraumatic. PERRLA, EOMI. Oropharynx is clear. Neck is immobilized in a stiff cervical collar and is nontender without adenopathy or JVD. Back is nontender and there is no CVA  tenderness. Lungs are clear without rales, wheezes, or rhonchi. Chest is nontender. Heart has regular rate and rhythm without murmur. Abdomen is soft, flat, nontender without masses or hepatosplenomegaly and peristalsis is normoactive. Extremities: She holds her right leg slightly externally rotated but it is not shortened.  There is no pain with rotation of the right hip but there is pain with passive flexion.  There is no tenderness palpation over the right hip but there is mild tenderness palpation in the region of the right posterior iliac crest.  Distal neurovascular exam is intact with strong pulses, prompt capillary refill, normal sensation.  Remainder of extremity exam is normal. Skin is warm and dry without rash. Neurologic: Mental status is normal, cranial nerves are intact, there are no motor or sensory deficits.  ED Results / Procedures / Treatments   Labs (all labs ordered are listed, but only abnormal results are displayed) Labs Reviewed  SARS CORONAVIRUS 2 BY RT PCR ([place])    Radiology CT Head Wo Contrast  Result Date: 11/22/2019 CLINICAL DATA:  Head and neck pain after a fall EXAM: CT HEAD WITHOUT CONTRAST CT CERVICAL SPINE WITHOUT CONTRAST TECHNIQUE: Multidetector CT imaging of the head and cervical spine was performed following the standard protocol without intravenous contrast. Multiplanar CT image reconstructions of the cervical spine were also generated. COMPARISON:  CT cervical spine 12/10/2017 FINDINGS: CT HEAD FINDINGS Brain: Diffuse cerebral atrophy. Ventricular dilatation consistent with central atrophy. Low-attenuation changes in the deep white matter consistent with small vessel ischemia. No abnormal extra-axial fluid collections. No mass effect or midline shift. Gray-white matter junctions are distinct. Basal cisterns are not effaced. No acute intracranial hemorrhage. Vascular: Moderate intracranial arterial vascular  calcifications. Skull: Calvarium appears intact. Sinuses/Orbits: Paranasal sinuses and mastoid air cells are clear. Other: None. CT CERVICAL SPINE FINDINGS Alignment: Slight anterior subluxations demonstrated at C3-4 and C4-5 levels, unchanged since prior study. This is likely degenerative. Normal alignment of the posterior elements. C1-2 articulation appears intact. Skull base and vertebrae: Skull base appears intact. No vertebral compression deformities. No focal bone lesion or bone destruction. Ligamentous calcifications associated with C1-2. Soft tissues and spinal canal: No prevertebral soft tissue swelling. No  abnormal paraspinal soft tissue mass or infiltration. Vascular calcifications. Disc levels: Degenerative changes throughout the cervical spine with narrowed interspaces and endplate hypertrophic change. Degenerative changes are most prominent at C4-5, C5-6, and C6-7 levels. Degenerative changes in the posterior facet joints. Facet joint hypertrophy and uncovertebral spurring causes encroachment of the neural foramina at multiple levels bilaterally. Upper chest: Lung apices are clear. Other: None. IMPRESSION: 1. No acute intracranial abnormalities. Chronic atrophy and small vessel ischemic changes. 2. Slight anterior subluxations at C3-4 and C4-5 levels are unchanged since prior study and likely degenerative. Degenerative changes throughout the cervical spine. No acute displaced fractures identified. Electronically Signed   By: Lucienne Capers M.D.   On: 11/22/2019 00:38   CT Cervical Spine Wo Contrast  Result Date: 11/22/2019 CLINICAL DATA:  Head and neck pain after a fall EXAM: CT HEAD WITHOUT CONTRAST CT CERVICAL SPINE WITHOUT CONTRAST TECHNIQUE: Multidetector CT imaging of the head and cervical spine was performed following the standard protocol without intravenous contrast. Multiplanar CT image reconstructions of the cervical spine were also generated. COMPARISON:  CT cervical spine 12/10/2017  FINDINGS: CT HEAD FINDINGS Brain: Diffuse cerebral atrophy. Ventricular dilatation consistent with central atrophy. Low-attenuation changes in the deep white matter consistent with small vessel ischemia. No abnormal extra-axial fluid collections. No mass effect or midline shift. Gray-white matter junctions are distinct. Basal cisterns are not effaced. No acute intracranial hemorrhage. Vascular: Moderate intracranial arterial vascular calcifications. Skull: Calvarium appears intact. Sinuses/Orbits: Paranasal sinuses and mastoid air cells are clear. Other: None. CT CERVICAL SPINE FINDINGS Alignment: Slight anterior subluxations demonstrated at C3-4 and C4-5 levels, unchanged since prior study. This is likely degenerative. Normal alignment of the posterior elements. C1-2 articulation appears intact. Skull base and vertebrae: Skull base appears intact. No vertebral compression deformities. No focal bone lesion or bone destruction. Ligamentous calcifications associated with C1-2. Soft tissues and spinal canal: No prevertebral soft tissue swelling. No abnormal paraspinal soft tissue mass or infiltration. Vascular calcifications. Disc levels: Degenerative changes throughout the cervical spine with narrowed interspaces and endplate hypertrophic change. Degenerative changes are most prominent at C4-5, C5-6, and C6-7 levels. Degenerative changes in the posterior facet joints. Facet joint hypertrophy and uncovertebral spurring causes encroachment of the neural foramina at multiple levels bilaterally. Upper chest: Lung apices are clear. Other: None. IMPRESSION: 1. No acute intracranial abnormalities. Chronic atrophy and small vessel ischemic changes. 2. Slight anterior subluxations at C3-4 and C4-5 levels are unchanged since prior study and likely degenerative. Degenerative changes throughout the cervical spine. No acute displaced fractures identified. Electronically Signed   By: Lucienne Capers M.D.   On: 11/22/2019 00:38    DG Hip Unilat W or Wo Pelvis 2-3 Views Right  Result Date: 11/22/2019 CLINICAL DATA:  Right hip pain after a fall EXAM: DG HIP (WITH OR WITHOUT PELVIS) 2-3V RIGHT COMPARISON:  None. FINDINGS: Acute appearing comminuted fractures of the right superior and inferior pubic rami with mild displacement. The right hip appears otherwise intact. No evidence of acute fracture or dislocation in the hip joint. Visualized sacrum appears intact. SI joints and symphysis pubis are not displaced. Vascular calcifications. Sacral stimulator device. IMPRESSION: Acute appearing comminuted fractures of the right superior and inferior pubic rami. Electronically Signed   By: Lucienne Capers M.D.   On: 11/22/2019 00:23    Procedures Procedures  Medications Ordered in ED Medications - No data to display  ED Course  I have reviewed the triage vital signs and the nursing notes.  Pertinent labs & imaging  results that were available during my care of the patient were reviewed by me and considered in my medical decision making (see chart for details).  MDM Rules/Calculators/A&P Fall at home with injury to the right hip/pelvis and also minor head injury.  Old records are reviewed, and she has no relevant past visits.  She will be sent for x-rays of right hip and pelvis as well as CT scans of head and cervical spine.  Pelvis x-ray shows fractures of the right superior and inferior pubic ramus.  No evidence of hip fracture.  CT of head and cervical spine showed no acute process.  Patient is not able to ambulate, will need to go to skilled nursing facility until able to ambulate with a walker.  Will request transitions of care consult.  Test for COVID-19 sent.  Final Clinical Impression(s) / ED Diagnoses Final diagnoses:  Fall at home, initial encounter  Closed fracture of right pelvis, initial encounter [facility])    Rx / DC Orders ED Discharge Orders    None       Delora Fuel, MD 17/91/50 [phone]

## 2019-11-22 NOTE — ED Notes (Signed)
Call received from pt daughter Pershing Cox [phone] requesting rtn call for pt status/updates when possible. [employer]

## 2019-11-23 ENCOUNTER — Encounter (HOSPITAL_COMMUNITY): Payer: Self-pay | Admitting: Family Medicine

## 2019-11-23 DIAGNOSIS — L8990 Pressure ulcer of unspecified site, unspecified stage: Secondary | ICD-10-CM | POA: Insufficient documentation

## 2019-11-23 HISTORY — DX: Pressure ulcer of unspecified site, unspecified stage: L89.90

## 2019-11-23 LAB — CBC
HCT: 31.6 % — ABNORMAL LOW (ref 36.0–46.0)
Hemoglobin: 10.1 g/dL — ABNORMAL LOW (ref 12.0–15.0)
MCH: 28.7 pg (ref 26.0–34.0)
MCHC: 32 g/dL (ref 30.0–36.0)
MCV: 89.8 fL (ref 80.0–100.0)
Platelets: 202 10*3/uL (ref 150–400)
RBC: 3.52 MIL/uL — ABNORMAL LOW (ref 3.87–5.11)
RDW: 15.2 % (ref 11.5–15.5)
WBC: 10.2 10*3/uL (ref 4.0–10.5)
nRBC: 0 % (ref 0.0–0.2)

## 2019-11-23 LAB — GLUCOSE, CAPILLARY
Glucose-Capillary: 126 mg/dL — ABNORMAL HIGH (ref 70–99)
Glucose-Capillary: 223 mg/dL — ABNORMAL HIGH (ref 70–99)
Glucose-Capillary: 276 mg/dL — ABNORMAL HIGH (ref 70–99)
Glucose-Capillary: 288 mg/dL — ABNORMAL HIGH (ref 70–99)

## 2019-11-23 LAB — COMPREHENSIVE METABOLIC PANEL
ALT: 15 U/L (ref 0–44)
AST: 14 U/L — ABNORMAL LOW (ref 15–41)
Albumin: 3.1 g/dL — ABNORMAL LOW (ref 3.5–5.0)
Alkaline Phosphatase: 47 U/L (ref 38–126)
Anion gap: 10 (ref 5–15)
BUN: 26 mg/dL — ABNORMAL HIGH (ref 8–23)
CO2: 26 mmol/L (ref 22–32)
Calcium: 8.6 mg/dL — ABNORMAL LOW (ref 8.9–10.3)
Chloride: 101 mmol/L (ref 98–111)
Creatinine, Ser: 0.6 mg/dL (ref 0.44–1.00)
GFR calc Af Amer: >60 mL/min (ref >60)
GFR calc non Af Amer: >60 mL/min (ref >60)
Glucose, Bld: 107 mg/dL — ABNORMAL HIGH (ref 70–99)
Potassium: 4.5 mmol/L (ref 3.5–5.1)
Sodium: 137 mmol/L (ref 135–145)
Total Bilirubin: 0.4 mg/dL (ref 0.3–1.2)
Total Protein: 5.4 g/dL — ABNORMAL LOW (ref 6.5–8.1)

## 2019-11-23 LAB — MRSA PCR SCREENING: MRSA by PCR: NEGATIVE

## 2019-11-23 MED ORDER — ENOXAPARIN SODIUM 40 MG/0.4ML ~~LOC~~ SOLN
40.0000 mg | SUBCUTANEOUS | Status: DC
  Administered 2019-11-23 – 2019-11-24 (×2): 40 mg via SUBCUTANEOUS
  Filled 2019-11-23 (×2): qty 0.4

## 2019-11-23 MED ORDER — IPRATROPIUM-ALBUTEROL 0.5-2.5 (3) MG/3ML IN SOLN: 3.0000 mL | Freq: Three times a day (TID) | RESPIRATORY_TRACT | Status: DC | PRN

## 2019-11-23 MED ORDER — INSULIN GLARGINE 100 UNIT/ML ~~LOC~~ SOLN: 8.0000 [IU] | Freq: Every day | SUBCUTANEOUS | Status: DC

## 2019-11-23 MED ORDER — METFORMIN HCL ER 500 MG PO TB24
500.0000 mg | ORAL_TABLET | Freq: Every day | ORAL | Status: DC
  Administered 2019-11-24: 500 mg via ORAL
  Filled 2019-11-23: qty 1

## 2019-11-23 NOTE — Plan of Care (Signed)
Pain management for patient and increase mobility as tolerated

## 2019-11-23 NOTE — TOC Progression Note (Signed)
Transition of Care [facility]) - Progression Note    Patient Details  Name: Wendy Velasquez MRN: [id number] Date of Birth: 12-27-1925  Transition of Care [facility]) CM/SW Contact  Raymont Andreoni, [name], RN Phone Number: 11/23/2019, 1:49 PM  Clinical Narrative: Patient/dtr [name] agree to SNF-faxed out await bed offers-prefers [facility] & [facility].      Expected Discharge Plan: [place] Barriers to Discharge: Continued Medical Work up  Expected Discharge Plan and Services Expected Discharge Plan: [place] Choice: Nursing Home Living arrangements for the past 2 months: Assisted Living Facility                                       Social Determinants of Health (SDOH) Interventions    Readmission Risk Interventions No flowsheet data found.

## 2019-11-23 NOTE — NC FL2 (Signed)
[place] LEVEL OF CARE SCREENING TOOL     IDENTIFICATION  Patient Name: Wendy Velasquez Birthdate: 02-Sep-1925 Sex: female Admission Date (Current Location): 11/21/2019  [facility] and [place] Number:  Herbalist and Address:  [facility],  [place] [address] [place]      Provider Number: [id number]  Attending Physician Name and Address:  Nita Sells, MD  Relative Name and Phone Number:  Pershing Cox dtr [phone]    Current Level of Care: Hospital Recommended Level of Care: [place] Prior Approval Number:    Date Approved/Denied:   PASRR Number: [id number]  Discharge Plan: SNF    Current Diagnoses: Patient Active Problem List   Diagnosis Date Noted  . Pressure injury of skin 11/23/2019  . Hip fracture ([place]) 11/22/2019  . Acute hypoxemic respiratory failure due to COVID-19 ([place]) 04/23/2019  . Hypoxia 04/17/2019  . Neck pain 04/08/2018  . Cervical dystonia 01/02/2018  . Chronic neck pain 12/05/2017  . Sepsis ([place])   . Type 2 diabetes mellitus with hyperglycemia, with long-term current use of insulin ([place])   . Esophageal reflux   . COPD exacerbation ([place])   . Depression   . Physical deconditioning   . Severe protein-calorie malnutrition ([place])   . DM2 (diabetes mellitus, type 2) ([place]) 02/25/2015  . CAP (community acquired pneumonia) 02/24/2015  . Anorexia 10/01/2014  . S/P cardiac pacemaker procedure 09/13/2012  . Complete heart block ([place]) 09/11/2012  . Cardiac arrest - due to complete Heart Block, resolved 09/11/2012  . Essential hypertension 09/11/2012  . Hyperlipidemia - on staint 09/11/2012  . COPD (chronic obstructive pulmonary disease) ([place]) 09/11/2012  . Osteoporosis 09/11/2012  . GERD (gastroesophageal reflux disease) 09/11/2012  . Acute respiratory failure - resolved, extubated 11PM 7/3 09/11/2012  . Cardiogenic shock - resolved 09/11/2012    Orientation RESPIRATION BLADDER  Height & Weight     Self, Time, Situation, Place  Normal Continent Weight: 55.5 kg Height:  5\' 5"  (165.1 cm)  BEHAVIORAL SYMPTOMS/MOOD NEUROLOGICAL BOWEL NUTRITION STATUS      Continent Diet (CHO MOD)  AMBULATORY STATUS COMMUNICATION OF NEEDS Skin   Limited Assist Verbally Normal                       Personal Care Assistance Level of Assistance  Bathing, Feeding, Dressing Bathing Assistance: Limited assistance Feeding assistance: Limited assistance Dressing Assistance: Limited assistance     Functional Limitations Info  Sight, Hearing, Speech Sight Info: Impaired (eyeglasses) Hearing Info: Adequate Speech Info: Impaired (Partial lowers)    SPECIAL CARE FACTORS FREQUENCY  PT (By licensed PT), OT (By licensed OT)     PT Frequency: 5x week OT Frequency: 5x week            Contractures Contractures Info: Not present    Additional Factors Info  Allergies (COVID+) Code Status Info: NKA Allergies Info:  (NKA)           Current Medications (11/23/2019):  This is the current hospital active medication list Current Facility-Administered Medications  Medication Dose Route Frequency Provider Last Rate Last Admin  . acetaminophen (TYLENOL) tablet 1,000 mg  1,000 mg Oral Q6H PRN Nita Sells, MD   1,000 mg at 11/22/19 1444  . albuterol (PROVENTIL) (2.5 MG/3ML) 0.083% nebulizer solution 2.5 mg  2.5 mg Nebulization Q6H PRN Nita Sells, MD      . aspirin EC tablet 81 mg  81 mg Oral Daily Samtani, Jai-Gurmukh,  MD   81 mg at 11/23/19 0936  . enoxaparin (LOVENOX) injection 40 mg  40 mg Subcutaneous Q24H Samtani, Jai-Gurmukh, MD      . gabapentin (NEURONTIN) capsule 100 mg  100 mg Oral QHS Nita Sells, MD   100 mg at 11/22/19 2248  . gabapentin (NEURONTIN) capsule 200 mg  200 mg Oral QPC breakfast Nita Sells, MD   200 mg at 11/23/19 0934  . HYDROcodone-acetaminophen (NORCO/VICODIN) 5-325 MG per tablet 1-2 tablet  1-2 tablet Oral Q4H PRN  Nita Sells, MD   2 tablet at 11/23/19 0935  . ipratropium-albuterol (DUONEB) 0.5-2.5 (3) MG/3ML nebulizer solution 3 mL  3 mL Inhalation Q8H PRN Nita Sells, MD      . Derrill Memo ON 11/24/2019] metFORMIN (GLUCOPHAGE-XR) 24 hr tablet 500 mg  500 mg Oral Q breakfast Nita Sells, MD      . mirabegron ER (MYRBETRIQ) tablet 25 mg  25 mg Oral Daily Nita Sells, MD   25 mg at 11/23/19 0936  . mometasone-formoterol (DULERA) 200-5 MCG/ACT inhaler 2 puff  2 puff Inhalation BID Nita Sells, MD   2 puff at 11/23/19 1059  . pantoprazole (PROTONIX) EC tablet 40 mg  40 mg Oral Daily Nita Sells, MD   40 mg at 11/23/19 0934  . senna-docusate (Senokot-S) tablet 1 tablet  1 tablet Oral QHS PRN Nita Sells, MD      . sertraline (ZOLOFT) tablet 50 mg  50 mg Oral BID Nita Sells, MD   50 mg at 11/23/19 0934  . traMADol (ULTRAM) tablet 50 mg  50 mg Oral Q8H PRN Nita Sells, MD      . trimethoprim (TRIMPEX) tablet 100 mg  100 mg Oral Daily Nita Sells, MD   100 mg at 11/23/19 0935  . vitamin B-12 (CYANOCOBALAMIN) tablet 1,000 mcg  1,000 mcg Oral Daily Nita Sells, MD   1,000 mcg at 11/23/19 0935     Discharge Medications: Please see discharge summary for a list of discharge medications.  Relevant Imaging Results:  Relevant Lab Results:   Additional Information SS#[id number] [phone], [name], [place]

## 2019-11-23 NOTE — Progress Notes (Addendum)
PROGRESS NOTE    RICKIE GUTIERRES  PJA:[id number] DOB: 08/18/1925 DOA: 11/21/2019 PCP: [name], MD  Brief Narrative:  86 year old Caucasian female resident of [facility]-previously living independently at [place] [facility] until 2 months ago when she started having more falls and was transferred to assisted living Cardiac arrest resulting from complete heart block status post pacemaker dual-chamber [facility] placement 09/11/12,  prior admissions 2016 for pneumonia DM TY 2 Reflux Frequent urinary tract infections on suppressive trimethoprim Depression COPD Osteoporosis Recovered from coronavirus infection after admission 04/04/2019 through 04/23/2019  Ambulated with walker and fell there hit head right hip unable to bear weight. Experienced immediate pain and was brought into the hospital 11/22/2019 She is unable to walk or ambulate  Patient was admitted for pain control given severe pain and inability to walk in the emergency room and will need therapy evaluation   Assessment & Plan:   Active Problems:   Hip fracture (HCC)   Pressure injury of skin   1. Sacral insufficiency fractures a. Therapy continues to work with patient although very nauseous this morning and could not complete working with them b. She will hopefully be cleared with weightbearing and pain controlled in the next day or so, Tylenol first choice oxycodone second choice IV last resort social work was consulted in the ED and floor c. CSW will continue to work and see if we can discharge in the next several days 2. Nausea on standing 3. Tells me that she has had this since she had been diagnosed with Covid a. Probably episodic and related to pain--also tells me that she does not have any issues with swallowing or sticking of food b. Monitor-medicate for nausea as needed 4. Cardiac arrest 09/2012 + [facility]B + PPM [facility] a. Monitor strip showed paced rhythm with PVC therefore discontinued the same b. No  further issues 5. DM TY 2 with complication of hypoglycemia this admission a. Stop all Lantus May continue Metformin 1000 but change to XL daily b. cotninue monitoring for hypoglycemia 6. Normocytic anemia a. No further work-up today follow-up as an outpatient 7. Frequent urinary infections/suppressive trimethoprim a. Continue Bactrim prior to admission dosing outpatient neurology follow-up 8. Depression a. Continue sertraline 50 twice daily 9. COPD Gold stage II a. No wheeze today seems stable b. Continue albuterol every 6 Flonase duo nebs and Dulera 10. Osteoporosis 11. Prior Covid infection 04/01/2019 12. Advanced age and frailty  DVT prophylaxis:  Code Status:  Family Communication: Disposition:   Status is: Inpatient  Remains inpatient appropriate because:IV treatments appropriate due to intensity of illness or inability to take PO   Dispo: The patient is from: ALF              Anticipated d/c is to: SNF              Anticipated d/c date is: 2 days              Patient currently is not medically stable to d/c.       Consultants:   None  Procedures: No  Antimicrobials: None   Subjective: Awake coherent no distress but nauseous with standing this morning and medicated by nursing Oral pain medications treatments primarily being used No chest pain Pain is controlled at rest  Objective: Vitals:   11/22/19 2009 11/22/19 2025 11/23/19 0016 11/23/19 0445  BP: (!) 111/58  106/63 (!) 110/52  Pulse: 77  82 92  Resp: 18  18 18   Temp: 98.1 F (  36.7 C)  97.9 F (36.6 C) 98.4 F (36.9 C)  TempSrc: Oral     SpO2: 93%  93% 93%  Weight:  55.5 kg    Height:  5\' 5"  (1.651 m)      Intake/Output Summary (Last 24 hours) at 11/23/2019 0735 Last data filed at 11/22/2019 2029 Gross per 24 hour  Intake 640 ml  Output --  Net 640 ml   Filed Weights   11/22/19 0206 11/22/19 2025  Weight: 54.4 kg 55.5 kg    Examination:  General exam: Awake coherent pleasant no  distress EOMI NCAT no focal deficit Respiratory system: Clear no added sound Cardiovascular system: S1-S2 no murmur paced rhythm on monitors with few PVCs Gastrointestinal system: Soft supple. Central nervous system: Intact no focal deficits other than pain in hip where she has fracture Extremities:  ROM intact to major muscle groups and to joints Skin: No lower extremity edema Psychiatry: euthymic slght discomfort  Data Reviewed: I have personally reviewed following labs and imaging studies BUNs/creatinine 26/0.6--= same Hypoglycemic overnight Hemoglobin 10.1 WBC 10.2 platelet 202  Radiology Studies: CT Head Wo Contrast  Result Date: 11/22/2019 CLINICAL DATA:  Head and neck pain after a fall EXAM: CT HEAD WITHOUT CONTRAST CT CERVICAL SPINE WITHOUT CONTRAST TECHNIQUE: Multidetector CT imaging of the head and cervical spine was performed following the standard protocol without intravenous contrast. Multiplanar CT image reconstructions of the cervical spine were also generated. COMPARISON:  CT cervical spine 12/10/2017 FINDINGS: CT HEAD FINDINGS Brain: Diffuse cerebral atrophy. Ventricular dilatation consistent with central atrophy. Low-attenuation changes in the deep white matter consistent with small vessel ischemia. No abnormal extra-axial fluid collections. No mass effect or midline shift. Gray-white matter junctions are distinct. Basal cisterns are not effaced. No acute intracranial hemorrhage. Vascular: Moderate intracranial arterial vascular calcifications. Skull: Calvarium appears intact. Sinuses/Orbits: Paranasal sinuses and mastoid air cells are clear. Other: None. CT CERVICAL SPINE FINDINGS Alignment: Slight anterior subluxations demonstrated at C3-4 and C4-5 levels, unchanged since prior study. This is likely degenerative. Normal alignment of the posterior elements. C1-2 articulation appears intact. Skull base and vertebrae: Skull base appears intact. No vertebral compression deformities.  No focal bone lesion or bone destruction. Ligamentous calcifications associated with C1-2. Soft tissues and spinal canal: No prevertebral soft tissue swelling. No abnormal paraspinal soft tissue mass or infiltration. Vascular calcifications. Disc levels: Degenerative changes throughout the cervical spine with narrowed interspaces and endplate hypertrophic change. Degenerative changes are most prominent at C4-5, C5-6, and C6-7 levels. Degenerative changes in the posterior facet joints. Facet joint hypertrophy and uncovertebral spurring causes encroachment of the neural foramina at multiple levels bilaterally. Upper chest: Lung apices are clear. Other: None. IMPRESSION: 1. No acute intracranial abnormalities. Chronic atrophy and small vessel ischemic changes. 2. Slight anterior subluxations at C3-4 and C4-5 levels are unchanged since prior study and likely degenerative. Degenerative changes throughout the cervical spine. No acute displaced fractures identified. Electronically Signed   By: Lucienne Capers M.D.   On: 11/22/2019 00:38   CT Cervical Spine Wo Contrast  Result Date: 11/22/2019 CLINICAL DATA:  Head and neck pain after a fall EXAM: CT HEAD WITHOUT CONTRAST CT CERVICAL SPINE WITHOUT CONTRAST TECHNIQUE: Multidetector CT imaging of the head and cervical spine was performed following the standard protocol without intravenous contrast. Multiplanar CT image reconstructions of the cervical spine were also generated. COMPARISON:  CT cervical spine 12/10/2017 FINDINGS: CT HEAD FINDINGS Brain: Diffuse cerebral atrophy. Ventricular dilatation consistent with central atrophy. Low-attenuation changes in the deep  white matter consistent with small vessel ischemia. No abnormal extra-axial fluid collections. No mass effect or midline shift. Gray-white matter junctions are distinct. Basal cisterns are not effaced. No acute intracranial hemorrhage. Vascular: Moderate intracranial arterial vascular calcifications. Skull:  Calvarium appears intact. Sinuses/Orbits: Paranasal sinuses and mastoid air cells are clear. Other: None. CT CERVICAL SPINE FINDINGS Alignment: Slight anterior subluxations demonstrated at C3-4 and C4-5 levels, unchanged since prior study. This is likely degenerative. Normal alignment of the posterior elements. C1-2 articulation appears intact. Skull base and vertebrae: Skull base appears intact. No vertebral compression deformities. No focal bone lesion or bone destruction. Ligamentous calcifications associated with C1-2. Soft tissues and spinal canal: No prevertebral soft tissue swelling. No abnormal paraspinal soft tissue mass or infiltration. Vascular calcifications. Disc levels: Degenerative changes throughout the cervical spine with narrowed interspaces and endplate hypertrophic change. Degenerative changes are most prominent at C4-5, C5-6, and C6-7 levels. Degenerative changes in the posterior facet joints. Facet joint hypertrophy and uncovertebral spurring causes encroachment of the neural foramina at multiple levels bilaterally. Upper chest: Lung apices are clear. Other: None. IMPRESSION: 1. No acute intracranial abnormalities. Chronic atrophy and small vessel ischemic changes. 2. Slight anterior subluxations at C3-4 and C4-5 levels are unchanged since prior study and likely degenerative. Degenerative changes throughout the cervical spine. No acute displaced fractures identified. Electronically Signed   By: Lucienne Capers M.D.   On: 11/22/2019 00:38   DG Hip Unilat W or Wo Pelvis 2-3 Views Right  Result Date: 11/22/2019 CLINICAL DATA:  Right hip pain after a fall EXAM: DG HIP (WITH OR WITHOUT PELVIS) 2-3V RIGHT COMPARISON:  None. FINDINGS: Acute appearing comminuted fractures of the right superior and inferior pubic rami with mild displacement. The right hip appears otherwise intact. No evidence of acute fracture or dislocation in the hip joint. Visualized sacrum appears intact. SI joints and  symphysis pubis are not displaced. Vascular calcifications. Sacral stimulator device. IMPRESSION: Acute appearing comminuted fractures of the right superior and inferior pubic rami. Electronically Signed   By: Lucienne Capers M.D.   On: 11/22/2019 00:23     Scheduled Meds: . aspirin EC  81 mg Oral Daily  . enoxaparin (LOVENOX) injection  40 mg Subcutaneous Q24H  . gabapentin  100 mg Oral QHS  . gabapentin  200 mg Oral QPC breakfast  . insulin glargine  12 Units Subcutaneous QHS  . metFORMIN  1,000 mg Oral BID WC  . mirabegron ER  25 mg Oral Daily  . mometasone-formoterol  2 puff Inhalation BID  . pantoprazole  40 mg Oral Daily  . sertraline  50 mg Oral BID  . trimethoprim  100 mg Oral Daily  . vitamin B-12  1,000 mcg Oral Daily   Continuous Infusions:   LOS: 1 day    Time spent: [place], MD [facility] To contact the attending provider between 7A-7P or the covering provider during after hours 7P-7A, please log into the web site [email or web address] and access using universal [place] password for that web site. If you do not have the password, please call the hospital operator.  11/23/2019, 7:35 AM

## 2019-11-23 NOTE — Progress Notes (Signed)
Inpatient Diabetes Program Recommendations  AACE/ADA: New Consensus Statement on Inpatient Glycemic Control (2015)  Target Ranges:  Prepandial:   less than 140 mg/dL      Peak postprandial:   less than 180 mg/dL (1-2 hours)      Critically ill patients:  140 - 180 mg/dL    Results for [name], [name] (MRN [id number]) as of 11/23/2019 10:33  Ref. Range 11/22/2019 02:30 11/22/2019 20:29 11/22/2019 21:11 11/23/2019 07:41  Glucose-Capillary Latest Ref Range: 70 - 99 mg/dL 220 (H)  12 units LANTUS given at 3:27am 59 (L) 85 126 (H)    Admit with: Fall pain weakness on right side  History: DM  Home DM Meds: Lantus 12 units QHS       Metformin 1000 mg BID  Current Orders: Lantus 8 units QHS      Metformin 1000 mg BID     MD- Note Lantus dose reduced today.  Please also consider adding Novolog 0-6 units TID AC to current in hospital insulin regimen    --Will follow patient during hospitalization--  Wyn Quaker RN, MSN, CDE Diabetes Coordinator Inpatient Glycemic Control Team Team Pager: [phone] (8a-5p)

## 2019-11-23 NOTE — Progress Notes (Signed)
This RN spoke with pt daughter, [name], and updated her regarding pt care and status at this time. Daughter informed this RN the pt has been seeing a neurologist about the arthritis in her neck and they told the pt this is causing her hands and feet to become numb d/t the nerve damage. Daughter also told this RN the pt has arthritis in her knee and has progressively gotten worse over the years. Daughter wanted to make sure this was documented in the pt chart.

## 2019-11-23 NOTE — Evaluation (Signed)
Physical Therapy Evaluation Patient Details Name: Wendy Velasquez MRN: [id number] DOB: 1925/10/20 Today's Date: 11/23/2019   History of Present Illness  Pt is a 86 year old female with history of hypertension, hyperlipidemia, COPD, cardiac pacemaker, COVID 31 Mar 2019, and presented to ED following a fall.  Pt found to have acute appearing comminuted fractures of the right superior and inferior pubic rami.  Clinical Impression  Pt admitted with above diagnosis.  Pt currently with functional limitations due to the deficits listed below (see PT Problem List). Pt will benefit from skilled PT to increase their independence and safety with mobility to allow discharge to the venue listed below.  Pt very agreeable to mobilize however requiring mod assist.  Pt also with increased nausea during mobility which improved upon return to bed.  Pt from ALF and will likely need SNF upon d/c.     Follow Up Recommendations SNF    Equipment Recommendations  Rolling walker with 5" wheels    Recommendations for Other Services       Precautions / Restrictions Precautions Precautions: Fall Restrictions Weight Bearing Restrictions: No Other Position/Activity Restrictions: WBAT      Mobility  Bed Mobility Overal bed mobility: Needs Assistance Bed Mobility: Supine to Sit;Sit to Supine     Supine to sit: Mod assist Sit to supine: Mod assist   General bed mobility comments: verbal cues for self assist, pt able to assist upper body however required assist for R LE and scooting as well as bringing LEs onto bed  Transfers Overall transfer level: Needs assistance Equipment used: Rolling walker (2 wheeled) Transfers: Sit to/from Stand Sit to Stand: Mod assist         General transfer comment: assist to rise and steady, pt unable to weight shift to step to recliner, fatigued quickly and reported nausea so assisted to sitting, pt remained nauseated and returned to bed  Ambulation/Gait                 Stairs            Wheelchair Mobility    Modified Rankin (Stroke Patients Only)       Balance Overall balance assessment: History of Falls                                           Pertinent Vitals/Pain Pain Assessment: Faces Faces Pain Scale: Hurts little more Pain Location: right groin/hip Pain Descriptors / Indicators: Grimacing;Sore;Guarding Pain Intervention(s): Monitored during session;Repositioned (pt reported premed with IV but RN states that was last night)    Home Living Family/patient expects to be discharged to:: Assisted living               Home Equipment: Walker - 4 wheels      Prior Function Level of Independence: Independent with assistive device(s)               Hand Dominance        Extremity/Trunk Assessment        Lower Extremity Assessment Lower Extremity Assessment: RLE deficits/detail;Generalized weakness RLE Deficits / Details: pt requesting assist for movement due to pain       Communication   Communication: No difficulties  Cognition Arousal/Alertness: Awake/alert Behavior During Therapy: WFL for tasks assessed/performed  General Comments: possibly a little disorientated to timeline of events during stay - received IV meds last night per RN, pt appropriate and following commands during session      General Comments      Exercises     Assessment/Plan    PT Assessment Patient needs continued PT services  PT Problem List Decreased strength;Decreased mobility;Decreased activity tolerance;Decreased balance;Decreased knowledge of use of DME;Pain       PT Treatment Interventions DME instruction;Therapeutic exercise;Gait training;Balance training;Functional mobility training;Therapeutic activities;Patient/family education    PT Goals (Current goals can be found in the Care Plan section)  Acute Rehab PT Goals PT Goal Formulation: With  patient Time For Goal Achievement: 12/07/19 Potential to Achieve Goals: Good    Frequency Min 3X/week   Barriers to discharge        Co-evaluation               AM-PAC PT "6 Clicks" Mobility  Outcome Measure Help needed turning from your back to your side while in a flat bed without using bedrails?: A Little Help needed moving from lying on your back to sitting on the side of a flat bed without using bedrails?: A Lot Help needed moving to and from a bed to a chair (including a wheelchair)?: A Lot Help needed standing up from a chair using your arms (e.g., wheelchair or bedside chair)?: A Lot Help needed to walk in hospital room?: A Lot Help needed climbing 3-5 steps with a railing? : Total 6 Click Score: 12    End of Session Equipment Utilized During Treatment: Gait belt Activity Tolerance: Other (comment) (limited by nausea) Patient left: in bed;with call bell/phone within reach;with bed alarm set Nurse Communication: Mobility status PT Visit Diagnosis: Other abnormalities of gait and mobility (R26.89);Unsteadiness on feet (R26.81)    Time: 1572-6203 PT Time Calculation (min) (ACUTE ONLY): 19 min   Charges:   PT Evaluation $PT Eval Low Complexity: 1 Low     Kati PT, DPT Acute Rehabilitation Services Pager: [phone] Office: [phone]  York Ram E 11/23/2019, 11:33 AM

## 2019-11-24 LAB — GLUCOSE, CAPILLARY: Glucose-Capillary: 224 mg/dL — ABNORMAL HIGH (ref 70–99)

## 2019-11-24 MED ORDER — METFORMIN HCL 500 MG PO TABS
1000.0000 mg | ORAL_TABLET | Freq: Two times a day (BID) | ORAL | Status: DC
  Administered 2019-11-24 – 2019-11-25 (×2): 1000 mg via ORAL
  Filled 2019-11-24 (×2): qty 2

## 2019-11-24 MED ORDER — SERTRALINE HCL 50 MG PO TABS: 50.0000 mg | ORAL_TABLET | Freq: Two times a day (BID) | ORAL | 0 refills | Status: DC

## 2019-11-24 MED ORDER — POLYETHYLENE GLYCOL 3350 17 G PO PACK: 17.0000 g | PACK | Freq: Every day | ORAL | 0 refills | Status: DC

## 2019-11-24 MED ORDER — TRAMADOL HCL 50 MG PO TABS
50.0000 mg | ORAL_TABLET | Freq: Three times a day (TID) | ORAL | 0 refills | Status: DC
Start: 2019-11-24 — End: 2019-12-16

## 2019-11-24 MED ORDER — HYDROCODONE-ACETAMINOPHEN 5-325 MG PO TABS
1.0000 | ORAL_TABLET | ORAL | Status: DC | PRN
  Administered 2019-11-25: 1 via ORAL
  Administered 2019-11-25: 2 via ORAL
  Filled 2019-11-24: qty 1
  Filled 2019-11-24: qty 2

## 2019-11-24 MED ORDER — HYDROCODONE-ACETAMINOPHEN 5-325 MG PO TABS: 1.0000 | ORAL_TABLET | ORAL | 0 refills | Status: DC | PRN

## 2019-11-24 MED ORDER — SENNOSIDES-DOCUSATE SODIUM 8.6-50 MG PO TABS
1.0000 | ORAL_TABLET | Freq: Every day | ORAL | Status: DC
  Administered 2019-11-24 – 2019-11-25 (×2): 1 via ORAL
  Filled 2019-11-24 (×2): qty 1

## 2019-11-24 MED ORDER — ADULT MULTIVITAMIN W/MINERALS CH
1.0000 | ORAL_TABLET | Freq: Every day | ORAL | Status: DC
  Administered 2019-11-24 – 2019-11-25 (×2): 1 via ORAL
  Filled 2019-11-24 (×2): qty 1

## 2019-11-24 MED ORDER — KATE FARMS STANDARD 1.4 PO LIQD
325.0000 mL | Freq: Every day | ORAL | Status: DC
  Administered 2019-11-24 – 2019-11-25 (×2): 325 mL via ORAL
  Filled 2019-11-24 (×2): qty 325

## 2019-11-24 MED ORDER — TRAMADOL HCL 50 MG PO TABS
50.0000 mg | ORAL_TABLET | Freq: Three times a day (TID) | ORAL | Status: DC
  Administered 2019-11-24 – 2019-11-25 (×3): 50 mg via ORAL
  Filled 2019-11-24 (×3): qty 1

## 2019-11-24 MED ORDER — SENNOSIDES-DOCUSATE SODIUM 8.6-50 MG PO TABS: 1.0000 | ORAL_TABLET | Freq: Every day | ORAL | Status: DC

## 2019-11-24 MED ORDER — POLYETHYLENE GLYCOL 3350 17 G PO PACK
17.0000 g | PACK | Freq: Every day | ORAL | Status: DC
  Administered 2019-11-24 – 2019-11-25 (×2): 17 g via ORAL
  Filled 2019-11-24 (×2): qty 1

## 2019-11-24 NOTE — Progress Notes (Signed)
Patient seen and examined at bedside Long discussion with daughter Pain is no completely controlled but she is only been getting Tylenol high-dose I have scheduled tramadol today she can get as needed morphine for breakthrough we have graduated back her Metformin to prior to admission dosing and on discharge when her appetite improves she can resume her Lantus We will get Covid testing today and probably be able to discharge her to skilled facility tomorrow I am coordinating discharge paperwork for that eventuality today-see my discharge summary dated 9/14  If pain is better controlled can discharge to facility 9/15 a.m.

## 2019-11-24 NOTE — Progress Notes (Signed)
Pt alert and aware sitting up in chair. Daughter at bedside. They heard me singing in the other pt room. So I asked if I could share a tune with them. It turn out to be her favorite hymn called [place]. They where grateful for visit. Further visits will be offered.

## 2019-11-24 NOTE — TOC Progression Note (Signed)
Transition of Care [facility]) - Progression Note    Patient Details  Name: Wendy Velasquez MRN: [id number] Date of Birth: 01/06/1926  Transition of Care [facility]) CM/SW Contact  Breionna Punt, [name], RN Phone Number: 11/24/2019, 1:08 PM  Clinical Narrative:     1.  1.7 mi     [place] at [place]  [place], [place] [address]  [phone]     Overall rating    Below average      2.  2 mi     [facility] at the [place]  [place], [place] [address]  [phone]     Overall rating    Below average      3.  2.1 mi     [place] at [place], [place] [address]  [phone]     Overall rating    Much below average      4.  2.2 mi     [facility]                   [place], [place] [address]  [phone]     Overall rating    Much above average      5.  2.2 mi     [place]                   [place], [place] [address]  [phone]     Overall rating    Below average      6.  2.7 [place]                   [place], [place] [address]  [phone]     Overall rating    Much above average      7.  3.2 mi     [place]                   [address]  [place], [place] [address]  [phone]     Overall rating    Below average      8.  3.3 [place]                   [address] [place], [place] [address]  [phone]     Overall rating    Below average      9.  3.5 mi     [facility]                   [place],  [address]  [phone]     Overall rating    Average      10.  4.3 [place]  [place], [place] [address]  [phone]     Overall rating    Below average      11.  4.7 mi     [facility] at [place], [place] [address]  [phone]     Overall rating    Much above average      12.  5.2 mi     [facility]                   [address] [place], [place] [address]  [phone]     Overall rating    Much above average      13.  6.2 mi     [place]  [place], [place] [address]  [phone]     Overall rating    Average      14.  8.1 [place]                   [place], [place] [address]  [phone]     Overall rating    Above average      15.  8.1 mi     [facility] [place]  [place], [place] [address]  [phone]     Overall rating    Much below average      16.  9.7 mi     [facility]                   [address], [place] [address]  [phone]     Overall rating    Below average      17.  [place]                   [address]  [place], [place] [address]  [phone]     Overall rating    Much above  average      18.  11.4 mi     [place] [facility]                   [address]  [place], [place] [address]  [phone]     Overall rating    Much above average      19.  13.5 [place]  [place], [place] [address]  [phone]     Overall rating    Much below average      20.  13.5 [place]  [place], [place] [address]  [phone]     Overall rating    Much below average  21.  14.5 [place]                   [address]  [place], [place] [address]  [phone]     Overall rating    Much below average      22.  14.9 mi     [facility]                   [address]  [place], [place] [address]  [phone]     Overall rating    Much below average      23.  15.1 mi     [facility] [place]  [place], [place] [address]  [phone]     Overall rating    Below average      24.  15.3 mi     [facility]                   [place], [place] [address]  [phone]     Overall rating    Average      25.  [place]                   [place], [place] [address]  [phone]     Overall rating    Much above average      26.  16.2 [place]                   [place], [place] [address]  [phone]     Overall rating    Much above average      27.  16.6  mi     [place]                   [address]  [place], [place] [address]  [phone]     Overall rating    Below average      28.  17.2 mi     [place]                   [place], [place] [address]  [phone]     Overall rating    Average      29.  92.4 [facility]                   [address]  [place], [place] [address]  [phone]     Overall rating    Much below average      30.  19.4 mi     [place] [facility] [employer] [facility] [employer] at [facility] [place],  [address]  [phone]     Overall rating    Much above average      31.  20.6 [place]  [place], [place] [address]  [phone]     Overall rating    Below average      32.  20.7 mi     [place]                   [place], [place] [address]  [phone]     Overall rating    Average      33.  11.9 [facility]                   [address]  [place], [place] [address]  [phone]     Overall rating    Below average      34.  21.1 [place]  [place], [place] [address]  [phone]     Overall rating    Much above average      35.  [place]                   [address]  [place], [place] [address]  [phone]     Overall rating    Below average      36.  22.4 [place]                   [address]  [place], [place] [address]  [phone]     Overall rating    Below average      37.  22.7 mi     [facility]                   [address]  [place], [place] [address]  [phone]     Overall rating    Above average      38.  22.8 [address]                   [place], [place] [address]  [phone]     Overall rating    Much above average      39.  22.9 [place], [place] [address]  [phone]     Overall rating    Below average      40.  [place]                   [place], [place] [address]  [phone]     Overall rating    Much below average      41.  24.4 mi     [place]                   [address]  [place], [place] [address]  [phone]     Overall rating    Below average      42.  24.6 mi     [facility]  [place], [place] [address]  [phone]     Overall rating    Much below average      43.  24.6 mi     [place]  [place], [place] [address]  [phone]     Overall rating    Below average      44.  24.9 mi     [facility]                   [place],  [address]  [phone]     Overall rating    Much below average     To explore and download nursing home data,visit the data catalog on CMS.gov     Expected Discharge Plan: [place] Barriers to Discharge:  Continued Medical Work up  Expected Discharge Plan and Services Expected Discharge Plan: [place] Choice: Nursing Home Living arrangements for the past 2 months: Assisted Living Facility                                       Social Determinants of Health (SDOH) Interventions    Readmission Risk Interventions No flowsheet data found.

## 2019-11-24 NOTE — Discharge Summary (Signed)
Physician Discharge Summary  Wendy Velasquez BJS:[id number] DOB: 1925/10/10 DOA: 11/21/2019  PCP: [name], MD  Admit date: 11/21/2019 Discharge date: 11/24/2019  Time spent: 33 minutes  Recommendations for Outpatient Follow-up:  1. Pain control first choice 50 mg tramadol iii/day, second choice Norco and or increase intensity to Percocet every 4 as needed 2. Resume Lantus at facility 3. Outpatient CBC Chem-12 in 1 week 4. Discharging to rehab for further skilled rehab for sacral insufficiency fractures-is weightbearing as tolerated  Discharge Diagnoses:  Active Problems:   Hip fracture (HCC)   Pressure injury of skin   Discharge Condition: Improved  Diet recommendation: Heart healthy diabetic  Filed Weights   11/22/19 0206 11/22/19 2025  Weight: 54.4 kg 55.5 kg    History of present illness:  86 year old Caucasian [facility]pre[facility] [place] until 2 months ago when she started having more falls and was transferred to assisted living Cardiac arrest resulting from complete heart block status post pacemaker dual-chamber [facility] placement 09/11/12,  prior admissions 2016 for pneumonia DM TY 2 Reflux Frequent urinary tract infections on suppressive trimethoprim Depression COPD Osteoporosis Recovered from coronavirus infection after admission 04/04/2019 through 04/23/2019  Ambulated with walker and fell there hit head right hip unable to bear weight. Experienced immediate pain and was brought into the hospital 11/22/2019 She is unable to walk or ambulate  Patient was admitted for pain control given severe pain and inability to walk in the emergency room and will need therapy evaluation   Hospital Course:  1. Sacral insufficiency fractures a. Therapy recommending skilled facility placement b. Tramadol first choice for pain, Norco second choice c. Covid testing-.  Family is deciding on skilled facility to  discharge to in a.m. 9/15 2. Nausea on standing 3. Tells me that she has had this since she had been diagnosed with Covid a. Probably episodic and related to pain--no further episodes b. Monitor-medicate for nausea as needed 4. Cardiac arrest 09/2012 + CHB + PPM [facility] a. Monitor strip showed paced rhythm with PVC therefore discontinued the same b. No further issues 5. DM TY 2 with complication of hypoglycemia this admission a. Stopped all Lantus because of sugars in the 50s b. Metformin started cautiously XL 500 but resumed to 1000 twice daily on discharge 6. Normocytic anemia a. No further work-up today follow-up as an outpatient 7. Frequent urinary infections/suppressive trimethoprim a. Continue Bactrim prior to admission dosing outpatient neurology follow-up 8. Depression a. Continue sertraline 50 twice daily 9. COPD Gold stage II a. No wheeze today seems stable b. Continue albuterol every 6 Flonase duo nebs and Dulera 10. Osteoporosis 11. Prior Covid infection 04/01/2019 1. Await further testing 12. Advanced age and frailty   Discharge Exam: Vitals:   11/24/19 1223 11/24/19 1332  BP:  (!) 112/52  Pulse:  85  Resp:  18  Temp:  98.2 F (36.8 C)  SpO2: 91% 92%    General: Awake alert coherent no distress EOMI NCAT no focal deficit somewhat weak and in pain Cardiovascular: S1-S2 no murmur rub or gallop RRR Respiratory: Clinically clear no rales rhonchi Limited ROM right lower extremity painful SLR right side No swelling in legs Ecchymoses however over various areas of skin  Discharge Instructions   Discharge Instructions    Diet - low sodium heart healthy   Complete by: As directed    Increase activity slowly   Complete by: As directed    No wound care  Complete by: As directed      Allergies as of 11/24/2019   No Known Allergies     Medication List    STOP taking these medications   acetaminophen 500 MG tablet Commonly known as: TYLENOL     TAKE  these medications   Advair Diskus 250-50 MCG/DOSE Aepb Generic drug: Fluticasone-Salmeterol Inhale 1 puff into the lungs 2 (two) times daily.   albuterol (2.5 MG/3ML) 0.083% nebulizer solution Commonly known as: PROVENTIL Take 2.5 mg by nebulization every 6 (six) hours as needed for wheezing or shortness of breath.   albuterol-ipratropium 18-103 MCG/ACT inhaler Commonly known as: COMBIVENT Inhale 2 puffs into the lungs every 8 (eight) hours as needed for wheezing.   aspirin EC 81 MG tablet Take 81 mg by mouth daily.   Botox 100 units Solr injection Generic drug: botulinum toxin Type A Inject 100 Units into the muscle every 3 (three) months.   cetirizine 10 MG tablet Commonly known as: ZYRTEC Take 10 mg by mouth daily as needed for allergies.   fluticasone 50 MCG/ACT nasal spray Commonly known as: FLONASE Place 2 sprays into both nostrils daily as needed for allergies. SHAKE AS DIRECTED   gabapentin 100 MG capsule Commonly known as: Neurontin Take 2 capsules in am, 2 capsules midday, 1 capsule at bedtime.   HYDROcodone-acetaminophen 5-325 MG tablet Commonly known as: NORCO/VICODIN Take 1-2 tablets by mouth every 4 (four) hours as needed for moderate pain.   ibandronate 150 MG tablet Commonly known as: BONIVA Take 150 mg by mouth every 30 (thirty) days. Take in the morning with a full glass of water, on an empty stomach, and do not take anything else by mouth or lie down for the next 30 min.   insulin glargine 100 UNIT/ML injection Commonly known as: LANTUS Inject 12 Units into the skin at bedtime. 9 pm daily   lidocaine-prilocaine cream Commonly known as: EMLA Apply 1 application topically 4 (four) times daily as needed. What changed: reasons to take this   metFORMIN 1000 MG tablet Commonly known as: GLUCOPHAGE Take 1,000 mg by mouth 2 (two) times daily with a meal.   Myrbetriq 25 MG Tb24 tablet Generic drug: mirabegron ER Take 25 mg by mouth daily.   NEXIUM  PO Take 1 tablet by mouth daily.   polyethylene glycol 17 g packet Commonly known as: MIRALAX / GLYCOLAX Take 17 g by mouth daily.   senna-docusate 8.6-50 MG tablet Commonly known as: Senokot-S Take 1 tablet by mouth daily.   sertraline 50 MG tablet Commonly known as: ZOLOFT Take 1 tablet (50 mg total) by mouth 2 (two) times daily.   Simbrinza 1-0.2 % Susp Generic drug: Brinzolamide-Brimonidine Apply 1 drop to eye at bedtime.   traMADol 50 MG tablet Commonly known as: ULTRAM Take 1 tablet (50 mg total) by mouth every 8 (eight) hours.   trimethoprim 100 MG tablet Commonly known as: TRIMPEX Take 1 tablet (100 mg total) by mouth daily. OK TO RESUME AFTER YOU FINISH CURRENT ANTIBIOTIC THERAPY.   vitamin B-12 1000 MCG tablet Commonly known as: CYANOCOBALAMIN Take 1,000 mcg by mouth daily.      No Known Allergies    The results of significant diagnostics from this hospitalization (including imaging, microbiology, ancillary and laboratory) are listed below for reference.    Significant Diagnostic Studies: CT Head Wo Contrast  Result Date: 11/22/2019 CLINICAL DATA:  Head and neck pain after a fall EXAM: CT HEAD WITHOUT CONTRAST CT CERVICAL SPINE WITHOUT CONTRAST TECHNIQUE: Multidetector  CT imaging of the head and cervical spine was performed following the standard protocol without intravenous contrast. Multiplanar CT image reconstructions of the cervical spine were also generated. COMPARISON:  CT cervical spine 12/10/2017 FINDINGS: CT HEAD FINDINGS Brain: Diffuse cerebral atrophy. Ventricular dilatation consistent with central atrophy. Low-attenuation changes in the deep white matter consistent with small vessel ischemia. No abnormal extra-axial fluid collections. No mass effect or midline shift. Gray-white matter junctions are distinct. Basal cisterns are not effaced. No acute intracranial hemorrhage. Vascular: Moderate intracranial arterial vascular calcifications. Skull:  Calvarium appears intact. Sinuses/Orbits: Paranasal sinuses and mastoid air cells are clear. Other: None. CT CERVICAL SPINE FINDINGS Alignment: Slight anterior subluxations demonstrated at C3-4 and C4-5 levels, unchanged since prior study. This is likely degenerative. Normal alignment of the posterior elements. C1-2 articulation appears intact. Skull base and vertebrae: Skull base appears intact. No vertebral compression deformities. No focal bone lesion or bone destruction. Ligamentous calcifications associated with C1-2. Soft tissues and spinal canal: No prevertebral soft tissue swelling. No abnormal paraspinal soft tissue mass or infiltration. Vascular calcifications. Disc levels: Degenerative changes throughout the cervical spine with narrowed interspaces and endplate hypertrophic change. Degenerative changes are most prominent at C4-5, C5-6, and C6-7 levels. Degenerative changes in the posterior facet joints. Facet joint hypertrophy and uncovertebral spurring causes encroachment of the neural foramina at multiple levels bilaterally. Upper chest: Lung apices are clear. Other: None. IMPRESSION: 1. No acute intracranial abnormalities. Chronic atrophy and small vessel ischemic changes. 2. Slight anterior subluxations at C3-4 and C4-5 levels are unchanged since prior study and likely degenerative. Degenerative changes throughout the cervical spine. No acute displaced fractures identified. Electronically Signed   By: Lucienne Capers M.D.   On: 11/22/2019 00:38   CT Cervical Spine Wo Contrast  Result Date: 11/22/2019 CLINICAL DATA:  Head and neck pain after a fall EXAM: CT HEAD WITHOUT CONTRAST CT CERVICAL SPINE WITHOUT CONTRAST TECHNIQUE: Multidetector CT imaging of the head and cervical spine was performed following the standard protocol without intravenous contrast. Multiplanar CT image reconstructions of the cervical spine were also generated. COMPARISON:  CT cervical spine 12/10/2017 FINDINGS: CT HEAD  FINDINGS Brain: Diffuse cerebral atrophy. Ventricular dilatation consistent with central atrophy. Low-attenuation changes in the deep white matter consistent with small vessel ischemia. No abnormal extra-axial fluid collections. No mass effect or midline shift. Gray-white matter junctions are distinct. Basal cisterns are not effaced. No acute intracranial hemorrhage. Vascular: Moderate intracranial arterial vascular calcifications. Skull: Calvarium appears intact. Sinuses/Orbits: Paranasal sinuses and mastoid air cells are clear. Other: None. CT CERVICAL SPINE FINDINGS Alignment: Slight anterior subluxations demonstrated at C3-4 and C4-5 levels, unchanged since prior study. This is likely degenerative. Normal alignment of the posterior elements. C1-2 articulation appears intact. Skull base and vertebrae: Skull base appears intact. No vertebral compression deformities. No focal bone lesion or bone destruction. Ligamentous calcifications associated with C1-2. Soft tissues and spinal canal: No prevertebral soft tissue swelling. No abnormal paraspinal soft tissue mass or infiltration. Vascular calcifications. Disc levels: Degenerative changes throughout the cervical spine with narrowed interspaces and endplate hypertrophic change. Degenerative changes are most prominent at C4-5, C5-6, and C6-7 levels. Degenerative changes in the posterior facet joints. Facet joint hypertrophy and uncovertebral spurring causes encroachment of the neural foramina at multiple levels bilaterally. Upper chest: Lung apices are clear. Other: None. IMPRESSION: 1. No acute intracranial abnormalities. Chronic atrophy and small vessel ischemic changes. 2. Slight anterior subluxations at C3-4 and C4-5 levels are unchanged since prior study and likely degenerative. Degenerative changes  throughout the cervical spine. No acute displaced fractures identified. Electronically Signed   By: Lucienne Capers M.D.   On: 11/22/2019 00:38   CUP PACEART  REMOTE DEVICE CHECK  Result Date: 11/15/2019 Scheduled remote reviewed. Normal device function.  AF burden <1%, all <15 mins, FFRW Next remote 91 days. JM  DG Hip Unilat W or Wo Pelvis 2-3 Views Right  Result Date: 11/22/2019 CLINICAL DATA:  Right hip pain after a fall EXAM: DG HIP (WITH OR WITHOUT PELVIS) 2-3V RIGHT COMPARISON:  None. FINDINGS: Acute appearing comminuted fractures of the right superior and inferior pubic rami with mild displacement. The right hip appears otherwise intact. No evidence of acute fracture or dislocation in the hip joint. Visualized sacrum appears intact. SI joints and symphysis pubis are not displaced. Vascular calcifications. Sacral stimulator device. IMPRESSION: Acute appearing comminuted fractures of the right superior and inferior pubic rami. Electronically Signed   By: Lucienne Capers M.D.   On: 11/22/2019 00:23    Microbiology: Recent Results (from the past 240 hour(s))  SARS Coronavirus 2 by RT PCR (hospital order, performed in [facility] hospital lab) Nasopharyngeal Nasopharyngeal Swab     Status: None   Collection Time: 11/22/19  2:26 AM   Specimen: Nasopharyngeal Swab  Result Value Ref Range Status   SARS Coronavirus 2 NEGATIVE NEGATIVE Final    Comment: (NOTE) SARS-CoV-2 target nucleic acids are NOT DETECTED.  The SARS-CoV-2 RNA is generally detectable in upper and lower respiratory specimens during the acute phase of infection. The lowest concentration of SARS-CoV-2 viral copies this assay can detect is 250 copies / mL. A negative result does not preclude SARS-CoV-2 infection and should not be used as the sole basis for treatment or other patient management decisions.  A negative result may occur with improper specimen collection / handling, submission of specimen other than nasopharyngeal swab, presence of viral mutation(s) within the areas targeted by this assay, and inadequate number of viral copies (<250 copies / mL). A negative result must  be combined with clinical observations, patient history, and epidemiological information.  Fact Sheet for Patients:   StrictlyIdeas.no  Fact Sheet for Healthcare Providers: BankingDealers.co.za  This test is not yet approved or  cleared by the [place] FDA and has been authorized for detection and/or diagnosis of SARS-CoV-2 by FDA under an Emergency Use Authorization (EUA).  This EUA will remain in effect (meaning this test can be used) for the duration of the COVID-19 declaration under Section 564(b)(1) of the Act, 21 U.S.C. section 360bbb-3(b)(1), unless the authorization is terminated or revoked sooner.  Performed at [facility], [place] [address], [place], [place] [address]   MRSA PCR Screening     Status: None   Collection Time: 11/22/19  8:48 PM   Specimen: Nasopharyngeal  Result Value Ref Range Status   MRSA by PCR NEGATIVE NEGATIVE Final    Comment:        The GeneXpert MRSA Assay (FDA approved for NASAL specimens only), is one component of a comprehensive MRSA colonization surveillance program. It is not intended to diagnose MRSA infection nor to guide or monitor treatment for MRSA infections. Performed at [facility], [place] [address], [place], [place] [address]      Labs: Basic Metabolic Panel: Recent Labs  Lab 11/22/19 0226 11/22/19 1900 11/23/19 0523  NA 138  --  137  K 4.3  --  4.5  CL 101  --  101  CO2 29  --  26  GLUCOSE 175*  --  107*  BUN 26*  --  26*  CREATININE 0.68 0.75 0.60  CALCIUM 9.0  --  8.6*   Liver Function Tests: Recent Labs  Lab 11/23/19 0523  AST 14*  ALT 15  ALKPHOS 47  BILITOT 0.4  PROT 5.4*  ALBUMIN 3.1*   No results for input(s): LIPASE, AMYLASE in the last 168 hours. No results for input(s): AMMONIA in the last 168 hours. CBC: Recent Labs  Lab 11/22/19 0226 11/22/19 1900 11/23/19 0523  WBC 8.9 9.6 10.2  NEUTROABS 6.4  --    --   HGB 10.1* 9.7* 10.1*  HCT 31.9* 31.3* 31.6*  MCV 89.1 91.5 89.8  PLT 222 205 202   Cardiac Enzymes: No results for input(s): CKTOTAL, CKMB, CKMBINDEX, TROPONINI in the last 168 hours. BNP: BNP (last 3 results) Recent Labs    04/18/19 0630  BNP 112.6*    ProBNP (last 3 results) No results for input(s): PROBNP in the last 8760 hours.  CBG: Recent Labs  Lab 11/23/19 0741 11/23/19 1152 11/23/19 1657 11/23/19 2104 11/24/19 0732  GLUCAP 126* 223* 276* 288* 224*       Signed:  Nita Sells MD   [facility] 11/24/2019, 2:59 PM

## 2019-11-24 NOTE — Progress Notes (Signed)
Initial Nutrition Assessment  RD working remotely.  DOCUMENTATION CODES:   Not applicable  INTERVENTION:  - will order [employer] 1.4 po once/day, each supplement provides 455 kcal and 20 grams protein. - will order 1 tablet multivitamin with minerals/day.   NUTRITION DIAGNOSIS:   Increased nutrient needs related to acute illness as evidenced by estimated needs.  GOAL:   Patient will meet greater than or equal to 90% of their needs  MONITOR:   PO intake, Supplement acceptance, Labs, Weight trends  REASON FOR ASSESSMENT:   Malnutrition Screening Tool  ASSESSMENT:   86 year old female who is a resident at [facility] for the past 2 months due to recurrent falls and need for ALF. She has medical history of cardiac arrest d/t complete heart block s/p pacemaker, type 2 DM, GERD, COPD, depression, recurrent UTIs, osteoporosis, and COVID-19 with admission 04/04/19-04/23/19. She presented to the ED after a fall while ambulating with her walker; resulted in R hip pain with inability to bear weight.  No intakes documented since admission on 9/12 AM. Weight on admission was 122 lb and has been stable since 04/17/19. Weight on 02/25/19 was 128 lb. This indicates 6 lb weight loss (4.7% body weight) in the past 9 months; not significant for time frame.    Per notes: - sacral fractures - nausea with standing since COVID dx - plan at time of d/c is to return to ALF   Labs reviewed; CBG: 224 mg/dl, BUN: 26 mg/dl, Ca: 8.6 mg/dl. Medications reviewed; 500 mg metformin/day, 40 mg oral protonix/day, 1000 mcg oral cyanocobalamin/day.     NUTRITION - FOCUSED PHYSICAL EXAM:  unable to complete at this time.   Diet Order:   Diet Order            Diet Carb Modified Fluid consistency: Thin; Room service appropriate? Yes  Diet effective now                 EDUCATION NEEDS:   No education needs have been identified at this time  Skin:  Skin Assessment: Skin Integrity Issues: Skin  Integrity Issues:: Stage I Stage I: buttocks  Last BM:  PTA  Height:   Ht Readings from Last 1 Encounters:  11/22/19 5\' 5"  (1.651 m)    Weight:   Wt Readings from Last 1 Encounters:  11/22/19 55.5 kg     Estimated Nutritional Needs:  Kcal:  1200-1400 kcal Protein:  60-70 grams Fluid:  >/= 1.5 L/day     Jarome Matin, MS, RD, LDN, CNSC Inpatient Clinical Dietitian RD pager # available in [facility]  After hours/weekend pager # available in [facility]

## 2019-11-24 NOTE — TOC Progression Note (Signed)
Transition of Care [facility]) - Progression Note    Patient Details  Name: Wendy Velasquez MRN: [id number] Date of Birth: [birth year]  Transition [facility] Care [facility]) CM/SW Contact  Yarielis Funaro, [name], RN Phone Number: 11/24/2019, 12:58 PM  Clinical Narrative:  Bed offers given to dtr [facility] await choice.     Expected Discharge Plan: [place] Barriers to Discharge: Continued Medical Work up  Expected Discharge Plan and Services Expected Discharge Plan: [place] Choice: Nursing Home Living arrangements for the past 2 months: Assisted Living Facility                                       Social Determinants of Health (SDOH) Interventions    Readmission Risk Interventions No flowsheet data found.

## 2019-11-24 NOTE — TOC Progression Note (Signed)
Transition of Care [facility]) - Progression Note    Patient Details  Name: Wendy Velasquez MRN: [id number] Date of Birth: 01/09/1926  Transition of Care [facility]) CM/SW Contact  Ha Shannahan, [name], RN Phone Number: 11/24/2019, 3:56 PM  Clinical Narrative: [facility]tr [facility]. D/c n am.covid ordered.    Expected Discharge Plan: [place] Barriers to Discharge: Continued Medical Work up  Expected Discharge Plan and Services Expected Discharge Plan: [place] Choice: Nursing Home Living arrangements for the past 2 months: Assisted Living Facility                                       Social Determinants of Health (SDOH) Interventions    Readmission Risk Interventions No flowsheet data found.

## 2019-11-24 NOTE — Progress Notes (Signed)
Physical Therapy Treatment Patient Details Name: Wendy Velasquez MRN: [id number] DOB: 1925-12-21 Today's Date: 11/24/2019    History of Present Illness Pt is a 86 year old female with history of hypertension, hyperlipidemia, COPD, cardiac pacemaker, COVID 31 Mar 2019, and presented to ED following a fall.  Pt found to have acute appearing comminuted fractures of the right superior and inferior pubic rami.    PT Comments    Assisted OOB to [facility] then to recliner required + 2 assist and increased time.  General bed mobility comments: pt required increased assist esp to complete scooting to EOB.  Increased pain with sitting est R side.General transfer comment: 75% VC's on proper hand placement assisted from EOB to [facility] required increased assist to complete 1/4 with inability to tolerate much WBing thru R LE due to pain. Recliner switch behind with BSC and positioned in recliner to comfort.    Follow Up Recommendations  SNF     Equipment Recommendations  Rolling walker with 5" wheels    Recommendations for Other Services       Precautions / Restrictions Precautions Precautions: Fall Restrictions Weight Bearing Restrictions: No Other Position/Activity Restrictions: WBAT    Mobility  Bed Mobility Overal bed mobility: Needs Assistance Bed Mobility: Supine to Sit     Supine to sit: Max assist     General bed mobility comments: pt required increased assist esp to complete scooting to EOB.  Increased pain with sitting est R side.  Transfers Overall transfer level: Needs assistance Equipment used: Rolling walker (2 wheeled) Transfers: Sit to/from Stand Sit to Stand: +2 physical assistance;+2 safety/equipment;Mod assist         General transfer comment: 75% VC's on proper hand placement assisted from EOB to [facility] required increased assist to complete 1/4 with inability to tolerate much WBing thru R LE due to pain.  Ambulation/Gait         Gait velocity: decreased   General  Gait Details: pt unable to advance R LE or tolerate much WBing thru R LE to take any functional steps.  Transfers only this session.   Stairs             Wheelchair Mobility    Modified Rankin (Stroke Patients Only)       Balance                                            Cognition Arousal/Alertness: Awake/alert Behavior During Therapy: WFL for tasks assessed/performed Overall Cognitive Status: Within Functional Limits for tasks assessed                                 General Comments: AxO x 2 sweet and following all direction with minimal redirection <25% of time      Exercises      General Comments        Pertinent Vitals/Pain Pain Assessment: Faces Faces Pain Scale: Hurts whole lot Pain Location: Right hip/buttock Pain Descriptors / Indicators: Grimacing;Sore;Guarding Pain Intervention(s): Monitored during session;Premedicated before session;Repositioned    Home Living                      Prior Function            PT Goals (current goals can now be found in the care plan  section) Progress towards PT goals: Progressing toward goals    Frequency    Min 3X/week      PT Plan Current plan remains appropriate    Co-evaluation              AM-PAC PT "6 Clicks" Mobility   Outcome Measure  Help needed turning from your back to your side while in a flat bed without using bedrails?: A Lot Help needed moving from lying on your back to sitting on the side of a flat bed without using bedrails?: A Lot Help needed moving to and from a bed to a chair (including a wheelchair)?: A Lot Help needed standing up from a chair using your arms (e.g., wheelchair or bedside chair)?: A Lot Help needed to walk in hospital room?: Total Help needed climbing 3-5 steps with a railing? : Total 6 Click Score: 10    End of Session Equipment Utilized During Treatment: Gait belt Activity Tolerance: Patient limited by  fatigue;Patient limited by pain Patient left: in chair;with call bell/phone within reach;with chair alarm set;with family/visitor present Nurse Communication: Mobility status PT Visit Diagnosis: Other abnormalities of gait and mobility (R26.89);Unsteadiness on feet (R26.81)     Time: 1137-1202 PT Time Calculation (min) (ACUTE ONLY): 25 min  Charges:  $Therapeutic Activity: 23-37 mins                     Rica Koyanagi  PTA Acute  Rehabilitation Services Pager      [phone] Office      [phone]

## 2019-11-25 DIAGNOSIS — M6281 Muscle weakness (generalized): Secondary | ICD-10-CM | POA: Diagnosis not present

## 2019-11-25 DIAGNOSIS — S72001D Fracture of unspecified part of neck of right femur, subsequent encounter for closed fracture with routine healing: Secondary | ICD-10-CM | POA: Diagnosis not present

## 2019-11-25 DIAGNOSIS — I69828 Other speech and language deficits following other cerebrovascular disease: Secondary | ICD-10-CM | POA: Diagnosis not present

## 2019-11-25 DIAGNOSIS — W19XXXA Unspecified fall, initial encounter: Secondary | ICD-10-CM

## 2019-11-25 DIAGNOSIS — Z950 Presence of cardiac pacemaker: Secondary | ICD-10-CM | POA: Diagnosis not present

## 2019-11-25 DIAGNOSIS — E119 Type 2 diabetes mellitus without complications: Secondary | ICD-10-CM

## 2019-11-25 DIAGNOSIS — S32512D Fracture of superior rim of left pubis, subsequent encounter for fracture with routine healing: Secondary | ICD-10-CM | POA: Diagnosis not present

## 2019-11-25 DIAGNOSIS — I959 Hypotension, unspecified: Secondary | ICD-10-CM | POA: Diagnosis not present

## 2019-11-25 DIAGNOSIS — M810 Age-related osteoporosis without current pathological fracture: Secondary | ICD-10-CM | POA: Diagnosis not present

## 2019-11-25 DIAGNOSIS — G243 Spasmodic torticollis: Secondary | ICD-10-CM | POA: Diagnosis not present

## 2019-11-25 DIAGNOSIS — E1122 Type 2 diabetes mellitus with diabetic chronic kidney disease: Secondary | ICD-10-CM | POA: Diagnosis not present

## 2019-11-25 DIAGNOSIS — I442 Atrioventricular block, complete: Secondary | ICD-10-CM | POA: Diagnosis not present

## 2019-11-25 DIAGNOSIS — K219 Gastro-esophageal reflux disease without esophagitis: Secondary | ICD-10-CM | POA: Diagnosis not present

## 2019-11-25 DIAGNOSIS — E1165 Type 2 diabetes mellitus with hyperglycemia: Secondary | ICD-10-CM | POA: Diagnosis not present

## 2019-11-25 DIAGNOSIS — S32591D Other specified fracture of right pubis, subsequent encounter for fracture with routine healing: Secondary | ICD-10-CM | POA: Diagnosis not present

## 2019-11-25 DIAGNOSIS — S32591A Other specified fracture of right pubis, initial encounter for closed fracture: Secondary | ICD-10-CM | POA: Diagnosis not present

## 2019-11-25 DIAGNOSIS — Z9181 History of falling: Secondary | ICD-10-CM | POA: Diagnosis not present

## 2019-11-25 DIAGNOSIS — M542 Cervicalgia: Secondary | ICD-10-CM | POA: Diagnosis not present

## 2019-11-25 DIAGNOSIS — K5903 Drug induced constipation: Secondary | ICD-10-CM | POA: Diagnosis not present

## 2019-11-25 DIAGNOSIS — M2550 Pain in unspecified joint: Secondary | ICD-10-CM | POA: Diagnosis not present

## 2019-11-25 DIAGNOSIS — J069 Acute upper respiratory infection, unspecified: Secondary | ICD-10-CM | POA: Diagnosis not present

## 2019-11-25 DIAGNOSIS — E538 Deficiency of other specified B group vitamins: Secondary | ICD-10-CM | POA: Diagnosis not present

## 2019-11-25 DIAGNOSIS — R410 Disorientation, unspecified: Secondary | ICD-10-CM | POA: Diagnosis not present

## 2019-11-25 DIAGNOSIS — K5901 Slow transit constipation: Secondary | ICD-10-CM | POA: Diagnosis not present

## 2019-11-25 DIAGNOSIS — F321 Major depressive disorder, single episode, moderate: Secondary | ICD-10-CM | POA: Diagnosis not present

## 2019-11-25 DIAGNOSIS — E785 Hyperlipidemia, unspecified: Secondary | ICD-10-CM | POA: Diagnosis not present

## 2019-11-25 DIAGNOSIS — E1142 Type 2 diabetes mellitus with diabetic polyneuropathy: Secondary | ICD-10-CM | POA: Diagnosis not present

## 2019-11-25 DIAGNOSIS — J449 Chronic obstructive pulmonary disease, unspecified: Secondary | ICD-10-CM | POA: Diagnosis not present

## 2019-11-25 DIAGNOSIS — Z7401 Bed confinement status: Secondary | ICD-10-CM | POA: Diagnosis not present

## 2019-11-25 DIAGNOSIS — Z66 Do not resuscitate: Secondary | ICD-10-CM | POA: Diagnosis not present

## 2019-11-25 DIAGNOSIS — Z794 Long term (current) use of insulin: Secondary | ICD-10-CM | POA: Diagnosis not present

## 2019-11-25 DIAGNOSIS — R41841 Cognitive communication deficit: Secondary | ICD-10-CM | POA: Diagnosis not present

## 2019-11-25 DIAGNOSIS — N3941 Urge incontinence: Secondary | ICD-10-CM | POA: Diagnosis not present

## 2019-11-25 DIAGNOSIS — R2689 Other abnormalities of gait and mobility: Secondary | ICD-10-CM | POA: Diagnosis not present

## 2019-11-25 DIAGNOSIS — G8929 Other chronic pain: Secondary | ICD-10-CM | POA: Diagnosis not present

## 2019-11-25 DIAGNOSIS — S32511A Fracture of superior rim of right pubis, initial encounter for closed fracture: Secondary | ICD-10-CM | POA: Diagnosis not present

## 2019-11-25 DIAGNOSIS — R2681 Unsteadiness on feet: Secondary | ICD-10-CM | POA: Diagnosis not present

## 2019-11-25 LAB — GLUCOSE, CAPILLARY: Glucose-Capillary: 266 mg/dL — ABNORMAL HIGH (ref 70–99)

## 2019-11-25 LAB — SARS CORONAVIRUS 2 (TAT 6-24 HRS): SARS Coronavirus 2: NEGATIVE

## 2019-11-25 MED ORDER — LIDOCAINE 5 % EX PTCH
1.0000 | MEDICATED_PATCH | CUTANEOUS | Status: DC
  Filled 2019-11-25: qty 1

## 2019-11-25 MED ORDER — LIDOCAINE 5 % EX PTCH: 1.0000 | MEDICATED_PATCH | CUTANEOUS | 0 refills | Status: DC

## 2019-11-25 NOTE — Progress Notes (Signed)
Pt alert and aware in bed. Her daughter was at the bedside. The pt was happy that I came by to see her. The daughter said that mom would be going to rehab later today and they were so happy. The chaplain offered caring and supportive presence, prayers and blessings in word and song.  No further follow-up.

## 2019-11-25 NOTE — TOC Transition Note (Signed)
Transition of Care [facility]) - CM/SW Discharge Note   Patient Details  Name: Wendy Velasquez MRN: [id number] Date of Birth: 1925-09-02  Transition of [facility]) CM/SW Contact:  Dessa Phi, RN Phone Number: 11/25/2019, 11:46 AM   Clinical Narrative: d/c to [facility] able to accept-PTAR called rm#[phone],nsg call report tel#[phone]. No further CM needs.      Final next level of care: Skilled Nursing Facility Barriers to Discharge: No Barriers Identified   Patient Goals and CMS Choice Patient states their goals for this hospitalization and ongoing recovery are:: go to rehab CMS Medicare.gov Compare Post Acute Care list provided to:: Patient Represenative (must comment) (dtr [name]) Choice offered to / list presented to : Adult Children  Discharge Placement              Patient [facility] bed at: [place] [facility] Patient to be transferred to facility by: [place] Name of family member notified: Pershing Cox [phone] Patient and family notified of of transfer: 11/25/19  Discharge Plan and Services     Post Acute Care Choice: Nursing Home                               Social Determinants of Health (SDOH) Interventions     Readmission Risk Interventions No flowsheet data found.

## 2019-11-25 NOTE — Progress Notes (Signed)
Uneventful night, vital signs stable, no new complaints.  Patient is stable to discharge to skilled nursing facility, detail please refer to discharge summary done by Dr. Verlon Au on November 24, 2019.

## 2019-11-25 NOTE — Care Management Important Message (Signed)
Important Message  Patient Details IM Letter given to the Patient Name: Wendy Velasquez MRN: [id number] Date of Birth: 1925/10/10   Medicare Important Message Given:  Yes     Kerin Salen 11/25/2019, 10:59 AM

## 2019-11-27 ENCOUNTER — Non-Acute Institutional Stay (SKILLED_NURSING_FACILITY): Payer: Medicare Other | Admitting: Internal Medicine

## 2019-11-27 ENCOUNTER — Encounter: Payer: Self-pay | Admitting: Internal Medicine

## 2019-11-27 DIAGNOSIS — K5903 Drug induced constipation: Secondary | ICD-10-CM

## 2019-11-27 DIAGNOSIS — G8929 Other chronic pain: Secondary | ICD-10-CM

## 2019-11-27 DIAGNOSIS — Z794 Long term (current) use of insulin: Secondary | ICD-10-CM | POA: Diagnosis not present

## 2019-11-27 DIAGNOSIS — G243 Spasmodic torticollis: Secondary | ICD-10-CM | POA: Diagnosis not present

## 2019-11-27 DIAGNOSIS — Z66 Do not resuscitate: Secondary | ICD-10-CM | POA: Diagnosis not present

## 2019-11-27 DIAGNOSIS — M542 Cervicalgia: Secondary | ICD-10-CM | POA: Diagnosis not present

## 2019-11-27 DIAGNOSIS — R410 Disorientation, unspecified: Secondary | ICD-10-CM

## 2019-11-27 DIAGNOSIS — E1165 Type 2 diabetes mellitus with hyperglycemia: Secondary | ICD-10-CM | POA: Diagnosis not present

## 2019-11-27 DIAGNOSIS — S32591A Other specified fracture of right pubis, initial encounter for closed fracture: Secondary | ICD-10-CM

## 2019-11-27 DIAGNOSIS — S32511A Fracture of superior rim of right pubis, initial encounter for closed fracture: Secondary | ICD-10-CM

## 2019-11-27 DIAGNOSIS — J449 Chronic obstructive pulmonary disease, unspecified: Secondary | ICD-10-CM

## 2019-11-27 DIAGNOSIS — M810 Age-related osteoporosis without current pathological fracture: Secondary | ICD-10-CM

## 2019-11-27 DIAGNOSIS — T402X5A Adverse effect of other opioids, initial encounter: Secondary | ICD-10-CM

## 2019-11-27 NOTE — Progress Notes (Signed)
Provider:  Rexene Edison. Mariea Clonts, D.O., C.M.D. Location:  [facility] Room Number: 111 Place of Service:  SNF (31)  PCP: Tisovec, Fransico Him, MD Patient Care Team: Tisovec, Fransico Him, MD as PCP - General (Internal Medicine) Mayo, Darla Lesches, PA-C (Physician Assistant)  Extended Emergency Contact Information Primary Emergency Contact: [name],[name] Address: [address]           [place], [place] [address] Johnnette Litter of [place] Phone: [phone] Mobile Phone: [phone] Relation: Daughter Secondary Emergency Contact: Mitchell,Bunny Address: [address]          [place], [place] [address] Johnnette Litter of [place] Phone: [phone] Mobile Phone: [phone] Relation: Daughter  Code Status: DNR Goals of Care: Advanced Directive information Advanced Directives 11/22/2019  Does Patient Have a Medical Advance Directive? Yes  Type of Advance Directive [place]  Does patient want to make changes to medical advance directive? No - Patient declined  Copy of [place] in Chart? No - copy requested  Would patient like information on creating a medical advance directive? -   Chief Complaint  Patient presents with  . New Admit To SNF    New Admit     HPI: Patient is a 86 y.o. female seen today for admission to [facility] re[facility] status post hospitalization at [facility] from September 11 through September 15 with a fall and right hip pain.  She was found to have a closed fracture of her right pelvis.  She has a medical history significant for prior Covid infection in January of this year, cervical dystonia, type 2 diabetes with hyperglycemia, COPD, anorexia, prior complete heart block with a cardiac arrest, pacemaker placement, senile osteoporosis, gastroesophageal reflux disease, severe protein calorie malnutrition deconditioning, and others.  She had been living independently at [facility]  until 2 months ago when she began having more falls.  They were both transferred to assisted living at [facility].  She had been ambulating with her walker when she fell striking her head and her right hip.  She was unable to bear weight and experienced immediate pain.  She was brought to the hospital September 12.  She remained unable to walk.  She indicated that her fall was unprovoked and she was completely asymptomatic prior to the event the emergency department she was given Tylenol and IV fentanyl.  Her home meds were resumed.  Her admission hemoglobin was 10 consistent with prior.  Hip x-rays revealed acute appearing comminuted fractures of her right superior and inferior pubic rami.  PT was consulted for evaluation.  She also had sacral insufficiency fractures.  She was discharged here with tramadol for pain and Norco if pain unrelieved with tramadol.  Regarding her type 2 diabetes she is receiving Lantus 12 units daily and Metformin 1000 mg p.o. twice daily with meals.  No sliding scale was used due to lower sugars and A1c goal over 8.  Lantus was later discontinued due to CBGs in the 25s.  She is on suppressive trimethoprim due to frequent urinary tract infections.  She had a negative Covid test on September 12..  She did receive her Moderna Covid vaccines April 14 and May 12.  When seen, she was accompanied by her husband and two daughters who I knew from caring for their aunt.  She was reporting considerable pain.  She has chronic neck pain and this was acting up since she is soon due for her botox  with Dr. Krista Blue.    She also indicated no bm since Saturday before her fall.  She's only eating 3-4 bites of food at a time with help.  Nursing was already on this giving her stool softener and following standing orders.  Other concern is that she's been hallucinating with the hydrocodone or tramadol (not clear which).  Nursing had wanted her meds scheduled b/c she does not request them, but she  is already delirious.  Past Medical History:  Diagnosis Date  . Asthma   . Cardiac pacemaker in situ   . Complete heart block ([place])   . COPD (chronic obstructive pulmonary disease) ([place])   . Diverticulosis of colon   . GERD (gastroesophageal reflux disease)   . Glaucoma of both eyes   . Heart disease   . History of cardiac arrest    09-11-2012--  symptomatic complete heart block -- hr 20's--  cpr, intubated and temp. pacer until  perm. pacemaker placement  . History of esophageal dilatation    for stricture 2007  . History of hiatal hernia   . History of iron deficiency anemia    hx transfusion's 15 yrs ago , approx 2001  . Hyperlipidemia   . Hypertension   . Neck pain   . Peripheral neuropathy   . Tachy-brady syndrome ([place])   . Type 2 diabetes mellitus ([place])   . Urgency incontinence    Botox injections for bladder.  . Wears glasses    Past Surgical History:  Procedure Laterality Date  . ABDOMINAL HYSTERECTOMY  1976  approx   w/ unilateral salpingoophorectomy  . CATARACT EXTRACTION W/ INTRAOCULAR LENS  IMPLANT, BILATERAL  1990  . COLONOSCOPY WITH ESOPHAGOGASTRODUODENOSCOPY (EGD)  last one 2007  . INTERSTIM IMPLANT PLACEMENT  2012  . INTERSTIM IMPLANT REMOVAL N/A 09/21/2014   Procedure: REMOVAL OF INTERSTIM IMPLANT;  Surgeon: Bjorn Loser, MD;  Location: [facility];  Service: Urology;  Laterality: N/A;  . INTERSTIM IMPLANT REVISION N/A 09/21/2014   Procedure: Barrie Lyme STAGE ONE AND TWO;  Surgeon: Bjorn Loser, MD;  Location: [facility];  Service: Urology;  Laterality: N/A;  . LAPAROTOMY W/ UNILATERAL SALPINGOOPHORECTOMY  1960's  approx  . PERMANENT PACEMAKER INSERTION N/A 09/11/2012   Procedure: PERMANENT PACEMAKER INSERTION;  Surgeon: Evans Lance, MD;  Location: [facility] CATH LAB;  Service: Cardiovascular;  Laterality: N/A;  St. Jude Dual Chamber  . RECTOCELE REPAIR  1991  approx    Social History   Socioeconomic History  . Marital  status: Married    Spouse name: Not on file  . Number of children: 3  . Years of education: 55  . Highest education level: High school graduate  Occupational History  . Occupation: Retired  Tobacco Use  . Smoking status: Never Smoker  . Smokeless tobacco: Never Used  Vaping Use  . Vaping Use: Never used  Substance and Sexual Activity  . Alcohol use: No  . Drug use: No  . Sexual activity: Not Currently    Birth control/protection: None  Other Topics Concern  . Not on file  Social History Narrative   Lives with her husband in an independent living facility.   Right-handed.   1-2 cup caffeine daily.   Social Determinants of Health   Financial Resource Strain:   . Difficulty of Paying Living Expenses: Not on file  Food Insecurity:   . Worried About Charity fundraiser in the Last Year: Not on file  . Ran Out of Food in  the Last Year: Not on file  Transportation Needs:   . Lack of Transportation (Medical): Not on file  . Lack of Transportation (Non-Medical): Not on file  Physical Activity:   . Days of Exercise per Week: Not on file  . Minutes of Exercise per Session: Not on file  Stress:   . Feeling of Stress : Not on file  Social Connections:   . Frequency of Communication with Friends and Family: Not on file  . Frequency of Social Gatherings with Friends and Family: Not on file  . Attends Religious Services: Not on file  . Active Member of Clubs or Organizations: Not on file  . Attends Archivist Meetings: Not on file  . Marital Status: Not on file    reports that she has never smoked. She has never used smokeless tobacco. She reports that she does not drink alcohol and does not use drugs.  Functional Status Survey:    Family History  Problem Relation Age of Onset  . Other Mother        died during her childbirth  . Prostatitis Father   . Heart failure Sister   . Breast cancer Sister   . Diabetic kidney disease Sister   . Melanoma Brother      Health Maintenance  Topic Date Due  . FOOT EXAM  Never done  . OPHTHALMOLOGY EXAM  Never done  . URINE MICROALBUMIN  Never done  . COVID-19 Vaccine (1) Never done  . TETANUS/TDAP  Never done  . DEXA SCAN  Never done  . PNA vac Low Risk Adult (1 of 2 - PCV13) Never done  . INFLUENZA VACCINE  10/11/2019  . HEMOGLOBIN A1C  10/16/2019    No Known Allergies  Outpatient Encounter Medications as of 11/27/2019  Medication Sig  . albuterol (PROVENTIL) (2.5 MG/3ML) 0.083% nebulizer solution Take 2.5 mg by nebulization every 6 (six) hours as needed for wheezing or shortness of breath.  Marland Kitchen aspirin EC 81 MG tablet Take 81 mg by mouth daily.  . Brinzolamide-Brimonidine (SIMBRINZA) 1-0.2 % SUSP Apply 1 drop to eye at bedtime.  . cetirizine (ZYRTEC) 10 MG tablet Take 10 mg by mouth daily as needed for allergies.   . Esomeprazole Magnesium (NEXIUM PO) Take 1 tablet by mouth daily.  . fluticasone (FLONASE) 50 MCG/ACT nasal spray Place 2 sprays into both nostrils daily as needed for allergies. SHAKE AS DIRECTED   . Fluticasone-Salmeterol (ADVAIR DISKUS) 250-50 MCG/DOSE AEPB Inhale 1 puff into the lungs 2 (two) times daily. Inhale 1 puff into  lungs twice daily gargle and rinse your mouth with water after each use of this medication.  . gabapentin (NEURONTIN) 100 MG capsule Take 2 capsules in am, 2 capsules midday, 1 capsule at bedtime.  Marland Kitchen HYDROcodone-acetaminophen (NORCO/VICODIN) 5-325 MG tablet Take 1-2 tablets by mouth every 4 (four) hours as needed for moderate pain.  Marland Kitchen ibandronate (BONIVA) 150 MG tablet Take 150 mg by mouth every 30 (thirty) days. Take in the morning with a full glass of water, on an empty stomach, and do not take anything else by mouth or lie down for the next 30 min.  . insulin glargine (LANTUS) 100 UNIT/ML injection Inject 12 Units into the skin at bedtime. 9 pm daily  . lidocaine (LIDODERM) 5 % Place 1 patch onto the skin daily. Remove & Discard patch within 12 hours or as  directed by MD  . lidocaine-prilocaine (EMLA) cream Apply 1 application topically 4 (four) times daily as needed. (  Patient taking differently: Apply 1 application topically 4 (four) times daily as needed (pain). )  . metFORMIN (GLUCOPHAGE) 1000 MG tablet Take 1,000 mg by mouth 2 (two) times daily with a meal.  . MYRBETRIQ 25 MG TB24 tablet Take 25 mg by mouth daily.   . polyethylene glycol (MIRALAX / GLYCOLAX) 17 g packet Take 17 g by mouth daily.  Marland Kitchen senna-docusate (SENOKOT-S) 8.6-50 MG tablet Take 1 tablet by mouth daily.  . sertraline (ZOLOFT) 50 MG tablet Take 1 tablet (50 mg total) by mouth 2 (two) times daily.  . traMADol (ULTRAM) 50 MG tablet Take 1 tablet (50 mg total) by mouth every 8 (eight) hours.  Marland Kitchen trimethoprim (TRIMPEX) 100 MG tablet Take 1 tablet (100 mg total) by mouth daily. OK TO RESUME AFTER YOU FINISH CURRENT ANTIBIOTIC THERAPY.  . vitamin B-12 (CYANOCOBALAMIN) 1000 MCG tablet Take 1,000 mcg by mouth daily.  . [DISCONTINUED] albuterol-ipratropium (COMBIVENT) 18-103 MCG/ACT inhaler Inhale 2 puffs into the lungs every 8 (eight) hours as needed for wheezing.   . [DISCONTINUED] botulinum toxin Type A (BOTOX) 100 units SOLR injection Inject 100 Units into the muscle every 3 (three) months.  . [DISCONTINUED] Fluticasone-Salmeterol (ADVAIR DISKUS) 250-50 MCG/DOSE AEPB Inhale 1 puff into the lungs 2 (two) times daily.   Facility-Administered Encounter Medications as of 11/27/2019  Medication  . botulinum toxin Type A (BOTOX) injection 100 Units    Review of Systems  Constitutional: Positive for malaise/fatigue and weight loss. Negative for chills and fever.  HENT: Negative for congestion and sore throat.   Eyes: Negative for blurred vision.  Respiratory: Negative for cough and shortness of breath.   Cardiovascular: Negative for chest pain, palpitations and leg swelling.  Gastrointestinal: Positive for constipation. Negative for abdominal pain, blood in stool, diarrhea and melena.   Genitourinary: Negative for dysuria.       Recurrent UTIs  Musculoskeletal: Positive for falls, myalgias and neck pain.  Skin: Negative for rash.  Neurological: Positive for weakness. Negative for dizziness and loss of consciousness.  Endo/Heme/Allergies: Bruises/bleeds easily.  Psychiatric/Behavioral: Positive for hallucinations. Negative for depression. The patient is not nervous/anxious.        Does not normally have hallucinations (delirium)    Vitals:   11/27/19 1120  BP: (!) 103/58  Pulse: 81  Temp: (!) 97.4 F (36.3 C)  TempSrc: Temporal  Weight: 122 lb 5.8 oz (55.5 kg)  Height: 5' (1.524 m)   Body mass index is 23.9 kg/m. Physical Exam Constitutional:      General: She is not in acute distress.    Appearance: She is not toxic-appearing.     Comments: A little drowsy from pain med received 30 mins ago and talking about going sledding/seeing snow above my head as we talked (it's September); thin female sitting up in bed  HENT:     Head: Normocephalic and atraumatic.     Right Ear: External ear normal.     Left Ear: External ear normal.     Nose: Nose normal.     Mouth/Throat:     Pharynx: Oropharynx is clear.  Eyes:     Extraocular Movements: Extraocular movements intact.     Conjunctiva/sclera: Conjunctivae normal.     Pupils: Pupils are equal, round, and reactive to light.  Cardiovascular:     Rate and Rhythm: Normal rate and regular rhythm.     Pulses: Normal pulses.     Heart sounds: Normal heart sounds.  Pulmonary:     Effort: Pulmonary effort is  normal.     Breath sounds: Normal breath sounds. No wheezing, rhonchi or rales.  Abdominal:     General: Bowel sounds are normal. There is no distension.     Palpations: Abdomen is soft.     Tenderness: There is no abdominal tenderness. There is no guarding or rebound.  Musculoskeletal:        General: Tenderness present.     Cervical back: Neck supple.     Right lower leg: No edema.     Left lower leg: No  edema.     Comments: Tender in right groin  Skin:    Comments: Ecchymoses on right buttocks/back/hip region  Neurological:     General: No focal deficit present.     Mental Status: She is oriented to person, place, and time.     Cranial Nerves: No cranial nerve deficit.     Motor: Weakness present.     Gait: Gait abnormal.  Psychiatric:        Mood and Affect: Mood normal.        Behavior: Behavior normal.     Labs reviewed: Basic Metabolic Panel: Recent Labs    04/22/19 0316 04/22/19 0316 11/22/19 0226 11/22/19 1900 11/23/19 0523  NA 139  --  138  --  137  K 4.3  --  4.3  --  4.5  CL 102  --  101  --  101  CO2 27  --  29  --  26  GLUCOSE 237*  --  175*  --  107*  BUN 32*  --  26*  --  26*  CREATININE 0.71   < > 0.68 0.75 0.60  CALCIUM 8.9  --  9.0  --  8.6*   < > = values in this interval not displayed.   Liver Function Tests: Recent Labs    04/21/19 0417 04/22/19 0316 11/23/19 0523  AST 9* 13* 14*  ALT 12 12 15   ALKPHOS 66 68 47  BILITOT 0.5 0.6 0.4  PROT 5.7* 5.7* 5.4*  ALBUMIN 2.5* 2.4* 3.1*   Recent Labs    04/17/19 1321  LIPASE 18   No results for input(s): AMMONIA in the last 8760 hours. CBC: Recent Labs    04/17/19 1321 04/18/19 0630 11/22/19 0226 11/22/19 1900 11/23/19 0523  WBC 9.9   < > 8.9 9.6 10.2  NEUTROABS 7.3  --  6.4  --   --   HGB 12.0   < > 10.1* 9.7* 10.1*  HCT 36.6   < > 31.9* 31.3* 31.6*  MCV 87.6   < > 89.1 91.5 89.8  PLT 373   < > 222 205 202   < > = values in this interval not displayed.   Cardiac Enzymes: No results for input(s): CKTOTAL, CKMB, CKMBINDEX, TROPONINI in the last 8760 hours. BNP: Invalid input(s): POCBNP Lab Results  Component Value Date   HGBA1C 6.9 (H) 04/18/2019   Lab Results  Component Value Date   TSH 1.767 04/17/2019   No results found for: VITAMINB12 No results found for: FOLATE No results found for: IRON, TIBC, FERRITIN  Imaging and Procedures obtained prior to SNF admission: CT Head  Wo Contrast  Result Date: 11/22/2019 CLINICAL DATA:  Head and neck pain after a fall EXAM: CT HEAD WITHOUT CONTRAST CT CERVICAL SPINE WITHOUT CONTRAST TECHNIQUE: Multidetector CT imaging of the head and cervical spine was performed following the standard protocol without intravenous contrast. Multiplanar CT image reconstructions of the cervical spine were also  generated. COMPARISON:  CT cervical spine 12/10/2017 FINDINGS: CT HEAD FINDINGS Brain: Diffuse cerebral atrophy. Ventricular dilatation consistent with central atrophy. Low-attenuation changes in the deep white matter consistent with small vessel ischemia. No abnormal extra-axial fluid collections. No mass effect or midline shift. Gray-white matter junctions are distinct. Basal cisterns are not effaced. No acute intracranial hemorrhage. Vascular: Moderate intracranial arterial vascular calcifications. Skull: Calvarium appears intact. Sinuses/Orbits: Paranasal sinuses and mastoid air cells are clear. Other: None. CT CERVICAL SPINE FINDINGS Alignment: Slight anterior subluxations demonstrated at C3-4 and C4-5 levels, unchanged since prior study. This is likely degenerative. Normal alignment of the posterior elements. C1-2 articulation appears intact. Skull base and vertebrae: Skull base appears intact. No vertebral compression deformities. No focal bone lesion or bone destruction. Ligamentous calcifications associated with C1-2. Soft tissues and spinal canal: No prevertebral soft tissue swelling. No abnormal paraspinal soft tissue mass or infiltration. Vascular calcifications. Disc levels: Degenerative changes throughout the cervical spine with narrowed interspaces and endplate hypertrophic change. Degenerative changes are most prominent at C4-5, C5-6, and C6-7 levels. Degenerative changes in the posterior facet joints. Facet joint hypertrophy and uncovertebral spurring causes encroachment of the neural foramina at multiple levels bilaterally. Upper chest:  Lung apices are clear. Other: None. IMPRESSION: 1. No acute intracranial abnormalities. Chronic atrophy and small vessel ischemic changes. 2. Slight anterior subluxations at C3-4 and C4-5 levels are unchanged since prior study and likely degenerative. Degenerative changes throughout the cervical spine. No acute displaced fractures identified. Electronically Signed   By: Lucienne Capers M.D.   On: 11/22/2019 00:38   CT Cervical Spine Wo Contrast  Result Date: 11/22/2019 CLINICAL DATA:  Head and neck pain after a fall EXAM: CT HEAD WITHOUT CONTRAST CT CERVICAL SPINE WITHOUT CONTRAST TECHNIQUE: Multidetector CT imaging of the head and cervical spine was performed following the standard protocol without intravenous contrast. Multiplanar CT image reconstructions of the cervical spine were also generated. COMPARISON:  CT cervical spine 12/10/2017 FINDINGS: CT HEAD FINDINGS Brain: Diffuse cerebral atrophy. Ventricular dilatation consistent with central atrophy. Low-attenuation changes in the deep white matter consistent with small vessel ischemia. No abnormal extra-axial fluid collections. No mass effect or midline shift. Gray-white matter junctions are distinct. Basal cisterns are not effaced. No acute intracranial hemorrhage. Vascular: Moderate intracranial arterial vascular calcifications. Skull: Calvarium appears intact. Sinuses/Orbits: Paranasal sinuses and mastoid air cells are clear. Other: None. CT CERVICAL SPINE FINDINGS Alignment: Slight anterior subluxations demonstrated at C3-4 and C4-5 levels, unchanged since prior study. This is likely degenerative. Normal alignment of the posterior elements. C1-2 articulation appears intact. Skull base and vertebrae: Skull base appears intact. No vertebral compression deformities. No focal bone lesion or bone destruction. Ligamentous calcifications associated with C1-2. Soft tissues and spinal canal: No prevertebral soft tissue swelling. No abnormal paraspinal soft  tissue mass or infiltration. Vascular calcifications. Disc levels: Degenerative changes throughout the cervical spine with narrowed interspaces and endplate hypertrophic change. Degenerative changes are most prominent at C4-5, C5-6, and C6-7 levels. Degenerative changes in the posterior facet joints. Facet joint hypertrophy and uncovertebral spurring causes encroachment of the neural foramina at multiple levels bilaterally. Upper chest: Lung apices are clear. Other: None. IMPRESSION: 1. No acute intracranial abnormalities. Chronic atrophy and small vessel ischemic changes. 2. Slight anterior subluxations at C3-4 and C4-5 levels are unchanged since prior study and likely degenerative. Degenerative changes throughout the cervical spine. No acute displaced fractures identified. Electronically Signed   By: Lucienne Capers M.D.   On: 11/22/2019 00:38   DG Hip  Unilat W or Wo Pelvis 2-3 Views Right  Result Date: 11/22/2019 CLINICAL DATA:  Right hip pain after a fall EXAM: DG HIP (WITH OR WITHOUT PELVIS) 2-3V RIGHT COMPARISON:  None. FINDINGS: Acute appearing comminuted fractures of the right superior and inferior pubic rami with mild displacement. The right hip appears otherwise intact. No evidence of acute fracture or dislocation in the hip joint. Visualized sacrum appears intact. SI joints and symphysis pubis are not displaced. Vascular calcifications. Sacral stimulator device. IMPRESSION: Acute appearing comminuted fractures of the right superior and inferior pubic rami. Electronically Signed   By: Lucienne Capers M.D.   On: 11/22/2019 00:23    Assessment/Plan 1. Inferior pubic ramus fracture, right, closed, initial encounter ([place]) -will add scheduled tylenol 500mg  po tid for 14 days -continue PT, OT -cont with tramadol for moderate pain and norco for severe pain short-term--wean as able especially in view of her delirium  2. Closed fracture of superior ramus of right pubis, initial encounter ([place]) -as  above  3. Acute delirium -hallucinating, seems related to mix of pain meds, pain itself, being in different place  4. Cervical dystonia -add warm compresses to neck and scheduled tylenol  5. Chronic neck pain -as in #4, f/u with Dr. Krista Blue for botox as planned  6. Type 2 diabetes mellitus with hyperglycemia, with long-term current use of insulin (HCC) -well controlled, cont lantus 12 units qhs, metformin 1000mg  po bid with meals (may need to reduce if intake remains poor and losing weight) Lab Results  Component Value Date   HGBA1C 6.9 (H) 04/18/2019    7. Chronic obstructive pulmonary disease, unspecified COPD type ([place]) -continues on advair, has prn albuterol nebs, apparently no longer on her combivent inhaler?  8. Senile osteoporosis -should ideally be on vitamin D3 and osteoporosis medication upon discharge--f/u with PCP re: this   9. Therapeutic opioid induced constipation -cont miralax daily, senokot daily, and standing orders if needed  10. DNR (do not resuscitate) - Do not attempt resuscitation (DNR)  Family/ staff Communication:  Discussed with snf nurse, 2 daughters and husband  Labs/tests ordered:  Cbc, bmp at one week  Chrissie Dacquisto L. Donnavin Vandenbrink, D.O. [facility] [address], [place] [address] Cell Phone (Mon-Fri 8am-5pm):  [phone] On Call:  [phone] & follow prompts after 5pm & weekends Office Phone:  [phone] Office Fax:  [phone]

## 2019-12-01 ENCOUNTER — Other Ambulatory Visit: Payer: Self-pay | Admitting: *Deleted

## 2019-12-01 NOTE — Patient Outreach (Addendum)
Member screened for potential [facility] Care Management needs as a benefit of [place] [employer].  Verified in Patient Wendy Velasquez that member resides in [facility]. Appears member is from ALF.   Communication sent to [facility] SW to collaborate about transition plans.  Marthenia Rolling, MSN-Ed, RN,BSN [place] Acute Care Coordinator [phone] [facility]) [phone]  (Toll free office)

## 2019-12-02 ENCOUNTER — Telehealth: Payer: Self-pay | Admitting: Adult Health

## 2019-12-02 LAB — CBC AND DIFFERENTIAL
HCT: 29 — AB (ref 36–46)
Hemoglobin: 9.3 — AB (ref 12.0–16.0)
Platelets: 456 — AB (ref 150–399)
WBC: 7.7

## 2019-12-02 LAB — BASIC METABOLIC PANEL
BUN: 18 (ref 4–21)
CO2: 27 — AB (ref 13–22)
Chloride: 102 (ref 99–108)
Creatinine: 0.6 (ref 0.5–1.1)
Glucose: 125
Potassium: 4.6 (ref 3.4–5.3)
Sodium: 138 (ref 137–147)

## 2019-12-02 LAB — HEPATIC FUNCTION PANEL
ALT: 8 (ref 7–35)
AST: 11 — AB (ref 13–35)

## 2019-12-02 LAB — COMPREHENSIVE METABOLIC PANEL
Albumin: 3.1 — AB (ref 3.5–5.0)
Calcium: 9.1 (ref 8.7–10.7)

## 2019-12-02 LAB — CBC: RBC: 3.23 — AB (ref 3.87–5.11)

## 2019-12-02 NOTE — Telephone Encounter (Signed)
Nurse from [facility] called to report CBC results for this resident. There are no acute changes to her care and her vitals are stable. Hgb was 9.3,  Hct 28.5, plt 456, and WBC 7.7. No bleeding is reported. Pt is s/p pelvic fracture.  Hgb is slightly lower than previous values checked 1 week ago. No orders given at this time. Will forward to provider at facility.

## 2019-12-04 DIAGNOSIS — E1122 Type 2 diabetes mellitus with diabetic chronic kidney disease: Secondary | ICD-10-CM | POA: Diagnosis not present

## 2019-12-09 ENCOUNTER — Ambulatory Visit (INDEPENDENT_AMBULATORY_CARE_PROVIDER_SITE_OTHER): Payer: Medicare Other | Admitting: Neurology

## 2019-12-09 ENCOUNTER — Encounter: Payer: Self-pay | Admitting: Neurology

## 2019-12-09 VITALS — BP 106/67 | HR 82

## 2019-12-09 DIAGNOSIS — M542 Cervicalgia: Secondary | ICD-10-CM

## 2019-12-09 DIAGNOSIS — G243 Spasmodic torticollis: Secondary | ICD-10-CM

## 2019-12-09 NOTE — Progress Notes (Signed)
PATIENT: Wendy Velasquez DOB: 1925-11-29  Chief Complaint  Patient presents with  . Cervical Dystonia    Botox 100 units      HISTORICAL  Wendy Velasquez is a 86 years old female, seen in request by her primary care physician Dr. Osborne Casco, Frans[place] Him for evaluation of neck pain, headaches, initial evaluation was on December 05, 2017.  She is accompanied by her daughter at today's clinical visit.  I have reviewed and summarized the referring note from the referring physician, she has past medical history of diabetes, use insulin, history of cardiac block, status post pacemaker, hyperlipidemia,  She had a history of chronic neck pain, had acute worsening since March 2018, she woke up one morning, noticed upper neck throbbing pain, radiating pain to her skull, has been persistent since then, over the last 1 year, she was seen by orthopedic PA, Ronette Deter, was treated with multiple trigger point injection, with mixture of 1% lidocaine, and Depo-Medrol, most recent injection was on October 10, 2017, on a monthly basis, patient denies significant improvement, also complains of worsening glucose control after injection,  She denies significant radiating pain to bilateral shoulder upper extremity, denies difficulty using arms, she had chronic urinary incontinence is receiving Botox injection by urologist, she contributed to her gait abnormality to her bilateral knee pain, chronic arthritis,  She now complains of difficulty turning her neck, 6 out of 10 constant deep achy pain, multiple tender spots at cervical, upper trapezius, oftentimes she has transient sharp radiating range of motion, difficulty turning her neck,  She also tried physical therapy, is doing morning neck traction regularly,  UPDATE Jan 02 2018: She continue complains of significant neck pain, 8 out of 10 sometimes, radiating pain to bilateral occipital region despite gabapentin 100 mg 5 tablets each day, I have personally  reviewed CT cervical spine on December 10, 2017, no acute abnormality, multilevel spondylolisthesis, maximum at C4-5, with associated   facet arthropathy, evidence of ankylosis, mild spinal stenosis at the C2-3, C3-4 level  UPDATE Apr 08 2018: She responded very well to her initial injection January 02, 2018, we used 100 units of Botox, a month after injection she was pain-free for 2 months, there was no significant side effect noticed, in the past 2 weeks, she noticed returning of her neck pain, centered at bilateral mastoid process, nuchal line, tenderness upon deep palpitation.  UPDATE August 12 2018: She responded very well to previous injection  UPDATE Sept 9 2020: She responded well to previous injection, we used Botox a 100 units, is take about 3 days for the benefit to kicking, lasting for 2 to 3 months, no significant side effect.  UPDATE Feb 25 2019: She responded well to previous injection  UPDATE June 01 2019: She suffered COVID-29 January 2020, required hospitalization but no intubation, she complains of increased bilateral upper back pain, neck pain.  Update September 09 2019 She complains of worsening neck pain, she was admitted to hospital in early February for acute hypoxic respiratory failure due to COVID-19 pneumonia, she was treated with remdesivir, did not require intubation, however, since the infection, she complains of declining of her status, could no longer walking, rely on wheelchair, fatigue,  UPDATE Set 29 2021: She is with her daughter at today's clinical visit, suffered pelvic fracture, Botox low dose 100 units continue to help her neck muscle pain, spasm  REVIEW OF SYSTEMS: Full 14 system review of systems performed and notable only for as above. ALLERGIES:  No Known Allergies  HOME MEDICATIONS: Current Outpatient Medications  Medication Sig Dispense Refill  . albuterol (PROVENTIL) (2.5 MG/3ML) 0.083% nebulizer solution Take 2.5 mg by nebulization every 6 (six)  hours as needed for wheezing or shortness of breath.    Marland Kitchen aspirin EC 81 MG tablet Take 81 mg by mouth daily.    . Brinzolamide-Brimonidine (SIMBRINZA) 1-0.2 % SUSP Apply 1 drop to eye at bedtime.    . cetirizine (ZYRTEC) 10 MG tablet Take 10 mg by mouth daily as needed for allergies.     . Esomeprazole Magnesium (NEXIUM PO) Take 1 tablet by mouth daily.    . fluticasone (FLONASE) 50 MCG/ACT nasal spray Place 2 sprays into both nostrils daily as needed for allergies. SHAKE AS DIRECTED     . Fluticasone-Salmeterol (ADVAIR DISKUS) 250-50 MCG/DOSE AEPB Inhale 1 puff into the lungs 2 (two) times daily. Inhale 1 puff into  lungs twice daily gargle and rinse your mouth with water after each use of this medication.    . gabapentin (NEURONTIN) 100 MG capsule Take 2 capsules in am, 2 capsules midday, 1 capsule at bedtime. 450 capsule 3  . HYDROcodone-acetaminophen (NORCO/VICODIN) 5-325 MG tablet Take 1-2 tablets by mouth every 4 (four) hours as needed for moderate pain. 20 tablet 0  . insulin glargine (LANTUS) 100 UNIT/ML injection Inject 12 Units into the skin at bedtime. 9 pm daily    . lidocaine-prilocaine (EMLA) cream Apply 1 application topically 4 (four) times daily as needed. 30 g 11  . metFORMIN (GLUCOPHAGE) 1000 MG tablet Take 1,000 mg by mouth 2 (two) times daily with a meal.    . MYRBETRIQ 25 MG TB24 tablet Take 25 mg by mouth daily.   6  . polyethylene glycol (MIRALAX / GLYCOLAX) 17 g packet Take 17 g by mouth daily. 14 each 0  . senna-docusate (SENOKOT-S) 8.6-50 MG tablet Take 1 tablet by mouth daily.    . sertraline (ZOLOFT) 50 MG tablet Take 1 tablet (50 mg total) by mouth 2 (two) times daily. 5 tablet 0  . traMADol (ULTRAM) 50 MG tablet Take 1 tablet (50 mg total) by mouth every 8 (eight) hours. 20 tablet 0  . trimethoprim (TRIMPEX) 100 MG tablet Take 1 tablet (100 mg total) by mouth daily. OK TO RESUME AFTER YOU FINISH CURRENT ANTIBIOTIC THERAPY.    . vitamin B-12 (CYANOCOBALAMIN) 1000 MCG  tablet Take 1,000 mcg by mouth daily.     No current facility-administered medications for this visit.    PAST MEDICAL HISTORY: Past Medical History:  Diagnosis Date  . Asthma   . Cardiac pacemaker in situ   . Complete heart block ([place])   . COPD (chronic obstructive pulmonary disease) ([place])   . Diverticulosis of colon   . GERD (gastroesophageal reflux disease)   . Glaucoma of both eyes   . Heart disease   . History of cardiac arrest    09-11-2012--  symptomatic complete heart block -- hr 20's--  cpr, intubated and temp. pacer until  perm. pacemaker placement  . History of esophageal dilatation    for stricture 2007  . History of hiatal hernia   . History of iron deficiency anemia    hx transfusion's 15 yrs ago , approx 2001  . Hyperlipidemia   . Hypertension   . Neck pain   . Peripheral neuropathy   . Tachy-brady syndrome ([place])   . Type 2 diabetes mellitus ([place])   . Urgency incontinence    Botox injections for bladder.  Marland Kitchen  Wears glasses     PAST SURGICAL HISTORY: Past Surgical History:  Procedure Laterality Date  . ABDOMINAL HYSTERECTOMY  1976  approx   w/ unilateral salpingoophorectomy  . CATARACT EXTRACTION W/ INTRAOCULAR LENS  IMPLANT, BILATERAL  1990  . COLONOSCOPY WITH ESOPHAGOGASTRODUODENOSCOPY (EGD)  last one 2007  . INTERSTIM IMPLANT PLACEMENT  2012  . INTERSTIM IMPLANT REMOVAL N/A 09/21/2014   Procedure: REMOVAL OF INTERSTIM IMPLANT;  Surgeon: Bjorn Loser, MD;  Location: [facility];  Service: Urology;  Laterality: N/A;  . INTERSTIM IMPLANT REVISION N/A 09/21/2014   Procedure: Barrie [place] STAGE ONE AND TWO;  Surgeon: Bjorn Loser, MD;  Location: [facility];  Service: Urology;  Laterality: N/A;  . LAPAROTOMY W/ UNILATERAL SALPINGOOPHORECTOMY  1960's  approx  . PERMANENT PACEMAKER INSERTION N/A 09/11/2012   Procedure: PERMANENT PACEMAKER INSERTION;  Surgeon: Evans Lance, MD;  Location: [facility] CATH LAB;  Service: Cardiovascular;   Laterality: N/A;  St. Jude Dual Chamber  . RECTOCELE REPAIR  1991  approx    FAMILY HISTORY: Family History  Problem Relation Age of Onset  . Other Mother        died during her childbirth  . Prostatitis Father   . Heart failure Sister   . Breast cancer Sister   . Diabetic kidney disease Sister   . Melanoma Brother     SOCIAL HISTORY: Social History   Socioeconomic History  . Marital status: Married    Spouse name: Not on file  . Number of children: 3  . Years of education: 52  . Highest education level: High school graduate  Occupational History  . Occupation: Retired  Tobacco Use  . Smoking status: Never Smoker  . Smokeless tobacco: Never Used  Vaping Use  . Vaping Use: Never used  Substance and Sexual Activity  . Alcohol use: No  . Drug use: No  . Sexual activity: Not Currently    Birth control/protection: None  Other Topics Concern  . Not on file  Social History Narrative   Lives with her husband in an independent living facility.   Right-handed.   1-2 cup caffeine daily.   Social Determinants of Health   Financial Resource Strain:   . Difficulty of Paying Living Expenses: Not on file  Food Insecurity:   . Worried About Charity fundraiser in the Last Year: Not on file  . Ran Out of Food in the Last Year: Not on file  Transportation Needs:   . Lack of Transportation (Medical): Not on file  . Lack of Transportation (Non-Medical): Not on file  Physical Activity:   . Days of Exercise per Week: Not on file  . Minutes of Exercise per Session: Not on file  Stress:   . Feeling of Stress : Not on file  Social Connections:   . Frequency of Communication with Friends and Family: Not on file  . Frequency of Social Gatherings with Friends and Family: Not on file  . Attends Religious Services: Not on file  . Active Member of Clubs or Organizations: Not on file  . Attends Archivist Meetings: Not on file  . Marital Status: Not on file  Intimate  Partner Violence:   . Fear of Current or Ex-Partner: Not on file  . Emotionally Abused: Not on file  . Physically Abused: Not on file  . Sexually Abused: Not on file     PHYSICAL EXAM  NEUROLOGICAL EXAM: Multiple posterior neck muscle spasm, especially at bilateral levator scapula,  latissimus longus, scapular cervix,  DIAGNOSTIC DATA (LABS, IMAGING, TESTING) - I reviewed patient records, labs, notes, testing and imaging myself where available.   ASSESSMENT AND PLAN  Wendy Velasquez is a 86 y.o. female    Chronic neck pain, headaches Abnormal neck posturing, mild retrocollis, left tilt, significant tenderness at bilateral lateral cervical paraspinal muscles, bilateral levator scapular   Worsening gait abnormalities  She has significant hyperreflexia on examination,  CT cervical showed evidence of multilevel spondylolisthesis, maximum at C4-5 with associated facet arthropathy, developing ankylosis  To evaluate her worsening gait abnormality, I have suggested CT myelogram, not MRI candidate due to pacemaker placement, her daughter will discuss with rest of family members, will call back if decided to proceed   Her significant neck pain had tried and failed multiple treatment in the past, including neck traction, heating pad, trigger point injection, gabapentin, responded very well to EMG guided Botox injection, initial injection was on January 02, 2018,  EMG guided Botox injection Used 100 units of Botox A    Right longissimus capitis 12.5 units Right splenius cervix 12.5 Right semispinalis 12.5 units Right levator scapular 12.5 units  Left longissimus capitis 25 units Left splenius cervix 12.5 units Left semispinalis 12.5 units Left levator scapular 12.5 units     Marcial Pacas, M.D. Ph.D.  [facility] [address], [place], [place] [address] Ph: [phone] Fax: [phone]

## 2019-12-09 NOTE — Progress Notes (Signed)
**  Botox 100 units x 1 vial, NDC 3014-1597-33, Lot [id number], Exp 04/2022, office supply.//[email or web address]**

## 2019-12-12 DIAGNOSIS — G243 Spasmodic torticollis: Secondary | ICD-10-CM

## 2019-12-12 MED ORDER — ONABOTULINUMTOXINA 100 UNITS IJ SOLR
100.0000 [IU] | Freq: Once | INTRAMUSCULAR | Status: AC
  Administered 2019-12-12: 100 [IU] via INTRAMUSCULAR

## 2019-12-14 ENCOUNTER — Ambulatory Visit: Payer: Medicare Other | Admitting: Neurology

## 2019-12-15 ENCOUNTER — Other Ambulatory Visit: Payer: Self-pay | Admitting: *Deleted

## 2019-12-15 NOTE — Patient Outreach (Signed)
[facility] Coordinator follow up. Member screened for potential [facility] Care Management needs as a benefit of [place] [employer].  Update received from [facility] social worker indicating Mrs. Isaza will return to [facility] on tomorrow 12/16/19.   No identifiable [facility] Care Management needs at this time.    Marthenia Rolling, MSN-Ed, RN,BSN [place] [facility] Care Coordinator [phone] [facility]) [phone]  (Toll free office)

## 2019-12-16 ENCOUNTER — Encounter: Payer: Self-pay | Admitting: Family

## 2019-12-16 ENCOUNTER — Non-Acute Institutional Stay (SKILLED_NURSING_FACILITY): Payer: Medicare Other | Admitting: Family

## 2019-12-16 DIAGNOSIS — E43 Unspecified severe protein-calorie malnutrition: Secondary | ICD-10-CM

## 2019-12-16 DIAGNOSIS — E538 Deficiency of other specified B group vitamins: Secondary | ICD-10-CM | POA: Diagnosis not present

## 2019-12-16 DIAGNOSIS — S32591D Other specified fracture of right pubis, subsequent encounter for fracture with routine healing: Secondary | ICD-10-CM

## 2019-12-16 DIAGNOSIS — G8929 Other chronic pain: Secondary | ICD-10-CM

## 2019-12-16 DIAGNOSIS — E1142 Type 2 diabetes mellitus with diabetic polyneuropathy: Secondary | ICD-10-CM

## 2019-12-16 DIAGNOSIS — M542 Cervicalgia: Secondary | ICD-10-CM

## 2019-12-16 DIAGNOSIS — N3941 Urge incontinence: Secondary | ICD-10-CM | POA: Diagnosis not present

## 2019-12-16 DIAGNOSIS — K5901 Slow transit constipation: Secondary | ICD-10-CM

## 2019-12-16 DIAGNOSIS — S32512D Fracture of superior rim of left pubis, subsequent encounter for fracture with routine healing: Secondary | ICD-10-CM | POA: Diagnosis not present

## 2019-12-16 DIAGNOSIS — F321 Major depressive disorder, single episode, moderate: Secondary | ICD-10-CM | POA: Diagnosis not present

## 2019-12-16 DIAGNOSIS — R2681 Unsteadiness on feet: Secondary | ICD-10-CM | POA: Diagnosis not present

## 2019-12-16 DIAGNOSIS — M810 Age-related osteoporosis without current pathological fracture: Secondary | ICD-10-CM

## 2019-12-16 DIAGNOSIS — J449 Chronic obstructive pulmonary disease, unspecified: Secondary | ICD-10-CM | POA: Diagnosis not present

## 2019-12-16 DIAGNOSIS — J302 Other seasonal allergic rhinitis: Secondary | ICD-10-CM

## 2019-12-16 DIAGNOSIS — K219 Gastro-esophageal reflux disease without esophagitis: Secondary | ICD-10-CM

## 2019-12-16 MED ORDER — CETIRIZINE HCL 10 MG PO TABS: 10.0000 mg | ORAL_TABLET | Freq: Every day | ORAL | 0 refills | Status: DC | PRN

## 2019-12-16 MED ORDER — MYRBETRIQ 25 MG PO TB24: 25.0000 mg | ORAL_TABLET | Freq: Every day | ORAL | 0 refills | Status: AC

## 2019-12-16 MED ORDER — IBANDRONATE SODIUM 150 MG PO TABS: 150.0000 mg | ORAL_TABLET | ORAL | 0 refills | Status: AC

## 2019-12-16 MED ORDER — ALBUTEROL SULFATE (2.5 MG/3ML) 0.083% IN NEBU: 2.5000 mg | INHALATION_SOLUTION | Freq: Four times a day (QID) | RESPIRATORY_TRACT | 0 refills | Status: AC | PRN

## 2019-12-16 MED ORDER — INSULIN GLARGINE 100 UNIT/ML ~~LOC~~ SOLN: 12.0000 [IU] | Freq: Every day | SUBCUTANEOUS | 0 refills | Status: DC

## 2019-12-16 MED ORDER — SERTRALINE HCL 50 MG PO TABS: 50.0000 mg | ORAL_TABLET | Freq: Two times a day (BID) | ORAL | 0 refills | Status: DC

## 2019-12-16 MED ORDER — FLUTICASONE-SALMETEROL 250-50 MCG/DOSE IN AEPB
1.0000 | INHALATION_SPRAY | Freq: Two times a day (BID) | RESPIRATORY_TRACT | 0 refills | Status: DC
Start: 2019-12-16 — End: 2020-06-02

## 2019-12-16 MED ORDER — ACETAMINOPHEN 325 MG PO TABS: 650.0000 mg | ORAL_TABLET | Freq: Four times a day (QID) | ORAL | 0 refills | Status: DC | PRN

## 2019-12-16 MED ORDER — COMBIVENT RESPIMAT 20-100 MCG/ACT IN AERS: 1.0000 | INHALATION_SPRAY | Freq: Three times a day (TID) | RESPIRATORY_TRACT | 0 refills | Status: AC | PRN

## 2019-12-16 MED ORDER — SENNOSIDES-DOCUSATE SODIUM 8.6-50 MG PO TABS: 1.0000 | ORAL_TABLET | Freq: Every day | ORAL | 0 refills | Status: DC

## 2019-12-16 MED ORDER — GABAPENTIN 100 MG PO CAPS: 100.0000 mg | ORAL_CAPSULE | Freq: Two times a day (BID) | ORAL | 0 refills | Status: DC

## 2019-12-16 MED ORDER — FLUTICASONE PROPIONATE 50 MCG/ACT NA SUSP: 2.0000 | Freq: Every day | NASAL | 0 refills | Status: AC | PRN

## 2019-12-16 MED ORDER — TRIMETHOPRIM 100 MG PO TABS
100.0000 mg | ORAL_TABLET | Freq: Every day | ORAL | 0 refills | Status: DC
Start: 2019-12-16 — End: 2020-06-05

## 2019-12-16 MED ORDER — LIDOCAINE-PRILOCAINE 2.5-2.5 % EX CREA
1.0000 | TOPICAL_CREAM | Freq: Four times a day (QID) | CUTANEOUS | 0 refills | Status: DC | PRN
Start: 2019-12-16 — End: 2021-10-30

## 2019-12-16 MED ORDER — METFORMIN HCL 1000 MG PO TABS
1000.0000 mg | ORAL_TABLET | Freq: Two times a day (BID) | ORAL | 0 refills | Status: AC
Start: 2019-12-16 — End: ?

## 2019-12-16 MED ORDER — VITAMIN B-12 1000 MCG PO TABS: 1000.0000 ug | ORAL_TABLET | Freq: Every day | ORAL | 0 refills | Status: AC

## 2019-12-16 MED ORDER — SIMBRINZA 1-0.2 % OP SUSP: 1.0000 [drp] | Freq: Every day | OPHTHALMIC | 0 refills | Status: AC

## 2019-12-16 MED ORDER — GABAPENTIN 100 MG PO CAPS: 100.0000 mg | ORAL_CAPSULE | Freq: Every day | ORAL | 0 refills | Status: AC

## 2019-12-16 NOTE — Progress Notes (Signed)
Location:  [facility] Room Number: 111-P Place of Service:  SNF [phone])  Provider: Marlowe Sax FNP-C   PCP: [name], MD Patient Care Team: Tisovec, Fransico Him, MD as PCP - General (Internal Medicine) Mayo, Darla Lesches, PA-C (Physician Assistant)  Extended Emergency Contact Information Primary Emergency Contact: [name],[name] Address: [address]           [place], [place] [address] Johnnette Litter of [place] Phone: [phone] Mobile Phone: [phone] Relation: Daughter Secondary Emergency Contact: Mitchell,Bunny Address: [address]          [place], [place] [address] Johnnette Litter of [place] Phone: [phone] Mobile Phone: [phone] Relation: Daughter  Code Status: DNR Goals of care:  Advanced Directive information Advanced Directives 12/16/2019  Does Patient Have a Medical Advance Directive? Yes  Type of Advance Directive Out of facility DNR (pink MOST or yellow form)  Does patient want to make changes to medical advance directive? No - Patient declined  Copy of [place] in Chart? No - copy requested  Would patient like information on creating a medical advance directive? -     No Known Allergies  Chief Complaint  Patient presents with   Discharge Note    Discharge from SNF to [facility] at 11am.    HPI:  86 y.o. female seen today at [facility] for discharge to [facility] [place].she has a medical history of Type 2 DM, COPD,Cervical dystonia,anorexia,prior complete heart block with cardiac arrest,seasonal allergies,senile Osteoporosis,Pace maker placement,severe protein calorie malnutrition  Among other conditions.she was living independently at [facility] 2 months ago prior to hospitalization but was transferred to [facility] due to frequent falls.She was here for short term rehabilitation for post hospital admission from  11/21/2019 - 11/25/2019 for a fall with right hip pain.she was walking ambulating with a walker when she sustained a fall striking her head and right hip.she was unable to walk and had immediate pain.X-ray done showed comminuted fractures of right superior and inferior pubic rami.Also had sacral insufficiency fractures.PT was consulted recommended SNF.   She  has worked well with PT/OT now stable for discharge to [facility] .She will be discharged with Home health PT/OT to continue with ROM, Exercise, Gait stability and muscle strengthening. She will  Require DME Rolling Walker to allow her to maintain current level of independence with ADL's. Home health services will be arranged by facility social worker prior to discharge. Prescription medication will be written x 1 month then patient to follow up with PCP in 1-2 weeks.she denies any acute issues this visit. Facility staff report no new concerns. Her CBG are in the 80's - 150's    Past Medical History:  Diagnosis Date   Asthma    Cardiac pacemaker in situ    Complete heart block (HCC)    COPD (chronic obstructive pulmonary disease) (HCC)    Diverticulosis of colon    GERD (gastroesophageal reflux disease)    Glaucoma of both eyes    Heart disease    History of cardiac arrest    09-11-2012--  symptomatic complete heart block -- hr 20's--  cpr, intubated and temp. pacer until  perm. pacemaker placement   History of esophageal dilatation    for stricture 2007   History of hiatal hernia    History of iron deficiency anemia    hx transfusion's 15 yrs ago , approx 2001   Hyperlipidemia  Hypertension    Neck pain    Peripheral neuropathy    Tachy-brady syndrome (HCC)    Type 2 diabetes mellitus (HCC)    Urgency incontinence    Botox injections for bladder.   Wears glasses     Past Surgical History:  Procedure Laterality Date   ABDOMINAL HYSTERECTOMY  1976  approx   w/ unilateral  salpingoophorectomy   CATARACT EXTRACTION W/ INTRAOCULAR LENS  IMPLANT, BILATERAL  1990   COLONOSCOPY WITH ESOPHAGOGASTRODUODENOSCOPY (EGD)  last one 2007   INTERSTIM IMPLANT PLACEMENT  2012   INTERSTIM IMPLANT REMOVAL N/A 09/21/2014   Procedure: REMOVAL OF INTERSTIM IMPLANT;  Surgeon: Bjorn Loser, MD;  Location: [facility];  Service: Urology;  Laterality: N/A;   INTERSTIM IMPLANT REVISION N/A 09/21/2014   Procedure: Barrie Lyme STAGE ONE AND TWO;  Surgeon: Bjorn Loser, MD;  Location: [facility];  Service: Urology;  Laterality: N/A;   LAPAROTOMY W/ UNILATERAL SALPINGOOPHORECTOMY  1960's  approx   PERMANENT PACEMAKER INSERTION N/A 09/11/2012   Procedure: PERMANENT PACEMAKER INSERTION;  Surgeon: Evans Lance, MD;  Location: [facility] CATH LAB;  Service: Cardiovascular;  Laterality: N/A;  [place]  approx      reports that she has never smoked. She has never used smokeless tobacco. She reports that she does not drink alcohol and does not use drugs. Social History   Socioeconomic History   Marital status: Married    Spouse name: Not on file   Number of children: 3   Years of education: 12   Highest education level: High school graduate  Occupational History   Occupation: Retired  Tobacco Use   Smoking status: Never Smoker   Smokeless tobacco: Never Used  Scientific laboratory technician Use: Never used  Substance and Sexual Activity   Alcohol use: No   Drug use: No   Sexual activity: Not Currently    Birth control/protection: None  Other Topics Concern   Not on file  Social History Narrative   Lives with her husband in an independent living facility.   Right-handed.   1-2 cup caffeine daily.   Social Determinants of Health   Financial Resource Strain:    Difficulty of Paying Living Expenses: Not on file  Food Insecurity:    Worried About Charity fundraiser in the Last Year: Not on file   [employer] of Food in the Last Year: Not on file  Transportation Needs:    Lack of Transportation (Medical): Not on file   Lack of Transportation (Non-Medical): Not on file  Physical Activity:    Days of Exercise per Week: Not on file   Minutes of Exercise per Session: Not on file  Stress:    Feeling of Stress : Not on file  Social Connections:    Frequency of Communication with Friends and Family: Not on file   Frequency of Social Gatherings with Friends and Family: Not on file   Attends Religious Services: Not on file   Active Member of Clubs or Organizations: Not on file   Attends Archivist Meetings: Not on file   Marital Status: Not on file  Intimate Partner Violence:    Fear of Current or Ex-Partner: Not on file   Emotionally Abused: Not on file   Physically Abused: Not on file   Sexually Abused: Not on file   Functional Status Survey:    No Known Allergies  Pertinent  Health  Maintenance Due  Topic Date Due   FOOT EXAM  Never done   OPHTHALMOLOGY EXAM  Never done   URINE MICROALBUMIN  Never done   DEXA SCAN  Never done   PNA vac Low Risk Adult (1 of 2 - PCV13) Never done   INFLUENZA VACCINE  10/11/2019   HEMOGLOBIN A1C  10/16/2019    Medications: Outpatient Encounter Medications as of 12/16/2019  Medication Sig   acetaminophen (TYLENOL) 325 MG tablet Take 2 tablets (650 mg total) by mouth every 6 (six) hours as needed.   albuterol (PROVENTIL) (2.5 MG/3ML) 0.083% nebulizer solution Take 3 mLs (2.5 mg total) by nebulization every 6 (six) hours as needed for wheezing or shortness of breath.   aspirin EC 81 MG tablet Take 81 mg by mouth daily.   bisacodyl (DULCOLAX) 10 MG suppository Place 10 mg rectally as needed for moderate constipation.   Brinzolamide-Brimonidine (SIMBRINZA) 1-0.2 % SUSP Apply 1 drop to eye at bedtime.   cetirizine (ZYRTEC) 10 MG tablet Take 1 tablet (10 mg total) by mouth daily as needed for allergies.    Esomeprazole Magnesium (NEXIUM PO) Take 1 tablet by mouth daily.   fluticasone (FLONASE) 50 MCG/ACT nasal spray Place 2 sprays into both nostrils daily as needed for allergies. SHAKE AS DIRECTED   Fluticasone-Salmeterol (ADVAIR DISKUS) 250-50 MCG/DOSE AEPB Inhale 1 puff into the lungs 2 (two) times daily. Inhale 1 puff into  lungs twice daily gargle and rinse your mouth with water after each use of this medication.   gabapentin (NEURONTIN) 100 MG capsule Take 1 capsule (100 mg total) by mouth at bedtime.   gabapentin (NEURONTIN) 100 MG capsule Take 1 capsule (100 mg total) by mouth in the morning and at bedtime. Give one by mouth in the morning and midday.   ibandronate (BONIVA) 150 MG tablet Take 1 tablet (150 mg total) by mouth every 30 (thirty) days. Take in the morning with a full glass of water, on an empty stomach, and do not take anything else by mouth or lie down for the next 30 min.   insulin glargine (LANTUS) 100 UNIT/ML injection Inject 0.12 mLs (12 Units total) into the skin at bedtime. 9 pm daily   Ipratropium-Albuterol (COMBIVENT RESPIMAT) 20-100 MCG/ACT AERS respimat Inhale 1 puff into the lungs every 8 (eight) hours as needed for wheezing.   lidocaine-prilocaine (EMLA) cream Apply 1 application topically 4 (four) times daily as needed.   Magnesium Hydroxide (MILK OF MAGNESIA PO) Take 30 mLs by mouth as needed.   Menthol, Topical Analgesic, (BIOFREEZE) 4 % GEL Apply 1 application topically at bedtime. Apply to Right Leg.   metFORMIN (GLUCOPHAGE) 1000 MG tablet Take 1 tablet (1,000 mg total) by mouth 2 (two) times daily with a meal.   MYRBETRIQ 25 MG TB24 tablet Take 1 tablet (25 mg total) by mouth daily.   polyethylene glycol (MIRALAX / GLYCOLAX) 17 g packet Take 17 g by mouth daily.   senna-docusate (SENOKOT-S) 8.6-50 MG tablet Take 1 tablet by mouth daily.   sertraline (ZOLOFT) 50 MG tablet Take 1 tablet (50 mg total) by mouth 2 (two) times daily.   Sodium Phosphates  (RA SALINE ENEMA RE) Place rectally as needed.   trimethoprim (TRIMPEX) 100 MG tablet Take 1 tablet (100 mg total) by mouth daily. OK TO RESUME AFTER YOU FINISH CURRENT ANTIBIOTIC THERAPY.   vitamin B-12 (CYANOCOBALAMIN) 1000 MCG tablet Take 1 tablet (1,000 mcg total) by mouth daily.   [DISCONTINUED] acetaminophen (TYLENOL) 325 MG tablet Take  650 mg by mouth every 6 (six) hours as needed.   [DISCONTINUED] albuterol (PROVENTIL) (2.5 MG/3ML) 0.083% nebulizer solution Take 2.5 mg by nebulization every 6 (six) hours as needed for wheezing or shortness of breath.   [DISCONTINUED] Brinzolamide-Brimonidine (SIMBRINZA) 1-0.2 % SUSP Apply 1 drop to eye at bedtime.   [DISCONTINUED] cetirizine (ZYRTEC) 10 MG tablet Take 10 mg by mouth daily as needed for allergies.    [DISCONTINUED] fluticasone (FLONASE) 50 MCG/ACT nasal spray Place 2 sprays into both nostrils daily as needed for allergies. SHAKE AS DIRECTED    [DISCONTINUED] Fluticasone-Salmeterol (ADVAIR DISKUS) 250-50 MCG/DOSE AEPB Inhale 1 puff into the lungs 2 (two) times daily. Inhale 1 puff into  lungs twice daily gargle and rinse your mouth with water after each use of this medication.   [DISCONTINUED] gabapentin (NEURONTIN) 100 MG capsule Take 100 mg by mouth at bedtime.   [DISCONTINUED] gabapentin (NEURONTIN) 100 MG capsule Take 100 mg by mouth in the morning and at bedtime. Give one by mouth in the morning and midday.   [DISCONTINUED] ibandronate (BONIVA) 150 MG tablet Take 150 mg by mouth every 30 (thirty) days. Take in the morning with a full glass of water, on an empty stomach, and do not take anything else by mouth or lie down for the next 30 min.   [DISCONTINUED] insulin glargine (LANTUS) 100 UNIT/ML injection Inject 12 Units into the skin at bedtime. 9 pm daily   [DISCONTINUED] Ipratropium-Albuterol (COMBIVENT RESPIMAT) 20-100 MCG/ACT AERS respimat Inhale 1 puff into the lungs every 8 (eight) hours as needed for wheezing.    [DISCONTINUED] lidocaine-prilocaine (EMLA) cream Apply 1 application topically 4 (four) times daily as needed.   [DISCONTINUED] metFORMIN (GLUCOPHAGE) 1000 MG tablet Take 1,000 mg by mouth 2 (two) times daily with a meal.   [DISCONTINUED] MYRBETRIQ 25 MG TB24 tablet Take 25 mg by mouth daily.    [DISCONTINUED] senna-docusate (SENOKOT-S) 8.6-50 MG tablet Take 1 tablet by mouth daily.   [DISCONTINUED] sertraline (ZOLOFT) 50 MG tablet Take 1 tablet (50 mg total) by mouth 2 (two) times daily.   [DISCONTINUED] trimethoprim (TRIMPEX) 100 MG tablet Take 1 tablet (100 mg total) by mouth daily. OK TO RESUME AFTER YOU FINISH CURRENT ANTIBIOTIC THERAPY.   [DISCONTINUED] vitamin B-12 (CYANOCOBALAMIN) 1000 MCG tablet Take 1,000 mcg by mouth daily.   [DISCONTINUED] gabapentin (NEURONTIN) 100 MG capsule Take 2 capsules in am, 2 capsules midday, 1 capsule at bedtime.   [DISCONTINUED] HYDROcodone-acetaminophen (NORCO/VICODIN) 5-325 MG tablet Take 1-2 tablets by mouth every 4 (four) hours as needed for moderate pain.   [DISCONTINUED] traMADol (ULTRAM) 50 MG tablet Take 1 tablet (50 mg total) by mouth every 8 (eight) hours.   No facility-administered encounter medications on file as of 12/16/2019.     Review of Systems  Constitutional: Negative for appetite change, chills, fatigue and fever.  HENT: Negative for congestion, rhinorrhea, sinus pressure, sinus pain, sneezing, sore throat and trouble swallowing.   Eyes: Negative for discharge, redness and itching.  Respiratory: Negative for cough, chest tightness, shortness of breath and wheezing.   Cardiovascular: Negative for chest pain, palpitations and leg swelling.  Gastrointestinal: Negative for abdominal distention, abdominal pain, constipation, diarrhea, nausea and vomiting.  Endocrine: Negative for cold intolerance, heat intolerance, polydipsia, polyphagia and polyuria.  Genitourinary: Negative for difficulty urinating, dysuria, flank pain, frequency  and urgency.  Musculoskeletal: Positive for gait problem. Negative for joint swelling and myalgias.  Skin: Negative for color change, pallor and rash.  Neurological: Positive for weakness. Negative for  dizziness, speech difficulty, light-headedness, numbness and headaches.  Hematological: Does not bruise/bleed easily.  Psychiatric/Behavioral: Negative for agitation, behavioral problems and sleep disturbance. The patient is not nervous/anxious.     Vitals:   12/16/19 1018  BP: 116/60  Pulse: 80  Resp: 18  Temp: 97.7 F (36.5 C)  Weight: 122 lb (55.3 kg)  Height: 5' (1.524 m)   Body mass index is 23.83 kg/m.  Physical Exam Vitals and nursing note reviewed.  Constitutional:      General: She is not in acute distress.    Appearance: She is normal weight. She is not ill-appearing.  HENT:     Head: Normocephalic.     Nose: Nose normal. No congestion or rhinorrhea.     Mouth/Throat:     Mouth: Mucous membranes are moist.     Pharynx: Oropharynx is clear. No oropharyngeal exudate or posterior oropharyngeal erythema.  Eyes:     General: No scleral icterus.       Right eye: No discharge.        Left eye: No discharge.     Conjunctiva/sclera: Conjunctivae normal.     Pupils: Pupils are equal, round, and reactive to light.  Cardiovascular:     Rate and Rhythm: Normal rate and regular rhythm.     Pulses: Normal pulses.     Heart sounds: Murmur heard.  No friction rub. No gallop.   Pulmonary:     Effort: Pulmonary effort is normal. No respiratory distress.     Breath sounds: Normal breath sounds. No wheezing, rhonchi or rales.  Chest:     Chest wall: No tenderness.  Abdominal:     General: Bowel sounds are normal. There is no distension.     Palpations: Abdomen is soft. There is no mass.     Tenderness: There is no abdominal tenderness. There is no left CVA tenderness, guarding or rebound.  Musculoskeletal:        General: No swelling or tenderness.     Cervical back: Normal  range of motion. No rigidity or tenderness.     Right lower leg: No edema.     Left lower leg: No edema.     Comments: Unsteady gait   Lymphadenopathy:     Cervical: No cervical adenopathy.  Skin:    General: Skin is warm and dry.     Coloration: Skin is not pale.     Findings: No bruising, erythema or rash.  Neurological:     Mental Status: She is alert and oriented to person, place, and time.     Cranial Nerves: No cranial nerve deficit.     Sensory: No sensory deficit.     Motor: No weakness.     Coordination: Coordination normal.     Gait: Gait normal.  Psychiatric:        Mood and Affect: Mood normal.        Behavior: Behavior normal.        Thought Content: Thought content normal.        Judgment: Judgment normal.    Labs reviewed: Basic Metabolic Panel: Recent Labs    04/22/19 0316 04/22/19 0316 11/22/19 0226 11/22/19 0226 11/22/19 1900 11/23/19 0523 12/02/19 0000  NA 139   < > 138  --   --  137 138  K 4.3   < > 4.3  --   --  4.5 4.6  CL 102   < > 101  --   --  101 102  CO2 27   < >  29  --   --  26 27*  GLUCOSE 237*  --  175*  --   --  107*  --   BUN 32*   < > 26*  --   --  26* 18  CREATININE 0.71   < > 0.68   < > 0.75 0.60 0.6  CALCIUM 8.9   < > 9.0  --   --  8.6* 9.1   < > = values in this interval not displayed.   Liver Function Tests: Recent Labs    04/21/19 0417 04/21/19 0417 04/22/19 0316 11/23/19 0523 12/02/19 0000  AST 9*   < > 13* 14* 11*  ALT 12   < > 12 15 8   ALKPHOS 66  --  68 47  --   BILITOT 0.5  --  0.6 0.4  --   PROT 5.7*  --  5.7* 5.4*  --   ALBUMIN 2.5*   < > 2.4* 3.1* 3.1*   < > = values in this interval not displayed.   Recent Labs    04/17/19 1321  LIPASE 18   CBC: Recent Labs    04/17/19 1321 04/18/19 0630 11/22/19 0226 11/22/19 0226 11/22/19 1900 11/23/19 0523 12/02/19 0000  WBC 9.9   < > 8.9   < > 9.6 10.2 7.7  NEUTROABS 7.3  --  6.4  --   --   --   --   HGB 12.0   < > 10.1*   < > 9.7* 10.1* 9.3*  HCT 36.6    < > 31.9*   < > 31.3* 31.6* 29*  MCV 87.6   < > 89.1  --  91.5 89.8  --   PLT 373   < > 222   < > 205 202 456*   < > = values in this interval not displayed.   CBG: Recent Labs    11/23/19 2104 11/24/19 0732 11/25/19 0753  GLUCAP 288* 224* 266*    Procedures and Imaging Studies During Stay: CT Head Wo Contrast  Result Date: 11/22/2019 CLINICAL DATA:  Head and neck pain after a fall EXAM: CT HEAD WITHOUT CONTRAST CT CERVICAL SPINE WITHOUT CONTRAST TECHNIQUE: Multidetector CT imaging of the head and cervical spine was performed following the standard protocol without intravenous contrast. Multiplanar CT image reconstructions of the cervical spine were also generated. COMPARISON:  CT cervical spine 12/10/2017 FINDINGS: CT HEAD FINDINGS Brain: Diffuse cerebral atrophy. Ventricular dilatation consistent with central atrophy. Low-attenuation changes in the deep white matter consistent with small vessel ischemia. No abnormal extra-axial fluid collections. No mass effect or midline shift. Gray-white matter junctions are distinct. Basal cisterns are not effaced. No acute intracranial hemorrhage. Vascular: Moderate intracranial arterial vascular calcifications. Skull: Calvarium appears intact. Sinuses/Orbits: Paranasal sinuses and mastoid air cells are clear. Other: None. CT CERVICAL SPINE FINDINGS Alignment: Slight anterior subluxations demonstrated at C3-4 and C4-5 levels, unchanged since prior study. This is likely degenerative. Normal alignment of the posterior elements. C1-2 articulation appears intact. Skull base and vertebrae: Skull base appears intact. No vertebral compression deformities. No focal bone lesion or bone destruction. Ligamentous calcifications associated with C1-2. Soft tissues and spinal canal: No prevertebral soft tissue swelling. No abnormal paraspinal soft tissue mass or infiltration. Vascular calcifications. Disc levels: Degenerative changes throughout the cervical spine with  narrowed interspaces and endplate hypertrophic change. Degenerative changes are most prominent at C4-5, C5-6, and C6-7 levels. Degenerative changes in the posterior facet joints. Facet joint hypertrophy and uncovertebral spurring causes  encroachment of the neural foramina at multiple levels bilaterally. Upper chest: Lung apices are clear. Other: None. IMPRESSION: 1. No acute intracranial abnormalities. Chronic atrophy and small vessel ischemic changes. 2. Slight anterior subluxations at C3-4 and C4-5 levels are unchanged since prior study and likely degenerative. Degenerative changes throughout the cervical spine. No acute displaced fractures identified. Electronically Signed   By: Lucienne Capers M.D.   On: 11/22/2019 00:38   CT Cervical Spine Wo Contrast  Result Date: 11/22/2019 CLINICAL DATA:  Head and neck pain after a fall EXAM: CT HEAD WITHOUT CONTRAST CT CERVICAL SPINE WITHOUT CONTRAST TECHNIQUE: Multidetector CT imaging of the head and cervical spine was performed following the standard protocol without intravenous contrast. Multiplanar CT image reconstructions of the cervical spine were also generated. COMPARISON:  CT cervical spine 12/10/2017 FINDINGS: CT HEAD FINDINGS Brain: Diffuse cerebral atrophy. Ventricular dilatation consistent with central atrophy. Low-attenuation changes in the deep white matter consistent with small vessel ischemia. No abnormal extra-axial fluid collections. No mass effect or midline shift. Gray-white matter junctions are distinct. Basal cisterns are not effaced. No acute intracranial hemorrhage. Vascular: Moderate intracranial arterial vascular calcifications. Skull: Calvarium appears intact. Sinuses/Orbits: Paranasal sinuses and mastoid air cells are clear. Other: None. CT CERVICAL SPINE FINDINGS Alignment: Slight anterior subluxations demonstrated at C3-4 and C4-5 levels, unchanged since prior study. This is likely degenerative. Normal alignment of the posterior elements.  C1-2 articulation appears intact. Skull base and vertebrae: Skull base appears intact. No vertebral compression deformities. No focal bone lesion or bone destruction. Ligamentous calcifications associated with C1-2. Soft tissues and spinal canal: No prevertebral soft tissue swelling. No abnormal paraspinal soft tissue mass or infiltration. Vascular calcifications. Disc levels: Degenerative changes throughout the cervical spine with narrowed interspaces and endplate hypertrophic change. Degenerative changes are most prominent at C4-5, C5-6, and C6-7 levels. Degenerative changes in the posterior facet joints. Facet joint hypertrophy and uncovertebral spurring causes encroachment of the neural foramina at multiple levels bilaterally. Upper chest: Lung apices are clear. Other: None. IMPRESSION: 1. No acute intracranial abnormalities. Chronic atrophy and small vessel ischemic changes. 2. Slight anterior subluxations at C3-4 and C4-5 levels are unchanged since prior study and likely degenerative. Degenerative changes throughout the cervical spine. No acute displaced fractures identified. Electronically Signed   By: Lucienne Capers M.D.   On: 11/22/2019 00:38   DG Hip Unilat W or Wo Pelvis 2-3 Views Right  Result Date: 11/22/2019 CLINICAL DATA:  Right hip pain after a fall EXAM: DG HIP (WITH OR WITHOUT PELVIS) 2-3V RIGHT COMPARISON:  None. FINDINGS: Acute appearing comminuted fractures of the right superior and inferior pubic rami with mild displacement. The right hip appears otherwise intact. No evidence of acute fracture or dislocation in the hip joint. Visualized sacrum appears intact. SI joints and symphysis pubis are not displaced. Vascular calcifications. Sacral stimulator device. IMPRESSION: Acute appearing comminuted fractures of the right superior and inferior pubic rami. Electronically Signed   By: Lucienne Capers M.D.   On: 11/22/2019 00:23    Assessment/Plan:    1. Unsteady gait Has worked well with  PT/ OT. She will discharge home PT/OT to continue with ROM, Exercise, Gait stability and muscle strengthening. She will  Require  DME Rolling walker to allow her to maintain current level of independence with ADL's. Fall and safety precautions.  2. Closed fracture of superior ramus of left pubis with routine healing, subsequent encounter Pain well controlled.continue current pain regimen.  - acetaminophen (TYLENOL) 325 MG tablet; Take 2 tablets (  650 mg total) by mouth every 6 (six) hours as needed.  Dispense: 60 tablet; Refill: 0 - gabapentin (NEURONTIN) 100 MG capsule; Take 1 capsule (100 mg total) by mouth at bedtime.  Dispense: 30 capsule; Refill: 0 - gabapentin (NEURONTIN) 100 MG capsule; Take 1 capsule (100 mg total) by mouth in the morning and at bedtime. Give one by mouth in the morning and midday.  Dispense: 60 capsule; Refill: 0 - CBC, BMP in 1-2 weeks PCP   3. Closed fracture of right inferior pubic ramus with routine healing, subsequent encounter Pain under controlled on tylenol and Gabapentin. - acetaminophen (TYLENOL) 325 MG tablet; Take 2 tablets (650 mg total) by mouth every 6 (six) hours as needed.  Dispense: 60 tablet; Refill: 0 - gabapentin (NEURONTIN) 100 MG capsule; Take 1 capsule (100 mg total) by mouth at bedtime.  Dispense: 30 capsule; Refill: 0 - gabapentin (NEURONTIN) 100 MG capsule; Take 1 capsule (100 mg total) by mouth in the morning and at bedtime. Give one by mouth in the morning and midday.  Dispense: 60 capsule; Refill: 0 - CBC, BMP in 1-2 weeks PCP   4. Type 2 diabetes mellitus with diabetic polyneuropathy, without long-term current use of insulin (HCC) No Hgb A1C for review.will defer to her PCP  CBG's well controlled.  - insulin glargine (LANTUS) 100 UNIT/ML injection; Inject 0.12 mLs (12 Units total) into the skin at bedtime. 9 pm daily  Dispense: 10 mL; Refill: 0 - metFORMIN (GLUCOPHAGE) 1000 MG tablet; Take 1 tablet (1,000 mg total) by mouth 2 (two) times  daily with a meal.  Dispense: 60 tablet; Refill: 0  5. Gastroesophageal reflux disease without esophagitis Asymptomatic. - continue on Esomeprazole daily.  6. Chronic obstructive pulmonary disease, unspecified COPD type ([place]) Breathing is stable.continue current Nebulizer  - albuterol (PROVENTIL) (2.5 MG/3ML) 0.083% nebulizer solution; Take 3 mLs (2.5 mg total) by nebulization every 6 (six) hours as needed for wheezing or shortness of breath.  Dispense: 75 mL; Refill: 0 - Ipratropium-Albuterol (COMBIVENT RESPIMAT) 20-100 MCG/ACT AERS respimat; Inhale 1 puff into the lungs every 8 (eight) hours as needed for wheezing.  Dispense: 4 g; Refill: 0  7. Chronic neck pain Pain controlled on current regimen.  - gabapentin (NEURONTIN) 100 MG capsule; Take 1 capsule (100 mg total) by mouth at bedtime.  Dispense: 30 capsule; Refill: 0 - gabapentin (NEURONTIN) 100 MG capsule; Take 1 capsule (100 mg total) by mouth in the morning and at bedtime. Give one by mouth in the morning and midday.  Dispense: 60 capsule; Refill: 0  8. Senile osteoporosis Continue on ibandronate.  - ibandronate (BONIVA) 150 MG tablet; Take 1 tablet (150 mg total) by mouth every 30 (thirty) days. Take in the morning with a full glass of water, on an empty stomach, and do not take anything else by mouth or lie down for the next 30 min.  Dispense: 1 tablet; Refill: 0  9. Slow transit constipation Continue on Senna-docusate  And biscodyl as needed - senna-docusate (SENOKOT-S) 8.6-50 MG tablet; Take 1 tablet by mouth daily.  Dispense: 30 tablet; Refill: 0  10. Current moderate episode of major depressive disorder, unspecified whether recurrent (HCC) Mood is stable.continue on sertraline.  - sertraline (ZOLOFT) 50 MG tablet; Take 1 tablet (50 mg total) by mouth 2 (two) times daily.  Dispense: 60 tablet; Refill: 0  11. Urgency incontinence Chronic.continue on MyebetriQ  - MYRBETRIQ 25 MG TB24 tablet; Take 1 tablet (25 mg total) by  mouth daily.  Dispense:  30 tablet; Refill: 0  12. Vitamin B12 deficiency Continue on vit B12 supplement. - vitamin B-12 (CYANOCOBALAMIN) 1000 MCG tablet; Take 1 tablet (1,000 mcg total) by mouth daily.  Dispense: 30 tablet; Refill: 0  13. Severe protein-calorie malnutrition ([place]) Advised to continue on protein supplement.monitor weight.   14. Seasonal allergies Seasonal.Zyrtec effective.  - cetirizine (ZYRTEC) 10 MG tablet; Take 1 tablet (10 mg total) by mouth daily as needed for allergies.  Dispense: 30 tablet; Refill: 0 - fluticasone (FLONASE) 50 MCG/ACT nasal spray; Place 2 sprays into both nostrils daily as needed for allergies. SHAKE AS DIRECTED  Dispense: 16 g; Refill: 0  Patient is being discharged with the following home health services:   -PT/OT for ROM, exercise, gait stability and muscle strengthening   Patient is being discharged with the following durable medical equipment:    Rolling walker  to allow her to maintain current level of independence.  Patient has been advised to f/u with their PCP in 1-2 weeks to for a transitions of care visit.Social services at their facility was responsible for arranging this appointment.  Pt was provided with adequate prescriptions of noncontrolled medications to reach the scheduled appointment.For controlled substances, a limited supply was provided as appropriate for the individual patient. If the pt normally receives these medications from a pain clinic or has a contract with another physician, these medications should be received from that clinic or physician only).    Future labs/tests needed:  CBC, BMP in 1-2 weeks PCP

## 2019-12-18 ENCOUNTER — Other Ambulatory Visit: Payer: Self-pay | Admitting: Family

## 2019-12-18 DIAGNOSIS — S32512D Fracture of superior rim of left pubis, subsequent encounter for fracture with routine healing: Secondary | ICD-10-CM

## 2019-12-18 DIAGNOSIS — M542 Cervicalgia: Secondary | ICD-10-CM

## 2019-12-18 DIAGNOSIS — M84653D Pathological fracture in other disease, unspecified femur, subsequent encounter for fracture with routine healing: Secondary | ICD-10-CM | POA: Diagnosis not present

## 2019-12-18 DIAGNOSIS — F321 Major depressive disorder, single episode, moderate: Secondary | ICD-10-CM

## 2019-12-18 DIAGNOSIS — Z9181 History of falling: Secondary | ICD-10-CM | POA: Diagnosis not present

## 2019-12-18 DIAGNOSIS — R279 Unspecified lack of coordination: Secondary | ICD-10-CM | POA: Diagnosis not present

## 2019-12-18 DIAGNOSIS — E1142 Type 2 diabetes mellitus with diabetic polyneuropathy: Secondary | ICD-10-CM

## 2019-12-18 DIAGNOSIS — R269 Unspecified abnormalities of gait and mobility: Secondary | ICD-10-CM | POA: Diagnosis not present

## 2019-12-18 DIAGNOSIS — M6281 Muscle weakness (generalized): Secondary | ICD-10-CM | POA: Diagnosis not present

## 2019-12-18 DIAGNOSIS — N3941 Urge incontinence: Secondary | ICD-10-CM

## 2019-12-18 DIAGNOSIS — J302 Other seasonal allergic rhinitis: Secondary | ICD-10-CM

## 2019-12-18 DIAGNOSIS — G8929 Other chronic pain: Secondary | ICD-10-CM

## 2019-12-18 DIAGNOSIS — Z741 Need for assistance with personal care: Secondary | ICD-10-CM | POA: Diagnosis not present

## 2019-12-18 DIAGNOSIS — S32591D Other specified fracture of right pubis, subsequent encounter for fracture with routine healing: Secondary | ICD-10-CM

## 2019-12-18 DIAGNOSIS — R2681 Unsteadiness on feet: Secondary | ICD-10-CM | POA: Diagnosis not present

## 2019-12-22 ENCOUNTER — Other Ambulatory Visit: Payer: Self-pay | Admitting: Family

## 2019-12-22 DIAGNOSIS — J449 Chronic obstructive pulmonary disease, unspecified: Secondary | ICD-10-CM

## 2019-12-22 DIAGNOSIS — R2681 Unsteadiness on feet: Secondary | ICD-10-CM | POA: Diagnosis not present

## 2019-12-22 DIAGNOSIS — Z9181 History of falling: Secondary | ICD-10-CM | POA: Diagnosis not present

## 2019-12-22 DIAGNOSIS — R279 Unspecified lack of coordination: Secondary | ICD-10-CM | POA: Diagnosis not present

## 2019-12-22 DIAGNOSIS — R269 Unspecified abnormalities of gait and mobility: Secondary | ICD-10-CM | POA: Diagnosis not present

## 2019-12-22 DIAGNOSIS — M6281 Muscle weakness (generalized): Secondary | ICD-10-CM | POA: Diagnosis not present

## 2019-12-22 DIAGNOSIS — Z741 Need for assistance with personal care: Secondary | ICD-10-CM | POA: Diagnosis not present

## 2019-12-23 DIAGNOSIS — M1711 Unilateral primary osteoarthritis, right knee: Secondary | ICD-10-CM | POA: Diagnosis not present

## 2019-12-25 DIAGNOSIS — R269 Unspecified abnormalities of gait and mobility: Secondary | ICD-10-CM | POA: Diagnosis not present

## 2019-12-25 DIAGNOSIS — R279 Unspecified lack of coordination: Secondary | ICD-10-CM | POA: Diagnosis not present

## 2019-12-25 DIAGNOSIS — R2681 Unsteadiness on feet: Secondary | ICD-10-CM | POA: Diagnosis not present

## 2019-12-25 DIAGNOSIS — Z9181 History of falling: Secondary | ICD-10-CM | POA: Diagnosis not present

## 2019-12-25 DIAGNOSIS — Z741 Need for assistance with personal care: Secondary | ICD-10-CM | POA: Diagnosis not present

## 2019-12-25 DIAGNOSIS — M6281 Muscle weakness (generalized): Secondary | ICD-10-CM | POA: Diagnosis not present

## 2019-12-29 DIAGNOSIS — R2681 Unsteadiness on feet: Secondary | ICD-10-CM | POA: Diagnosis not present

## 2019-12-29 DIAGNOSIS — R279 Unspecified lack of coordination: Secondary | ICD-10-CM | POA: Diagnosis not present

## 2019-12-29 DIAGNOSIS — Z9181 History of falling: Secondary | ICD-10-CM | POA: Diagnosis not present

## 2019-12-29 DIAGNOSIS — R269 Unspecified abnormalities of gait and mobility: Secondary | ICD-10-CM | POA: Diagnosis not present

## 2019-12-29 DIAGNOSIS — M6281 Muscle weakness (generalized): Secondary | ICD-10-CM | POA: Diagnosis not present

## 2019-12-29 DIAGNOSIS — Z741 Need for assistance with personal care: Secondary | ICD-10-CM | POA: Diagnosis not present

## 2019-12-31 DIAGNOSIS — Z9181 History of falling: Secondary | ICD-10-CM | POA: Diagnosis not present

## 2019-12-31 DIAGNOSIS — M6281 Muscle weakness (generalized): Secondary | ICD-10-CM | POA: Diagnosis not present

## 2019-12-31 DIAGNOSIS — R279 Unspecified lack of coordination: Secondary | ICD-10-CM | POA: Diagnosis not present

## 2019-12-31 DIAGNOSIS — R2681 Unsteadiness on feet: Secondary | ICD-10-CM | POA: Diagnosis not present

## 2019-12-31 DIAGNOSIS — Z741 Need for assistance with personal care: Secondary | ICD-10-CM | POA: Diagnosis not present

## 2019-12-31 DIAGNOSIS — R269 Unspecified abnormalities of gait and mobility: Secondary | ICD-10-CM | POA: Diagnosis not present

## 2020-01-02 DIAGNOSIS — Z9181 History of falling: Secondary | ICD-10-CM | POA: Diagnosis not present

## 2020-01-02 DIAGNOSIS — R279 Unspecified lack of coordination: Secondary | ICD-10-CM | POA: Diagnosis not present

## 2020-01-02 DIAGNOSIS — M6281 Muscle weakness (generalized): Secondary | ICD-10-CM | POA: Diagnosis not present

## 2020-01-02 DIAGNOSIS — R2681 Unsteadiness on feet: Secondary | ICD-10-CM | POA: Diagnosis not present

## 2020-01-02 DIAGNOSIS — R269 Unspecified abnormalities of gait and mobility: Secondary | ICD-10-CM | POA: Diagnosis not present

## 2020-01-02 DIAGNOSIS — Z741 Need for assistance with personal care: Secondary | ICD-10-CM | POA: Diagnosis not present

## 2020-01-03 DIAGNOSIS — E1122 Type 2 diabetes mellitus with diabetic chronic kidney disease: Secondary | ICD-10-CM | POA: Diagnosis not present

## 2020-01-05 DIAGNOSIS — R2681 Unsteadiness on feet: Secondary | ICD-10-CM | POA: Diagnosis not present

## 2020-01-05 DIAGNOSIS — Z741 Need for assistance with personal care: Secondary | ICD-10-CM | POA: Diagnosis not present

## 2020-01-05 DIAGNOSIS — M6281 Muscle weakness (generalized): Secondary | ICD-10-CM | POA: Diagnosis not present

## 2020-01-05 DIAGNOSIS — R269 Unspecified abnormalities of gait and mobility: Secondary | ICD-10-CM | POA: Diagnosis not present

## 2020-01-05 DIAGNOSIS — R279 Unspecified lack of coordination: Secondary | ICD-10-CM | POA: Diagnosis not present

## 2020-01-05 DIAGNOSIS — Z9181 History of falling: Secondary | ICD-10-CM | POA: Diagnosis not present

## 2020-01-07 DIAGNOSIS — Z9181 History of falling: Secondary | ICD-10-CM | POA: Diagnosis not present

## 2020-01-07 DIAGNOSIS — R2681 Unsteadiness on feet: Secondary | ICD-10-CM | POA: Diagnosis not present

## 2020-01-07 DIAGNOSIS — R269 Unspecified abnormalities of gait and mobility: Secondary | ICD-10-CM | POA: Diagnosis not present

## 2020-01-07 DIAGNOSIS — R279 Unspecified lack of coordination: Secondary | ICD-10-CM | POA: Diagnosis not present

## 2020-01-07 DIAGNOSIS — Z741 Need for assistance with personal care: Secondary | ICD-10-CM | POA: Diagnosis not present

## 2020-01-07 DIAGNOSIS — M6281 Muscle weakness (generalized): Secondary | ICD-10-CM | POA: Diagnosis not present

## 2020-01-07 DIAGNOSIS — Z23 Encounter for immunization: Secondary | ICD-10-CM | POA: Diagnosis not present

## 2020-01-12 DIAGNOSIS — M84653D Pathological fracture in other disease, unspecified femur, subsequent encounter for fracture with routine healing: Secondary | ICD-10-CM | POA: Diagnosis not present

## 2020-01-12 DIAGNOSIS — R2681 Unsteadiness on feet: Secondary | ICD-10-CM | POA: Diagnosis not present

## 2020-01-12 DIAGNOSIS — M6281 Muscle weakness (generalized): Secondary | ICD-10-CM | POA: Diagnosis not present

## 2020-01-12 DIAGNOSIS — R269 Unspecified abnormalities of gait and mobility: Secondary | ICD-10-CM | POA: Diagnosis not present

## 2020-01-12 DIAGNOSIS — Z741 Need for assistance with personal care: Secondary | ICD-10-CM | POA: Diagnosis not present

## 2020-01-12 DIAGNOSIS — Z9181 History of falling: Secondary | ICD-10-CM | POA: Diagnosis not present

## 2020-01-12 DIAGNOSIS — R279 Unspecified lack of coordination: Secondary | ICD-10-CM | POA: Diagnosis not present

## 2020-01-14 DIAGNOSIS — R279 Unspecified lack of coordination: Secondary | ICD-10-CM | POA: Diagnosis not present

## 2020-01-14 DIAGNOSIS — R269 Unspecified abnormalities of gait and mobility: Secondary | ICD-10-CM | POA: Diagnosis not present

## 2020-01-14 DIAGNOSIS — M84653D Pathological fracture in other disease, unspecified femur, subsequent encounter for fracture with routine healing: Secondary | ICD-10-CM | POA: Diagnosis not present

## 2020-01-14 DIAGNOSIS — Z9181 History of falling: Secondary | ICD-10-CM | POA: Diagnosis not present

## 2020-01-14 DIAGNOSIS — R2681 Unsteadiness on feet: Secondary | ICD-10-CM | POA: Diagnosis not present

## 2020-01-14 DIAGNOSIS — M6281 Muscle weakness (generalized): Secondary | ICD-10-CM | POA: Diagnosis not present

## 2020-01-19 DIAGNOSIS — Z9181 History of falling: Secondary | ICD-10-CM | POA: Diagnosis not present

## 2020-01-19 DIAGNOSIS — R2681 Unsteadiness on feet: Secondary | ICD-10-CM | POA: Diagnosis not present

## 2020-01-19 DIAGNOSIS — R279 Unspecified lack of coordination: Secondary | ICD-10-CM | POA: Diagnosis not present

## 2020-01-19 DIAGNOSIS — R269 Unspecified abnormalities of gait and mobility: Secondary | ICD-10-CM | POA: Diagnosis not present

## 2020-01-19 DIAGNOSIS — M84653D Pathological fracture in other disease, unspecified femur, subsequent encounter for fracture with routine healing: Secondary | ICD-10-CM | POA: Diagnosis not present

## 2020-01-19 DIAGNOSIS — M6281 Muscle weakness (generalized): Secondary | ICD-10-CM | POA: Diagnosis not present

## 2020-01-21 DIAGNOSIS — M84653D Pathological fracture in other disease, unspecified femur, subsequent encounter for fracture with routine healing: Secondary | ICD-10-CM | POA: Diagnosis not present

## 2020-01-21 DIAGNOSIS — M6281 Muscle weakness (generalized): Secondary | ICD-10-CM | POA: Diagnosis not present

## 2020-01-21 DIAGNOSIS — R269 Unspecified abnormalities of gait and mobility: Secondary | ICD-10-CM | POA: Diagnosis not present

## 2020-01-21 DIAGNOSIS — R2681 Unsteadiness on feet: Secondary | ICD-10-CM | POA: Diagnosis not present

## 2020-01-21 DIAGNOSIS — Z9181 History of falling: Secondary | ICD-10-CM | POA: Diagnosis not present

## 2020-01-21 DIAGNOSIS — R279 Unspecified lack of coordination: Secondary | ICD-10-CM | POA: Diagnosis not present

## 2020-01-25 DIAGNOSIS — M84653D Pathological fracture in other disease, unspecified femur, subsequent encounter for fracture with routine healing: Secondary | ICD-10-CM | POA: Diagnosis not present

## 2020-01-25 DIAGNOSIS — R279 Unspecified lack of coordination: Secondary | ICD-10-CM | POA: Diagnosis not present

## 2020-01-25 DIAGNOSIS — R269 Unspecified abnormalities of gait and mobility: Secondary | ICD-10-CM | POA: Diagnosis not present

## 2020-01-25 DIAGNOSIS — Z9181 History of falling: Secondary | ICD-10-CM | POA: Diagnosis not present

## 2020-01-25 DIAGNOSIS — R2681 Unsteadiness on feet: Secondary | ICD-10-CM | POA: Diagnosis not present

## 2020-01-25 DIAGNOSIS — M6281 Muscle weakness (generalized): Secondary | ICD-10-CM | POA: Diagnosis not present

## 2020-01-28 DIAGNOSIS — Z23 Encounter for immunization: Secondary | ICD-10-CM | POA: Diagnosis not present

## 2020-01-28 DIAGNOSIS — R269 Unspecified abnormalities of gait and mobility: Secondary | ICD-10-CM | POA: Diagnosis not present

## 2020-01-28 DIAGNOSIS — M84653D Pathological fracture in other disease, unspecified femur, subsequent encounter for fracture with routine healing: Secondary | ICD-10-CM | POA: Diagnosis not present

## 2020-01-28 DIAGNOSIS — Z9181 History of falling: Secondary | ICD-10-CM | POA: Diagnosis not present

## 2020-01-28 DIAGNOSIS — M6281 Muscle weakness (generalized): Secondary | ICD-10-CM | POA: Diagnosis not present

## 2020-01-28 DIAGNOSIS — R2681 Unsteadiness on feet: Secondary | ICD-10-CM | POA: Diagnosis not present

## 2020-01-28 DIAGNOSIS — R279 Unspecified lack of coordination: Secondary | ICD-10-CM | POA: Diagnosis not present

## 2020-01-29 ENCOUNTER — Other Ambulatory Visit: Payer: Self-pay | Admitting: Family

## 2020-01-29 DIAGNOSIS — R269 Unspecified abnormalities of gait and mobility: Secondary | ICD-10-CM | POA: Diagnosis not present

## 2020-01-29 DIAGNOSIS — Z9181 History of falling: Secondary | ICD-10-CM | POA: Diagnosis not present

## 2020-01-29 DIAGNOSIS — M84653D Pathological fracture in other disease, unspecified femur, subsequent encounter for fracture with routine healing: Secondary | ICD-10-CM | POA: Diagnosis not present

## 2020-01-29 DIAGNOSIS — M6281 Muscle weakness (generalized): Secondary | ICD-10-CM | POA: Diagnosis not present

## 2020-01-29 DIAGNOSIS — R2681 Unsteadiness on feet: Secondary | ICD-10-CM | POA: Diagnosis not present

## 2020-01-29 DIAGNOSIS — R279 Unspecified lack of coordination: Secondary | ICD-10-CM | POA: Diagnosis not present

## 2020-01-30 DIAGNOSIS — R269 Unspecified abnormalities of gait and mobility: Secondary | ICD-10-CM | POA: Diagnosis not present

## 2020-01-30 DIAGNOSIS — R2681 Unsteadiness on feet: Secondary | ICD-10-CM | POA: Diagnosis not present

## 2020-01-30 DIAGNOSIS — Z9181 History of falling: Secondary | ICD-10-CM | POA: Diagnosis not present

## 2020-01-30 DIAGNOSIS — R279 Unspecified lack of coordination: Secondary | ICD-10-CM | POA: Diagnosis not present

## 2020-01-30 DIAGNOSIS — M6281 Muscle weakness (generalized): Secondary | ICD-10-CM | POA: Diagnosis not present

## 2020-01-30 DIAGNOSIS — M84653D Pathological fracture in other disease, unspecified femur, subsequent encounter for fracture with routine healing: Secondary | ICD-10-CM | POA: Diagnosis not present

## 2020-02-01 DIAGNOSIS — Z9181 History of falling: Secondary | ICD-10-CM | POA: Diagnosis not present

## 2020-02-01 DIAGNOSIS — M6281 Muscle weakness (generalized): Secondary | ICD-10-CM | POA: Diagnosis not present

## 2020-02-01 DIAGNOSIS — M84653D Pathological fracture in other disease, unspecified femur, subsequent encounter for fracture with routine healing: Secondary | ICD-10-CM | POA: Diagnosis not present

## 2020-02-01 DIAGNOSIS — R269 Unspecified abnormalities of gait and mobility: Secondary | ICD-10-CM | POA: Diagnosis not present

## 2020-02-01 DIAGNOSIS — R279 Unspecified lack of coordination: Secondary | ICD-10-CM | POA: Diagnosis not present

## 2020-02-01 DIAGNOSIS — R2681 Unsteadiness on feet: Secondary | ICD-10-CM | POA: Diagnosis not present

## 2020-02-03 DIAGNOSIS — M84653D Pathological fracture in other disease, unspecified femur, subsequent encounter for fracture with routine healing: Secondary | ICD-10-CM | POA: Diagnosis not present

## 2020-02-03 DIAGNOSIS — R279 Unspecified lack of coordination: Secondary | ICD-10-CM | POA: Diagnosis not present

## 2020-02-03 DIAGNOSIS — R2681 Unsteadiness on feet: Secondary | ICD-10-CM | POA: Diagnosis not present

## 2020-02-03 DIAGNOSIS — Z9181 History of falling: Secondary | ICD-10-CM | POA: Diagnosis not present

## 2020-02-03 DIAGNOSIS — M6281 Muscle weakness (generalized): Secondary | ICD-10-CM | POA: Diagnosis not present

## 2020-02-03 DIAGNOSIS — R269 Unspecified abnormalities of gait and mobility: Secondary | ICD-10-CM | POA: Diagnosis not present

## 2020-02-05 DIAGNOSIS — R2681 Unsteadiness on feet: Secondary | ICD-10-CM | POA: Diagnosis not present

## 2020-02-05 DIAGNOSIS — R279 Unspecified lack of coordination: Secondary | ICD-10-CM | POA: Diagnosis not present

## 2020-02-05 DIAGNOSIS — M6281 Muscle weakness (generalized): Secondary | ICD-10-CM | POA: Diagnosis not present

## 2020-02-05 DIAGNOSIS — R269 Unspecified abnormalities of gait and mobility: Secondary | ICD-10-CM | POA: Diagnosis not present

## 2020-02-05 DIAGNOSIS — M84653D Pathological fracture in other disease, unspecified femur, subsequent encounter for fracture with routine healing: Secondary | ICD-10-CM | POA: Diagnosis not present

## 2020-02-05 DIAGNOSIS — Z9181 History of falling: Secondary | ICD-10-CM | POA: Diagnosis not present

## 2020-02-06 DIAGNOSIS — R279 Unspecified lack of coordination: Secondary | ICD-10-CM | POA: Diagnosis not present

## 2020-02-06 DIAGNOSIS — R269 Unspecified abnormalities of gait and mobility: Secondary | ICD-10-CM | POA: Diagnosis not present

## 2020-02-06 DIAGNOSIS — M6281 Muscle weakness (generalized): Secondary | ICD-10-CM | POA: Diagnosis not present

## 2020-02-06 DIAGNOSIS — M84653D Pathological fracture in other disease, unspecified femur, subsequent encounter for fracture with routine healing: Secondary | ICD-10-CM | POA: Diagnosis not present

## 2020-02-06 DIAGNOSIS — Z9181 History of falling: Secondary | ICD-10-CM | POA: Diagnosis not present

## 2020-02-06 DIAGNOSIS — R2681 Unsteadiness on feet: Secondary | ICD-10-CM | POA: Diagnosis not present

## 2020-02-08 DIAGNOSIS — M6281 Muscle weakness (generalized): Secondary | ICD-10-CM | POA: Diagnosis not present

## 2020-02-08 DIAGNOSIS — R2681 Unsteadiness on feet: Secondary | ICD-10-CM | POA: Diagnosis not present

## 2020-02-08 DIAGNOSIS — R269 Unspecified abnormalities of gait and mobility: Secondary | ICD-10-CM | POA: Diagnosis not present

## 2020-02-08 DIAGNOSIS — Z9181 History of falling: Secondary | ICD-10-CM | POA: Diagnosis not present

## 2020-02-08 DIAGNOSIS — R279 Unspecified lack of coordination: Secondary | ICD-10-CM | POA: Diagnosis not present

## 2020-02-08 DIAGNOSIS — M84653D Pathological fracture in other disease, unspecified femur, subsequent encounter for fracture with routine healing: Secondary | ICD-10-CM | POA: Diagnosis not present

## 2020-02-09 DIAGNOSIS — R269 Unspecified abnormalities of gait and mobility: Secondary | ICD-10-CM | POA: Diagnosis not present

## 2020-02-09 DIAGNOSIS — R2681 Unsteadiness on feet: Secondary | ICD-10-CM | POA: Diagnosis not present

## 2020-02-09 DIAGNOSIS — R279 Unspecified lack of coordination: Secondary | ICD-10-CM | POA: Diagnosis not present

## 2020-02-09 DIAGNOSIS — M6281 Muscle weakness (generalized): Secondary | ICD-10-CM | POA: Diagnosis not present

## 2020-02-09 DIAGNOSIS — M84653D Pathological fracture in other disease, unspecified femur, subsequent encounter for fracture with routine healing: Secondary | ICD-10-CM | POA: Diagnosis not present

## 2020-02-09 DIAGNOSIS — Z9181 History of falling: Secondary | ICD-10-CM | POA: Diagnosis not present

## 2020-02-10 LAB — CUP PACEART REMOTE DEVICE CHECK
Battery Remaining Longevity: 48 mo
Battery Remaining Percentage: 39 %
Battery Voltage: 2.84 V
Brady Statistic AP VP Percent: 1 %
Brady Statistic AP VS Percent: 1 %
Brady Statistic AS VP Percent: 99 %
Brady Statistic AS VS Percent: 1 %
Brady Statistic RA Percent Paced: 1 %
Brady Statistic RV Percent Paced: 99 %
Date Time Interrogation Session: 20211129020016
Implantable Lead Implant Date: 20140703
Implantable Lead Implant Date: 20140703
Implantable Lead Location: 753859
Implantable Lead Location: 753860
Implantable Pulse Generator Implant Date: 20140703
Lead Channel Impedance Value: 450 Ohm
Lead Channel Impedance Value: 590 Ohm
Lead Channel Pacing Threshold Amplitude: 0.5 V
Lead Channel Pacing Threshold Amplitude: 1.125 V
Lead Channel Pacing Threshold Pulse Width: 0.4 ms
Lead Channel Pacing Threshold Pulse Width: 0.4 ms
Lead Channel Sensing Intrinsic Amplitude: 1.9 mV
Lead Channel Sensing Intrinsic Amplitude: 12 mV
Lead Channel Setting Pacing Amplitude: 1.375
Lead Channel Setting Pacing Amplitude: 2 V
Lead Channel Setting Pacing Pulse Width: 0.4 ms
Lead Channel Setting Sensing Sensitivity: 12.5 mV
Pulse Gen Model: 2240
Pulse Gen Serial Number: 7522008

## 2020-02-12 ENCOUNTER — Ambulatory Visit (INDEPENDENT_AMBULATORY_CARE_PROVIDER_SITE_OTHER): Payer: Medicare Other

## 2020-02-12 DIAGNOSIS — Z9181 History of falling: Secondary | ICD-10-CM | POA: Diagnosis not present

## 2020-02-12 DIAGNOSIS — M84653D Pathological fracture in other disease, unspecified femur, subsequent encounter for fracture with routine healing: Secondary | ICD-10-CM | POA: Diagnosis not present

## 2020-02-12 DIAGNOSIS — R269 Unspecified abnormalities of gait and mobility: Secondary | ICD-10-CM | POA: Diagnosis not present

## 2020-02-12 DIAGNOSIS — M6281 Muscle weakness (generalized): Secondary | ICD-10-CM | POA: Diagnosis not present

## 2020-02-12 DIAGNOSIS — R2681 Unsteadiness on feet: Secondary | ICD-10-CM | POA: Diagnosis not present

## 2020-02-12 DIAGNOSIS — R279 Unspecified lack of coordination: Secondary | ICD-10-CM | POA: Diagnosis not present

## 2020-02-12 DIAGNOSIS — I442 Atrioventricular block, complete: Secondary | ICD-10-CM

## 2020-02-12 DIAGNOSIS — Z741 Need for assistance with personal care: Secondary | ICD-10-CM | POA: Diagnosis not present

## 2020-02-15 DIAGNOSIS — Z9181 History of falling: Secondary | ICD-10-CM | POA: Diagnosis not present

## 2020-02-15 DIAGNOSIS — R269 Unspecified abnormalities of gait and mobility: Secondary | ICD-10-CM | POA: Diagnosis not present

## 2020-02-15 DIAGNOSIS — M6281 Muscle weakness (generalized): Secondary | ICD-10-CM | POA: Diagnosis not present

## 2020-02-15 DIAGNOSIS — R2681 Unsteadiness on feet: Secondary | ICD-10-CM | POA: Diagnosis not present

## 2020-02-15 DIAGNOSIS — R279 Unspecified lack of coordination: Secondary | ICD-10-CM | POA: Diagnosis not present

## 2020-02-15 DIAGNOSIS — Z741 Need for assistance with personal care: Secondary | ICD-10-CM | POA: Diagnosis not present

## 2020-02-17 DIAGNOSIS — R2681 Unsteadiness on feet: Secondary | ICD-10-CM | POA: Diagnosis not present

## 2020-02-17 DIAGNOSIS — Z741 Need for assistance with personal care: Secondary | ICD-10-CM | POA: Diagnosis not present

## 2020-02-17 DIAGNOSIS — Z9181 History of falling: Secondary | ICD-10-CM | POA: Diagnosis not present

## 2020-02-17 DIAGNOSIS — R279 Unspecified lack of coordination: Secondary | ICD-10-CM | POA: Diagnosis not present

## 2020-02-17 DIAGNOSIS — R269 Unspecified abnormalities of gait and mobility: Secondary | ICD-10-CM | POA: Diagnosis not present

## 2020-02-17 DIAGNOSIS — M6281 Muscle weakness (generalized): Secondary | ICD-10-CM | POA: Diagnosis not present

## 2020-02-19 DIAGNOSIS — M6281 Muscle weakness (generalized): Secondary | ICD-10-CM | POA: Diagnosis not present

## 2020-02-19 DIAGNOSIS — R269 Unspecified abnormalities of gait and mobility: Secondary | ICD-10-CM | POA: Diagnosis not present

## 2020-02-19 DIAGNOSIS — Z741 Need for assistance with personal care: Secondary | ICD-10-CM | POA: Diagnosis not present

## 2020-02-19 DIAGNOSIS — R279 Unspecified lack of coordination: Secondary | ICD-10-CM | POA: Diagnosis not present

## 2020-02-19 DIAGNOSIS — R2681 Unsteadiness on feet: Secondary | ICD-10-CM | POA: Diagnosis not present

## 2020-02-19 DIAGNOSIS — Z9181 History of falling: Secondary | ICD-10-CM | POA: Diagnosis not present

## 2020-02-24 DIAGNOSIS — R269 Unspecified abnormalities of gait and mobility: Secondary | ICD-10-CM | POA: Diagnosis not present

## 2020-02-24 DIAGNOSIS — Z9181 History of falling: Secondary | ICD-10-CM | POA: Diagnosis not present

## 2020-02-24 DIAGNOSIS — Z741 Need for assistance with personal care: Secondary | ICD-10-CM | POA: Diagnosis not present

## 2020-02-24 DIAGNOSIS — R2681 Unsteadiness on feet: Secondary | ICD-10-CM | POA: Diagnosis not present

## 2020-02-24 DIAGNOSIS — M6281 Muscle weakness (generalized): Secondary | ICD-10-CM | POA: Diagnosis not present

## 2020-02-24 DIAGNOSIS — R279 Unspecified lack of coordination: Secondary | ICD-10-CM | POA: Diagnosis not present

## 2020-02-24 NOTE — Progress Notes (Signed)
Remote pacemaker transmission.   

## 2020-02-27 DIAGNOSIS — R269 Unspecified abnormalities of gait and mobility: Secondary | ICD-10-CM | POA: Diagnosis not present

## 2020-02-27 DIAGNOSIS — R279 Unspecified lack of coordination: Secondary | ICD-10-CM | POA: Diagnosis not present

## 2020-02-27 DIAGNOSIS — Z9181 History of falling: Secondary | ICD-10-CM | POA: Diagnosis not present

## 2020-02-27 DIAGNOSIS — Z741 Need for assistance with personal care: Secondary | ICD-10-CM | POA: Diagnosis not present

## 2020-02-27 DIAGNOSIS — M6281 Muscle weakness (generalized): Secondary | ICD-10-CM | POA: Diagnosis not present

## 2020-02-27 DIAGNOSIS — R2681 Unsteadiness on feet: Secondary | ICD-10-CM | POA: Diagnosis not present

## 2020-02-28 DIAGNOSIS — R269 Unspecified abnormalities of gait and mobility: Secondary | ICD-10-CM | POA: Diagnosis not present

## 2020-02-28 DIAGNOSIS — R2681 Unsteadiness on feet: Secondary | ICD-10-CM | POA: Diagnosis not present

## 2020-02-28 DIAGNOSIS — R279 Unspecified lack of coordination: Secondary | ICD-10-CM | POA: Diagnosis not present

## 2020-02-28 DIAGNOSIS — Z9181 History of falling: Secondary | ICD-10-CM | POA: Diagnosis not present

## 2020-02-28 DIAGNOSIS — Z741 Need for assistance with personal care: Secondary | ICD-10-CM | POA: Diagnosis not present

## 2020-02-28 DIAGNOSIS — M6281 Muscle weakness (generalized): Secondary | ICD-10-CM | POA: Diagnosis not present

## 2020-03-06 DIAGNOSIS — R279 Unspecified lack of coordination: Secondary | ICD-10-CM | POA: Diagnosis not present

## 2020-03-06 DIAGNOSIS — M6281 Muscle weakness (generalized): Secondary | ICD-10-CM | POA: Diagnosis not present

## 2020-03-06 DIAGNOSIS — R2681 Unsteadiness on feet: Secondary | ICD-10-CM | POA: Diagnosis not present

## 2020-03-06 DIAGNOSIS — R269 Unspecified abnormalities of gait and mobility: Secondary | ICD-10-CM | POA: Diagnosis not present

## 2020-03-06 DIAGNOSIS — Z741 Need for assistance with personal care: Secondary | ICD-10-CM | POA: Diagnosis not present

## 2020-03-06 DIAGNOSIS — Z9181 History of falling: Secondary | ICD-10-CM | POA: Diagnosis not present

## 2020-03-07 DIAGNOSIS — Z9181 History of falling: Secondary | ICD-10-CM | POA: Diagnosis not present

## 2020-03-07 DIAGNOSIS — M6281 Muscle weakness (generalized): Secondary | ICD-10-CM | POA: Diagnosis not present

## 2020-03-07 DIAGNOSIS — R2681 Unsteadiness on feet: Secondary | ICD-10-CM | POA: Diagnosis not present

## 2020-03-07 DIAGNOSIS — R269 Unspecified abnormalities of gait and mobility: Secondary | ICD-10-CM | POA: Diagnosis not present

## 2020-03-07 DIAGNOSIS — R279 Unspecified lack of coordination: Secondary | ICD-10-CM | POA: Diagnosis not present

## 2020-03-07 DIAGNOSIS — Z741 Need for assistance with personal care: Secondary | ICD-10-CM | POA: Diagnosis not present

## 2020-03-09 DIAGNOSIS — Z9181 History of falling: Secondary | ICD-10-CM | POA: Diagnosis not present

## 2020-03-09 DIAGNOSIS — Z741 Need for assistance with personal care: Secondary | ICD-10-CM | POA: Diagnosis not present

## 2020-03-09 DIAGNOSIS — R269 Unspecified abnormalities of gait and mobility: Secondary | ICD-10-CM | POA: Diagnosis not present

## 2020-03-09 DIAGNOSIS — M1711 Unilateral primary osteoarthritis, right knee: Secondary | ICD-10-CM | POA: Diagnosis not present

## 2020-03-09 DIAGNOSIS — M25561 Pain in right knee: Secondary | ICD-10-CM | POA: Diagnosis not present

## 2020-03-09 DIAGNOSIS — R279 Unspecified lack of coordination: Secondary | ICD-10-CM | POA: Diagnosis not present

## 2020-03-09 DIAGNOSIS — R2681 Unsteadiness on feet: Secondary | ICD-10-CM | POA: Diagnosis not present

## 2020-03-09 DIAGNOSIS — M6281 Muscle weakness (generalized): Secondary | ICD-10-CM | POA: Diagnosis not present

## 2020-03-10 DIAGNOSIS — Z741 Need for assistance with personal care: Secondary | ICD-10-CM | POA: Diagnosis not present

## 2020-03-10 DIAGNOSIS — M6281 Muscle weakness (generalized): Secondary | ICD-10-CM | POA: Diagnosis not present

## 2020-03-10 DIAGNOSIS — R279 Unspecified lack of coordination: Secondary | ICD-10-CM | POA: Diagnosis not present

## 2020-03-10 DIAGNOSIS — Z9181 History of falling: Secondary | ICD-10-CM | POA: Diagnosis not present

## 2020-03-10 DIAGNOSIS — R269 Unspecified abnormalities of gait and mobility: Secondary | ICD-10-CM | POA: Diagnosis not present

## 2020-03-10 DIAGNOSIS — R2681 Unsteadiness on feet: Secondary | ICD-10-CM | POA: Diagnosis not present

## 2020-03-13 DIAGNOSIS — Z9181 History of falling: Secondary | ICD-10-CM | POA: Diagnosis not present

## 2020-03-13 DIAGNOSIS — M84653D Pathological fracture in other disease, unspecified femur, subsequent encounter for fracture with routine healing: Secondary | ICD-10-CM | POA: Diagnosis not present

## 2020-03-13 DIAGNOSIS — M6281 Muscle weakness (generalized): Secondary | ICD-10-CM | POA: Diagnosis not present

## 2020-03-13 DIAGNOSIS — R269 Unspecified abnormalities of gait and mobility: Secondary | ICD-10-CM | POA: Diagnosis not present

## 2020-03-13 DIAGNOSIS — R2681 Unsteadiness on feet: Secondary | ICD-10-CM | POA: Diagnosis not present

## 2020-03-13 DIAGNOSIS — R279 Unspecified lack of coordination: Secondary | ICD-10-CM | POA: Diagnosis not present

## 2020-03-13 DIAGNOSIS — Z741 Need for assistance with personal care: Secondary | ICD-10-CM | POA: Diagnosis not present

## 2020-03-15 DIAGNOSIS — R2681 Unsteadiness on feet: Secondary | ICD-10-CM | POA: Diagnosis not present

## 2020-03-15 DIAGNOSIS — M6281 Muscle weakness (generalized): Secondary | ICD-10-CM | POA: Diagnosis not present

## 2020-03-15 DIAGNOSIS — M84653D Pathological fracture in other disease, unspecified femur, subsequent encounter for fracture with routine healing: Secondary | ICD-10-CM | POA: Diagnosis not present

## 2020-03-15 DIAGNOSIS — R279 Unspecified lack of coordination: Secondary | ICD-10-CM | POA: Diagnosis not present

## 2020-03-15 DIAGNOSIS — Z9181 History of falling: Secondary | ICD-10-CM | POA: Diagnosis not present

## 2020-03-15 DIAGNOSIS — R269 Unspecified abnormalities of gait and mobility: Secondary | ICD-10-CM | POA: Diagnosis not present

## 2020-03-16 DIAGNOSIS — Z9181 History of falling: Secondary | ICD-10-CM | POA: Diagnosis not present

## 2020-03-16 DIAGNOSIS — M6281 Muscle weakness (generalized): Secondary | ICD-10-CM | POA: Diagnosis not present

## 2020-03-16 DIAGNOSIS — R2681 Unsteadiness on feet: Secondary | ICD-10-CM | POA: Diagnosis not present

## 2020-03-16 DIAGNOSIS — M84653D Pathological fracture in other disease, unspecified femur, subsequent encounter for fracture with routine healing: Secondary | ICD-10-CM | POA: Diagnosis not present

## 2020-03-16 DIAGNOSIS — R269 Unspecified abnormalities of gait and mobility: Secondary | ICD-10-CM | POA: Diagnosis not present

## 2020-03-16 DIAGNOSIS — R279 Unspecified lack of coordination: Secondary | ICD-10-CM | POA: Diagnosis not present

## 2020-03-17 DIAGNOSIS — Z9181 History of falling: Secondary | ICD-10-CM | POA: Diagnosis not present

## 2020-03-17 DIAGNOSIS — R279 Unspecified lack of coordination: Secondary | ICD-10-CM | POA: Diagnosis not present

## 2020-03-17 DIAGNOSIS — M84653D Pathological fracture in other disease, unspecified femur, subsequent encounter for fracture with routine healing: Secondary | ICD-10-CM | POA: Diagnosis not present

## 2020-03-17 DIAGNOSIS — M6281 Muscle weakness (generalized): Secondary | ICD-10-CM | POA: Diagnosis not present

## 2020-03-17 DIAGNOSIS — R2681 Unsteadiness on feet: Secondary | ICD-10-CM | POA: Diagnosis not present

## 2020-03-17 DIAGNOSIS — R269 Unspecified abnormalities of gait and mobility: Secondary | ICD-10-CM | POA: Diagnosis not present

## 2020-03-20 DIAGNOSIS — M6281 Muscle weakness (generalized): Secondary | ICD-10-CM | POA: Diagnosis not present

## 2020-03-20 DIAGNOSIS — R2681 Unsteadiness on feet: Secondary | ICD-10-CM | POA: Diagnosis not present

## 2020-03-20 DIAGNOSIS — R269 Unspecified abnormalities of gait and mobility: Secondary | ICD-10-CM | POA: Diagnosis not present

## 2020-03-20 DIAGNOSIS — M84653D Pathological fracture in other disease, unspecified femur, subsequent encounter for fracture with routine healing: Secondary | ICD-10-CM | POA: Diagnosis not present

## 2020-03-20 DIAGNOSIS — Z9181 History of falling: Secondary | ICD-10-CM | POA: Diagnosis not present

## 2020-03-20 DIAGNOSIS — R279 Unspecified lack of coordination: Secondary | ICD-10-CM | POA: Diagnosis not present

## 2020-03-22 DIAGNOSIS — R279 Unspecified lack of coordination: Secondary | ICD-10-CM | POA: Diagnosis not present

## 2020-03-22 DIAGNOSIS — M84653D Pathological fracture in other disease, unspecified femur, subsequent encounter for fracture with routine healing: Secondary | ICD-10-CM | POA: Diagnosis not present

## 2020-03-22 DIAGNOSIS — Z9181 History of falling: Secondary | ICD-10-CM | POA: Diagnosis not present

## 2020-03-22 DIAGNOSIS — R2681 Unsteadiness on feet: Secondary | ICD-10-CM | POA: Diagnosis not present

## 2020-03-22 DIAGNOSIS — R269 Unspecified abnormalities of gait and mobility: Secondary | ICD-10-CM | POA: Diagnosis not present

## 2020-03-22 DIAGNOSIS — M6281 Muscle weakness (generalized): Secondary | ICD-10-CM | POA: Diagnosis not present

## 2020-03-23 ENCOUNTER — Ambulatory Visit (INDEPENDENT_AMBULATORY_CARE_PROVIDER_SITE_OTHER): Payer: Medicare Other | Admitting: Neurology

## 2020-03-23 DIAGNOSIS — R2681 Unsteadiness on feet: Secondary | ICD-10-CM | POA: Diagnosis not present

## 2020-03-23 DIAGNOSIS — R279 Unspecified lack of coordination: Secondary | ICD-10-CM | POA: Diagnosis not present

## 2020-03-23 DIAGNOSIS — G243 Spasmodic torticollis: Secondary | ICD-10-CM

## 2020-03-23 DIAGNOSIS — M542 Cervicalgia: Secondary | ICD-10-CM

## 2020-03-23 DIAGNOSIS — R269 Unspecified abnormalities of gait and mobility: Secondary | ICD-10-CM | POA: Diagnosis not present

## 2020-03-23 DIAGNOSIS — Z9181 History of falling: Secondary | ICD-10-CM | POA: Diagnosis not present

## 2020-03-23 DIAGNOSIS — M6281 Muscle weakness (generalized): Secondary | ICD-10-CM | POA: Diagnosis not present

## 2020-03-23 DIAGNOSIS — M84653D Pathological fracture in other disease, unspecified femur, subsequent encounter for fracture with routine healing: Secondary | ICD-10-CM | POA: Diagnosis not present

## 2020-03-23 MED ORDER — ONABOTULINUMTOXINA 100 UNITS IJ SOLR
100.0000 [IU] | Freq: Once | INTRAMUSCULAR | Status: AC
  Administered 2020-03-23: 100 [IU] via INTRAMUSCULAR

## 2020-03-23 NOTE — Progress Notes (Signed)
Botox 100 units x 1 vial(mixed with 2cc NS) Lot: [id number] Exp: 04/2022 NDC: [phone] Office supply

## 2020-03-23 NOTE — Progress Notes (Signed)
PATIENT: Wendy Velasquez DOB: 1925-03-16  Chief Complaint  Patient presents with  . Cervical dystonia    Patient here for her botox injections.      HISTORICAL  Wendy Velasquez is a 87 years old female, seen in request by her primary care physician Dr. Osborne Casco, [place]ans[place] Him for evaluation of neck pain, headaches, initial evaluation was on December 05, 2017.  She is accompanied by her daughter at today's clinical visit.  I have reviewed and summarized the referring note from the referring physician, she has past medical history of diabetes, use insulin, history of cardiac block, status post pacemaker, hyperlipidemia,  She had a history of chronic neck pain, had acute worsening since March 2018, she woke up one morning, noticed upper neck throbbing pain, radiating pain to her skull, has been persistent since then, over the last 1 year, she was seen by orthopedic PA, Ronette Deter, was treated with multiple trigger point injection, with mixture of 1% lidocaine, and Depo-Medrol, most recent injection was on October 10, 2017, on a monthly basis, patient denies significant improvement, also complains of worsening glucose control after injection,  She denies significant radiating pain to bilateral shoulder upper extremity, denies difficulty using arms, she had chronic urinary incontinence is receiving Botox injection by urologist, she contributed to her gait abnormality to her bilateral knee pain, chronic arthritis,  She now complains of difficulty turning her neck, 6 out of 10 constant deep achy pain, multiple tender spots at cervical, upper trapezius, oftentimes she has transient sharp radiating range of motion, difficulty turning her neck,  She also tried physical therapy, is doing morning neck traction regularly,  UPDATE Jan 02 2018: She continue complains of significant neck pain, 8 out of 10 sometimes, radiating pain to bilateral occipital region despite gabapentin 100 mg 5 tablets each  day, I have personally reviewed CT cervical spine on December 10, 2017, no acute abnormality, multilevel spondylolisthesis, maximum at C4-5, with associated   facet arthropathy, evidence of ankylosis, mild spinal stenosis at the C2-3, C3-4 level  UPDATE Apr 08 2018: She responded very well to her initial injection January 02, 2018, we used 100 units of Botox, a month after injection she was pain-free for 2 months, there was no significant side effect noticed, in the past 2 weeks, she noticed returning of her neck pain, centered at bilateral mastoid process, nuchal line, tenderness upon deep palpitation.  UPDATE August 12 2018: She responded very well to previous injection  UPDATE Sept 9 2020: She responded well to previous injection, we used Botox a 100 units, is take about 3 days for the benefit to kicking, lasting for 2 to 3 months, no significant side effect.  UPDATE Feb 25 2019: She responded well to previous injection  UPDATE June 01 2019: She suffered COVID-29 January 2020, required hospitalization but no intubation, she complains of increased bilateral upper back pain, neck pain.  Update September 09 2019 She complains of worsening neck pain, she was admitted to hospital in early February for acute hypoxic respiratory failure due to COVID-19 pneumonia, she was treated with remdesivir, did not require intubation, however, since the infection, she complains of declining of her status, could no longer walking, rely on wheelchair, fatigue,  UPDATE Set 29 2021: She is with her daughter at today's clinical visit, suffered pelvic fracture, Botox low dose 100 units continue to help her neck muscle pain, spasm  UPDATE Mar 23 2020: She is accompanied by her daughter at today's clinical visit,  overall doing better, previous Botox injection 100 units has really helped her neck pain, is receiving physical therapy  REVIEW OF SYSTEMS: Full 14 system review of systems performed and notable only for as  above. ALLERGIES: No Known Allergies  HOME MEDICATIONS: Current Outpatient Medications  Medication Sig Dispense Refill  . acetaminophen (TYLENOL) 325 MG tablet Take 2 tablets (650 mg total) by mouth every 6 (six) hours as needed. 60 tablet 0  . albuterol (PROVENTIL) (2.5 MG/3ML) 0.083% nebulizer solution Take 3 mLs (2.5 mg total) by nebulization every 6 (six) hours as needed for wheezing or shortness of breath. 75 mL 0  . aspirin EC 81 MG tablet Take 81 mg by mouth daily.    . bisacodyl (DULCOLAX) 10 MG suppository Place 10 mg rectally as needed for moderate constipation.    . Brinzolamide-Brimonidine (SIMBRINZA) 1-0.2 % SUSP Apply 1 drop to eye at bedtime. 8 mL 0  . cetirizine (ZYRTEC) 10 MG tablet Take 1 tablet (10 mg total) by mouth daily as needed for allergies. 30 tablet 0  . Esomeprazole Magnesium (NEXIUM PO) Take 1 tablet by mouth daily.    . fluticasone (FLONASE) 50 MCG/ACT nasal spray Place 2 sprays into both nostrils daily as needed for allergies. SHAKE AS DIRECTED 16 g 0  . Fluticasone-Salmeterol (ADVAIR DISKUS) 250-50 MCG/DOSE AEPB Inhale 1 puff into the lungs 2 (two) times daily. Inhale 1 puff into  lungs twice daily gargle and rinse your mouth with water after each use of this medication. 60 each 0  . gabapentin (NEURONTIN) 100 MG capsule Take 1 capsule (100 mg total) by mouth at bedtime. 30 capsule 0  . gabapentin (NEURONTIN) 100 MG capsule Take 1 capsule (100 mg total) by mouth in the morning and at bedtime. Give one by mouth in the morning and midday. 60 capsule 0  . ibandronate (BONIVA) 150 MG tablet Take 1 tablet (150 mg total) by mouth every 30 (thirty) days. Take in the morning with a full glass of water, on an empty stomach, and do not take anything else by mouth or lie down for the next 30 min. 1 tablet 0  . insulin glargine (LANTUS) 100 UNIT/ML injection Inject 0.12 mLs (12 Units total) into the skin at bedtime. 9 pm daily 10 mL 0  . Ipratropium-Albuterol (COMBIVENT  RESPIMAT) 20-100 MCG/ACT AERS respimat Inhale 1 puff into the lungs every 8 (eight) hours as needed for wheezing. 4 g 0  . lidocaine-prilocaine (EMLA) cream Apply 1 application topically 4 (four) times daily as needed. 30 g 0  . Magnesium Hydroxide (MILK OF MAGNESIA PO) Take 30 mLs by mouth as needed.    . Menthol, Topical Analgesic, (BIOFREEZE) 4 % GEL Apply 1 application topically at bedtime. Apply to Right Leg.    . metFORMIN (GLUCOPHAGE) 1000 MG tablet Take 1 tablet (1,000 mg total) by mouth 2 (two) times daily with a meal. 60 tablet 0  . MYRBETRIQ 25 MG TB24 tablet Take 1 tablet (25 mg total) by mouth daily. 30 tablet 0  . polyethylene glycol (MIRALAX / GLYCOLAX) 17 g packet Take 17 g by mouth daily. 14 each 0  . senna-docusate (SENOKOT-S) 8.6-50 MG tablet Take 1 tablet by mouth daily. 30 tablet 0  . sertraline (ZOLOFT) 50 MG tablet Take 1 tablet (50 mg total) by mouth 2 (two) times daily. 60 tablet 0  . Sodium Phosphates (RA SALINE ENEMA RE) Place rectally as needed.    . trimethoprim (TRIMPEX) 100 MG tablet Take 1 tablet (100  mg total) by mouth daily. OK TO RESUME AFTER YOU FINISH CURRENT ANTIBIOTIC THERAPY. 30 tablet 0  . vitamin B-12 (CYANOCOBALAMIN) 1000 MCG tablet Take 1 tablet (1,000 mcg total) by mouth daily. 30 tablet 0   No current facility-administered medications for this visit.    PAST MEDICAL HISTORY: Past Medical History:  Diagnosis Date  . Asthma   . Cardiac pacemaker in situ   . Complete heart block ([place])   . COPD (chronic obstructive pulmonary disease) ([place])   . Diverticulosis of colon   . GERD (gastroesophageal reflux disease)   . Glaucoma of both eyes   . Heart disease   . History of cardiac arrest    09-11-2012--  symptomatic complete heart block -- hr 20's--  cpr, intubated and temp. pacer until  perm. pacemaker placement  . History of esophageal dilatation    for stricture 2007  . History of hiatal hernia   . History of iron deficiency anemia    hx  transfusion's 15 yrs ago , approx 2001  . Hyperlipidemia   . Hypertension   . Neck pain   . Peripheral neuropathy   . Tachy-brady syndrome ([place])   . Type 2 diabetes mellitus ([place])   . Urgency incontinence    Botox injections for bladder.  . Wears glasses     PAST SURGICAL HISTORY: Past Surgical History:  Procedure Laterality Date  . ABDOMINAL HYSTERECTOMY  1976  approx   w/ unilateral salpingoophorectomy  . CATARACT EXTRACTION W/ INTRAOCULAR LENS  IMPLANT, BILATERAL  1990  . COLONOSCOPY WITH ESOPHAGOGASTRODUODENOSCOPY (EGD)  last one 2007  . INTERSTIM IMPLANT PLACEMENT  2012  . INTERSTIM IMPLANT REMOVAL N/A 09/21/2014   Procedure: REMOVAL OF INTERSTIM IMPLANT;  Surgeon: Bjorn Loser, MD;  Location: [facility];  Service: Urology;  Laterality: N/A;  . INTERSTIM IMPLANT REVISION N/A 09/21/2014   Procedure: [place] STAGE ONE AND TWO;  Surgeon: Bjorn Loser, MD;  Location: [facility];  Service: Urology;  Laterality: N/A;  . LAPAROTOMY W/ UNILATERAL SALPINGOOPHORECTOMY  1960's  approx  . PERMANENT PACEMAKER INSERTION N/A 09/11/2012   Procedure: PERMANENT PACEMAKER INSERTION;  Surgeon: Evans Lance, MD;  Location: [facility] CATH LAB;  Service: Cardiovascular;  Laterality: N/A;  St. Jude Dual Chamber  . RECTOCELE REPAIR  1991  approx    FAMILY HISTORY: Family History  Problem Relation Age of Onset  . Other Mother        died during her childbirth  . Prostatitis Father   . Heart failure Sister   . Breast cancer Sister   . Diabetic kidney disease Sister   . Melanoma Brother     SOCIAL HISTORY: Social History   Socioeconomic History  . Marital status: Married    Spouse name: Not on file  . Number of children: 3  . Years of education: 48  . Highest education level: High school graduate  Occupational History  . Occupation: Retired  Tobacco Use  . Smoking status: Never Smoker  . Smokeless tobacco: Never Used  Vaping Use  . Vaping Use: Never  used  Substance and Sexual Activity  . Alcohol use: No  . Drug use: No  . Sexual activity: Not Currently    Birth control/protection: None  Other Topics Concern  . Not on file  Social History Narrative   Lives with her husband in an independent living facility.   Right-handed.   1-2 cup caffeine daily.   Social Determinants of Health   Financial Resource Strain:  Not on file  Food Insecurity: Not on file  Transportation Needs: Not on file  Physical Activity: Not on file  Stress: Not on file  Social Connections: Not on file  Intimate Partner Violence: Not on file     PHYSICAL EXAM  NEUROLOGICAL EXAM: Multiple posterior neck muscle spasm, especially at bilateral levator scapula, latissimus longus, scapular cervix,  DIAGNOSTIC DATA (LABS, IMAGING, TESTING) - I reviewed patient records, labs, notes, testing and imaging myself where available.   ASSESSMENT AND PLAN  KAMELA BLANSETT is a 87 y.o. female    Chronic neck pain, headaches Abnormal neck posturing, mild retrocollis, left tilt, significant tenderness at bilateral lateral cervical paraspinal muscles, bilateral levator scapular   Worsening gait abnormalities  She has significant hyperreflexia on examination,  CT cervical showed evidence of multilevel spondylolisthesis, maximum at C4-5 with associated facet arthropathy, developing ankylosis  Patient and her family do not want pursue further evaluation at this point   EMG guided Botox injection Used 100 units of Botox A    Right longissimus capitis 12.5 units Right splenius cervix 12.5 Right semispinalis 12.5 units Right levator scapular 12.5 units  Left longissimus capitis 25 units Left splenius cervix 12.5 units Left semispinalis 12.5 units Left levator scapular 12.5 units     Marcial Pacas, M.D. Ph.D.  [facility] [address], [place], [place] [address] Ph: [phone] Fax: [phone]

## 2020-03-24 DIAGNOSIS — R269 Unspecified abnormalities of gait and mobility: Secondary | ICD-10-CM | POA: Diagnosis not present

## 2020-03-24 DIAGNOSIS — Z9181 History of falling: Secondary | ICD-10-CM | POA: Diagnosis not present

## 2020-03-24 DIAGNOSIS — R279 Unspecified lack of coordination: Secondary | ICD-10-CM | POA: Diagnosis not present

## 2020-03-24 DIAGNOSIS — R2681 Unsteadiness on feet: Secondary | ICD-10-CM | POA: Diagnosis not present

## 2020-03-24 DIAGNOSIS — M6281 Muscle weakness (generalized): Secondary | ICD-10-CM | POA: Diagnosis not present

## 2020-03-24 DIAGNOSIS — M84653D Pathological fracture in other disease, unspecified femur, subsequent encounter for fracture with routine healing: Secondary | ICD-10-CM | POA: Diagnosis not present

## 2020-03-25 DIAGNOSIS — R269 Unspecified abnormalities of gait and mobility: Secondary | ICD-10-CM | POA: Diagnosis not present

## 2020-03-25 DIAGNOSIS — R279 Unspecified lack of coordination: Secondary | ICD-10-CM | POA: Diagnosis not present

## 2020-03-25 DIAGNOSIS — M6281 Muscle weakness (generalized): Secondary | ICD-10-CM | POA: Diagnosis not present

## 2020-03-25 DIAGNOSIS — R2681 Unsteadiness on feet: Secondary | ICD-10-CM | POA: Diagnosis not present

## 2020-03-25 DIAGNOSIS — Z9181 History of falling: Secondary | ICD-10-CM | POA: Diagnosis not present

## 2020-03-25 DIAGNOSIS — M84653D Pathological fracture in other disease, unspecified femur, subsequent encounter for fracture with routine healing: Secondary | ICD-10-CM | POA: Diagnosis not present

## 2020-03-26 DIAGNOSIS — R269 Unspecified abnormalities of gait and mobility: Secondary | ICD-10-CM | POA: Diagnosis not present

## 2020-03-26 DIAGNOSIS — R279 Unspecified lack of coordination: Secondary | ICD-10-CM | POA: Diagnosis not present

## 2020-03-26 DIAGNOSIS — M6281 Muscle weakness (generalized): Secondary | ICD-10-CM | POA: Diagnosis not present

## 2020-03-26 DIAGNOSIS — Z9181 History of falling: Secondary | ICD-10-CM | POA: Diagnosis not present

## 2020-03-26 DIAGNOSIS — R2681 Unsteadiness on feet: Secondary | ICD-10-CM | POA: Diagnosis not present

## 2020-03-26 DIAGNOSIS — M84653D Pathological fracture in other disease, unspecified femur, subsequent encounter for fracture with routine healing: Secondary | ICD-10-CM | POA: Diagnosis not present

## 2020-03-29 DIAGNOSIS — R269 Unspecified abnormalities of gait and mobility: Secondary | ICD-10-CM | POA: Diagnosis not present

## 2020-03-29 DIAGNOSIS — R279 Unspecified lack of coordination: Secondary | ICD-10-CM | POA: Diagnosis not present

## 2020-03-29 DIAGNOSIS — M84653D Pathological fracture in other disease, unspecified femur, subsequent encounter for fracture with routine healing: Secondary | ICD-10-CM | POA: Diagnosis not present

## 2020-03-29 DIAGNOSIS — M6281 Muscle weakness (generalized): Secondary | ICD-10-CM | POA: Diagnosis not present

## 2020-03-29 DIAGNOSIS — R2681 Unsteadiness on feet: Secondary | ICD-10-CM | POA: Diagnosis not present

## 2020-03-29 DIAGNOSIS — Z9181 History of falling: Secondary | ICD-10-CM | POA: Diagnosis not present

## 2020-04-03 DIAGNOSIS — Z9181 History of falling: Secondary | ICD-10-CM | POA: Diagnosis not present

## 2020-04-03 DIAGNOSIS — R279 Unspecified lack of coordination: Secondary | ICD-10-CM | POA: Diagnosis not present

## 2020-04-03 DIAGNOSIS — R2681 Unsteadiness on feet: Secondary | ICD-10-CM | POA: Diagnosis not present

## 2020-04-03 DIAGNOSIS — M6281 Muscle weakness (generalized): Secondary | ICD-10-CM | POA: Diagnosis not present

## 2020-04-03 DIAGNOSIS — R269 Unspecified abnormalities of gait and mobility: Secondary | ICD-10-CM | POA: Diagnosis not present

## 2020-04-03 DIAGNOSIS — M84653D Pathological fracture in other disease, unspecified femur, subsequent encounter for fracture with routine healing: Secondary | ICD-10-CM | POA: Diagnosis not present

## 2020-04-04 DIAGNOSIS — Z9181 History of falling: Secondary | ICD-10-CM | POA: Diagnosis not present

## 2020-04-04 DIAGNOSIS — M84653D Pathological fracture in other disease, unspecified femur, subsequent encounter for fracture with routine healing: Secondary | ICD-10-CM | POA: Diagnosis not present

## 2020-04-04 DIAGNOSIS — R2681 Unsteadiness on feet: Secondary | ICD-10-CM | POA: Diagnosis not present

## 2020-04-04 DIAGNOSIS — M6281 Muscle weakness (generalized): Secondary | ICD-10-CM | POA: Diagnosis not present

## 2020-04-04 DIAGNOSIS — R269 Unspecified abnormalities of gait and mobility: Secondary | ICD-10-CM | POA: Diagnosis not present

## 2020-04-04 DIAGNOSIS — R279 Unspecified lack of coordination: Secondary | ICD-10-CM | POA: Diagnosis not present

## 2020-04-05 DIAGNOSIS — I700 Atherosclerosis of aorta: Secondary | ICD-10-CM | POA: Diagnosis not present

## 2020-04-05 DIAGNOSIS — R809 Proteinuria, unspecified: Secondary | ICD-10-CM | POA: Diagnosis not present

## 2020-04-05 DIAGNOSIS — D692 Other nonthrombocytopenic purpura: Secondary | ICD-10-CM | POA: Diagnosis not present

## 2020-04-05 DIAGNOSIS — N1831 Chronic kidney disease, stage 3a: Secondary | ICD-10-CM | POA: Diagnosis not present

## 2020-04-05 DIAGNOSIS — I739 Peripheral vascular disease, unspecified: Secondary | ICD-10-CM | POA: Diagnosis not present

## 2020-04-05 DIAGNOSIS — I129 Hypertensive chronic kidney disease with stage 1 through stage 4 chronic kidney disease, or unspecified chronic kidney disease: Secondary | ICD-10-CM | POA: Diagnosis not present

## 2020-04-05 DIAGNOSIS — M4302 Spondylolysis, cervical region: Secondary | ICD-10-CM | POA: Diagnosis not present

## 2020-04-05 DIAGNOSIS — E7800 Pure hypercholesterolemia, unspecified: Secondary | ICD-10-CM | POA: Diagnosis not present

## 2020-04-05 DIAGNOSIS — E1129 Type 2 diabetes mellitus with other diabetic kidney complication: Secondary | ICD-10-CM | POA: Diagnosis not present

## 2020-04-05 DIAGNOSIS — E559 Vitamin D deficiency, unspecified: Secondary | ICD-10-CM | POA: Diagnosis not present

## 2020-04-05 DIAGNOSIS — J449 Chronic obstructive pulmonary disease, unspecified: Secondary | ICD-10-CM | POA: Diagnosis not present

## 2020-04-05 DIAGNOSIS — M810 Age-related osteoporosis without current pathological fracture: Secondary | ICD-10-CM | POA: Diagnosis not present

## 2020-04-06 DIAGNOSIS — R269 Unspecified abnormalities of gait and mobility: Secondary | ICD-10-CM | POA: Diagnosis not present

## 2020-04-06 DIAGNOSIS — Z9181 History of falling: Secondary | ICD-10-CM | POA: Diagnosis not present

## 2020-04-06 DIAGNOSIS — M84653D Pathological fracture in other disease, unspecified femur, subsequent encounter for fracture with routine healing: Secondary | ICD-10-CM | POA: Diagnosis not present

## 2020-04-06 DIAGNOSIS — R279 Unspecified lack of coordination: Secondary | ICD-10-CM | POA: Diagnosis not present

## 2020-04-06 DIAGNOSIS — M6281 Muscle weakness (generalized): Secondary | ICD-10-CM | POA: Diagnosis not present

## 2020-04-06 DIAGNOSIS — R2681 Unsteadiness on feet: Secondary | ICD-10-CM | POA: Diagnosis not present

## 2020-04-08 DIAGNOSIS — E1129 Type 2 diabetes mellitus with other diabetic kidney complication: Secondary | ICD-10-CM | POA: Diagnosis not present

## 2020-04-09 DIAGNOSIS — Z9181 History of falling: Secondary | ICD-10-CM | POA: Diagnosis not present

## 2020-04-09 DIAGNOSIS — R279 Unspecified lack of coordination: Secondary | ICD-10-CM | POA: Diagnosis not present

## 2020-04-09 DIAGNOSIS — M84653D Pathological fracture in other disease, unspecified femur, subsequent encounter for fracture with routine healing: Secondary | ICD-10-CM | POA: Diagnosis not present

## 2020-04-09 DIAGNOSIS — R269 Unspecified abnormalities of gait and mobility: Secondary | ICD-10-CM | POA: Diagnosis not present

## 2020-04-09 DIAGNOSIS — M6281 Muscle weakness (generalized): Secondary | ICD-10-CM | POA: Diagnosis not present

## 2020-04-09 DIAGNOSIS — R2681 Unsteadiness on feet: Secondary | ICD-10-CM | POA: Diagnosis not present

## 2020-04-10 DIAGNOSIS — M84653D Pathological fracture in other disease, unspecified femur, subsequent encounter for fracture with routine healing: Secondary | ICD-10-CM | POA: Diagnosis not present

## 2020-04-10 DIAGNOSIS — R269 Unspecified abnormalities of gait and mobility: Secondary | ICD-10-CM | POA: Diagnosis not present

## 2020-04-10 DIAGNOSIS — Z9181 History of falling: Secondary | ICD-10-CM | POA: Diagnosis not present

## 2020-04-10 DIAGNOSIS — R2681 Unsteadiness on feet: Secondary | ICD-10-CM | POA: Diagnosis not present

## 2020-04-10 DIAGNOSIS — M6281 Muscle weakness (generalized): Secondary | ICD-10-CM | POA: Diagnosis not present

## 2020-04-10 DIAGNOSIS — R279 Unspecified lack of coordination: Secondary | ICD-10-CM | POA: Diagnosis not present

## 2020-04-13 DIAGNOSIS — Z741 Need for assistance with personal care: Secondary | ICD-10-CM | POA: Diagnosis not present

## 2020-04-13 DIAGNOSIS — Z9181 History of falling: Secondary | ICD-10-CM | POA: Diagnosis not present

## 2020-04-13 DIAGNOSIS — R2681 Unsteadiness on feet: Secondary | ICD-10-CM | POA: Diagnosis not present

## 2020-04-13 DIAGNOSIS — M6281 Muscle weakness (generalized): Secondary | ICD-10-CM | POA: Diagnosis not present

## 2020-04-13 DIAGNOSIS — R279 Unspecified lack of coordination: Secondary | ICD-10-CM | POA: Diagnosis not present

## 2020-04-13 DIAGNOSIS — R269 Unspecified abnormalities of gait and mobility: Secondary | ICD-10-CM | POA: Diagnosis not present

## 2020-04-13 DIAGNOSIS — M84653D Pathological fracture in other disease, unspecified femur, subsequent encounter for fracture with routine healing: Secondary | ICD-10-CM | POA: Diagnosis not present

## 2020-04-15 ENCOUNTER — Other Ambulatory Visit: Payer: Self-pay | Admitting: Family

## 2020-04-15 DIAGNOSIS — R279 Unspecified lack of coordination: Secondary | ICD-10-CM | POA: Diagnosis not present

## 2020-04-15 DIAGNOSIS — Z9181 History of falling: Secondary | ICD-10-CM | POA: Diagnosis not present

## 2020-04-15 DIAGNOSIS — R2681 Unsteadiness on feet: Secondary | ICD-10-CM | POA: Diagnosis not present

## 2020-04-15 DIAGNOSIS — Z741 Need for assistance with personal care: Secondary | ICD-10-CM | POA: Diagnosis not present

## 2020-04-15 DIAGNOSIS — M6281 Muscle weakness (generalized): Secondary | ICD-10-CM | POA: Diagnosis not present

## 2020-04-15 DIAGNOSIS — R269 Unspecified abnormalities of gait and mobility: Secondary | ICD-10-CM | POA: Diagnosis not present

## 2020-04-18 DIAGNOSIS — R279 Unspecified lack of coordination: Secondary | ICD-10-CM | POA: Diagnosis not present

## 2020-04-18 DIAGNOSIS — Z741 Need for assistance with personal care: Secondary | ICD-10-CM | POA: Diagnosis not present

## 2020-04-18 DIAGNOSIS — Z9181 History of falling: Secondary | ICD-10-CM | POA: Diagnosis not present

## 2020-04-18 DIAGNOSIS — R269 Unspecified abnormalities of gait and mobility: Secondary | ICD-10-CM | POA: Diagnosis not present

## 2020-04-18 DIAGNOSIS — M6281 Muscle weakness (generalized): Secondary | ICD-10-CM | POA: Diagnosis not present

## 2020-04-18 DIAGNOSIS — R2681 Unsteadiness on feet: Secondary | ICD-10-CM | POA: Diagnosis not present

## 2020-04-20 DIAGNOSIS — R269 Unspecified abnormalities of gait and mobility: Secondary | ICD-10-CM | POA: Diagnosis not present

## 2020-04-20 DIAGNOSIS — R279 Unspecified lack of coordination: Secondary | ICD-10-CM | POA: Diagnosis not present

## 2020-04-20 DIAGNOSIS — Z9181 History of falling: Secondary | ICD-10-CM | POA: Diagnosis not present

## 2020-04-20 DIAGNOSIS — Z741 Need for assistance with personal care: Secondary | ICD-10-CM | POA: Diagnosis not present

## 2020-04-20 DIAGNOSIS — R2681 Unsteadiness on feet: Secondary | ICD-10-CM | POA: Diagnosis not present

## 2020-04-20 DIAGNOSIS — M6281 Muscle weakness (generalized): Secondary | ICD-10-CM | POA: Diagnosis not present

## 2020-04-25 DIAGNOSIS — Z9181 History of falling: Secondary | ICD-10-CM | POA: Diagnosis not present

## 2020-04-25 DIAGNOSIS — R2681 Unsteadiness on feet: Secondary | ICD-10-CM | POA: Diagnosis not present

## 2020-04-25 DIAGNOSIS — R269 Unspecified abnormalities of gait and mobility: Secondary | ICD-10-CM | POA: Diagnosis not present

## 2020-04-25 DIAGNOSIS — R279 Unspecified lack of coordination: Secondary | ICD-10-CM | POA: Diagnosis not present

## 2020-04-25 DIAGNOSIS — Z741 Need for assistance with personal care: Secondary | ICD-10-CM | POA: Diagnosis not present

## 2020-04-25 DIAGNOSIS — M6281 Muscle weakness (generalized): Secondary | ICD-10-CM | POA: Diagnosis not present

## 2020-04-27 DIAGNOSIS — R2681 Unsteadiness on feet: Secondary | ICD-10-CM | POA: Diagnosis not present

## 2020-04-27 DIAGNOSIS — Z741 Need for assistance with personal care: Secondary | ICD-10-CM | POA: Diagnosis not present

## 2020-04-27 DIAGNOSIS — M6281 Muscle weakness (generalized): Secondary | ICD-10-CM | POA: Diagnosis not present

## 2020-04-27 DIAGNOSIS — R269 Unspecified abnormalities of gait and mobility: Secondary | ICD-10-CM | POA: Diagnosis not present

## 2020-04-27 DIAGNOSIS — R279 Unspecified lack of coordination: Secondary | ICD-10-CM | POA: Diagnosis not present

## 2020-04-27 DIAGNOSIS — Z9181 History of falling: Secondary | ICD-10-CM | POA: Diagnosis not present

## 2020-04-28 DIAGNOSIS — M6281 Muscle weakness (generalized): Secondary | ICD-10-CM | POA: Diagnosis not present

## 2020-04-28 DIAGNOSIS — R2681 Unsteadiness on feet: Secondary | ICD-10-CM | POA: Diagnosis not present

## 2020-04-28 DIAGNOSIS — R279 Unspecified lack of coordination: Secondary | ICD-10-CM | POA: Diagnosis not present

## 2020-04-28 DIAGNOSIS — Z741 Need for assistance with personal care: Secondary | ICD-10-CM | POA: Diagnosis not present

## 2020-04-28 DIAGNOSIS — Z9181 History of falling: Secondary | ICD-10-CM | POA: Diagnosis not present

## 2020-04-28 DIAGNOSIS — R269 Unspecified abnormalities of gait and mobility: Secondary | ICD-10-CM | POA: Diagnosis not present

## 2020-05-02 DIAGNOSIS — R2681 Unsteadiness on feet: Secondary | ICD-10-CM | POA: Diagnosis not present

## 2020-05-02 DIAGNOSIS — Z9181 History of falling: Secondary | ICD-10-CM | POA: Diagnosis not present

## 2020-05-02 DIAGNOSIS — R269 Unspecified abnormalities of gait and mobility: Secondary | ICD-10-CM | POA: Diagnosis not present

## 2020-05-02 DIAGNOSIS — Z741 Need for assistance with personal care: Secondary | ICD-10-CM | POA: Diagnosis not present

## 2020-05-02 DIAGNOSIS — M6281 Muscle weakness (generalized): Secondary | ICD-10-CM | POA: Diagnosis not present

## 2020-05-02 DIAGNOSIS — R279 Unspecified lack of coordination: Secondary | ICD-10-CM | POA: Diagnosis not present

## 2020-05-04 DIAGNOSIS — R2681 Unsteadiness on feet: Secondary | ICD-10-CM | POA: Diagnosis not present

## 2020-05-04 DIAGNOSIS — Z741 Need for assistance with personal care: Secondary | ICD-10-CM | POA: Diagnosis not present

## 2020-05-04 DIAGNOSIS — Z9181 History of falling: Secondary | ICD-10-CM | POA: Diagnosis not present

## 2020-05-04 DIAGNOSIS — M6281 Muscle weakness (generalized): Secondary | ICD-10-CM | POA: Diagnosis not present

## 2020-05-04 DIAGNOSIS — R279 Unspecified lack of coordination: Secondary | ICD-10-CM | POA: Diagnosis not present

## 2020-05-04 DIAGNOSIS — R269 Unspecified abnormalities of gait and mobility: Secondary | ICD-10-CM | POA: Diagnosis not present

## 2020-05-05 DIAGNOSIS — R2681 Unsteadiness on feet: Secondary | ICD-10-CM | POA: Diagnosis not present

## 2020-05-05 DIAGNOSIS — Z9181 History of falling: Secondary | ICD-10-CM | POA: Diagnosis not present

## 2020-05-05 DIAGNOSIS — R269 Unspecified abnormalities of gait and mobility: Secondary | ICD-10-CM | POA: Diagnosis not present

## 2020-05-05 DIAGNOSIS — R279 Unspecified lack of coordination: Secondary | ICD-10-CM | POA: Diagnosis not present

## 2020-05-05 DIAGNOSIS — Z741 Need for assistance with personal care: Secondary | ICD-10-CM | POA: Diagnosis not present

## 2020-05-05 DIAGNOSIS — M6281 Muscle weakness (generalized): Secondary | ICD-10-CM | POA: Diagnosis not present

## 2020-05-07 DIAGNOSIS — R269 Unspecified abnormalities of gait and mobility: Secondary | ICD-10-CM | POA: Diagnosis not present

## 2020-05-07 DIAGNOSIS — Z9181 History of falling: Secondary | ICD-10-CM | POA: Diagnosis not present

## 2020-05-07 DIAGNOSIS — M6281 Muscle weakness (generalized): Secondary | ICD-10-CM | POA: Diagnosis not present

## 2020-05-07 DIAGNOSIS — R2681 Unsteadiness on feet: Secondary | ICD-10-CM | POA: Diagnosis not present

## 2020-05-07 DIAGNOSIS — R279 Unspecified lack of coordination: Secondary | ICD-10-CM | POA: Diagnosis not present

## 2020-05-07 DIAGNOSIS — Z741 Need for assistance with personal care: Secondary | ICD-10-CM | POA: Diagnosis not present

## 2020-05-09 DIAGNOSIS — Z9181 History of falling: Secondary | ICD-10-CM | POA: Diagnosis not present

## 2020-05-09 DIAGNOSIS — R269 Unspecified abnormalities of gait and mobility: Secondary | ICD-10-CM | POA: Diagnosis not present

## 2020-05-09 DIAGNOSIS — R2681 Unsteadiness on feet: Secondary | ICD-10-CM | POA: Diagnosis not present

## 2020-05-09 DIAGNOSIS — Z741 Need for assistance with personal care: Secondary | ICD-10-CM | POA: Diagnosis not present

## 2020-05-09 DIAGNOSIS — M6281 Muscle weakness (generalized): Secondary | ICD-10-CM | POA: Diagnosis not present

## 2020-05-09 DIAGNOSIS — R279 Unspecified lack of coordination: Secondary | ICD-10-CM | POA: Diagnosis not present

## 2020-05-11 DIAGNOSIS — M6281 Muscle weakness (generalized): Secondary | ICD-10-CM | POA: Diagnosis not present

## 2020-05-11 DIAGNOSIS — Z741 Need for assistance with personal care: Secondary | ICD-10-CM | POA: Diagnosis not present

## 2020-05-11 DIAGNOSIS — R279 Unspecified lack of coordination: Secondary | ICD-10-CM | POA: Diagnosis not present

## 2020-05-11 DIAGNOSIS — R2681 Unsteadiness on feet: Secondary | ICD-10-CM | POA: Diagnosis not present

## 2020-05-11 DIAGNOSIS — M84653D Pathological fracture in other disease, unspecified femur, subsequent encounter for fracture with routine healing: Secondary | ICD-10-CM | POA: Diagnosis not present

## 2020-05-11 DIAGNOSIS — R269 Unspecified abnormalities of gait and mobility: Secondary | ICD-10-CM | POA: Diagnosis not present

## 2020-05-11 DIAGNOSIS — Z9181 History of falling: Secondary | ICD-10-CM | POA: Diagnosis not present

## 2020-05-13 ENCOUNTER — Ambulatory Visit (INDEPENDENT_AMBULATORY_CARE_PROVIDER_SITE_OTHER): Payer: Medicare Other

## 2020-05-13 DIAGNOSIS — R269 Unspecified abnormalities of gait and mobility: Secondary | ICD-10-CM | POA: Diagnosis not present

## 2020-05-13 DIAGNOSIS — Z741 Need for assistance with personal care: Secondary | ICD-10-CM | POA: Diagnosis not present

## 2020-05-13 DIAGNOSIS — M6281 Muscle weakness (generalized): Secondary | ICD-10-CM | POA: Diagnosis not present

## 2020-05-13 DIAGNOSIS — I442 Atrioventricular block, complete: Secondary | ICD-10-CM

## 2020-05-13 DIAGNOSIS — Z9181 History of falling: Secondary | ICD-10-CM | POA: Diagnosis not present

## 2020-05-13 DIAGNOSIS — R279 Unspecified lack of coordination: Secondary | ICD-10-CM | POA: Diagnosis not present

## 2020-05-13 DIAGNOSIS — R2681 Unsteadiness on feet: Secondary | ICD-10-CM | POA: Diagnosis not present

## 2020-05-14 DIAGNOSIS — R269 Unspecified abnormalities of gait and mobility: Secondary | ICD-10-CM | POA: Diagnosis not present

## 2020-05-14 DIAGNOSIS — R2681 Unsteadiness on feet: Secondary | ICD-10-CM | POA: Diagnosis not present

## 2020-05-14 DIAGNOSIS — R279 Unspecified lack of coordination: Secondary | ICD-10-CM | POA: Diagnosis not present

## 2020-05-14 DIAGNOSIS — M6281 Muscle weakness (generalized): Secondary | ICD-10-CM | POA: Diagnosis not present

## 2020-05-14 DIAGNOSIS — Z9181 History of falling: Secondary | ICD-10-CM | POA: Diagnosis not present

## 2020-05-14 DIAGNOSIS — Z741 Need for assistance with personal care: Secondary | ICD-10-CM | POA: Diagnosis not present

## 2020-05-16 DIAGNOSIS — Z9181 History of falling: Secondary | ICD-10-CM | POA: Diagnosis not present

## 2020-05-16 DIAGNOSIS — R269 Unspecified abnormalities of gait and mobility: Secondary | ICD-10-CM | POA: Diagnosis not present

## 2020-05-16 DIAGNOSIS — R2681 Unsteadiness on feet: Secondary | ICD-10-CM | POA: Diagnosis not present

## 2020-05-16 DIAGNOSIS — R279 Unspecified lack of coordination: Secondary | ICD-10-CM | POA: Diagnosis not present

## 2020-05-16 DIAGNOSIS — Z741 Need for assistance with personal care: Secondary | ICD-10-CM | POA: Diagnosis not present

## 2020-05-16 DIAGNOSIS — M6281 Muscle weakness (generalized): Secondary | ICD-10-CM | POA: Diagnosis not present

## 2020-05-16 LAB — CUP PACEART REMOTE DEVICE CHECK
Battery Remaining Longevity: 42 mo
Battery Remaining Percentage: 34 %
Battery Voltage: 2.83 V
Brady Statistic AP VP Percent: 1 %
Brady Statistic AP VS Percent: 1 %
Brady Statistic AS VP Percent: 99 %
Brady Statistic AS VS Percent: 1 %
Brady Statistic RA Percent Paced: 1 %
Brady Statistic RV Percent Paced: 99 %
Date Time Interrogation Session: 20220304051114
Implantable Lead Implant Date: 20140703
Implantable Lead Implant Date: 20140703
Implantable Lead Location: 753859
Implantable Lead Location: 753860
Implantable Pulse Generator Implant Date: 20140703
Lead Channel Impedance Value: 450 Ohm
Lead Channel Impedance Value: 640 Ohm
Lead Channel Pacing Threshold Amplitude: 0.5 V
Lead Channel Pacing Threshold Amplitude: 1.25 V
Lead Channel Pacing Threshold Pulse Width: 0.4 ms
Lead Channel Pacing Threshold Pulse Width: 0.4 ms
Lead Channel Sensing Intrinsic Amplitude: 1.8 mV
Lead Channel Sensing Intrinsic Amplitude: 12 mV
Lead Channel Setting Pacing Amplitude: 1.5 V
Lead Channel Setting Pacing Amplitude: 2 V
Lead Channel Setting Pacing Pulse Width: 0.4 ms
Lead Channel Setting Sensing Sensitivity: 12.5 mV
Pulse Gen Model: 2240
Pulse Gen Serial Number: 7522008

## 2020-05-17 DIAGNOSIS — R279 Unspecified lack of coordination: Secondary | ICD-10-CM | POA: Diagnosis not present

## 2020-05-17 DIAGNOSIS — R2681 Unsteadiness on feet: Secondary | ICD-10-CM | POA: Diagnosis not present

## 2020-05-17 DIAGNOSIS — R269 Unspecified abnormalities of gait and mobility: Secondary | ICD-10-CM | POA: Diagnosis not present

## 2020-05-17 DIAGNOSIS — Z9181 History of falling: Secondary | ICD-10-CM | POA: Diagnosis not present

## 2020-05-17 DIAGNOSIS — M6281 Muscle weakness (generalized): Secondary | ICD-10-CM | POA: Diagnosis not present

## 2020-05-17 DIAGNOSIS — Z741 Need for assistance with personal care: Secondary | ICD-10-CM | POA: Diagnosis not present

## 2020-05-20 DIAGNOSIS — R279 Unspecified lack of coordination: Secondary | ICD-10-CM | POA: Diagnosis not present

## 2020-05-20 DIAGNOSIS — R2681 Unsteadiness on feet: Secondary | ICD-10-CM | POA: Diagnosis not present

## 2020-05-20 DIAGNOSIS — M6281 Muscle weakness (generalized): Secondary | ICD-10-CM | POA: Diagnosis not present

## 2020-05-20 DIAGNOSIS — R269 Unspecified abnormalities of gait and mobility: Secondary | ICD-10-CM | POA: Diagnosis not present

## 2020-05-20 DIAGNOSIS — Z9181 History of falling: Secondary | ICD-10-CM | POA: Diagnosis not present

## 2020-05-20 DIAGNOSIS — Z741 Need for assistance with personal care: Secondary | ICD-10-CM | POA: Diagnosis not present

## 2020-05-23 DIAGNOSIS — Z9181 History of falling: Secondary | ICD-10-CM | POA: Diagnosis not present

## 2020-05-23 DIAGNOSIS — R2681 Unsteadiness on feet: Secondary | ICD-10-CM | POA: Diagnosis not present

## 2020-05-23 DIAGNOSIS — R279 Unspecified lack of coordination: Secondary | ICD-10-CM | POA: Diagnosis not present

## 2020-05-23 DIAGNOSIS — M6281 Muscle weakness (generalized): Secondary | ICD-10-CM | POA: Diagnosis not present

## 2020-05-23 DIAGNOSIS — Z741 Need for assistance with personal care: Secondary | ICD-10-CM | POA: Diagnosis not present

## 2020-05-23 DIAGNOSIS — R269 Unspecified abnormalities of gait and mobility: Secondary | ICD-10-CM | POA: Diagnosis not present

## 2020-05-24 NOTE — Progress Notes (Signed)
Remote pacemaker transmission.   

## 2020-05-25 DIAGNOSIS — R269 Unspecified abnormalities of gait and mobility: Secondary | ICD-10-CM | POA: Diagnosis not present

## 2020-05-25 DIAGNOSIS — R279 Unspecified lack of coordination: Secondary | ICD-10-CM | POA: Diagnosis not present

## 2020-05-25 DIAGNOSIS — Z9181 History of falling: Secondary | ICD-10-CM | POA: Diagnosis not present

## 2020-05-25 DIAGNOSIS — R2681 Unsteadiness on feet: Secondary | ICD-10-CM | POA: Diagnosis not present

## 2020-05-25 DIAGNOSIS — Z741 Need for assistance with personal care: Secondary | ICD-10-CM | POA: Diagnosis not present

## 2020-05-25 DIAGNOSIS — M6281 Muscle weakness (generalized): Secondary | ICD-10-CM | POA: Diagnosis not present

## 2020-05-26 DIAGNOSIS — M6281 Muscle weakness (generalized): Secondary | ICD-10-CM | POA: Diagnosis not present

## 2020-05-26 DIAGNOSIS — R2681 Unsteadiness on feet: Secondary | ICD-10-CM | POA: Diagnosis not present

## 2020-05-26 DIAGNOSIS — R279 Unspecified lack of coordination: Secondary | ICD-10-CM | POA: Diagnosis not present

## 2020-05-26 DIAGNOSIS — R269 Unspecified abnormalities of gait and mobility: Secondary | ICD-10-CM | POA: Diagnosis not present

## 2020-05-26 DIAGNOSIS — Z9181 History of falling: Secondary | ICD-10-CM | POA: Diagnosis not present

## 2020-05-26 DIAGNOSIS — Z741 Need for assistance with personal care: Secondary | ICD-10-CM | POA: Diagnosis not present

## 2020-05-28 DIAGNOSIS — R269 Unspecified abnormalities of gait and mobility: Secondary | ICD-10-CM | POA: Diagnosis not present

## 2020-05-28 DIAGNOSIS — Z9181 History of falling: Secondary | ICD-10-CM | POA: Diagnosis not present

## 2020-05-28 DIAGNOSIS — R279 Unspecified lack of coordination: Secondary | ICD-10-CM | POA: Diagnosis not present

## 2020-05-28 DIAGNOSIS — R2681 Unsteadiness on feet: Secondary | ICD-10-CM | POA: Diagnosis not present

## 2020-05-28 DIAGNOSIS — M6281 Muscle weakness (generalized): Secondary | ICD-10-CM | POA: Diagnosis not present

## 2020-05-28 DIAGNOSIS — Z741 Need for assistance with personal care: Secondary | ICD-10-CM | POA: Diagnosis not present

## 2020-05-30 DIAGNOSIS — Z9181 History of falling: Secondary | ICD-10-CM | POA: Diagnosis not present

## 2020-05-30 DIAGNOSIS — R2681 Unsteadiness on feet: Secondary | ICD-10-CM | POA: Diagnosis not present

## 2020-05-30 DIAGNOSIS — Z741 Need for assistance with personal care: Secondary | ICD-10-CM | POA: Diagnosis not present

## 2020-05-30 DIAGNOSIS — R269 Unspecified abnormalities of gait and mobility: Secondary | ICD-10-CM | POA: Diagnosis not present

## 2020-05-30 DIAGNOSIS — M6281 Muscle weakness (generalized): Secondary | ICD-10-CM | POA: Diagnosis not present

## 2020-05-30 DIAGNOSIS — R279 Unspecified lack of coordination: Secondary | ICD-10-CM | POA: Diagnosis not present

## 2020-05-31 DIAGNOSIS — Z741 Need for assistance with personal care: Secondary | ICD-10-CM | POA: Diagnosis not present

## 2020-05-31 DIAGNOSIS — M6281 Muscle weakness (generalized): Secondary | ICD-10-CM | POA: Diagnosis not present

## 2020-05-31 DIAGNOSIS — Z1152 Encounter for screening for COVID-19: Secondary | ICD-10-CM | POA: Diagnosis not present

## 2020-05-31 DIAGNOSIS — J90 Pleural effusion, not elsewhere classified: Secondary | ICD-10-CM | POA: Diagnosis not present

## 2020-05-31 DIAGNOSIS — R269 Unspecified abnormalities of gait and mobility: Secondary | ICD-10-CM | POA: Diagnosis not present

## 2020-05-31 DIAGNOSIS — I129 Hypertensive chronic kidney disease with stage 1 through stage 4 chronic kidney disease, or unspecified chronic kidney disease: Secondary | ICD-10-CM | POA: Diagnosis not present

## 2020-05-31 DIAGNOSIS — J189 Pneumonia, unspecified organism: Secondary | ICD-10-CM | POA: Diagnosis not present

## 2020-05-31 DIAGNOSIS — N1831 Chronic kidney disease, stage 3a: Secondary | ICD-10-CM | POA: Diagnosis not present

## 2020-05-31 DIAGNOSIS — D72829 Elevated white blood cell count, unspecified: Secondary | ICD-10-CM | POA: Diagnosis not present

## 2020-05-31 DIAGNOSIS — J449 Chronic obstructive pulmonary disease, unspecified: Secondary | ICD-10-CM | POA: Diagnosis not present

## 2020-05-31 DIAGNOSIS — R059 Cough, unspecified: Secondary | ICD-10-CM | POA: Diagnosis not present

## 2020-05-31 DIAGNOSIS — R112 Nausea with vomiting, unspecified: Secondary | ICD-10-CM | POA: Diagnosis not present

## 2020-05-31 DIAGNOSIS — Z9181 History of falling: Secondary | ICD-10-CM | POA: Diagnosis not present

## 2020-05-31 DIAGNOSIS — R2681 Unsteadiness on feet: Secondary | ICD-10-CM | POA: Diagnosis not present

## 2020-05-31 DIAGNOSIS — R279 Unspecified lack of coordination: Secondary | ICD-10-CM | POA: Diagnosis not present

## 2020-06-01 DIAGNOSIS — Z9181 History of falling: Secondary | ICD-10-CM | POA: Diagnosis not present

## 2020-06-01 DIAGNOSIS — R2681 Unsteadiness on feet: Secondary | ICD-10-CM | POA: Diagnosis not present

## 2020-06-01 DIAGNOSIS — R269 Unspecified abnormalities of gait and mobility: Secondary | ICD-10-CM | POA: Diagnosis not present

## 2020-06-01 DIAGNOSIS — Z741 Need for assistance with personal care: Secondary | ICD-10-CM | POA: Diagnosis not present

## 2020-06-01 DIAGNOSIS — R279 Unspecified lack of coordination: Secondary | ICD-10-CM | POA: Diagnosis not present

## 2020-06-01 DIAGNOSIS — M6281 Muscle weakness (generalized): Secondary | ICD-10-CM | POA: Diagnosis not present

## 2020-06-02 ENCOUNTER — Inpatient Hospital Stay (HOSPITAL_COMMUNITY)
Admission: EM | Admit: 2020-06-02 | Discharge: 2020-06-05 | DRG: 177 | Disposition: A | Discharge status: Skilled Nursing Facility | Payer: Medicare Other | Attending: Internal Medicine | Admitting: Internal Medicine

## 2020-06-02 ENCOUNTER — Inpatient Hospital Stay (HOSPITAL_COMMUNITY): Payer: Medicare Other

## 2020-06-02 ENCOUNTER — Encounter (HOSPITAL_COMMUNITY): Payer: Self-pay

## 2020-06-02 ENCOUNTER — Emergency Department (HOSPITAL_COMMUNITY): Payer: Medicare Other

## 2020-06-02 ENCOUNTER — Other Ambulatory Visit: Payer: Self-pay

## 2020-06-02 DIAGNOSIS — J449 Chronic obstructive pulmonary disease, unspecified: Secondary | ICD-10-CM | POA: Diagnosis not present

## 2020-06-02 DIAGNOSIS — M6281 Muscle weakness (generalized): Secondary | ICD-10-CM | POA: Diagnosis not present

## 2020-06-02 DIAGNOSIS — Z8616 Personal history of COVID-19: Secondary | ICD-10-CM | POA: Diagnosis not present

## 2020-06-02 DIAGNOSIS — Z681 Body mass index (BMI) 19 or less, adult: Secondary | ICD-10-CM

## 2020-06-02 DIAGNOSIS — Z79899 Other long term (current) drug therapy: Secondary | ICD-10-CM

## 2020-06-02 DIAGNOSIS — R278 Other lack of coordination: Secondary | ICD-10-CM | POA: Diagnosis not present

## 2020-06-02 DIAGNOSIS — R000 Tachycardia, unspecified: Secondary | ICD-10-CM | POA: Diagnosis not present

## 2020-06-02 DIAGNOSIS — K449 Diaphragmatic hernia without obstruction or gangrene: Secondary | ICD-10-CM | POA: Diagnosis not present

## 2020-06-02 DIAGNOSIS — D649 Anemia, unspecified: Secondary | ICD-10-CM | POA: Diagnosis not present

## 2020-06-02 DIAGNOSIS — F32A Depression, unspecified: Secondary | ICD-10-CM | POA: Diagnosis present

## 2020-06-02 DIAGNOSIS — K219 Gastro-esophageal reflux disease without esophagitis: Secondary | ICD-10-CM | POA: Diagnosis present

## 2020-06-02 DIAGNOSIS — J9601 Acute respiratory failure with hypoxia: Secondary | ICD-10-CM | POA: Diagnosis present

## 2020-06-02 DIAGNOSIS — I10 Essential (primary) hypertension: Secondary | ICD-10-CM | POA: Diagnosis not present

## 2020-06-02 DIAGNOSIS — R531 Weakness: Secondary | ICD-10-CM

## 2020-06-02 DIAGNOSIS — R0602 Shortness of breath: Secondary | ICD-10-CM | POA: Diagnosis not present

## 2020-06-02 DIAGNOSIS — J690 Pneumonitis due to inhalation of food and vomit: Secondary | ICD-10-CM | POA: Diagnosis present

## 2020-06-02 DIAGNOSIS — R54 Age-related physical debility: Secondary | ICD-10-CM | POA: Diagnosis present

## 2020-06-02 DIAGNOSIS — R2681 Unsteadiness on feet: Secondary | ICD-10-CM | POA: Diagnosis not present

## 2020-06-02 DIAGNOSIS — R2689 Other abnormalities of gait and mobility: Secondary | ICD-10-CM | POA: Diagnosis not present

## 2020-06-02 DIAGNOSIS — J181 Lobar pneumonia, unspecified organism: Secondary | ICD-10-CM | POA: Diagnosis not present

## 2020-06-02 DIAGNOSIS — J90 Pleural effusion, not elsewhere classified: Secondary | ICD-10-CM | POA: Diagnosis not present

## 2020-06-02 DIAGNOSIS — Z803 Family history of malignant neoplasm of breast: Secondary | ICD-10-CM

## 2020-06-02 DIAGNOSIS — Z993 Dependence on wheelchair: Secondary | ICD-10-CM | POA: Diagnosis not present

## 2020-06-02 DIAGNOSIS — I495 Sick sinus syndrome: Secondary | ICD-10-CM | POA: Diagnosis present

## 2020-06-02 DIAGNOSIS — I442 Atrioventricular block, complete: Secondary | ICD-10-CM | POA: Diagnosis not present

## 2020-06-02 DIAGNOSIS — E46 Unspecified protein-calorie malnutrition: Secondary | ICD-10-CM | POA: Diagnosis present

## 2020-06-02 DIAGNOSIS — E1165 Type 2 diabetes mellitus with hyperglycemia: Secondary | ICD-10-CM | POA: Diagnosis not present

## 2020-06-02 DIAGNOSIS — E1142 Type 2 diabetes mellitus with diabetic polyneuropathy: Secondary | ICD-10-CM | POA: Diagnosis present

## 2020-06-02 DIAGNOSIS — Z950 Presence of cardiac pacemaker: Secondary | ICD-10-CM | POA: Diagnosis not present

## 2020-06-02 DIAGNOSIS — Z8674 Personal history of sudden cardiac arrest: Secondary | ICD-10-CM

## 2020-06-02 DIAGNOSIS — J189 Pneumonia, unspecified organism: Secondary | ICD-10-CM | POA: Diagnosis not present

## 2020-06-02 DIAGNOSIS — M7989 Other specified soft tissue disorders: Secondary | ICD-10-CM | POA: Diagnosis not present

## 2020-06-02 DIAGNOSIS — Z7984 Long term (current) use of oral hypoglycemic drugs: Secondary | ICD-10-CM

## 2020-06-02 DIAGNOSIS — E43 Unspecified severe protein-calorie malnutrition: Secondary | ICD-10-CM | POA: Diagnosis not present

## 2020-06-02 DIAGNOSIS — M2550 Pain in unspecified joint: Secondary | ICD-10-CM | POA: Diagnosis not present

## 2020-06-02 DIAGNOSIS — E1151 Type 2 diabetes mellitus with diabetic peripheral angiopathy without gangrene: Secondary | ICD-10-CM | POA: Diagnosis present

## 2020-06-02 DIAGNOSIS — J918 Pleural effusion in other conditions classified elsewhere: Secondary | ICD-10-CM | POA: Diagnosis not present

## 2020-06-02 DIAGNOSIS — U071 COVID-19: Secondary | ICD-10-CM | POA: Diagnosis not present

## 2020-06-02 DIAGNOSIS — R0902 Hypoxemia: Secondary | ICD-10-CM | POA: Diagnosis not present

## 2020-06-02 DIAGNOSIS — Z7951 Long term (current) use of inhaled steroids: Secondary | ICD-10-CM

## 2020-06-02 DIAGNOSIS — M810 Age-related osteoporosis without current pathological fracture: Secondary | ICD-10-CM | POA: Diagnosis present

## 2020-06-02 DIAGNOSIS — Z7983 Long term (current) use of bisphosphonates: Secondary | ICD-10-CM

## 2020-06-02 DIAGNOSIS — Z8249 Family history of ischemic heart disease and other diseases of the circulatory system: Secondary | ICD-10-CM | POA: Diagnosis not present

## 2020-06-02 DIAGNOSIS — Z7401 Bed confinement status: Secondary | ICD-10-CM | POA: Diagnosis not present

## 2020-06-02 DIAGNOSIS — J440 Chronic obstructive pulmonary disease with acute lower respiratory infection: Secondary | ICD-10-CM | POA: Diagnosis present

## 2020-06-02 DIAGNOSIS — R41841 Cognitive communication deficit: Secondary | ICD-10-CM | POA: Diagnosis not present

## 2020-06-02 DIAGNOSIS — I959 Hypotension, unspecified: Secondary | ICD-10-CM | POA: Diagnosis not present

## 2020-06-02 DIAGNOSIS — E119 Type 2 diabetes mellitus without complications: Secondary | ICD-10-CM

## 2020-06-02 DIAGNOSIS — Z808 Family history of malignant neoplasm of other organs or systems: Secondary | ICD-10-CM

## 2020-06-02 DIAGNOSIS — R627 Adult failure to thrive: Secondary | ICD-10-CM | POA: Diagnosis not present

## 2020-06-02 DIAGNOSIS — H409 Unspecified glaucoma: Secondary | ICD-10-CM | POA: Diagnosis present

## 2020-06-02 DIAGNOSIS — Z794 Long term (current) use of insulin: Secondary | ICD-10-CM

## 2020-06-02 DIAGNOSIS — E785 Hyperlipidemia, unspecified: Secondary | ICD-10-CM | POA: Diagnosis present

## 2020-06-02 DIAGNOSIS — Z66 Do not resuscitate: Secondary | ICD-10-CM | POA: Diagnosis present

## 2020-06-02 DIAGNOSIS — Z7982 Long term (current) use of aspirin: Secondary | ICD-10-CM

## 2020-06-02 DIAGNOSIS — Z841 Family history of disorders of kidney and ureter: Secondary | ICD-10-CM

## 2020-06-02 DIAGNOSIS — J9600 Acute respiratory failure, unspecified whether with hypoxia or hypercapnia: Secondary | ICD-10-CM | POA: Diagnosis not present

## 2020-06-02 DIAGNOSIS — R2242 Localized swelling, mass and lump, left lower limb: Secondary | ICD-10-CM

## 2020-06-02 DIAGNOSIS — M6259 Muscle wasting and atrophy, not elsewhere classified, multiple sites: Secondary | ICD-10-CM | POA: Diagnosis not present

## 2020-06-02 DIAGNOSIS — R1312 Dysphagia, oropharyngeal phase: Secondary | ICD-10-CM | POA: Diagnosis not present

## 2020-06-02 HISTORY — DX: Pneumonia, unspecified organism: J18.9

## 2020-06-02 LAB — COMPREHENSIVE METABOLIC PANEL
ALT: 40 U/L (ref 0–44)
AST: 35 U/L (ref 15–41)
Albumin: 3.1 g/dL — ABNORMAL LOW (ref 3.5–5.0)
Alkaline Phosphatase: 46 U/L (ref 38–126)
Anion gap: 9 (ref 5–15)
BUN: 39 mg/dL — ABNORMAL HIGH (ref 8–23)
CO2: 24 mmol/L (ref 22–32)
Calcium: 9 mg/dL (ref 8.9–10.3)
Chloride: 105 mmol/L (ref 98–111)
Creatinine, Ser: 0.79 mg/dL (ref 0.44–1.00)
GFR, Estimated: >60 mL/min (ref >60)
Glucose, Bld: 130 mg/dL — ABNORMAL HIGH (ref 70–99)
Potassium: 3.8 mmol/L (ref 3.5–5.1)
Sodium: 138 mmol/L (ref 135–145)
Total Bilirubin: 0.6 mg/dL (ref 0.3–1.2)
Total Protein: 5.4 g/dL — ABNORMAL LOW (ref 6.5–8.1)

## 2020-06-02 LAB — RESP PANEL BY RT-PCR (FLU A&B, COVID) ARPGX2
Influenza A by PCR: NEGATIVE
Influenza B by PCR: NEGATIVE
SARS Coronavirus 2 by RT PCR: POSITIVE — AB

## 2020-06-02 LAB — CBC WITH DIFFERENTIAL/PLATELET
Abs Immature Granulocytes: 0.05 10*3/uL (ref 0.00–0.07)
Basophils Absolute: 0 10*3/uL (ref 0.0–0.1)
Basophils Relative: 0 %
Eosinophils Absolute: 0.1 10*3/uL (ref 0.0–0.5)
Eosinophils Relative: 1 %
HCT: 29.8 % — ABNORMAL LOW (ref 36.0–46.0)
Hemoglobin: 9.2 g/dL — ABNORMAL LOW (ref 12.0–15.0)
Immature Granulocytes: 1 %
Lymphocytes Relative: 10 %
Lymphs Abs: 1 10*3/uL (ref 0.7–4.0)
MCH: 27.7 pg (ref 26.0–34.0)
MCHC: 30.9 g/dL (ref 30.0–36.0)
MCV: 89.8 fL (ref 80.0–100.0)
Monocytes Absolute: 0.4 10*3/uL (ref 0.1–1.0)
Monocytes Relative: 5 %
Neutro Abs: 7.7 10*3/uL (ref 1.7–7.7)
Neutrophils Relative %: 83 %
Platelets: 243 10*3/uL (ref 150–400)
RBC: 3.32 MIL/uL — ABNORMAL LOW (ref 3.87–5.11)
RDW: 15.9 % — ABNORMAL HIGH (ref 11.5–15.5)
WBC: 9.3 10*3/uL (ref 4.0–10.5)
nRBC: 0 % (ref 0.0–0.2)

## 2020-06-02 LAB — D-DIMER, QUANTITATIVE: D-Dimer, Quant: 1.23 ug/mL-FEU — ABNORMAL HIGH (ref 0.00–0.50)

## 2020-06-02 LAB — PROCALCITONIN: Procalcitonin: 1.3 ng/mL

## 2020-06-02 LAB — C-REACTIVE PROTEIN: CRP: 7.3 mg/dL — ABNORMAL HIGH (ref <1.0)

## 2020-06-02 LAB — URINALYSIS, ROUTINE W REFLEX MICROSCOPIC
Bilirubin Urine: NEGATIVE
Glucose, UA: NEGATIVE mg/dL
Ketones, ur: 5 mg/dL — AB
Nitrite: NEGATIVE
Protein, ur: NEGATIVE mg/dL
Specific Gravity, Urine: 1.019 (ref 1.005–1.030)
pH: 5 (ref 5.0–8.0)

## 2020-06-02 LAB — LACTIC ACID, PLASMA: Lactic Acid, Venous: 1.8 mmol/L (ref 0.5–1.9)

## 2020-06-02 MED ORDER — MIRABEGRON ER 25 MG PO TB24
25.0000 mg | ORAL_TABLET | Freq: Every day | ORAL | Status: DC
  Administered 2020-06-03 – 2020-06-05 (×3): 25 mg via ORAL
  Filled 2020-06-02 (×3): qty 1

## 2020-06-02 MED ORDER — IPRATROPIUM-ALBUTEROL 20-100 MCG/ACT IN AERS
1.0000 | INHALATION_SPRAY | Freq: Three times a day (TID) | RESPIRATORY_TRACT | Status: DC | PRN
  Administered 2020-06-03: 1 via RESPIRATORY_TRACT
  Filled 2020-06-02: qty 4

## 2020-06-02 MED ORDER — SERTRALINE HCL 50 MG PO TABS: 50.0000 mg | ORAL_TABLET | Freq: Two times a day (BID) | ORAL | Status: DC

## 2020-06-02 MED ORDER — SODIUM CHLORIDE 0.9 % IV SOLN
500.0000 mg | Freq: Once | INTRAVENOUS | Status: DC
  Filled 2020-06-02: qty 500

## 2020-06-02 MED ORDER — SODIUM CHLORIDE 0.9 % IV SOLN
2.0000 g | INTRAVENOUS | Status: DC
  Administered 2020-06-03 – 2020-06-05 (×3): 2 g via INTRAVENOUS
  Filled 2020-06-02: qty 20
  Filled 2020-06-02 (×2): qty 2

## 2020-06-02 MED ORDER — ACETAMINOPHEN 325 MG PO TABS
650.0000 mg | ORAL_TABLET | Freq: Four times a day (QID) | ORAL | Status: DC | PRN
  Administered 2020-06-03: 650 mg via ORAL
  Filled 2020-06-02: qty 2

## 2020-06-02 MED ORDER — INSULIN GLARGINE 100 UNIT/ML ~~LOC~~ SOLN
10.0000 [IU] | Freq: Every day | SUBCUTANEOUS | Status: DC
  Administered 2020-06-02 – 2020-06-04 (×3): 10 [IU] via SUBCUTANEOUS
  Filled 2020-06-02 (×3): qty 0.1

## 2020-06-02 MED ORDER — SODIUM CHLORIDE 0.9% FLUSH
3.0000 mL | Freq: Two times a day (BID) | INTRAVENOUS | Status: DC
  Administered 2020-06-03 – 2020-06-05 (×5): 3 mL via INTRAVENOUS

## 2020-06-02 MED ORDER — ACETAMINOPHEN 650 MG RE SUPP: 650.0000 mg | Freq: Four times a day (QID) | RECTAL | Status: DC | PRN

## 2020-06-02 MED ORDER — PANTOPRAZOLE SODIUM 40 MG IV SOLR
40.0000 mg | INTRAVENOUS | Status: DC
  Administered 2020-06-02: 40 mg via INTRAVENOUS
  Filled 2020-06-02: qty 40

## 2020-06-02 MED ORDER — ENSURE ENLIVE PO LIQD
237.0000 mL | Freq: Two times a day (BID) | ORAL | Status: DC
  Administered 2020-06-03 – 2020-06-05 (×5): 237 mL via ORAL

## 2020-06-02 MED ORDER — LACTATED RINGERS IV SOLN: INTRAVENOUS | Status: AC

## 2020-06-02 MED ORDER — SODIUM CHLORIDE 0.9 % IV SOLN
1.0000 g | Freq: Once | INTRAVENOUS | Status: DC
  Administered 2020-06-02: 1 g via INTRAVENOUS
  Filled 2020-06-02: qty 10

## 2020-06-02 MED ORDER — GABAPENTIN 100 MG PO CAPS
100.0000 mg | ORAL_CAPSULE | Freq: Every day | ORAL | Status: DC
  Administered 2020-06-02 – 2020-06-04 (×3): 100 mg via ORAL
  Filled 2020-06-02 (×3): qty 1

## 2020-06-02 MED ORDER — POLYETHYLENE GLYCOL 3350 17 G PO PACK: 17.0000 g | PACK | Freq: Every day | ORAL | Status: DC | PRN

## 2020-06-02 MED ORDER — ASPIRIN EC 81 MG PO TBEC
81.0000 mg | DELAYED_RELEASE_TABLET | Freq: Every day | ORAL | Status: DC
  Administered 2020-06-03 – 2020-06-05 (×3): 81 mg via ORAL
  Filled 2020-06-02 (×3): qty 1

## 2020-06-02 MED ORDER — SERTRALINE HCL 50 MG PO TABS
50.0000 mg | ORAL_TABLET | Freq: Every day | ORAL | Status: DC
Start: 2020-06-03 — End: 2020-06-05
  Administered 2020-06-03 – 2020-06-05 (×3): 50 mg via ORAL
  Filled 2020-06-02 (×3): qty 1

## 2020-06-02 MED ORDER — FLUTICASONE FUROATE-VILANTEROL 200-25 MCG/INH IN AEPB
1.0000 | INHALATION_SPRAY | Freq: Every day | RESPIRATORY_TRACT | Status: DC
  Administered 2020-06-03 – 2020-06-05 (×3): 1 via RESPIRATORY_TRACT
  Filled 2020-06-02: qty 28

## 2020-06-02 MED ORDER — ALBUTEROL SULFATE (2.5 MG/3ML) 0.083% IN NEBU: 2.5000 mg | INHALATION_SOLUTION | Freq: Four times a day (QID) | RESPIRATORY_TRACT | Status: DC | PRN

## 2020-06-02 MED ORDER — SODIUM CHLORIDE 0.9 % IV SOLN
500.0000 mg | INTRAVENOUS | Status: DC
  Administered 2020-06-02: 500 mg via INTRAVENOUS
  Filled 2020-06-02 (×2): qty 500

## 2020-06-02 MED ORDER — HEPARIN SODIUM (PORCINE) 5000 UNIT/ML IJ SOLN
5000.0000 [IU] | Freq: Three times a day (TID) | INTRAMUSCULAR | Status: DC
  Administered 2020-06-02 – 2020-06-05 (×9): 5000 [IU] via SUBCUTANEOUS
  Filled 2020-06-02 (×9): qty 1

## 2020-06-02 NOTE — ED Provider Notes (Signed)
  Face-to-face evaluation   History: She has been ill for about a week with decreased oral intake, including both solids and liquids.  In the last 2 days she has gotten even weaker despite starting antibiotic for pneumonia.  This was diagnosed when she visited her doctor at the office.  She is a somewhat poor historian but here with her daughter who helps to give history.  There has been no focal weakness or reported numbness.  She is not coughing persistently or complaining of shortness of breath.  Physical exam: Alert elderly female who is tachypneic, and somewhat uncomfortable.  Lungs with diminished air movement left base, no audible wheezes rales or rhonchi.  Medical screening examination/treatment/procedure(s) were conducted as a shared visit with non-physician practitioner(s) and myself.  I personally evaluated the patient during the encounter    Daleen Bo, MD 06/02/20 [phone]

## 2020-06-02 NOTE — H&P (Addendum)
History and Physical        Hospital Admission Note Date: 06/02/2020  Patient name: Wendy Velasquez Medical record number: [id number] Date of birth: Jan 03, 1926 Age: 87 y.o. Gender: female  PCP: Tisovec, Fransico Him, MD  Patient coming from: [facility]  At baseline, ambulates: wheelchair  Chief Complaint    Chief Complaint  Patient presents with  . Pneumonia      HPI:   This is a 87 year old female with past medical history of cardiac arrest and complete heart block and tachybradycardia syndrome s/p pacemaker, COPD, GERD, iron deficiency anemia, upper GI bleed, hypertension, COVID-19, hiatal hernia, hyperlipidemia, type 2 diabetes, esophageal stricture, recent diagnosis of pneumonia currently on Levaquin who presents to the ED with poor p.o. intake and generalized weakness for the past week.  Patient and daughter at bedside state that this past Sunday she had pretty significant acid reflux at night and this persisted into Monday as well.  She felt weak and went to her PCP on Tuesday and was prescribed Levaquin for pneumonia which was diagnosed on CXR (record not available).  She started Levaquin Tuesday evening and took her Wednesday dose as well as this a.m.  Despite starting antibiotics, she has had persistent generalized weakness and has no appetite.  Since she has not been feeling any better she came to the ED.  She admits to productive cough and occasional shortness of breath though not currently.  Additionally, she admits to left lower extremity swelling for the past week and is wheelchair-bound since a hip surgery in September.  Has no history of blood clots.  Patient had a low-grade fever the other day but has not had any fever since.  Denies chills, chest pain, nausea, vomiting, diarrhea, blood in stool or sputum or any other complaints.   ED Course: Afebrile, hemodynamically  stable, on room air. Notable Labs: Sodium 130, K3.8, glucose 130, BUN 39, creatinine 0.79, lactic acid 1.8, WBC 9.3, Hb 9.2 covered COVID-19 pending. Notable Imaging: CXR-retrocardiac collapse/consolidative opacity with small left pleural effusion, pneumonia is a concern. Patient received ceftriaxone.    Vitals:   06/02/20 1300 06/02/20 1330  BP: (!) 115/49 (!) 111/50  Pulse: 85 83  Resp: 20 (!) 22  Temp:    SpO2: 97% 97%     Review of Systems:  Review of Systems  All other systems reviewed and are negative.   Medical/Social/Family History   Past Medical History: Past Medical History:  Diagnosis Date  . Asthma   . Cardiac pacemaker in situ   . Complete heart block ([place])   . COPD (chronic obstructive pulmonary disease) ([place])   . Diverticulosis of colon   . GERD (gastroesophageal reflux disease)   . Glaucoma of both eyes   . Heart disease   . History of cardiac arrest    09-11-2012--  symptomatic complete heart block -- hr 20's--  cpr, intubated and temp. pacer until  perm. pacemaker placement  . History of esophageal dilatation    for stricture 2007  . History of hiatal hernia   . History of iron deficiency anemia    hx transfusion's 15 yrs ago , approx 2001  . Hyperlipidemia   . Hypertension   .  Neck pain   . Peripheral neuropathy   . Tachy-brady syndrome ([place])   . Type 2 diabetes mellitus ([place] )   . Urgency incontinence    Botox injections for bladder.  . Wears glasses     Past Surgical History:  Procedure Laterality Date  . ABDOMINAL HYSTERECTOMY  1976  approx   w/ unilateral salpingoophorectomy  . CATARACT EXTRACTION W/ INTRAOCULAR LENS  IMPLANT, BILATERAL  1990  . COLONOSCOPY WITH ESOPHAGOGASTRODUODENOSCOPY (EGD)  last one 2007  . INTERSTIM IMPLANT PLACEMENT  2012  . INTERSTIM IMPLANT REMOVAL N/A 09/21/2014   Procedure: REMOVAL OF INTERSTIM IMPLANT;  Surgeon: Bjorn Loser, MD;  Location: [facility];  Service: Urology;  Laterality: N/A;   . INTERSTIM IMPLANT REVISION N/A 09/21/2014   Procedure: Barrie [place]yme STAGE ONE AND TWO;  Surgeon: Bjorn Loser, MD;  Location: [facility];  Service: Urology;  Laterality: N/A;  . LAPAROTOMY W/ UNILATERAL SALPINGOOPHORECTOMY  1960's  approx  . PERMANENT PACEMAKER INSERTION N/A 09/11/2012   Procedure: PERMANENT PACEMAKER INSERTION;  Surgeon: Evans Lance, MD;  Location: [facility] CATH LAB;  Service: Cardiovascular;  Laterality: N/A;  St. Jude Dual Chamber  . RECTOCELE REPAIR  1991  approx    Medications: Prior to Admission medications   Medication Sig Start Date End Date Taking? Authorizing Provider  acetaminophen (TYLENOL) 325 MG tablet Take 2 tablets (650 mg total) by mouth every 6 (six) hours as needed. 12/16/19   Ngetich, Dinah C, NP  albuterol (PROVENTIL) (2.5 MG/3ML) 0.083% nebulizer solution Take 3 mLs (2.5 mg total) by nebulization every 6 (six) hours as needed for wheezing or shortness of breath. 12/16/19   Ngetich, Dinah C, NP  aspirin EC 81 MG tablet Take 81 mg by mouth daily.    [provider]  bisacodyl (DULCOLAX) 10 MG suppository Place 10 mg rectally as needed for moderate constipation.    [provider]  Brinzolamide-Brimonidine (SIMBRINZA) 1-0.2 % SUSP Apply 1 drop to eye at bedtime. 12/16/19   Ngetich, Dinah C, NP  cetirizine (ZYRTEC) 10 MG tablet Take 1 tablet (10 mg total) by mouth daily as needed for allergies. 12/16/19   Ngetich, Dinah C, NP  Esomeprazole Magnesium (NEXIUM PO) Take 1 tablet by mouth daily.    [provider]  fluticasone (FLONASE) 50 MCG/ACT nasal spray Place 2 sprays into both nostrils daily as needed for allergies. SHAKE AS DIRECTED 12/16/19   Ngetich, Dinah C, NP  Fluticasone-Salmeterol (ADVAIR DISKUS) 250-50 MCG/DOSE AEPB Inhale 1 puff into the lungs 2 (two) times daily. Inhale 1 puff into  lungs twice daily gargle and rinse your mouth with water after each use of this medication. 12/16/19   Ngetich, Dinah C, NP   gabapentin (NEURONTIN) 100 MG capsule Take 1 capsule (100 mg total) by mouth at bedtime. 12/16/19   Ngetich, Dinah C, NP  gabapentin (NEURONTIN) 100 MG capsule Take 1 capsule (100 mg total) by mouth in the morning and at bedtime. Give one by mouth in the morning and midday. 12/16/19   Ngetich, Dinah C, NP  ibandronate (BONIVA) 150 MG tablet Take 1 tablet (150 mg total) by mouth every 30 (thirty) days. Take in the morning with a full glass of water, on an empty stomach, and do not take anything else by mouth or lie down for the next 30 min. 12/16/19   Ngetich, Dinah C, NP  insulin glargine (LANTUS) 100 UNIT/ML injection Inject 0.12 mLs (12 Units total) into the skin at bedtime. 9 pm daily 12/16/19  Ngetich, Dinah C, NP  Ipratropium-Albuterol (COMBIVENT RESPIMAT) 20-100 MCG/ACT AERS respimat Inhale 1 puff into the lungs every 8 (eight) hours as needed for wheezing. 12/16/19   Ngetich, Dinah C, NP  lidocaine-prilocaine (EMLA) cream Apply 1 application topically 4 (four) times daily as needed. 12/16/19   Ngetich, Dinah C, NP  Magnesium Hydroxide (MILK OF MAGNESIA PO) Take 30 mLs by mouth as needed.    [provider]  Menthol, Topical Analgesic, (BIOFREEZE) 4 % GEL Apply 1 application topically at bedtime. Apply to Right Leg.    [provider]  metFORMIN (GLUCOPHAGE) 1000 MG tablet Take 1 tablet (1,000 mg total) by mouth 2 (two) times daily with a meal. 12/16/19   Ngetich, Dinah C, NP  MYRBETRIQ 25 MG TB24 tablet Take 1 tablet (25 mg total) by mouth daily. 12/16/19   Ngetich, Dinah C, NP  polyethylene glycol (MIRALAX / GLYCOLAX) 17 g packet Take 17 g by mouth daily. 11/24/19   Nita Sells, MD  senna-docusate (SENOKOT-S) 8.6-50 MG tablet Take 1 tablet by mouth daily. 12/16/19   Ngetich, Dinah C, NP  sertraline (ZOLOFT) 50 MG tablet Take 1 tablet (50 mg total) by mouth 2 (two) times daily. 12/16/19   Ngetich, Dinah C, NP  Sodium Phosphates (RA SALINE ENEMA RE) Place rectally as needed.     [provider]  trimethoprim (TRIMPEX) 100 MG tablet Take 1 tablet (100 mg total) by mouth daily. OK TO RESUME AFTER YOU FINISH CURRENT ANTIBIOTIC THERAPY. 12/16/19   Ngetich, Dinah C, NP  vitamin B-12 (CYANOCOBALAMIN) 1000 MCG tablet Take 1 tablet (1,000 mcg total) by mouth daily. 12/16/19   Ngetich, Dinah C, NP    Allergies:  No Known Allergies  Social History:  reports that she has never smoked. She has never used smokeless tobacco. She reports that she does not drink alcohol and does not use drugs.  Family History: Family History  Problem Relation Age of Onset  . Other Mother        died during her childbirth  . Prostatitis Father   . Heart failure Sister   . Breast cancer Sister   . Diabetic kidney disease Sister   . Melanoma Brother      Objective   Physical Exam: Blood pressure (!) 111/50, pulse 83, temperature 98.3 F (36.8 C), temperature source Oral, resp. rate (!) 22, height 5\' 5"  (1.651 m), weight 54.4 kg, SpO2 97 %.  Physical Exam Vitals and nursing note reviewed. Exam conducted with a chaperone present.  Constitutional:      General: She is not in acute distress.    Comments: Frail elderly female  HENT:     Head: Normocephalic.  Eyes:     Conjunctiva/sclera: Conjunctivae normal.  Cardiovascular:     Rate and Rhythm: Normal rate and regular rhythm.     Heart sounds: Murmur heard.    Pulmonary:     Effort: Pulmonary effort is normal.     Breath sounds: Normal breath sounds.     Comments: Bibasilar rales Abdominal:     General: Abdomen is flat. There is no distension.     Tenderness: There is no abdominal tenderness.  Musculoskeletal:     Comments: Left lower extremity trace edema with mild erythema over the ankle joint without tenderness to palpation  Skin:    Coloration: Skin is not pale.     Findings: No rash.  Neurological:     Mental Status: She is alert. Mental status is at baseline.  Psychiatric:  Mood and Affect: Mood normal.         Behavior: Behavior normal.        LABS on Admission: I have personally reviewed all the labs and imaging below    Basic Metabolic Panel: Recent Labs  Lab 06/02/20 1221  NA 138  K 3.8  CL 105  CO2 24  GLUCOSE 130*  BUN 39*  CREATININE 0.79  CALCIUM 9.0   Liver Function Tests: Recent Labs  Lab 06/02/20 1221  AST 35  ALT 40  ALKPHOS 46  BILITOT 0.6  PROT 5.4*  ALBUMIN 3.1*   No results for input(s): LIPASE, AMYLASE in the last 168 hours. No results for input(s): AMMONIA in the last 168 hours. CBC: Recent Labs  Lab 06/02/20 1221  WBC 9.3  NEUTROABS 7.7  HGB 9.2*  HCT 29.8*  MCV 89.8  PLT 243   Cardiac Enzymes: No results for input(s): CKTOTAL, CKMB, CKMBINDEX, TROPONINI in the last 168 hours. BNP: Invalid input(s): POCBNP CBG: No results for input(s): GLUCAP in the last 168 hours.  Radiological Exams on Admission:  DG Chest 2 View  Result Date: 06/02/2020 CLINICAL DATA:  Weakness and shortness of breath. EXAM: CHEST - 2 VIEW COMPARISON:  Chest x-ray 02/24/2015.  Abdomen/pelvis CT 03/26/2013. FINDINGS: Cardiopericardial silhouette is at upper limits of normal for size. Large right-sided hiatal hernia noted, as better demonstrated on prior abdomen/pelvis CT. Interstitial markings are diffusely coarsened with chronic features. Retrocardiac collapse/consolidative opacity is associated with small pleural effusion. The visualized bony structures of the thorax show no acute abnormality. Telemetry leads overlie the chest. IMPRESSION: 1. Retrocardiac collapse/consolidative opacity with small left pleural effusion. Pneumonia is a concern. 2. Large right-sided hiatal hernia. 3. Diffuse chronic interstitial changes. Electronically Signed   By: Misty Stanley M.D.   On: 06/02/2020 12:32      EKG: Not done   A & P   Principal Problem:   Community acquired pneumonia Active Problems:   Complete heart block (HCC)   Essential hypertension   COPD (chronic  obstructive pulmonary disease) (HCC)   GERD (gastroesophageal reflux disease)   S/P cardiac pacemaker procedure   DM2 (diabetes mellitus, type 2) (HCC)   Esophageal reflux   Failure to thrive in adult   1. Failure to thrive, suspect secondary to CAP vs aspiration pneumonia a. Afebrile and hemodynamically stable on room air without leukocytosis but has had 2 days of Levaquin b. CXR concerning for pneumonia, possibly aspiration given her history of acid reflux on Sunday and Monday and history of esophageal stricture c. SLP eval d. Dietitian consult e. Ceftriaxone and azithromycin for CAP and aspiration pneumonia.  Broaden to cefepime pending sputum cultures f. PT eval  2. Left lower extremity edema a. Wheelchair-bound b. LLE [place] to rule out DVT i. Addendum: Negative for DVT however sonographer noted a complex focal collection in the mid calf   3. COPD, not in acute exacerbation a. Continue home inhalers  4. GERD with history of esophageal stricture a. IV Protonix daily for now since she has poor p.o. intake to limit pill burden  5. Normocytic anemia with history of iron deficiency anemia a. At baseline  6. Type 2 diabetes a. Reduce Lantus to 10 units for now until tolerating meals  7. Hypertension and hyperlipidemia a. Not on meds at home  8. Depression a. Continue Zoloft  9. Incidental COVID 19 + a. Cycle threshold 42, unlikely infectious and likely getting over a recent infection b. Discussed with Dr. Baxter Flattery, ID,  who recommends airborne precautions and checking inflammatory markers but holding off on treatment at this time  10. History of complete heart block and tachybradycardia syndrome s/p pacemaker     DVT prophylaxis: Heparin   Code Status: DNR  Diet: N.p.o. except for sips with meds pending SLP eval Family Communication: Admission, patients condition and plan of care including tests being ordered have been discussed with the patient who indicates understanding  and agrees with the plan and Code Status. Patient's daughter was updated  Disposition Plan: The appropriate patient status for this patient is INPATIENT. Inpatient status is judged to be reasonable and necessary in order to provide the required intensity of service to ensure the patient's safety. The patient's presenting symptoms, physical exam findings, and initial radiographic and laboratory data in the context of their chronic comorbidities is felt to place them at high risk for further clinical deterioration. Furthermore, it is not anticipated that the patient will be medically stable for discharge from the hospital within 2 midnights of admission. The following factors support the patient status of inpatient.   " The patient's presenting symptoms include failure to thrive, cough. " The worrisome physical exam findings include bibasilar rales, left lower extremity swelling. " The initial radiographic and laboratory data are worrisome because of concerning for pneumonia. " The chronic co-morbidities include COPD.   * I certify that at the point of admission it is my clinical judgment that the patient will require inpatient hospital care spanning beyond 2 midnights from the point of admission due to high intensity of service, high risk for further deterioration and high frequency of surveillance required.*   Consultants  . None  Procedures  . None  Time Spent on Admission: 66 minutes    Harold Hedge, DO [facility]  06/02/2020, 2:46 PM

## 2020-06-02 NOTE — ED Triage Notes (Signed)
Coming from [facility], recent diagnosis of pneumonia, currently on levaquin

## 2020-06-02 NOTE — Progress Notes (Signed)
Lower extremity venous LT study completed.  Preliminary results relayed to Neysa Bonito, MD.   See CV Proc for preliminary results report.   Darlin Coco, RDMS, RVT

## 2020-06-02 NOTE — ED Notes (Signed)
Daughter to bedside at this time, will continue to monitor

## 2020-06-02 NOTE — ED Provider Notes (Signed)
[facility] DEPT Provider Note   CSN: [id number] Arrival date & time: 06/02/20  1102     History Chief Complaint  Patient presents with  . Pneumonia    Wendy Velasquez is a 87 y.o. female presenting for evaluation of generalized weakness.   Pt states for the past week she has been feeling ill. She has been weak, to the point where she cannot get out of bed. She saw her PCP and was dx with PNA a few days ago, started on levaquin. She has continued to worsen despite this. denies known fever, chills, cp, cough, sob, n/v abd pain. She was in the hospital 09/20201 due to a fall resulting in pubic rami fx. Since then she has decreased ability to ambulate.   Additional history obtained from chart review.  Patient with a history of complete heart block with pacemaker, COPD, GERD, hypertension, hyperlipidemia, diabetes  HPI     Past Medical History:  Diagnosis Date  . Asthma   . Cardiac pacemaker in situ   . Complete heart block ([place])   . COPD (chronic obstructive pulmonary disease) ([place])   . Diverticulosis of colon   . GERD (gastroesophageal reflux disease)   . Glaucoma of both eyes   . Heart disease   . History of cardiac arrest    09-11-2012--  symptomatic complete heart block -- hr 20's--  cpr, intubated and temp. pacer until  perm. pacemaker placement  . History of esophageal dilatation    for stricture 2007  . History of hiatal hernia   . History of iron deficiency anemia    hx transfusion's 15 yrs ago , approx 2001  . Hyperlipidemia   . Hypertension   . Neck pain   . Peripheral neuropathy   . Tachy-brady syndrome ([place])   . Type 2 diabetes mellitus ([place])   . Urgency incontinence    Botox injections for bladder.  . Wears glasses     Patient Active Problem List   Diagnosis Date Noted  . Cervicalgia 03/23/2020  . Urgency incontinence   . Pressure injury of skin 11/23/2019  . Hip fracture ([place]) 11/22/2019  . Acute hypoxemic respiratory  failure due to COVID-19 ([place]) 04/23/2019  . Neck pain 04/08/2018  . Cervical dystonia 01/02/2018  . Chronic neck pain 12/05/2017  . Sepsis ([place])   . Type 2 diabetes mellitus with hyperglycemia, with long-term current use of insulin ([place])   . Esophageal reflux   . COPD exacerbation ([place])   . Depression   . Physical deconditioning   . Severe protein-calorie malnutrition ([place])   . DM2 (diabetes mellitus, type 2) ([place]) 02/25/2015  . Anorexia 10/01/2014  . S/P cardiac pacemaker procedure 09/13/2012  . Complete heart block ([place]) 09/11/2012  . Cardiac arrest - due to complete Heart Block, resolved 09/11/2012  . Essential hypertension 09/11/2012  . Hyperlipidemia - on staint 09/11/2012  . COPD (chronic obstructive pulmonary disease) ([place]) 09/11/2012  . Osteoporosis 09/11/2012  . GERD (gastroesophageal reflux disease) 09/11/2012  . Acute respiratory failure - resolved, extubated 11PM 7/3 09/11/2012  . Cardiogenic shock - resolved 09/11/2012    Past Surgical History:  Procedure Laterality Date  . ABDOMINAL HYSTERECTOMY  1976  approx   w/ unilateral salpingoophorectomy  . CATARACT EXTRACTION W/ INTRAOCULAR LENS  IMPLANT, BILATERAL  1990  . COLONOSCOPY WITH ESOPHAGOGASTRODUODENOSCOPY (EGD)  last one 2007  . INTERSTIM IMPLANT PLACEMENT  2012  . INTERSTIM IMPLANT REMOVAL N/A 09/21/2014   Procedure: REMOVAL OF INTERSTIM IMPLANT;  Surgeon: Bjorn Loser, MD;  Location[facility];  Service: Urology;  Laterality: N/A;  . INTERSTIM IMPLANT REVISION N/A 09/21/2014   Procedure: Barrie Lyme STAGE ONE AND TWO;  Surgeon: Bjorn Loser, MD;  Location: [facility];  Service: Urology;  Laterality: N/A;  . LAPAROTOMY W/ UNILATERAL SALPINGOOPHORECTOMY  1960's  approx  . PERMANENT PACEMAKER INSERTION N/A 09/11/2012   Procedure: PERMANENT PACEMAKER INSERTION;  Surgeon: Evans Lance, MD;  Location: [facility] CATH LAB;  Service: Cardiovascular;  Laterality: N/A;  St. Jude Dual Chamber   . [place]  approx     OB History   No obstetric history on file.     Family History  Problem Relation Age of Onset  . Other Mother        died during her childbirth  . Prostatitis Father   . Heart failure Sister   . Breast cancer Sister   . Diabetic kidney disease Sister   . Melanoma Brother     Social History   Tobacco Use  . Smoking status: Never Smoker  . Smokeless tobacco: Never Used  Vaping Use  . Vaping Use: Never used  Substance Use Topics  . Alcohol use: No  . Drug use: No    Home Medications Prior to Admission medications   Medication Sig Start Date End Date Taking? Authorizing Provider  acetaminophen (TYLENOL) 325 MG tablet Take 2 tablets (650 mg total) by mouth every 6 (six) hours as needed. 12/16/19   Ngetich, Dinah C, NP  albuterol (PROVENTIL) (2.5 MG/3ML) 0.083% nebulizer solution Take 3 mLs (2.5 mg total) by nebulization every 6 (six) hours as needed for wheezing or shortness of breath. 12/16/19   Ngetich, Dinah C, NP  aspirin EC 81 MG tablet Take 81 mg by mouth daily.    [provider]  bisacodyl (DULCOLAX) 10 MG suppository Place 10 mg rectally as needed for moderate constipation.    [provider]  Brinzolamide-Brimonidine (SIMBRINZA) 1-0.2 % SUSP Apply 1 drop to eye at bedtime. 12/16/19   Ngetich, Dinah C, NP  cetirizine (ZYRTEC) 10 MG tablet Take 1 tablet (10 mg total) by mouth daily as needed for allergies. 12/16/19   Ngetich, Dinah C, NP  Esomeprazole Magnesium (NEXIUM PO) Take 1 tablet by mouth daily.    [provider]  fluticasone (FLONASE) 50 MCG/ACT nasal spray Place 2 sprays into both nostrils daily as needed for allergies. SHAKE AS DIRECTED 12/16/19   Ngetich, Dinah C, NP  Fluticasone-Salmeterol (ADVAIR DISKUS) 250-50 MCG/DOSE AEPB Inhale 1 puff into the lungs 2 (two) times daily. Inhale 1 puff into  lungs twice daily gargle and rinse your mouth with water after each use of this medication. 12/16/19    Ngetich, Dinah C, NP  gabapentin (NEURONTIN) 100 MG capsule Take 1 capsule (100 mg total) by mouth at bedtime. 12/16/19   Ngetich, Dinah C, NP  gabapentin (NEURONTIN) 100 MG capsule Take 1 capsule (100 mg total) by mouth in the morning and at bedtime. Give one by mouth in the morning and midday. 12/16/19   Ngetich, Dinah C, NP  ibandronate (BONIVA) 150 MG tablet Take 1 tablet (150 mg total) by mouth every 30 (thirty) days. Take in the morning with a full glass of water, on an empty stomach, and do not take anything else by mouth or lie down for the next 30 min. 12/16/19   Ngetich, Dinah C, NP  insulin glargine (LANTUS) 100 UNIT/ML injection Inject 0.12 mLs (12 Units total)  into the skin at bedtime. 9 pm daily 12/16/19   Ngetich, Dinah C, NP  Ipratropium-Albuterol (COMBIVENT RESPIMAT) 20-100 MCG/ACT AERS respimat Inhale 1 puff into the lungs every 8 (eight) hours as needed for wheezing. 12/16/19   Ngetich, Dinah C, NP  lidocaine-prilocaine (EMLA) cream Apply 1 application topically 4 (four) times daily as needed. 12/16/19   Ngetich, Dinah C, NP  Magnesium Hydroxide (MILK OF MAGNESIA PO) Take 30 mLs by mouth as needed.    [provider]  Menthol, Topical Analgesic, (BIOFREEZE) 4 % GEL Apply 1 application topically at bedtime. Apply to Right Leg.    [provider]  metFORMIN (GLUCOPHAGE) 1000 MG tablet Take 1 tablet (1,000 mg total) by mouth 2 (two) times daily with a meal. 12/16/19   Ngetich, Dinah C, NP  MYRBETRIQ 25 MG TB24 tablet Take 1 tablet (25 mg total) by mouth daily. 12/16/19   Ngetich, Dinah C, NP  polyethylene glycol (MIRALAX / GLYCOLAX) 17 g packet Take 17 g by mouth daily. 11/24/19   Nita Sells, MD  senna-docusate (SENOKOT-S) 8.6-50 MG tablet Take 1 tablet by mouth daily. 12/16/19   Ngetich, Dinah C, NP  sertraline (ZOLOFT) 50 MG tablet Take 1 tablet (50 mg total) by mouth 2 (two) times daily. 12/16/19   Ngetich, Dinah C, NP  Sodium Phosphates (RA SALINE ENEMA RE) Place  rectally as needed.    [provider]  trimethoprim (TRIMPEX) 100 MG tablet Take 1 tablet (100 mg total) by mouth daily. OK TO RESUME AFTER YOU FINISH CURRENT ANTIBIOTIC THERAPY. 12/16/19   Ngetich, Dinah C, NP  vitamin B-12 (CYANOCOBALAMIN) 1000 MCG tablet Take 1 tablet (1,000 mcg total) by mouth daily. 12/16/19   Ngetich, Nelda Bucks, NP    Allergies    Patient has no known allergies.  Review of Systems   Review of Systems  Constitutional: Positive for appetite change.  Neurological: Positive for weakness.  All other systems reviewed and are negative.   Physical Exam Updated Vital Signs BP (!) 115/52   Pulse 83   Temp 98.3 F (36.8 C) (Oral)   Resp 20   Ht 5\' 5"  (1.651 m)   Wt 54.4 kg   SpO2 97%   BMI 19.97 kg/m   Physical Exam Vitals and nursing note reviewed.  Constitutional:      General: She is not in acute distress.    Appearance: She is underweight.     Comments: Appears frail  HENT:     Head: Normocephalic and atraumatic.  Eyes:     Extraocular Movements: Extraocular movements intact.     Conjunctiva/sclera: Conjunctivae normal.     Pupils: Pupils are equal, round, and reactive to light.  Cardiovascular:     Rate and Rhythm: Normal rate and regular rhythm.     Pulses: Normal pulses.  Pulmonary:     Effort: Pulmonary effort is normal. No respiratory distress.     Breath sounds: Normal breath sounds. No wheezing.     Comments: Clear lung sounds Abdominal:     General: There is no distension.     Palpations: Abdomen is soft. There is no mass.     Tenderness: There is no abdominal tenderness. There is no guarding or rebound.  Musculoskeletal:        General: Normal range of motion.     Cervical back: Normal range of motion and neck supple.  Skin:    General: Skin is warm and dry.     Capillary Refill: Capillary refill takes  less than 2 seconds.  Neurological:     Mental Status: She is alert and oriented to person, place, and time.     ED Results  / Procedures / Treatments   Labs (all labs ordered are listed, but only abnormal results are displayed) Labs Reviewed  CBC WITH DIFFERENTIAL/PLATELET - Abnormal; Notable for the following components:      Result Value   RBC 3.32 (*)    Hemoglobin 9.2 (*)    HCT 29.8 (*)    RDW 15.9 (*)    All other components within normal limits  COMPREHENSIVE METABOLIC PANEL - Abnormal; Notable for the following components:   Glucose, Bld 130 (*)    BUN 39 (*)    Total Protein 5.4 (*)    Albumin 3.1 (*)    All other components within normal limits  CULTURE, BLOOD (ROUTINE X 2)  CULTURE, BLOOD (ROUTINE X 2)  RESP PANEL BY RT-PCR (FLU A&B, COVID) ARPGX2  LACTIC ACID, PLASMA  LACTIC ACID, PLASMA    EKG None  Radiology DG Chest 2 View  Result Date: 06/02/2020 CLINICAL DATA:  Weakness and shortness of breath. EXAM: CHEST - 2 VIEW COMPARISON:  Chest x-ray 02/24/2015.  Abdomen/pelvis CT 03/26/2013. FINDINGS: Cardiopericardial silhouette is at upper limits of normal for size. Large right-sided hiatal hernia noted, as better demonstrated on prior abdomen/pelvis CT. Interstitial markings are diffusely coarsened with chronic features. Retrocardiac collapse/consolidative opacity is associated with small pleural effusion. The visualized bony structures of the thorax show no acute abnormality. Telemetry leads overlie the chest. IMPRESSION: 1. Retrocardiac collapse/consolidative opacity with small left pleural effusion. Pneumonia is a concern. 2. Large right-sided hiatal hernia. 3. Diffuse chronic interstitial changes. Electronically Signed   By: Misty Stanley M.D.   On: 06/02/2020 12:32    Procedures Procedures   Medications Ordered in ED Medications - No data to display  ED Course  I have reviewed the triage vital signs and the nursing notes.  Pertinent labs & imaging results that were available during my care of the patient were reviewed by me and considered in my medical decision making (see chart  for details).    MDM Rules/Calculators/A&P                          Patient presented for evaluation of worsening weakness despite antibiotics.  She is diagnosed with pneumonia several days ago, started on Levaquin but is not improving.  On exam, patient appears frail.  No obvious respiratory distress.  Clear lung sounds.  Sats stable on room air.  Will obtain labs, repeat chest x-ray as the PCP x-ray is not available for review in the EMR.  Chest x-ray viewed interpreted by me, does show a left lower lobe pneumonia with associated pleural effusion.  Labs overall reassuring.  No leukocytosis.  Lactic negative.  Hemoglobin at baseline.  Patient does have slightly elevated BUN and low hematocrit.  Her curb 65 and PORT score both place pt at high risk, and recommend admission. Pt is not improving on OP abx, and would benefit from closer obvs/admission.   Discussed with Dr. Neysa Bonito from triad hospitalist service, pt to be admitted.   Final Clinical Impression(s) / ED Diagnoses Final diagnoses:  Community acquired pneumonia of left lower lobe of lung  Pleural effusion  Weakness    Rx / DC Orders ED Discharge Orders    None       Franchot Heidelberg, PA-C 06/02/20 1401  Daleen Bo, MD 06/02/20 [phone]

## 2020-06-03 ENCOUNTER — Other Ambulatory Visit: Payer: Self-pay

## 2020-06-03 DIAGNOSIS — R627 Adult failure to thrive: Secondary | ICD-10-CM

## 2020-06-03 DIAGNOSIS — K219 Gastro-esophageal reflux disease without esophagitis: Secondary | ICD-10-CM

## 2020-06-03 DIAGNOSIS — J449 Chronic obstructive pulmonary disease, unspecified: Secondary | ICD-10-CM

## 2020-06-03 DIAGNOSIS — Z950 Presence of cardiac pacemaker: Secondary | ICD-10-CM

## 2020-06-03 DIAGNOSIS — I442 Atrioventricular block, complete: Secondary | ICD-10-CM

## 2020-06-03 DIAGNOSIS — J189 Pneumonia, unspecified organism: Secondary | ICD-10-CM

## 2020-06-03 DIAGNOSIS — E1142 Type 2 diabetes mellitus with diabetic polyneuropathy: Secondary | ICD-10-CM | POA: Diagnosis not present

## 2020-06-03 DIAGNOSIS — I10 Essential (primary) hypertension: Secondary | ICD-10-CM

## 2020-06-03 LAB — BASIC METABOLIC PANEL
Anion gap: 8 (ref 5–15)
BUN: 27 mg/dL — ABNORMAL HIGH (ref 8–23)
CO2: 25 mmol/L (ref 22–32)
Calcium: 9.1 mg/dL (ref 8.9–10.3)
Chloride: 106 mmol/L (ref 98–111)
Creatinine, Ser: 0.74 mg/dL (ref 0.44–1.00)
GFR, Estimated: >60 mL/min (ref >60)
Glucose, Bld: 246 mg/dL — ABNORMAL HIGH (ref 70–99)
Potassium: 4.6 mmol/L (ref 3.5–5.1)
Sodium: 139 mmol/L (ref 135–145)

## 2020-06-03 LAB — FERRITIN: Ferritin: 28 ng/mL (ref 11–307)

## 2020-06-03 LAB — D-DIMER, QUANTITATIVE: D-Dimer, Quant: 1.34 ug/mL-FEU — ABNORMAL HIGH (ref 0.00–0.50)

## 2020-06-03 LAB — CBC
HCT: 31.1 % — ABNORMAL LOW (ref 36.0–46.0)
Hemoglobin: 9.6 g/dL — ABNORMAL LOW (ref 12.0–15.0)
MCH: 27.9 pg (ref 26.0–34.0)
MCHC: 30.9 g/dL (ref 30.0–36.0)
MCV: 90.4 fL (ref 80.0–100.0)
Platelets: 266 10*3/uL (ref 150–400)
RBC: 3.44 MIL/uL — ABNORMAL LOW (ref 3.87–5.11)
RDW: 15.7 % — ABNORMAL HIGH (ref 11.5–15.5)
WBC: 8.3 10*3/uL (ref 4.0–10.5)
nRBC: 0 % (ref 0.0–0.2)

## 2020-06-03 LAB — C-REACTIVE PROTEIN: CRP: 5.3 mg/dL — ABNORMAL HIGH (ref <1.0)

## 2020-06-03 MED ORDER — ADULT MULTIVITAMIN W/MINERALS CH
1.0000 | ORAL_TABLET | Freq: Every day | ORAL | Status: DC
  Administered 2020-06-03 – 2020-06-05 (×3): 1 via ORAL
  Filled 2020-06-03 (×3): qty 1

## 2020-06-03 MED ORDER — PANTOPRAZOLE SODIUM 40 MG PO TBEC
40.0000 mg | DELAYED_RELEASE_TABLET | Freq: Every day | ORAL | Status: DC
  Administered 2020-06-03 – 2020-06-04 (×2): 40 mg via ORAL
  Filled 2020-06-03 (×2): qty 1

## 2020-06-03 MED ORDER — AZITHROMYCIN 250 MG PO TABS
500.0000 mg | ORAL_TABLET | Freq: Every day | ORAL | Status: DC
  Administered 2020-06-03 – 2020-06-04 (×2): 500 mg via ORAL
  Filled 2020-06-03 (×2): qty 2

## 2020-06-03 MED ORDER — FAMOTIDINE 20 MG PO TABS
20.0000 mg | ORAL_TABLET | Freq: Every day | ORAL | Status: DC | PRN
  Administered 2020-06-04 – 2020-06-05 (×2): 20 mg via ORAL
  Filled 2020-06-03 (×2): qty 1

## 2020-06-03 NOTE — Progress Notes (Signed)
Pharmacy: IV to PO  Protonix & azithromycin IV> PO per P&T protocol  Eudelia Bunch, Pharm.D 06/03/2020 12:17 PM

## 2020-06-03 NOTE — TOC Initial Note (Addendum)
Transition of Care [facility]) - Initial/Assessment Note    Patient Details  Name: Wendy Velasquez MRN: [id number] Date of Birth: 1925-12-25  Transition of Care [facility]) CM/SW Contact:    Wendy Mage, LCSW Phone Number: 06/03/2020, 10:19 AM  Clinical Narrative:   Patient seen in follow up to PT recommendation of SNF.  Wendy Velasquez is a resident at [facility] with her husband.  Spoke to [name] who agrees with recommendation-she asked that I inquire if [facility] has openings for COVID positive patients based on previous positive experience.  I did.  They don't.  Explained to daughter options are limited, and she voiced understanding.  Bed search initiated. TOC will continue to follow during the course of hospitalization.  Addendum:  Spoke with [name] to update re: [facility], acceptance by [facility]. Star with Wendy Velasquez confirmed ability to admit this weekend if discharged.  Appreciative of update.  They continue to hope she can go to [facility] if she remains in the hospital long enough to come off of isolation precautions.  Will follow up on Monday with MD if patient is still here.                 Expected Discharge Plan: Skilled Nursing Facility Barriers to Discharge: SNF Pending bed offer   Patient Goals and CMS Choice     Choice offered to / list presented to : Adult Children  Expected Discharge Plan and Services Expected Discharge Plan: [place]   Discharge Planning Services: CM Consult Post Acute Care Choice: [place] Living arrangements for the past 2 months: [place]                                      Prior Living Arrangements/Services Living arrangements for the past 2 months: [place] Lives with:: Facility Resident Patient language and need for interpreter reviewed:: Yes        Need for Family Participation in Patient Care: Yes (Comment) Care giver support system in place?:  Yes (comment)   Criminal Activity/Legal Involvement Pertinent to Current Situation/Hospitalization: No - Comment as needed  Activities of Daily Living Home Assistive Devices/Equipment: Wheelchair,Raised toilet seat with rails,Eyeglasses (feeding utensils) ADL Screening (condition at time of admission) Patient's cognitive ability adequate to safely complete daily activities?: Yes Is the patient deaf or have difficulty hearing?: Yes Does the patient have difficulty seeing, even when wearing glasses/contacts?: No Does the patient have difficulty concentrating, remembering, or making decisions?: No Patient able to express need for assistance with ADLs?: Yes Does the patient have difficulty dressing or bathing?: Yes Independently performs ADLs?: No Communication: Independent Dressing (OT): Needs assistance Is this a change from baseline?: Pre-admission baseline Grooming: Independent Feeding: Needs assistance (hands tremble, uses special utensils) Is this a change from baseline?: Pre-admission baseline Bathing: Needs assistance Is this a change from baseline?: Pre-admission baseline Toileting: Needs assistance Is this a change from baseline?: Pre-admission baseline In/Out Bed: Needs assistance Is this a change from baseline?: Pre-admission baseline Walks in Home: Dependent Is this a change from baseline?: Pre-admission baseline Does the patient have difficulty walking or climbing stairs?: Yes Weakness of Legs: Both Weakness of Arms/Hands: Both  Permission Sought/Granted Permission sought to share information with : Family Supports Permission granted to share information with : Yes, Verbal Permission Granted  Share Information with NAME: Wendy Velasquez (Daughter)   [phone] [facility]  Emotional Assessment Appearance:: Appears stated age Attitude/Demeanor/Rapport: Engaged Affect (typically observed): Appropriate Orientation: : Oriented to Self,Oriented to Place Alcohol  / Substance Use: Not Applicable Psych Involvement: No (comment)  Admission diagnosis:  Weakness [R53.1] Pleural effusion [J90] Community acquired pneumonia [J18.9] Mass of leg, left [R22.42] Community acquired pneumonia of left lower lobe of lung [J18.9] Patient Active Problem List   Diagnosis Date Noted  . Community acquired pneumonia 06/02/2020  . Failure to thrive in adult 06/02/2020  . Cervicalgia 03/23/2020  . Urgency incontinence   . Pressure injury of skin 11/23/2019  . Hip fracture ([place]) 11/22/2019  . Acute hypoxemic respiratory failure due to COVID-19 ([place]) 04/23/2019  . Neck pain 04/08/2018  . Cervical dystonia 01/02/2018  . Chronic neck pain 12/05/2017  . Sepsis ([place])   . Type 2 diabetes mellitus with hyperglycemia, with long-term current use of insulin ([place])   . Esophageal reflux   . COPD exacerbation ([place])   . Depression   . Physical deconditioning   . Severe protein-calorie malnutrition ([place])   . DM2 (diabetes mellitus, type 2) ([place]) 02/25/2015  . Anorexia 10/01/2014  . S/P cardiac pacemaker procedure 09/13/2012  . Complete heart block ([place]) 09/11/2012  . Cardiac arrest - due to complete Heart Block, resolved 09/11/2012  . Essential hypertension 09/11/2012  . Hyperlipidemia - on staint 09/11/2012  . COPD (chronic obstructive pulmonary disease) ([place]) 09/11/2012  . Osteoporosis 09/11/2012  . GERD (gastroesophageal reflux disease) 09/11/2012  . Acute respiratory failure - resolved, extubated 11PM 7/3 09/11/2012  . Cardiogenic shock - resolved 09/11/2012   PCP:  Tisovec, Fransico Him, MD Pharmacy:   [facility] - [address], [place] [address] Phone: [phone] Fax: [place], [place] [facility] [address] [facility] [place] [address] Phone: [phone] Fax: [phone]     Social Determinants of  Health (SDOH) Interventions    Readmission Risk Interventions No flowsheet data found.

## 2020-06-03 NOTE — Progress Notes (Signed)
PROGRESS NOTE    CECI TALIAFERRO  TIW:[id number] DOB: 04-28-1925 DOA: 06/02/2020 PCP: [name], MD   Brief Narrative:  This is a 87 year old female with past medical history of cardiac arrest and complete heart block and tachybradycardia syndrome s/p pacemaker, COPD, GERD, iron deficiency anemia, upper GI bleed, hypertension, COVID-19, hiatal hernia, hyperlipidemia, type 2 diabetes, esophageal stricture, recent diagnosis of pneumonia currently on Levaquin who presents to the ED with poor p.o. intake and generalized weakness for the past week.  Patient and daughter at bedside state that this past Sunday she had pretty significant acid reflux at night and this persisted into Monday as well.  She felt weak and went to her PCP on Tuesday and was prescribed Levaquin for pneumonia which was diagnosed on CXR (record not available).  She started Levaquin Tuesday evening and took her Wednesday dose as well as this a.m.  Despite starting antibiotics, she has had persistent generalized weakness and has no appetite.  Since she has not been feeling any better she came to the ED.  She admits to productive cough and occasional shortness of breath though not currently.  Additionally, she admits to left lower extremity swelling for the past week and is wheelchair-bound since a hip surgery in September.  Has no history of blood clots.  Patient had a low-grade fever the other day but has not had any fever since.  Denies chills, chest pain, nausea, vomiting, diarrhea, blood in stool or sputum or any other complaints. CXR-retrocardiac collapse/consolidative opacity with small left pleural effusion, pneumonia is likely.  Assessment & Plan:   Principal Problem:   Community acquired pneumonia Active Problems:   Complete heart block ([place])   Essential hypertension   COPD (chronic obstructive pulmonary disease) (HCC)   GERD (gastroesophageal reflux disease)   S/P cardiac pacemaker procedure   DM2 (diabetes  mellitus, type 2) (HCC)   Esophageal reflux   Failure to thrive in adult   Acute respiratory failure without hypoxia; suspect secondary to CAP vs aspiration pneumonia Unlikely acute covid infection given cycle count, inflammatory markers, and imaging. Afebrile and hemodynamically stable on room air without leukocytosis but has had 2 days of Levaquin CXR concerning for pneumonia, possibly aspiration given her history of acid reflux on Sunday and Monday and history of esophageal stricture SLP eval without overt aspiration - likely reflux related Dietitian consult Cefepime and azithromycin for CAP/aspiration pneumonia.   Follow cultures  Left lower extremity edema Wheelchair-bound LLE [place] negative for DVT  COPD, not in acute exacerbation Continue home inhalers  GERD with history of esophageal stricture IV Protonix daily for now since she has poor p.o. intake to limit pill burden  Normocytic anemia with history of iron deficiency anemia At baseline  Type 2 diabetes Reduce Lantus to 10 units for now until tolerating meals  Hypertension and hyperlipidemia Not on meds at home  Depression Continue Zoloft  Incidental COVID 19 + - Cycle threshold 42, unlikely infectious and likely getting over a recent infection - Discussed with Dr. Baxter Flattery, ID, who recommends airborne precautions and checking inflammatory markers but holding off on treatment at this time  History of complete heart block and tachybradycardia syndrome s/p pacemaker - Stable  DVT prophylaxis: Heparin Code Status: DNR Family Communication: Daughter at bedside  Status is: Inpatient  Dispo: The patient is from: Assisted living facility              Anticipated d/c is to: SNF  Anticipated d/c date is: 24 to 48 hours pending clinical course              Patient currently not medically stable for discharge  Consultants:   None  Procedures:   None  Antimicrobials:  Cefepime,  azithromycin  Subjective: No acute issues or events overnight  Objective: Vitals:   06/02/20 1656 06/02/20 2025 06/03/20 0124 06/03/20 0411  BP: (!) 115/52 (!) 118/53 119/68 126/60  Pulse: 88 89 79 78  Resp: 18 20 17 20   Temp: 99.1 F (37.3 C) 98.1 F (36.7 C) 97.8 F (36.6 C) 98.5 F (36.9 C)  TempSrc:      SpO2: 99% 95% 100% 99%  Weight:      Height:        Intake/Output Summary (Last 24 hours) at 06/03/2020 0806 Last data filed at 06/02/2020 2300 Gross per 24 hour  Intake 540.1 ml  Output --  Net 540.1 ml   Filed Weights   06/02/20 1116  Weight: 54.4 kg    Examination:  General exam: Appears calm and comfortable  Respiratory system: Clear to auscultation. Respiratory effort normal. Cardiovascular system: S1 & S2 heard, RRR. No JVD, murmurs, rubs, gallops or clicks. No pedal edema. Gastrointestinal system: Abdomen is nondistended, soft and nontender. No organomegaly or masses felt. Normal bowel sounds heard. Central nervous system: Alert and oriented. No focal neurological deficits. Extremities: Symmetric 5 x 5 power. Skin: No rashes, lesions or ulcers Psychiatry: Judgement and insight appear normal. Mood & affect appropriate.     Data Reviewed: I have personally reviewed following labs and imaging studies  CBC: Recent Labs  Lab 06/02/20 1221 06/03/20 0422  WBC 9.3 8.3  NEUTROABS 7.7  --   HGB 9.2* 9.6*  HCT 29.8* 31.1*  MCV 89.8 90.4  PLT 243 626   Basic Metabolic Panel: Recent Labs  Lab 06/02/20 1221 06/03/20 0422  NA 138 139  K 3.8 4.6  CL 105 106  CO2 24 25  GLUCOSE 130* 246*  BUN 39* 27*  CREATININE 0.79 0.74  CALCIUM 9.0 9.1   GFR: Estimated Creatinine Clearance: 38.5 mL/min (by C-G formula based on SCr of 0.74 mg/dL). Liver Function Tests: Recent Labs  Lab 06/02/20 1221  AST 35  ALT 40  ALKPHOS 46  BILITOT 0.6  PROT 5.4*  ALBUMIN 3.1*   No results for input(s): LIPASE, AMYLASE in the last 168 hours. No results for  input(s): AMMONIA in the last 168 hours. Coagulation Profile: No results for input(s): INR, PROTIME in the last 168 hours. Cardiac Enzymes: No results for input(s): CKTOTAL, CKMB, CKMBINDEX, TROPONINI in the last 168 hours. BNP (last 3 results) No results for input(s): PROBNP in the last 8760 hours. HbA1C: No results for input(s): HGBA1C in the last 72 hours. CBG: No results for input(s): GLUCAP in the last 168 hours. Lipid Profile: No results for input(s): CHOL, HDL, LDLCALC, TRIG, CHOLHDL, LDLDIRECT in the last 72 hours. Thyroid Function Tests: No results for input(s): TSH, T4TOTAL, FREET4, T3FREE, THYROIDAB in the last 72 hours. Anemia Panel: Recent Labs    06/03/20 0422  FERRITIN 28   Sepsis Labs: Recent Labs  Lab 06/02/20 1221  PROCALCITON 1.30  LATICACIDVEN 1.8    Recent Results (from the past 240 hour(s))  Blood culture (routine x 2)     Status: None (Preliminary result)   Collection Time: 06/02/20 12:21 PM   Specimen: BLOOD  Result Value Ref Range Status   Specimen Description   Final  BLOOD LEFT ANTECUBITAL Performed at [facility] [address], [place], [place] [address]    Special Requests   Final    BOTTLES DRAWN AEROBIC AND ANAEROBIC Blood Culture adequate volume Performed at [facility] [address] [place], [place] [address]    Culture   Final    NO GROWTH < 24 HOURS Performed at [facility] [address], [place], [place] [address]    Report Status PENDING  Incomplete  Blood culture (routine x 2)     Status: None (Preliminary result)   Collection Time: 06/02/20 12:21 PM   Specimen: BLOOD  Result Value Ref Range Status   Specimen Description   Final    BLOOD RIGHT ANTECUBITAL Performed at [facility] [address], [place], [place] [address]    Special Requests   Final    BOTTLES DRAWN AEROBIC AND ANAEROBIC Blood Culture adequate volume Performed at [facility] [address], [place], [place] [address]    Culture   Final    NO GROWTH < 24 HOURS Performed at [facility] [address] [place], [place] [address]    Report Status PENDING  Incomplete  Resp Panel by RT-PCR (Flu A&B, Covid) Nasopharyngeal Swab     Status: Abnormal   Collection Time: 06/02/20  1:36 PM   Specimen: Nasopharyngeal Swab; Nasopharyngeal(NP) swabs in vial transport medium  Result Value Ref Range Status   SARS Coronavirus 2 by RT PCR POSITIVE (A) NEGATIVE Final    Comment: RESULT CALLED TO, READ BACK BY AND VERIFIED WITH: WEST,S. RN @1446  ON 03.24.2022 BY COHEN,K (NOTE) SARS-CoV-2 target nucleic acids are DETECTED.  The SARS-CoV-2 RNA is generally detectable in upper respiratory specimens during the acute phase of infection. Positive results are indicative of the presence of the identified virus, but do not rule out bacterial infection or co-infection with other pathogens not detected by the test. Clinical correlation with patient history and other diagnostic information is necessary to determine patient infection status. The expected result is Negative.  Fact Sheet for Patients: EntrepreneurPulse.com.au  Fact Sheet for Healthcare Providers: IncredibleEmployment.be  This test is not yet approved or cleared by the [place] FDA and  has been authorized for detection and/or diagnosis of SARS-CoV-2 by FDA under an Emergency Use Authorization (EUA).  This EUA will remain in effect (meaning this tes t can be used) for the duration of  the COVID-19 declaration under Section 564(b)(1) of the Act, 21 U.S.C. section 360bbb-3(b)(1), unless the authorization is terminated or revoked sooner.     Influenza A by PCR NEGATIVE NEGATIVE Final   Influenza B by PCR NEGATIVE NEGATIVE Final    Comment: (NOTE) The Xpert Xpress SARS-CoV-2/FLU/RSV plus assay is intended as an aid in the diagnosis of influenza from  Nasopharyngeal swab specimens and should not be used as a sole basis for treatment. Nasal washings and aspirates are unacceptable for Xpert Xpress SARS-CoV-2/FLU/RSV testing.  Fact Sheet for Patients: EntrepreneurPulse.com.au  Fact Sheet for Healthcare Providers: IncredibleEmployment.be  This test is not yet approved or cleared by the [place] FDA and has been authorized for detection and/or diagnosis of SARS-CoV-2 by FDA under an Emergency Use Authorization (EUA). This EUA will remain in effect (meaning this test can be used) for the duration of the COVID-19 declaration under Section 564(b)(1) of the Act, 21 U.S.C. section 360bbb-3(b)(1), unless the authorization is terminated or revoked.  Performed at [facility],  [place] [address] [place],  [address]          Radiology Studies: DG Chest 2 View  Result Date: 06/02/2020 CLINICAL DATA:  Weakness and shortness of breath. EXAM: CHEST - 2 VIEW COMPARISON:  Chest x-ray 02/24/2015.  Abdomen/pelvis CT 03/26/2013. FINDINGS: Cardiopericardial silhouette is at upper limits of normal for size. Large right-sided hiatal hernia noted, as better demonstrated on prior abdomen/pelvis CT. Interstitial markings are diffusely coarsened with chronic features. Retrocardiac collapse/consolidative opacity is associated with small pleural effusion. The visualized bony structures of the thorax show no acute abnormality. Telemetry leads overlie the chest. IMPRESSION: 1. Retrocardiac collapse/consolidative opacity with small left pleural effusion. Pneumonia is a concern. 2. Large right-sided hiatal hernia. 3. Diffuse chronic interstitial changes. Electronically Signed   By: Misty Stanley M.D.   On: 06/02/2020 12:32   VAS [place] LOWER EXTREMITY VENOUS (DVT)  Result Date: 06/02/2020  Lower Venous DVT Study Indications: Intermittent swelling of LT leg. Other Indications: Covid-19. Comparison Study:  04-19-2019 Bilateral lower extremity venous study was negative                   for DVT. Performing Technologist: Darlin Coco RDMS,RVT  Examination Guidelines: A complete evaluation includes B-mode imaging, spectral Doppler, color Doppler, and power Doppler as needed of all accessible portions of each vessel. Bilateral testing is considered an integral part of a complete examination. Limited examinations for reoccurring indications may be performed as noted. The reflux portion of the exam is performed with the patient in reverse Trendelenburg.  +-----+---------------+---------+-----------+----------+--------------+ RIGHTCompressibilityPhasicitySpontaneityPropertiesThrombus Aging +-----+---------------+---------+-----------+----------+--------------+ CFV  Full           Yes      Yes                                 +-----+---------------+---------+-----------+----------+--------------+   +---------+---------------+---------+-----------+----------+--------------+ LEFT     CompressibilityPhasicitySpontaneityPropertiesThrombus Aging +---------+---------------+---------+-----------+----------+--------------+ CFV      Full           Yes      Yes                                 +---------+---------------+---------+-----------+----------+--------------+ SFJ      Full                                                        +---------+---------------+---------+-----------+----------+--------------+ FV Prox  Full                                                        +---------+---------------+---------+-----------+----------+--------------+ FV Mid   Full                                                        +---------+---------------+---------+-----------+----------+--------------+ FV DistalFull                                                        +---------+---------------+---------+-----------+----------+--------------+  PFV      Full                                                         +---------+---------------+---------+-----------+----------+--------------+ POP      Full           Yes      Yes                                 +---------+---------------+---------+-----------+----------+--------------+ PTV      Full                                                        +---------+---------------+---------+-----------+----------+--------------+ PERO     Full                                                        +---------+---------------+---------+-----------+----------+--------------+     Summary: RIGHT: - No evidence of common femoral vein obstruction.  LEFT: - There is no evidence of deep vein thrombosis in the lower extremity.  - No cystic structure found in the popliteal fossa.  - Incidental: Appearance of complex, focal collection with irregular, hyperemic borders measuring 3.5 cm at the level of the mid calf. -Heavy calcification of the posterior tibial artery noted.  *See table(s) above for measurements and observations. Electronically signed by Monica Martinez MD on 06/02/2020 at 6:24:25 PM.    Final     Scheduled Meds: . aspirin EC  81 mg Oral Daily  . feeding supplement  237 mL Oral BID BM  . fluticasone furoate-vilanterol  1 puff Inhalation Daily  . gabapentin  100 mg Oral QHS  . heparin  5,000 Units Subcutaneous Q8H  . insulin glargine  10 Units Subcutaneous QHS  . mirabegron ER  25 mg Oral Daily  . pantoprazole (PROTONIX) IV  40 mg Intravenous Q24H  . sertraline  50 mg Oral Daily  . sodium chloride flush  3 mL Intravenous Q12H   Continuous Infusions: . azithromycin 500 mg (06/02/20 1543)  . cefTRIAXone (ROCEPHIN)  IV       LOS: 1 day   Time spent: 40 min  Little Ishikawa, DO [facility]  If 7PM-7AM, please contact night-coverage [email or web address]  06/03/2020, 8:06 AM

## 2020-06-03 NOTE — Evaluation (Addendum)
Physical Therapy Evaluation Patient Details Name: Wendy Velasquez MRN: [id number] DOB: May 03, 1925 Today's Date: 06/03/2020   History of Present Illness  87 year old female with past medical history of cardiac arrest and complete heart block and tachybradycardia syndrome s/p pacemaker, COPD, GERD, iron deficiency anemia, upper GI bleed, HTN, COVID-19, hiatal hernia, hyperlipidemia,  diabetes, esophageal stricture, was recently diagnosis with pneumonia and c/o poor p.o. intake and generalized weakness for the past week. Pt found to be covid+.  Clinical Impression  Pt admitted with above diagnosis. Upon arrival, pt in restroom with nurse tech, pt ambulates with narrow BOS, flexed hips, knees and trunk with RW and min A to steady, minimal bil foot clearance and limited by R knee pain and fatigue. Pt completes multiple STS reps and stand pivot transfers with RW and mod A, fatigues with mobility and requires cues for hand placement and muscle activation. Pt's daughter in room, states pt has been using w/c since pelvic fracture in Sept 2021, facility helps with bathing and dressing, but pt is now requiring more assistance and fatiguing easier than baseline. Pt with success at SNF prior and receives PT/OT occasionally at facility. Recommending SNF vs return to ALF with 24/7 assist and HHPT pending progress and facilities ability to assist pt. Pt currently with functional limitations due to the deficits listed below (see PT Problem List). Pt will benefit from skilled PT to increase their independence and safety with mobility to allow discharge to the venue listed below.       Follow Up Recommendations SNF (vs ALF with 24/7 assist pending progress)    Equipment Recommendations  None recommended by PT    Recommendations for Other Services       Precautions / Restrictions Precautions Precautions: Fall Restrictions Weight Bearing Restrictions: No      Mobility  Bed Mobility  General bed mobility  comments: assisted by nursing staff    Transfers Overall transfer level: Needs assistance Equipment used: Rolling walker (2 wheeled) Transfers: Sit to/from [facility] Sit to Stand: Mod assist Stand pivot transfers: Mod assist    General transfer comment: mod A to power up and pivot to recliner, cues for glute/quad activation and upright posture  Ambulation/Gait Ambulation/Gait assistance: Min assist Gait Distance (Feet): 12 Feet Assistive device: Rolling walker (2 wheeled) Gait Pattern/deviations: Step-to pattern;Decreased stride length;Shuffle;Narrow base of support Gait velocity: decreased   General Gait Details: pt with flexed hips, knees and trunk with limited steps, narrow BOS and minimal to no bil foot clearance, limited by R knee pain and fatigue  Stairs            Wheelchair Mobility    Modified Rankin (Stroke Patients Only)       Balance Overall balance assessment: Needs assistance;History of Falls Sitting-balance support: Feet supported;No upper extremity supported Sitting balance-Leahy Scale: Fair Sitting balance - Comments: seated EOB   Standing balance support: During functional activity;Bilateral upper extremity supported Standing balance-Leahy Scale: Poor Standing balance comment: reliant on UE support/min A        Pertinent Vitals/Pain Pain Assessment: Faces Faces Pain Scale: Hurts little more Pain Location: R knee and bottom Pain Descriptors / Indicators: Grimacing Pain Intervention(s): Limited activity within patient's tolerance;Monitored during session;Repositioned    Home Living Family/patient expects to be discharged to:: Skilled nursing facility  Additional Comments: Pt from [facility], living in double room with spouse, facility assists as needed    Prior Function Level of Independence: Needs assistance   Gait / Transfers  Assistance Needed: ambulating with therapist only, using w/c using arms and legs to  propel/steer for mobilization since fall/pelvic fracture in Sept 2021  ADL's / Homemaking Assistance Needed: facility assists with bathing and dressing daily, facility provides meals and cleaning  Comments: pt's daughter in room assisting with details     Hand Dominance        Extremity/Trunk Assessment   Upper Extremity Assessment Upper Extremity Assessment: Generalized weakness    Lower Extremity Assessment Lower Extremity Assessment: Generalized weakness (AROM WNL, strength grossly 3+/5, denies numbness/tingling)    Cervical / Trunk Assessment Cervical / Trunk Assessment: Kyphotic  Communication   Communication: No difficulties  Cognition Arousal/Alertness: Awake/alert Behavior During Therapy: WFL for tasks assessed/performed Overall Cognitive Status: Within Functional Limits for tasks assessed       General Comments      Exercises     Assessment/Plan    PT Assessment Patient needs continued PT services  PT Problem List Decreased strength;Decreased activity tolerance;Decreased balance;Decreased mobility;Pain       PT Treatment Interventions DME instruction;Gait training;Functional mobility training;Therapeutic activities;Therapeutic exercise;Balance training;Patient/family education    PT Goals (Current goals can be found in the Care Plan section)  Acute Rehab PT Goals Patient Stated Goal: daughter states rehab then return to ALF PT Goal Formulation: With patient/family Time For Goal Achievement: 06/17/20 Potential to Achieve Goals: Good    Frequency Min 2X/week   Barriers to discharge        Co-evaluation               AM-PAC PT "6 Clicks" Mobility  Outcome Measure Help needed turning from your back to your side while in a flat bed without using bedrails?: A Little Help needed moving from lying on your back to sitting on the side of a flat bed without using bedrails?: A Little Help needed moving to and from a bed to a chair (including a  wheelchair)?: A Little Help needed standing up from a chair using your arms (e.g., wheelchair or bedside chair)?: A Little Help needed to walk in hospital room?: A Little Help needed climbing 3-5 steps with a railing? : A Lot 6 Click Score: 17    End of Session Equipment Utilized During Treatment: Gait belt Activity Tolerance: Patient limited by pain Patient left: in chair;with call bell/phone within reach;with nursing/sitter in room;with family/visitor present Nurse Communication: Mobility status PT Visit Diagnosis: Other abnormalities of gait and mobility (R26.89);Muscle weakness (generalized) (M62.81)    Time: 0929-5747 PT Time Calculation (min) (ACUTE ONLY): 25 min   Charges:   PT Evaluation $PT Eval Low Complexity: 1 Low PT Treatments $Therapeutic Activity: 8-22 mins         Tori Hedwig Mcfall PT, DPT 06/03/20, 10:23 AM

## 2020-06-03 NOTE — Progress Notes (Signed)
Initial Nutrition Assessment  INTERVENTION:   -Ensure Enlive po BID, each supplement provides 350 kcal and 20 grams of protein  -Magic cup TID with meals, each supplement provides 290 kcal and 9 grams of protein  -Multivitamin with minerals daily  NUTRITION DIAGNOSIS:   Increased nutrient needs related to acute illness as evidenced by estimated needs.  GOAL:   Patient will meet greater than or equal to 90% of their needs  MONITOR:   PO intake,Supplement acceptance,Labs,Weight trends,I & O's  REASON FOR ASSESSMENT:   Consult Assessment of nutrition requirement/status  ASSESSMENT:   87 year old female with past medical history of cardiac arrest and complete heart block and tachybradycardia syndrome s/p pacemaker, COPD, GERD, iron deficiency anemia, upper GI bleed, hypertension, COVID-19, hiatal hernia, hyperlipidemia, type 2 diabetes, esophageal stricture, recent diagnosis of pneumonia currently on Levaquin who presents to the ED with poor p.o. intake and generalized weakness for the past week.  Patient with poor appetite r/t worsening reflux, history of esophageal stricture. SLP has evaluated pt and recommended regular diet. Pt does have cough with some intakes.   Pt currently wheelchair bound r/t recent hip surgery in September.   Pt currently consuming some liquids while on full liquid diet. Ensure supplements have been ordered already, will add Magic Cups to meals and daily MVI.  Per weight records, pt's weight has remained stable over the past year.  Medications reviewed.  Labs reviewed.  NUTRITION - FOCUSED PHYSICAL EXAM:  Deferred.  Diet Order:   Diet Order            Diet full liquid Room service appropriate? Yes; Fluid consistency: Thin  Diet effective now                 EDUCATION NEEDS:   No education needs have been identified at this time  Skin:  Skin Assessment: Reviewed RN Assessment  Last BM:  3/25- type 3&4  Height:   Ht Readings from  Last 1 Encounters:  06/02/20 5\' 5"  (1.651 m)    Weight:   Wt Readings from Last 1 Encounters:  06/02/20 54.4 kg   BMI:  Body mass index is 19.97 kg/m.  Estimated Nutritional Needs:   Kcal:  1450-1650  Protein:  75-85g  Fluid:  1.6L/day  Clayton Bibles, MS, RD, LDN Inpatient Clinical Dietitian Contact information available via Amion

## 2020-06-03 NOTE — Evaluation (Signed)
Clinical/Bedside Swallow Evaluation Patient Details  Name: Wendy Velasquez MRN: [id number] Date of Birth: [birth year]  Today's Date: 06/03/2020 Time: SLP Start Time (ACUTE ONLY): 1125 SLP Stop Time (ACUTE ONLY): 1572 SLP Time Calculation (min) (ACUTE ONLY): 39 min  Past Medical History:  Past Medical History:  Diagnosis Date  . Asthma   . Cardiac pacemaker in situ   . Complete heart block ([place])   . COPD (chronic obstructive pulmonary disease) ([place])   . Diverticulosis of colon   . GERD (gastroesophageal reflux disease)   . Glaucoma of both eyes   . Heart disease   . History of cardiac arrest    09-11-2012--  symptomatic complete heart block -- hr 20's--  cpr, intubated and temp. pacer until  perm. pacemaker placement  . History of esophageal dilatation    for stricture 2007  . History of hiatal hernia   . History of iron deficiency anemia    hx transfusion's 15 yrs ago , approx 2001  . Hyperlipidemia   . Hypertension   . Neck pain   . Peripheral neuropathy   . Tachy-brady syndrome ([place])   . Type 2 diabetes mellitus ([place])   . Urgency incontinence    Botox injections for bladder.  . Wears glasses    Past Surgical History:  Past Surgical History:  Procedure Laterality Date  . ABDOMINAL HYSTERECTOMY  1976  approx   w/ unilateral salpingoophorectomy  . CATARACT EXTRACTION W/ INTRAOCULAR LENS  IMPLANT, BILATERAL  1990  . COLONOSCOPY WITH ESOPHAGOGASTRODUODENOSCOPY (EGD)  last one 2007  . INTERSTIM IMPLANT PLACEMENT  2012  . INTERSTIM IMPLANT REMOVAL N/A 09/21/2014   Procedure: REMOVAL OF INTERSTIM IMPLANT;  Surgeon: Bjorn Loser, MD;  [facility];  Service: Urology;  Laterality: N/A;  . INTERSTIM IMPLANT REVISION N/A 09/21/2014   Procedure: [place] STAGE ONE AND TWO;  Surgeon: Bjorn Loser, MD;  [facility];  Service: Urology;  Laterality: N/A;  . LAPAROTOMY W/ UNILATERAL SALPINGOOPHORECTOMY  1960's  approx  .  PERMANENT PACEMAKER INSERTION N/A 09/11/2012   Procedure: PERMANENT PACEMAKER INSERTION;  Surgeon: Evans Lance, MD;  Location: [facility] CATH LAB;  Service: Cardiovascular;  Laterality: N/A;  St. Jude Dual Chamber  . RECTOCELE REPAIR  1991  approx   HPI:  87 yo female adm - with respiratory issues.  Pt has h/o cardiac arrest, History of esophageal dilatation for stricture 2007 and 2002, hiatal hernia, cervical dystonia,  COVID +, COPD, GERD, [place], [place] D on   Pt c/o weakness and sob, hx recent diagnosis of pna, pt from nursing home. Hx asthma, pacer since 2014 for symptomatic heart block and cardiac arrest, hx copd, gerd, hh, htn,  hyperlipidemia, tachy/brady syndrome and diabetes.   CXR 3/24 Has large hiatal hernia on right, Retrocardiac collapse/consolidative opacity with small left pleural effusion. Pneumonia is a concern.   Assessment / Plan / Recommendation Clinical Impression  Patient with negative CN exam, her voice is very hoarse - which pt attributes to reflux.   Pt clinically with timely swallow and no indications of aspiration with small sips of thin water, Ensure, applesauce and graham cracker.   No increase work of breathing with po. Pt does verbalize increased wheezing and coughing with intake over the last few days - but not normally occuring.  With sequential swallows of Ensure, she demonstrated overt cough - thus advised to take small single boluses.  Pt is able to self feed fortunately and appears to be able to  cognitively able to follow precautions. Encouraged pt to keep cough and "expectoration" strong for airway protection and pt demonstrated x2 - x1 productive.    Pt reportedly consumed strawberries, okra and tomatoes on day of admit - which caused terrible reflux.  She uses Mylanta and Tums as prn for symptoms not managed by her PPI.  For pt's age of 5 years, her oropharyngeal swallow appears functional.  Educated pt and daughter to aspiration mitigation strategies, including pH  neutral state of water and advised small frequent meals, need to take rest breaks if short of breath.  Pt advises she does not like meal time because people stress her to eat.  Recommend regular/thin diet to allow pt to choose soft items she may be able to manage. SLP Visit Diagnosis: Dysphagia, oropharyngeal phase (R13.12)    Aspiration Risk  Moderate aspiration risk    Diet Recommendation Regular;Thin liquid (encouraging her to order soft foods)   Supervision: Patient able to self feed Compensations: Slow rate;Small sips/bites Postural Changes: Remain upright for at least 30 minutes after po intake;Seated upright at 90 degrees    Other  Recommendations Oral Care Recommendations: Oral care QID   Follow up Recommendations None      Frequency and Duration   n/a         Prognosis   n/a     Swallow Study   General Date of Onset: 06/03/20 HPI: 87 yo female adm - with respiratory issues.  Pt has h/o cardiac arrest, History of esophageal dilatation for stricture 2007 and 2002, hiatal hernia, cervical dystonia,  COVID +, COPD, GERD, [place], [place] D on   Pt c/o weakness and sob, hx recent diagnosis of pna, pt from nursing home. Hx asthma, pacer since 2014 for symptomatic heart block and cardiac arrest, hx copd, gerd, hh, htn,  hyperlipidemia, tachy/brady syndrome and diabetes.   CXR 3/24 Has large hiatal hernia on right, Retrocardiac collapse/consolidative opacity with small left pleural effusion. Pneumonia is a concern. Type of Study: Bedside Swallow Evaluation Previous Swallow Assessment: see hpi Diet Prior to this Study: Thin liquids Temperature Spikes Noted: No Respiratory Status: Room air History of Recent Intubation: No Behavior/Cognition: Alert;Cooperative;Pleasant mood Oral Cavity Assessment: Within Functional Limits Oral Care Completed by SLP: No Oral Cavity - Dentition: Adequate natural dentition Vision: Functional for self-feeding Self-Feeding Abilities: Able to feed  self Patient Positioning: Upright in bed Baseline Vocal Quality: Hoarse Volitional Cough: Weak Volitional Swallow: Unable to elicit    Oral/Motor/Sensory Function Overall Oral Motor/Sensory Function: Generalized oral weakness   Ice Chips Ice chips: Not tested   Thin Liquid Thin Liquid: Within functional limits Presentation: Straw Other Comments: small single sips    Nectar Thick Nectar Thick Liquid: Impaired Presentation: Straw;Self Fed Other Comments: Pt consumed multiple boluses of Ensure sequentially - followed by delayed cough   Honey Thick     Puree Puree: Within functional limits Presentation: Self Fed;Spoon   Solid     Solid: Within functional limits Presentation: Self Fredirick Lathe 06/03/2020,12:26 PM  Kathleen Lime, MS [facility] SLP [place] Office [phone] Pager [phone]

## 2020-06-03 NOTE — NC FL2 (Addendum)
[place] LEVEL OF CARE SCREENING TOOL     IDENTIFICATION  Patient Name: Wendy Velasquez Birthdate: 05-07-1925 Sex: female Admission Date (Current Location): 06/02/2020  [facility] and [place] Number:  Herbalist and Address:  [facility],  [place], [place]      Provider Number: [id number]  Attending Physician Name and Address:  Little Ishikawa, MD  Relative Name and Phone Number:  [name],[name] (Daughter)   [phone]    Current Level of Care: Hospital Recommended Level of Care: [place] Prior Approval Number:    Date Approved/Denied:   PASRR Number: [id number]  Discharge Plan: SNF    Current Diagnoses: Patient Active Problem List   Diagnosis Date Noted  . Community acquired pneumonia 06/02/2020  . Failure to thrive in adult 06/02/2020  . Cervicalgia 03/23/2020  . Urgency incontinence   . Pressure injury of skin 11/23/2019  . Hip fracture ([place]) 11/22/2019  . Acute hypoxemic respiratory failure due to COVID-19 ([place]) 04/23/2019  . Neck pain 04/08/2018  . Cervical dystonia 01/02/2018  . Chronic neck pain 12/05/2017  . Sepsis ([place])   . Type 2 diabetes mellitus with hyperglycemia, with long-term current use of insulin ([place])   . Esophageal reflux   . COPD exacerbation ([place])   . Depression   . Physical deconditioning   . Severe protein-calorie malnutrition ([place])   . DM2 (diabetes mellitus, type 2) ([place]) 02/25/2015  . Anorexia 10/01/2014  . S/P cardiac pacemaker procedure 09/13/2012  . Complete heart block ([place]) 09/11/2012  . Cardiac arrest - due to complete Heart Block, resolved 09/11/2012  . Essential hypertension 09/11/2012  . Hyperlipidemia - on staint 09/11/2012  . COPD (chronic obstructive pulmonary disease) ([place]) 09/11/2012  . Osteoporosis 09/11/2012  . GERD (gastroesophageal reflux disease) 09/11/2012  . Acute respiratory failure - resolved, extubated 11PM 7/3 09/11/2012  .  Cardiogenic shock - resolved 09/11/2012    Orientation RESPIRATION BLADDER Height & Weight     Self, Place, Time, Situation  Normal External catheter Weight: 54.4 kg Height:  5\' 5"  (165.1 cm)  BEHAVIORAL SYMPTOMS/MOOD NEUROLOGICAL BOWEL NUTRITION STATUS   (none)  (none) Incontinent Diet (see d/c summary)  AMBULATORY STATUS COMMUNICATION OF NEEDS Skin   Extensive Assist Verbally Normal                       Personal Care Assistance Level of Assistance  Bathing,Feeding,Dressing Bathing Assistance: Maximum assistance Feeding assistance: Independent Dressing Assistance: Maximum assistance     Functional Limitations Info  Sight,Hearing,Speech Sight Info: Adequate Hearing Info: Adequate Speech Info: Adequate    SPECIAL CARE FACTORS FREQUENCY  PT (By licensed PT),OT (By licensed OT)     PT Frequency: 5X/W OT Frequency: 5X/W            Contractures Contractures Info: Not present    Additional Factors Info  Code Status,Allergies Code Status Info: DNR Allergies Info: NKA           Current Medications (06/03/2020):  This is the current hospital active medication list Current Facility-Administered Medications  Medication Dose Route Frequency Provider Last Rate Last Admin  . acetaminophen (TYLENOL) tablet 650 mg  650 mg Oral Q6H PRN Harold Hedge, MD       Or  . acetaminophen (TYLENOL) suppository 650 mg  650 mg Rectal Q6H PRN Harold Hedge, MD      . albuterol (PROVENTIL) (2.5 MG/3ML) 0.083% nebulizer solution 2.5 mg  2.5 mg  Nebulization Q6H PRN Harold Hedge, MD      . aspirin EC tablet 81 mg  81 mg Oral Daily Harold Hedge, MD   81 mg at 06/03/20 1003  . azithromycin (ZITHROMAX) 500 mg in sodium chloride 0.9 % 250 mL IVPB  500 mg Intravenous Q24H Harold Hedge, MD 250 mL/hr at 06/02/20 1543 500 mg at 06/02/20 1543  . cefTRIAXone (ROCEPHIN) 2 g in sodium chloride 0.9 % 100 mL IVPB  2 g Intravenous Q24H Marva Panda E, MD      . feeding supplement (ENSURE  ENLIVE / ENSURE PLUS) liquid 237 mL  237 mL Oral BID BM Harold Hedge, MD   237 mL at 06/03/20 1005  . fluticasone furoate-vilanterol (BREO ELLIPTA) 200-25 MCG/INH 1 puff  1 puff Inhalation Daily Harold Hedge, MD   1 puff at 06/03/20 1011  . gabapentin (NEURONTIN) capsule 100 mg  100 mg Oral QHS Harold Hedge, MD   100 mg at 06/02/20 2120  . heparin injection 5,000 Units  5,000 Units Subcutaneous Q8H Harold Hedge, MD   5,000 Units at 06/03/20 0546  . insulin glargine (LANTUS) injection 10 Units  10 Units Subcutaneous QHS Harold Hedge, MD   10 Units at 06/02/20 2120  . Ipratropium-Albuterol (COMBIVENT) respimat 1 puff  1 puff Inhalation Q8H PRN Harold Hedge, MD   1 puff at 06/03/20 0413  . mirabegron ER (MYRBETRIQ) tablet 25 mg  25 mg Oral Daily Harold Hedge, MD   25 mg at 06/03/20 1003  . pantoprazole (PROTONIX) injection 40 mg  40 mg Intravenous Q24H Harold Hedge, MD   40 mg at 06/02/20 1825  . polyethylene glycol (MIRALAX / GLYCOLAX) packet 17 g  17 g Oral Daily PRN Harold Hedge, MD      . sertraline (ZOLOFT) tablet 50 mg  50 mg Oral Daily Harold Hedge, MD   50 mg at 06/03/20 1003  . sodium chloride flush (NS) 0.9 % injection 3 mL  3 mL Intravenous Q12H Harold Hedge, MD   3 mL at 06/03/20 1005     Discharge Medications: Please see discharge summary for a list of discharge medications.  Relevant Imaging Results:  Relevant Lab Results:   Additional Information [id number] [place], [place]

## 2020-06-04 DIAGNOSIS — J449 Chronic obstructive pulmonary disease, unspecified: Secondary | ICD-10-CM | POA: Diagnosis not present

## 2020-06-04 DIAGNOSIS — K219 Gastro-esophageal reflux disease without esophagitis: Secondary | ICD-10-CM | POA: Diagnosis not present

## 2020-06-04 DIAGNOSIS — J189 Pneumonia, unspecified organism: Secondary | ICD-10-CM | POA: Diagnosis not present

## 2020-06-04 DIAGNOSIS — I442 Atrioventricular block, complete: Secondary | ICD-10-CM | POA: Diagnosis not present

## 2020-06-04 LAB — BASIC METABOLIC PANEL
Anion gap: 9 (ref 5–15)
BUN: 30 mg/dL — ABNORMAL HIGH (ref 8–23)
CO2: 27 mmol/L (ref 22–32)
Calcium: 9.1 mg/dL (ref 8.9–10.3)
Chloride: 102 mmol/L (ref 98–111)
Creatinine, Ser: 0.73 mg/dL (ref 0.44–1.00)
GFR, Estimated: >60 mL/min (ref >60)
Glucose, Bld: 208 mg/dL — ABNORMAL HIGH (ref 70–99)
Potassium: 3.7 mmol/L (ref 3.5–5.1)
Sodium: 138 mmol/L (ref 135–145)

## 2020-06-04 LAB — CBC
HCT: 32 % — ABNORMAL LOW (ref 36.0–46.0)
Hemoglobin: 10 g/dL — ABNORMAL LOW (ref 12.0–15.0)
MCH: 28.3 pg (ref 26.0–34.0)
MCHC: 31.3 g/dL (ref 30.0–36.0)
MCV: 90.7 fL (ref 80.0–100.0)
Platelets: 296 10*3/uL (ref 150–400)
RBC: 3.53 MIL/uL — ABNORMAL LOW (ref 3.87–5.11)
RDW: 15.7 % — ABNORMAL HIGH (ref 11.5–15.5)
WBC: 7.7 10*3/uL (ref 4.0–10.5)
nRBC: 0 % (ref 0.0–0.2)

## 2020-06-04 LAB — FERRITIN: Ferritin: 23 ng/mL (ref 11–307)

## 2020-06-04 LAB — D-DIMER, QUANTITATIVE: D-Dimer, Quant: 1.32 ug/mL-FEU — ABNORMAL HIGH (ref 0.00–0.50)

## 2020-06-04 LAB — C-REACTIVE PROTEIN: CRP: 1.9 mg/dL — ABNORMAL HIGH (ref <1.0)

## 2020-06-04 NOTE — TOC Progression Note (Signed)
Transition of Care [facility]) - Progression Note    Patient Details  Name: Wendy Velasquez MRN: [id number] Date of Birth: 17-May-1925  [facility] CM/SW Contact  Lennart Pall, LCSW Phone Number: 06/04/2020, 2:00 PM  Clinical Narrative:    Met with pt's daughter, Jeannene Patella, to discuss SNF bed offer from [facility].  She understands that we must proceed with this facility as she has been medically cleared for SNF dc.  Family is agreeable, unfortunately, facility cannot admit now until tomorrow.  Family aware.  Alerting MD/ team.   Expected Discharge Plan: Skilled Nursing Facility Barriers to Discharge: SNF Pending bed offer  Expected Discharge Plan and Services Expected Discharge Plan: [place]   Discharge Planning Services: CM Consult Post Acute Care Choice: [place] Living arrangements for the past 2 months: Assisted Living Facility                                       Social Determinants of Health (SDOH) Interventions    Readmission Risk Interventions No flowsheet data found.

## 2020-06-04 NOTE — Progress Notes (Signed)
PROGRESS NOTE    Wendy Velasquez  TDV:[id number] DOB: 1925-07-31 DOA: 06/02/2020 PCP: [name], MD   Brief Narrative:  This is a 87 year old female with past medical history of cardiac arrest and complete heart block and tachybradycardia syndrome s/p pacemaker, COPD, GERD, iron deficiency anemia, upper GI bleed, hypertension, COVID-19, hiatal hernia, hyperlipidemia, type 2 diabetes, esophageal stricture, recent diagnosis of pneumonia currently on Levaquin who presents to the ED with poor p.o. intake and generalized weakness for the past week.  Patient and daughter at bedside state that this past Sunday she had pretty significant acid reflux at night and this persisted into Monday as well.  She felt weak and went to her PCP on Tuesday and was prescribed Levaquin for pneumonia which was diagnosed on CXR (record not available).  She started Levaquin Tuesday evening and took her Wednesday dose as well as this a.m.  Despite starting antibiotics, she has had persistent generalized weakness and has no appetite.  Since she has not been feeling any better she came to the ED.  She admits to productive cough and occasional shortness of breath though not currently.  Additionally, she admits to left lower extremity swelling for the past week and is wheelchair-bound since a hip surgery in September.  Has no history of blood clots.  Patient had a low-grade fever the other day but has not had any fever since.  Denies chills, chest pain, nausea, vomiting, diarrhea, blood in stool or sputum or any other complaints. CXR-retrocardiac collapse/consolidative opacity with small left pleural effusion, pneumonia is likely.  Assessment & Plan:   Principal Problem:   Community acquired pneumonia Active Problems:   Complete heart block ([facility])   Essential hypertension   COPD (chronic obstructive pulmonary disease) (HCC)   GERD (gastroesophageal reflux disease)   S/P cardiac pacemaker procedure   DM2 (diabetes  mellitus, type 2) (HCC)   Esophageal reflux   Failure to thrive in adult   Acute respiratory failure without hypoxia; suspect secondary to CAP vs aspiration pneumonia Unlikely acute covid infection given extremely high cycle count (42); with normal inflammatory markers and imaging. Afebrile and hemodynamically stable on room air without leukocytosis but has had 2 days of Levaquin CXR concerning for pneumonia, possibly aspiration given her history of acid reflux on Sunday and Monday and history of esophageal stricture SLP eval without overt aspiration - likely reflux related Dietitian consulted - appreciate recommendations Cefepime and azithromycin for CAP/aspiration pneumonia.   Follow cultures  Failure to thrive; protein caloric malnutrition Poor PO intake per family at bedside Dietary following - appreciate recs  Left lower extremity edema Wheelchair-bound LLE [place] negative for DVT  COPD, not in acute exacerbation Continue home inhalers  GERD with history of esophageal stricture IV Protonix daily for now since she has poor p.o. intake to limit pill burden  Normocytic anemia with history of iron deficiency anemia At baseline  Type 2 diabetes Reduce Lantus to 10 units for now until tolerating meals  Hypertension and hyperlipidemia Not on meds at home  Depression Continue Zoloft  Incidental COVID 19+ - Cycle threshold 42, unlikely infectious and likely getting over a recent infection - Discussed with Dr. Baxter Flattery, ID, who recommends airborne precautions and checking inflammatory markers but holding off on treatment at this time  History of complete heart block and tachybradycardia syndrome s/p pacemaker - Stable  DVT prophylaxis: Heparin Code Status: DNR Family Communication: Daughter at bedside; as well as over the phone  Status is: Inpatient  Dispo: The  patient is from: Assisted living facility              Anticipated d/c is to: SNF               Anticipated d/c date is: 24 hours pending clinical course              Patient currently is medically stable for discharge  Consultants:   None  Procedures:   None  Antimicrobials:  Cefepime, azithromycin  Subjective: No acute issues or events overnight, only complaint today is posterior low back pain from being in bed.  Otherwise denies fevers chills nausea vomiting diarrhea constipation headache shortness of breath or chest pain.  Objective: Vitals:   06/03/20 0411 06/03/20 1350 06/03/20 2048 06/04/20 0516  BP: 126/60 (!) 118/52 (!) 119/57 (!) 138/53  Pulse: 78 78 77 79  Resp: 20 (!) 22 16 20   Temp: 98.5 F (36.9 C) 98.4 F (36.9 C) 97.9 F (36.6 C) (!) 97.5 F (36.4 C)  TempSrc:   Oral Oral  SpO2: 99% 95% 95% 94%  Weight:      Height:        Intake/Output Summary (Last 24 hours) at 06/04/2020 0753 Last data filed at 06/04/2020 0500 Gross per 24 hour  Intake 573 ml  Output 850 ml  Net -277 ml   Filed Weights   06/02/20 1116  Weight: 54.4 kg    Examination:  General exam: Appears calm and comfortable  Respiratory system: Clear to auscultation. Respiratory effort normal. Cardiovascular system: S1 & S2 heard, RRR. No JVD, murmurs, rubs, gallops or clicks. No pedal edema. Gastrointestinal system: Abdomen is nondistended, soft and nontender. No organomegaly or masses felt. Normal bowel sounds heard. Central nervous system: Alert and oriented. No focal neurological deficits. Extremities: Symmetric 5 x 5 power. Skin: No rashes, lesions or ulcers Psychiatry: Judgement and insight appear normal. Mood & affect appropriate.     Data Reviewed: I have personally reviewed following labs and imaging studies  CBC: Recent Labs  Lab 06/02/20 1221 06/03/20 0422 06/04/20 0358  WBC 9.3 8.3 7.7  NEUTROABS 7.7  --   --   HGB 9.2* 9.6* 10.0*  HCT 29.8* 31.1* 32.0*  MCV 89.8 90.4 90.7  PLT 243 266 449   Basic Metabolic Panel: Recent Labs  Lab 06/02/20 1221  06/03/20 0422 06/04/20 0358  NA 138 139 138  K 3.8 4.6 3.7  CL 105 106 102  CO2 24 25 27   GLUCOSE 130* 246* 208*  BUN 39* 27* 30*  CREATININE 0.79 0.74 0.73  CALCIUM 9.0 9.1 9.1   GFR: Estimated Creatinine Clearance: 38.5 mL/min (by C-G formula based on SCr of 0.73 mg/dL). Liver Function Tests: Recent Labs  Lab 06/02/20 1221  AST 35  ALT 40  ALKPHOS 46  BILITOT 0.6  PROT 5.4*  ALBUMIN 3.1*   No results for input(s): LIPASE, AMYLASE in the last 168 hours. No results for input(s): AMMONIA in the last 168 hours. Coagulation Profile: No results for input(s): INR, PROTIME in the last 168 hours. Cardiac Enzymes: No results for input(s): CKTOTAL, CKMB, CKMBINDEX, TROPONINI in the last 168 hours. BNP (last 3 results) No results for input(s): PROBNP in the last 8760 hours. HbA1C: No results for input(s): HGBA1C in the last 72 hours. CBG: No results for input(s): GLUCAP in the last 168 hours. Lipid Profile: No results for input(s): CHOL, HDL, LDLCALC, TRIG, CHOLHDL, LDLDIRECT in the last 72 hours. Thyroid Function Tests: No results for input(s): TSH,  T4TOTAL, FREET4, T3FREE, THYROIDAB in the last 72 hours. Anemia Panel: Recent Labs    06/03/20 0422 06/04/20 0358  FERRITIN 28 23   Sepsis Labs: Recent Labs  Lab 06/02/20 1221  PROCALCITON 1.30  LATICACIDVEN 1.8    Recent Results (from the past 240 hour(s))  Blood culture (routine x 2)     Status: None (Preliminary result)   Collection Time: 06/02/20 12:21 PM   Specimen: BLOOD  Result Value Ref Range Status   Specimen Description   Final    BLOOD LEFT ANTECUBITAL Performed at [facility] [address] [place], [place] [address]    Special Requests   Final    BOTTLES DRAWN AEROBIC AND ANAEROBIC Blood Culture adequate volume Performed at [facility] [address], [place], [place] [address]    Culture   Final    NO GROWTH < 24 HOURS Performed at [facility] [address], [place], [place] [address]    Report Status PENDING  Incomplete  Blood culture (routine x 2)     Status: None (Preliminary result)   Collection Time: 06/02/20 12:21 PM   Specimen: BLOOD  Result Value Ref Range Status   Specimen Description   Final    BLOOD RIGHT ANTECUBITAL Performed at [facility] [address], [place], [place] [address]    Special Requests   Final    BOTTLES DRAWN AEROBIC AND ANAEROBIC Blood Culture adequate volume Performed at [facility] [address] [place], [place] [address]    Culture   Final    NO GROWTH < 24 HOURS Performed at [facility] [address], [place], [place] [address]    Report Status PENDING  Incomplete  Resp Panel by RT-PCR (Flu A&B, Covid) Nasopharyngeal Swab     Status: Abnormal   Collection Time: 06/02/20  1:36 PM   Specimen: Nasopharyngeal Swab; Nasopharyngeal(NP) swabs in vial transport medium  Result Value Ref Range Status   SARS Coronavirus 2 by RT PCR POSITIVE (A) NEGATIVE Final    Comment: RESULT CALLED TO, READ BACK BY AND VERIFIED WITH: WEST,S. RN @1446  ON 03.24.2022 BY COHEN,K (NOTE) SARS-CoV-2 target nucleic acids are DETECTED.  The SARS-CoV-2 RNA is generally detectable in upper respiratory specimens during the acute phase of infection. Positive results are indicative of the presence of the identified virus, but do not rule out bacterial infection or co-infection with other pathogens not detected by the test. Clinical correlation with patient history and other diagnostic information is necessary to determine patient infection status. The expected result is Negative.  Fact Sheet for Patients: EntrepreneurPulse.com.au  Fact Sheet for Healthcare Providers: IncredibleEmployment.be  This test is not yet approved or cleared by the [place] FDA and  has been authorized for detection and/or diagnosis of SARS-CoV-2  by FDA under an Emergency Use Authorization (EUA).  This EUA will remain in effect (meaning this tes t can be used) for the duration of  the COVID-19 declaration under Section 564(b)(1) of the Act, 21 U.S.C. section 360bbb-3(b)(1), unless the authorization is terminated or revoked sooner.     Influenza A by PCR NEGATIVE NEGATIVE Final   Influenza B by PCR NEGATIVE NEGATIVE Final    Comment: (NOTE) The Xpert Xpress SARS-CoV-2/FLU/RSV plus assay is intended as an aid in the diagnosis of influenza from Nasopharyngeal swab specimens and should not be used as a sole basis for treatment. Nasal washings and aspirates are unacceptable for Xpert  Xpress SARS-CoV-2/FLU/RSV testing.  Fact Sheet for Patients: EntrepreneurPulse.com.au  Fact Sheet for Healthcare Providers: IncredibleEmployment.be  This test is not yet approved or cleared by the [place] FDA and has been authorized for detection and/or diagnosis of SARS-CoV-2 by FDA under an Emergency Use Authorization (EUA). This EUA will remain in effect (meaning this test can be used) for the duration of the COVID-19 declaration under Section 564(b)(1) of the Act, 21 U.S.C. section 360bbb-3(b)(1), unless the authorization is terminated or revoked.  Performed at [facility], [place] [address] [place], [place] [address]          Radiology Studies: DG Chest 2 View  Result Date: 06/02/2020 CLINICAL DATA:  Weakness and shortness of breath. EXAM: CHEST - 2 VIEW COMPARISON:  Chest x-ray 02/24/2015.  Abdomen/pelvis CT 03/26/2013. FINDINGS: Cardiopericardial silhouette is at upper limits of normal for size. Large right-sided hiatal hernia noted, as better demonstrated on prior abdomen/pelvis CT. Interstitial markings are diffusely coarsened with chronic features. Retrocardiac collapse/consolidative opacity is associated with small pleural effusion. The visualized bony structures of the  thorax show no acute abnormality. Telemetry leads overlie the chest. IMPRESSION: 1. Retrocardiac collapse/consolidative opacity with small left pleural effusion. Pneumonia is a concern. 2. Large right-sided hiatal hernia. 3. Diffuse chronic interstitial changes. Electronically Signed   By: Misty Stanley M.D.   On: 06/02/2020 12:32   VAS [place] LOWER EXTREMITY VENOUS (DVT)  Result Date: 06/02/2020  Lower Venous DVT Study Indications: Intermittent swelling of LT leg. Other Indications: Covid-19. Comparison Study: 04-19-2019 Bilateral lower extremity venous study was negative                   for DVT. Performing Technologist: Darlin Coco RDMS,RVT  Examination Guidelines: A complete evaluation includes B-mode imaging, spectral Doppler, color Doppler, and power Doppler as needed of all accessible portions of each vessel. Bilateral testing is considered an integral part of a complete examination. Limited examinations for reoccurring indications may be performed as noted. The reflux portion of the exam is performed with the patient in reverse Trendelenburg.  +-----+---------------+---------+-----------+----------+--------------+  RIGHT Compressibility Phasicity Spontaneity Properties Thrombus Aging  +-----+---------------+---------+-----------+----------+--------------+  CFV   Full            Yes       Yes                                    +-----+---------------+---------+-----------+----------+--------------+   +---------+---------------+---------+-----------+----------+--------------+  LEFT      Compressibility Phasicity Spontaneity Properties Thrombus Aging  +---------+---------------+---------+-----------+----------+--------------+  CFV       Full            Yes       Yes                                    +---------+---------------+---------+-----------+----------+--------------+  SFJ       Full                                                              +---------+---------------+---------+-----------+----------+--------------+  FV Prox   Full                                                             +---------+---------------+---------+-----------+----------+--------------+  FV Mid    Full                                                             +---------+---------------+---------+-----------+----------+--------------+  FV Distal Full                                                             +---------+---------------+---------+-----------+----------+--------------+  PFV       Full                                                             +---------+---------------+---------+-----------+----------+--------------+  POP       Full            Yes       Yes                                    +---------+---------------+---------+-----------+----------+--------------+  PTV       Full                                                             +---------+---------------+---------+-----------+----------+--------------+  PERO      Full                                                             +---------+---------------+---------+-----------+----------+--------------+     Summary: RIGHT: - No evidence of common femoral vein obstruction.  LEFT: - There is no evidence of deep vein thrombosis in the lower extremity.  - No cystic structure found in the popliteal fossa.  - Incidental: Appearance of complex, focal collection with irregular, hyperemic borders measuring 3.5 cm at the level of the mid calf. -Heavy calcification of the posterior tibial artery noted.  *See table(s) above for measurements and observations. Electronically signed by Monica Martinez MD on 06/02/2020 at 6:24:25 PM.    Final     Scheduled Meds:  aspirin EC  81 mg Oral Daily   azithromycin  500 mg Oral q1800   feeding supplement  237 mL Oral BID BM   fluticasone furoate-vilanterol  1 puff Inhalation Daily   gabapentin  100 mg Oral QHS   heparin  5,000 Units Subcutaneous Q8H    insulin glargine  10 Units Subcutaneous QHS   mirabegron ER  25 mg Oral Daily   multivitamin with minerals  1 tablet Oral Daily   pantoprazole  40 mg Oral QAC supper   sertraline  50 mg Oral Daily  sodium chloride flush  3 mL Intravenous Q12H   Continuous Infusions:  cefTRIAXone (ROCEPHIN)  IV 2 g (06/03/20 1443)     LOS: 2 days   Time spent: 40 min  Little Ishikawa, DO [facility]  If 7PM-7AM, please contact night-coverage [email or web address]  06/04/2020, 7:53 AM

## 2020-06-05 DIAGNOSIS — K449 Diaphragmatic hernia without obstruction or gangrene: Secondary | ICD-10-CM | POA: Diagnosis not present

## 2020-06-05 DIAGNOSIS — J449 Chronic obstructive pulmonary disease, unspecified: Secondary | ICD-10-CM | POA: Diagnosis not present

## 2020-06-05 DIAGNOSIS — E440 Moderate protein-calorie malnutrition: Secondary | ICD-10-CM | POA: Diagnosis not present

## 2020-06-05 DIAGNOSIS — M6281 Muscle weakness (generalized): Secondary | ICD-10-CM | POA: Diagnosis not present

## 2020-06-05 DIAGNOSIS — I959 Hypotension, unspecified: Secondary | ICD-10-CM | POA: Diagnosis not present

## 2020-06-05 DIAGNOSIS — U071 COVID-19: Secondary | ICD-10-CM | POA: Diagnosis not present

## 2020-06-05 DIAGNOSIS — K5790 Diverticulosis of intestine, part unspecified, without perforation or abscess without bleeding: Secondary | ICD-10-CM | POA: Diagnosis not present

## 2020-06-05 DIAGNOSIS — E43 Unspecified severe protein-calorie malnutrition: Secondary | ICD-10-CM | POA: Diagnosis not present

## 2020-06-05 DIAGNOSIS — Z9189 Other specified personal risk factors, not elsewhere classified: Secondary | ICD-10-CM | POA: Diagnosis not present

## 2020-06-05 DIAGNOSIS — Z8616 Personal history of COVID-19: Secondary | ICD-10-CM | POA: Diagnosis not present

## 2020-06-05 DIAGNOSIS — M542 Cervicalgia: Secondary | ICD-10-CM | POA: Diagnosis not present

## 2020-06-05 DIAGNOSIS — E1165 Type 2 diabetes mellitus with hyperglycemia: Secondary | ICD-10-CM | POA: Diagnosis not present

## 2020-06-05 DIAGNOSIS — E1159 Type 2 diabetes mellitus with other circulatory complications: Secondary | ICD-10-CM | POA: Diagnosis not present

## 2020-06-05 DIAGNOSIS — J690 Pneumonitis due to inhalation of food and vomit: Secondary | ICD-10-CM | POA: Diagnosis not present

## 2020-06-05 DIAGNOSIS — J189 Pneumonia, unspecified organism: Secondary | ICD-10-CM | POA: Diagnosis not present

## 2020-06-05 DIAGNOSIS — I10 Essential (primary) hypertension: Secondary | ICD-10-CM | POA: Diagnosis not present

## 2020-06-05 DIAGNOSIS — L89151 Pressure ulcer of sacral region, stage 1: Secondary | ICD-10-CM | POA: Diagnosis not present

## 2020-06-05 DIAGNOSIS — K222 Esophageal obstruction: Secondary | ICD-10-CM | POA: Diagnosis not present

## 2020-06-05 DIAGNOSIS — I495 Sick sinus syndrome: Secondary | ICD-10-CM | POA: Diagnosis not present

## 2020-06-05 DIAGNOSIS — I998 Other disorder of circulatory system: Secondary | ICD-10-CM | POA: Diagnosis not present

## 2020-06-05 DIAGNOSIS — M2550 Pain in unspecified joint: Secondary | ICD-10-CM | POA: Diagnosis not present

## 2020-06-05 DIAGNOSIS — I152 Hypertension secondary to endocrine disorders: Secondary | ICD-10-CM | POA: Diagnosis not present

## 2020-06-05 DIAGNOSIS — R630 Anorexia: Secondary | ICD-10-CM | POA: Diagnosis not present

## 2020-06-05 DIAGNOSIS — J984 Other disorders of lung: Secondary | ICD-10-CM | POA: Diagnosis not present

## 2020-06-05 DIAGNOSIS — Z8719 Personal history of other diseases of the digestive system: Secondary | ICD-10-CM | POA: Diagnosis not present

## 2020-06-05 DIAGNOSIS — Z7401 Bed confinement status: Secondary | ICD-10-CM | POA: Diagnosis not present

## 2020-06-05 DIAGNOSIS — K2100 Gastro-esophageal reflux disease with esophagitis, without bleeding: Secondary | ICD-10-CM | POA: Diagnosis not present

## 2020-06-05 DIAGNOSIS — I442 Atrioventricular block, complete: Secondary | ICD-10-CM | POA: Diagnosis not present

## 2020-06-05 DIAGNOSIS — R2681 Unsteadiness on feet: Secondary | ICD-10-CM | POA: Diagnosis not present

## 2020-06-05 DIAGNOSIS — J9600 Acute respiratory failure, unspecified whether with hypoxia or hypercapnia: Secondary | ICD-10-CM | POA: Diagnosis not present

## 2020-06-05 DIAGNOSIS — M1388 Other specified arthritis, other site: Secondary | ICD-10-CM | POA: Diagnosis not present

## 2020-06-05 DIAGNOSIS — R634 Abnormal weight loss: Secondary | ICD-10-CM | POA: Diagnosis not present

## 2020-06-05 DIAGNOSIS — R2689 Other abnormalities of gait and mobility: Secondary | ICD-10-CM | POA: Diagnosis not present

## 2020-06-05 DIAGNOSIS — D6489 Other specified anemias: Secondary | ICD-10-CM | POA: Diagnosis not present

## 2020-06-05 DIAGNOSIS — J188 Other pneumonia, unspecified organism: Secondary | ICD-10-CM | POA: Diagnosis not present

## 2020-06-05 DIAGNOSIS — F329 Major depressive disorder, single episode, unspecified: Secondary | ICD-10-CM | POA: Diagnosis not present

## 2020-06-05 DIAGNOSIS — I4891 Unspecified atrial fibrillation: Secondary | ICD-10-CM | POA: Diagnosis not present

## 2020-06-05 DIAGNOSIS — Z950 Presence of cardiac pacemaker: Secondary | ICD-10-CM | POA: Diagnosis not present

## 2020-06-05 DIAGNOSIS — E118 Type 2 diabetes mellitus with unspecified complications: Secondary | ICD-10-CM | POA: Diagnosis not present

## 2020-06-05 DIAGNOSIS — M6259 Muscle wasting and atrophy, not elsewhere classified, multiple sites: Secondary | ICD-10-CM | POA: Diagnosis not present

## 2020-06-05 DIAGNOSIS — E119 Type 2 diabetes mellitus without complications: Secondary | ICD-10-CM | POA: Diagnosis not present

## 2020-06-05 DIAGNOSIS — D500 Iron deficiency anemia secondary to blood loss (chronic): Secondary | ICD-10-CM | POA: Diagnosis not present

## 2020-06-05 DIAGNOSIS — R1312 Dysphagia, oropharyngeal phase: Secondary | ICD-10-CM | POA: Diagnosis not present

## 2020-06-05 DIAGNOSIS — R278 Other lack of coordination: Secondary | ICD-10-CM | POA: Diagnosis not present

## 2020-06-05 DIAGNOSIS — K219 Gastro-esophageal reflux disease without esophagitis: Secondary | ICD-10-CM | POA: Diagnosis not present

## 2020-06-05 DIAGNOSIS — K257 Chronic gastric ulcer without hemorrhage or perforation: Secondary | ICD-10-CM | POA: Diagnosis not present

## 2020-06-05 DIAGNOSIS — R41841 Cognitive communication deficit: Secondary | ICD-10-CM | POA: Diagnosis not present

## 2020-06-05 DIAGNOSIS — D649 Anemia, unspecified: Secondary | ICD-10-CM | POA: Diagnosis not present

## 2020-06-05 LAB — BASIC METABOLIC PANEL
Anion gap: 10 (ref 5–15)
BUN: 30 mg/dL — ABNORMAL HIGH (ref 8–23)
CO2: 24 mmol/L (ref 22–32)
Calcium: 8.5 mg/dL — ABNORMAL LOW (ref 8.9–10.3)
Chloride: 103 mmol/L (ref 98–111)
Creatinine, Ser: 0.68 mg/dL (ref 0.44–1.00)
GFR, Estimated: >60 mL/min (ref >60)
Glucose, Bld: 193 mg/dL — ABNORMAL HIGH (ref 70–99)
Potassium: 4.1 mmol/L (ref 3.5–5.1)
Sodium: 137 mmol/L (ref 135–145)

## 2020-06-05 LAB — CBC
HCT: 31.1 % — ABNORMAL LOW (ref 36.0–46.0)
Hemoglobin: 9.5 g/dL — ABNORMAL LOW (ref 12.0–15.0)
MCH: 28.3 pg (ref 26.0–34.0)
MCHC: 30.5 g/dL (ref 30.0–36.0)
MCV: 92.6 fL (ref 80.0–100.0)
Platelets: 311 10*3/uL (ref 150–400)
RBC: 3.36 MIL/uL — ABNORMAL LOW (ref 3.87–5.11)
RDW: 15.3 % (ref 11.5–15.5)
WBC: 9.1 10*3/uL (ref 4.0–10.5)
nRBC: 0 % (ref 0.0–0.2)

## 2020-06-05 LAB — C-REACTIVE PROTEIN: CRP: 1.7 mg/dL — ABNORMAL HIGH (ref <1.0)

## 2020-06-05 LAB — D-DIMER, QUANTITATIVE: D-Dimer, Quant: 2.3 ug/mL-FEU — ABNORMAL HIGH (ref 0.00–0.50)

## 2020-06-05 LAB — FERRITIN: Ferritin: 21 ng/mL (ref 11–307)

## 2020-06-05 MED ORDER — AZITHROMYCIN 250 MG PO TABS: ORAL_TABLET | ORAL | 0 refills | Status: DC

## 2020-06-05 NOTE — TOC Progression Note (Signed)
Transition of Care [facility]) - Progression Note    Patient Details  Name: Wendy Velasquez MRN: [id number] Date of Birth: Jun 14, 1925  Transition of [facility]) CM/SW Contact  Sherhonda Gaspar, [place], [place] Phone Number: 06/05/2020, 12:00 PM  Clinical Narrative:    Phone call to [facility] to confirm discharge there today. Awaiting return call.    [address] LCSW Transition of Care [phone]    Expected Discharge Plan: Skilled Nursing Facility Barriers to Discharge: SNF Pending bed offer  Expected Discharge Plan and Services Expected Discharge Plan: [place]   Discharge Planning Services: CM Consult Post Acute Care Choice: [place] Living arrangements for the past 2 months: [place] Expected Discharge Date: 06/05/20                                     Social Determinants of Health (SDOH) Interventions    Readmission Risk Interventions No flowsheet data found.

## 2020-06-05 NOTE — TOC Progression Note (Signed)
Transition of Care [facility]) - Progression Note    Patient Details  Name: Wendy Velasquez MRN: [id number] Date of Birth: Feb 29, 1926  [facility]) CM/SW Contact  Elliot Gurney [place], [place] Phone Number: [phone] 06/05/2020, 1:22 PM  Clinical Narrative:    Patient to be transported to [facility] by [facility] today. Patient will be going to room 806A. Report to be called in to the RN taking care of patient in [phone]. Patient's nurse informed.   [address], LCSW Transition of Care [phone]    Expected Discharge Plan: Skilled Nursing Facility Barriers to Discharge: SNF Pending bed offer  Expected Discharge Plan and Services Expected Discharge Plan: [place]   Discharge Planning Services: CM Consult Post Acute Care Choice: [place] Living arrangements for the past 2 months: [place] Expected Discharge Date: 06/05/20                                     Social Determinants of Health (SDOH) Interventions    Readmission Risk Interventions No flowsheet data found.

## 2020-06-05 NOTE — Discharge Summary (Signed)
Physician Discharge Summary  Wendy Velasquez TKW:[id number] DOB: 1925/09/23 DOA: 06/02/2020  PCP: [name], MD  Admit date: 06/02/2020 Discharge date: 06/05/2020  Admitted From: Facility Disposition: Same  Recommendations for Outpatient Follow-up:  1. Follow up with PCP in 1-2 weeks 2. Please obtain BMP/CBC in one week  Discharge Condition: Stable CODE STATUS: DNR Diet recommendation: Liberal diet, as tolerated  Brief/Interim Summary: This is a 87 year old female with past medical history of cardiac arrest and complete heart block and tachybradycardia syndrome s/p pacemaker, COPD, GERD, iron deficiency anemia,upper GI bleed,hypertension,COVID-19,hiatal hernia,hyperlipidemia, type 2 diabetes, esophageal stricture, recent diagnosis of pneumonia currently on Levaquin who presents to the ED with poor p.o. intakeand generalized weaknessfor the past week.Patient and daughter at bedside state that this past Sunday she had pretty significant acid reflux at night and this persisted into Monday as well. She felt weak and went to her PCP on Tuesday and was prescribed Levaquin for pneumonia which was diagnosed on CXR (record not available). She started Levaquin Tuesday evening and took her Wednesday dose as well as this a.m. Despite starting antibiotics, she has had persistent generalized weakness and has no appetite. Since she has not been feeling any better she came to the ED. She admits to productive cough and occasional shortness of breath though not currently. Additionally, she admits to left lower extremity swelling for the past week and is wheelchair-bound since a hip surgery in September. Has no history of blood clots. Patient had a low-grade fever the other day but has not had any fever since. Denies chills, chest pain, nausea, vomiting, diarrhea, blood in stool or sputum or any other complaints. CXR-retrocardiac collapse/consolidative opacity with small left pleural  effusion, pneumonia is likely.  Patient admitted as above with acute respiratory failure without hypoxia in the setting of likely aspiration pneumonia.  Patient improved quite drastically over the past 48 hours, antibiotics were transitioned to p.o, patient continues to have reflux symptoms but no clear aspiration noted with speech evaluation.  Otherwise patient stable and agreeable for discharge to facility, 3 daughters were discussed with over the phone at bedside and agree, lengthy discussion about goals of care given patient's advanced age and recurrent issues.  We did discuss that follow-up with PCP and outpatient palliative care and hospice would not be unreasonable given patient's age and recurrent issues.  No medication changes other than antibiotic addition.  Discharge Diagnoses:  Principal Problem:   Community acquired pneumonia Active Problems:   Complete heart block (HCC)   Essential hypertension   COPD (chronic obstructive pulmonary disease) (HCC)   GERD (gastroesophageal reflux disease)   S/P cardiac pacemaker procedure   DM2 (diabetes mellitus, type 2) (HCC)   Esophageal reflux   Failure to thrive in adult    Discharge Instructions  Discharge Instructions    Call MD for:  difficulty breathing, headache or visual disturbances   Complete by: As directed    Call MD for:  temperature >100.4   Complete by: As directed    Diet - low sodium heart healthy   Complete by: As directed    Increase activity slowly   Complete by: As directed      Allergies as of 06/05/2020   No Known Allergies     Medication List    STOP taking these medications   levofloxacin 500 MG tablet Commonly known as: LEVAQUIN   trimethoprim 100 MG tablet Commonly known as: TRIMPEX     TAKE these medications   acetaminophen 325 MG tablet  Commonly known as: TYLENOL Take 2 tablets (650 mg total) by mouth every 6 (six) hours as needed. What changed: reasons to take this   albuterol (2.5  MG/3ML) 0.083% nebulizer solution Commonly known as: PROVENTIL Take 3 mLs (2.5 mg total) by nebulization every 6 (six) hours as needed for wheezing or shortness of breath.   aspirin 81 MG chewable tablet Chew 81 mg by mouth daily.   azithromycin 250 MG tablet Commonly known as: Zithromax Take 1 tablet daily PO until completed   Biofreeze 4 % Gel Generic drug: Menthol (Topical Analgesic) Apply 1 application topically daily as needed (pain). Apply to Right Leg.   cetirizine 10 MG tablet Commonly known as: ZYRTEC Take 1 tablet (10 mg total) by mouth daily as needed for allergies. What changed: when to take this   Combivent Respimat 20-100 MCG/ACT Aers respimat Generic drug: Ipratropium-Albuterol Inhale 1 puff into the lungs every 8 (eight) hours as needed for wheezing.   esomeprazole 40 MG capsule Commonly known as: NEXIUM Take 40 mg by mouth daily at 12 noon.   famotidine 20 MG tablet Commonly known as: PEPCID Take 20 mg by mouth daily as needed for heartburn or indigestion.   fluticasone 50 MCG/ACT nasal spray Commonly known as: FLONASE Place 2 sprays into both nostrils daily as needed for allergies. SHAKE AS DIRECTED   Fluticasone-Salmeterol 250-50 MCG/DOSE Aepb Commonly known as: ADVAIR Inhale 1 puff into the lungs 2 (two) times daily.   gabapentin 100 MG capsule Commonly known as: NEURONTIN Take 1 capsule (100 mg total) by mouth at bedtime.   ibandronate 150 MG tablet Commonly known as: BONIVA Take 1 tablet (150 mg total) by mouth every 30 (thirty) days. Take in the morning with a full glass of water, on an empty stomach, and do not take anything else by mouth or lie down for the next 30 min.   Lantus SoloStar 100 UNIT/ML Solostar Pen Generic drug: insulin glargine Inject 8 Units into the skin at bedtime.   latanoprost 0.005 % ophthalmic solution Commonly known as: XALATAN Place 1 drop into both eyes at bedtime.   lidocaine-prilocaine cream Commonly known  as: EMLA Apply 1 application topically 4 (four) times daily as needed. What changed: reasons to take this   metFORMIN 1000 MG tablet Commonly known as: GLUCOPHAGE Take 1 tablet (1,000 mg total) by mouth 2 (two) times daily with a meal.   Myrbetriq 25 MG Tb24 tablet Generic drug: mirabegron ER Take 1 tablet (25 mg total) by mouth daily.   sertraline 50 MG tablet Commonly known as: ZOLOFT Take 1 tablet (50 mg total) by mouth 2 (two) times daily. What changed: when to take this   Simbrinza 1-0.2 % Susp Generic drug: Brinzolamide-Brimonidine Apply 1 drop to eye at bedtime. What changed: how to take this   vitamin B-12 1000 MCG tablet Commonly known as: CYANOCOBALAMIN Take 1 tablet (1,000 mcg total) by mouth daily.   Vitamin D 50 MCG (2000 UT) tablet Take 2,000 Units by mouth daily.       Contact information for after-discharge care    Destination    [facility]erred [facility] .   Service: Skilled Nursing Contact information: [place] [phone]                 No Known Allergies  Consultations:  None   Procedures/Studies: DG Chest 2 View  Result Date: 06/02/2020 CLINICAL DATA:  Weakness and shortness of breath. EXAM: CHEST - 2  VIEW COMPARISON:  Chest x-ray 02/24/2015.  Abdomen/pelvis CT 03/26/2013. FINDINGS: Cardiopericardial silhouette is at upper limits of normal for size. Large right-sided hiatal hernia noted, as better demonstrated on prior abdomen/pelvis CT. Interstitial markings are diffusely coarsened with chronic features. Retrocardiac collapse/consolidative opacity is associated with small pleural effusion. The visualized bony structures of the thorax show no acute abnormality. Telemetry leads overlie the chest. IMPRESSION: 1. Retrocardiac collapse/consolidative opacity with small left pleural effusion. Pneumonia is a concern. 2. Large right-sided hiatal hernia. 3. Diffuse chronic interstitial changes.  Electronically Signed   By: Misty Stanley M.D.   On: 06/02/2020 12:32   CUP PACEART REMOTE DEVICE CHECK  Result Date: 05/16/2020 Scheduled remote reviewed. Normal device function.  46 new AMS events all <1 minute each Next remote 91 days. HB  VAS [place] LOWER EXTREMITY VENOUS (DVT)  Result Date: 06/02/2020  Lower Venous DVT Study Indications: Intermittent swelling of LT leg. Other Indications: Covid-19. Comparison Study: 04-19-2019 Bilateral lower extremity venous study was negative                   for DVT. Performing Technologist: Darlin Coco RDMS,RVT  Examination Guidelines: A complete evaluation includes B-mode imaging, spectral Doppler, color Doppler, and power Doppler as needed of all accessible portions of each vessel. Bilateral testing is considered an integral part of a complete examination. Limited examinations for reoccurring indications may be performed as noted. The reflux portion of the exam is performed with the patient in reverse Trendelenburg.  +-----+---------------+---------+-----------+----------+--------------+ RIGHTCompressibilityPhasicitySpontaneityPropertiesThrombus Aging +-----+---------------+---------+-----------+----------+--------------+ CFV  Full           Yes      Yes                                 +-----+---------------+---------+-----------+----------+--------------+   +---------+---------------+---------+-----------+----------+--------------+ LEFT     CompressibilityPhasicitySpontaneityPropertiesThrombus Aging +---------+---------------+---------+-----------+----------+--------------+ CFV      Full           Yes      Yes                                 +---------+---------------+---------+-----------+----------+--------------+ SFJ      Full                                                        +---------+---------------+---------+-----------+----------+--------------+ FV Prox  Full                                                         +---------+---------------+---------+-----------+----------+--------------+ FV Mid   Full                                                        +---------+---------------+---------+-----------+----------+--------------+ FV DistalFull                                                        +---------+---------------+---------+-----------+----------+--------------+  PFV      Full                                                        +---------+---------------+---------+-----------+----------+--------------+ POP      Full           Yes      Yes                                 +---------+---------------+---------+-----------+----------+--------------+ PTV      Full                                                        +---------+---------------+---------+-----------+----------+--------------+ PERO     Full                                                        +---------+---------------+---------+-----------+----------+--------------+     Summary: RIGHT: - No evidence of common femoral vein obstruction.  LEFT: - There is no evidence of deep vein thrombosis in the lower extremity.  - No cystic structure found in the popliteal fossa.  - Incidental: Appearance of complex, focal collection with irregular, hyperemic borders measuring 3.5 cm at the level of the mid calf. -Heavy calcification of the posterior tibial artery noted.  *See table(s) above for measurements and observations. Electronically signed by Monica Martinez MD on 06/02/2020 at 6:24:25 PM.    Final       Subjective: No acute issues or events overnight   Discharge Exam: Vitals:   06/04/20 2042 06/05/20 0516  BP: (!) 125/52 (!) 99/57  Pulse: 80 81  Resp: 16 20  Temp: 98.1 F (36.7 C) 97.8 F (36.6 C)  SpO2: 98% 93%   Vitals:   06/04/20 0516 06/04/20 1334 06/04/20 2042 06/05/20 0516  BP: (!) 138/53 (!) 120/53 (!) 125/52 (!) 99/57  Pulse: 79 81 80 81  Resp: 20 15 16 20   Temp: (!) 97.5 F (36.4  C) 98.1 F (36.7 C) 98.1 F (36.7 C) 97.8 F (36.6 C)  TempSrc: Oral  Oral Oral  SpO2: 94% 97% 98% 93%  Weight:      Height:        General: Pt is alert, awake, not in acute distress Cardiovascular: RRR, S1/S2 +, no rubs, no gallops Respiratory: CTA bilaterally, no wheezing, no rhonchi Abdominal: Soft, NT, ND, bowel sounds + Extremities: no edema, no cyanosis    The results of significant diagnostics from this hospitalization (including imaging, microbiology, ancillary and laboratory) are listed below for reference.     Microbiology: Recent Results (from the past 240 hour(s))  Blood culture (routine x 2)     Status: None (Preliminary result)   Collection Time: 06/02/20 12:21 PM   Specimen: BLOOD  Result Value Ref Range Status   Specimen Description   Final    BLOOD LEFT ANTECUBITAL Performed at [facility] [address], [place], [place] [address]  Special Requests   Final    BOTTLES DRAWN AEROBIC AND ANAEROBIC Blood Culture adequate volume Performed at [facility] [address] [place], [place] [address]    Culture   Final    NO GROWTH 2 DAYS Performed at [facility] [address], [place], [place] [address]    Report Status PENDING  Incomplete  Blood culture (routine x 2)     Status: None (Preliminary result)   Collection Time: 06/02/20 12:21 PM   Specimen: BLOOD  Result Value Ref Range Status   Specimen Description   Final    BLOOD RIGHT ANTECUBITAL Performed at [facility] [address] [place], [place] [address]    Special Requests   Final    BOTTLES DRAWN AEROBIC AND ANAEROBIC Blood Culture adequate volume Performed at [facility] [address], [place], [place] [address]    Culture   Final    NO GROWTH 2 DAYS Performed at [facility] [address] [place], [place] [address]    Report Status PENDING  Incomplete  Resp Panel by RT-PCR (Flu A&B,  Covid) Nasopharyngeal Swab     Status: Abnormal   Collection Time: 06/02/20  1:36 PM   Specimen: Nasopharyngeal Swab; Nasopharyngeal(NP) swabs in vial transport medium  Result Value Ref Range Status   SARS Coronavirus 2 by RT PCR POSITIVE (A) NEGATIVE Final    Comment: RESULT CALLED TO, READ BACK BY AND VERIFIED WITH: WEST,S. RN @1446  ON 03.24.2022 BY COHEN,K (NOTE) SARS-CoV-2 target nucleic acids are DETECTED.  The SARS-CoV-2 RNA is generally detectable in upper respiratory specimens during the acute phase of infection. Positive results are indicative of the presence of the identified virus, but do not rule out bacterial infection or co-infection with other pathogens not detected by the test. Clinical correlation with patient history and other diagnostic information is necessary to determine patient infection status. The expected result is Negative.  Fact Sheet for Patients: EntrepreneurPulse.com.au  Fact Sheet for Healthcare Providers: IncredibleEmployment.be  This test is not yet approved or cleared by the [place] FDA and  has been authorized for detection and/or diagnosis of SARS-CoV-2 by FDA under an Emergency Use Authorization (EUA).  This EUA will remain in effect (meaning this tes t can be used) for the duration of  the COVID-19 declaration under Section 564(b)(1) of the Act, 21 U.S.C. section 360bbb-3(b)(1), unless the authorization is terminated or revoked sooner.     Influenza A by PCR NEGATIVE NEGATIVE Final   Influenza B by PCR NEGATIVE NEGATIVE Final    Comment: (NOTE) The Xpert Xpress SARS-CoV-2/FLU/RSV plus assay is intended as an aid in the diagnosis of influenza from Nasopharyngeal swab specimens and should not be used as a sole basis for treatment. Nasal washings and aspirates are unacceptable for Xpert Xpress SARS-CoV-2/FLU/RSV testing.  Fact Sheet for Patients: EntrepreneurPulse.com.au  Fact  Sheet for Healthcare Providers: IncredibleEmployment.be  This test is not yet approved or cleared by the [place] FDA and has been authorized for detection and/or diagnosis of SARS-CoV-2 by FDA under an Emergency Use Authorization (EUA). This EUA will remain in effect (meaning this test can be used) for the duration of the COVID-19 declaration under Section 564(b)(1) of the Act, 21 U.S.C. section 360bbb-3(b)(1), unless the authorization is terminated or revoked.  Performed at [facility], [place] [address] [place], [place] [address]      Labs: BNP (last 3 results) No results for input(s):  BNP in the last 8760 hours. Basic Metabolic Panel: Recent Labs  Lab 06/02/20 1221 06/03/20 0422 06/04/20 0358 06/05/20 0314  NA 138 139 138 137  K 3.8 4.6 3.7 4.1  CL 105 106 102 103  CO2 24 25 27 24   GLUCOSE 130* 246* 208* 193*  BUN 39* 27* 30* 30*  CREATININE 0.79 0.74 0.73 0.68  CALCIUM 9.0 9.1 9.1 8.5*   Liver Function Tests: Recent Labs  Lab 06/02/20 1221  AST 35  ALT 40  ALKPHOS 46  BILITOT 0.6  PROT 5.4*  ALBUMIN 3.1*   No results for input(s): LIPASE, AMYLASE in the last 168 hours. No results for input(s): AMMONIA in the last 168 hours. CBC: Recent Labs  Lab 06/02/20 1221 06/03/20 0422 06/04/20 0358 06/05/20 0314  WBC 9.3 8.3 7.7 9.1  NEUTROABS 7.7  --   --   --   HGB 9.2* 9.6* 10.0* 9.5*  HCT 29.8* 31.1* 32.0* 31.1*  MCV 89.8 90.4 90.7 92.6  PLT 243 266 296 311   Cardiac Enzymes: No results for input(s): CKTOTAL, CKMB, CKMBINDEX, TROPONINI in the last 168 hours. BNP: Invalid input(s): POCBNP CBG: No results for input(s): GLUCAP in the last 168 hours. D-Dimer Recent Labs    06/04/20 0358 06/05/20 0314  DDIMER 1.32* 2.30*   Hgb A1c No results for input(s): HGBA1C in the last 72 hours. Lipid Profile No results for input(s): CHOL, HDL, LDLCALC, TRIG, CHOLHDL, LDLDIRECT in the last 72 hours. Thyroid function  studies No results for input(s): TSH, T4TOTAL, T3FREE, THYROIDAB in the last 72 hours.  Invalid input(s): FREET3 Anemia work up Recent Labs    06/04/20 0358 06/05/20 0314  FERRITIN 23 21   Urinalysis    Component Value Date/Time   COLORURINE YELLOW 06/02/2020 2000   APPEARANCEUR CLEAR 06/02/2020 2000   LABSPEC 1.019 06/02/2020 2000   PHURINE 5.0 06/02/2020 2000   GLUCOSEU NEGATIVE 06/02/2020 2000   HGBUR MODERATE (A) 06/02/2020 2000   BILIRUBINUR NEGATIVE 06/02/2020 2000   KETONESUR 5 (A) 06/02/2020 2000   PROTEINUR NEGATIVE 06/02/2020 2000   UROBILINOGEN 0.2 09/11/2012 0927   NITRITE NEGATIVE 06/02/2020 2000   LEUKOCYTESUR SMALL (A) 06/02/2020 2000   Sepsis Labs Invalid input(s): PROCALCITONIN,  WBC,  LACTICIDVEN Microbiology Recent Results (from the past 240 hour(s))  Blood culture (routine x 2)     Status: None (Preliminary result)   Collection Time: 06/02/20 12:21 PM   Specimen: BLOOD  Result Value Ref Range Status   Specimen Description   Final    BLOOD LEFT ANTECUBITAL Performed at [facility], [place] [address] [place], [place] [address]    Special Requests   Final    BOTTLES DRAWN AEROBIC AND ANAEROBIC Blood Culture adequate volume Performed at [facility] [address] [place], [place] [address]    Culture   Final    NO GROWTH 2 DAYS Performed at [facility] [address] [place], [place] [address]    Report Status PENDING  Incomplete  Blood culture (routine x 2)     Status: None (Preliminary result)   Collection Time: 06/02/20 12:21 PM   Specimen: BLOOD  Result Value Ref Range Status   Specimen Description   Final    BLOOD RIGHT ANTECUBITAL Performed at [facility] [address], [place], [place] [address]    Special Requests   Final    BOTTLES DRAWN AEROBIC AND ANAEROBIC Blood Culture adequate volume Performed at [facility], [place] [address]  [place], [place] [address]    Culture   Final    NO GROWTH 2 DAYS Performed at [facility] [address], [place], [place] [address]    Report Status PENDING  Incomplete  Resp Panel by RT-PCR (Flu A&B, Covid) Nasopharyngeal Swab     Status: Abnormal   Collection Time: 06/02/20  1:36 PM   Specimen: Nasopharyngeal Swab; Nasopharyngeal(NP) swabs in vial transport medium  Result Value Ref Range Status   SARS Coronavirus 2 by RT PCR POSITIVE (A) NEGATIVE Final    Comment: RESULT CALLED TO, READ BACK BY AND VERIFIED WITH: WEST,S. RN @1446  ON 03.24.2022 BY COHEN,K (NOTE) SARS-CoV-2 target nucleic acids are DETECTED.  The SARS-CoV-2 RNA is generally detectable in upper respiratory specimens during the acute phase of infection. Positive results are indicative of the presence of the identified virus, but do not rule out bacterial infection or co-infection with other pathogens not detected by the test. Clinical correlation with patient history and other diagnostic information is necessary to determine patient infection status. The expected result is Negative.  Fact Sheet for Patients: EntrepreneurPulse.com.au  Fact Sheet for Healthcare Providers: IncredibleEmployment.be  This test is not yet approved or cleared by the [place] FDA and  has been authorized for detection and/or diagnosis of SARS-CoV-2 by FDA under an Emergency Use Authorization (EUA).  This EUA will remain in effect (meaning this tes t can be used) for the duration of  the COVID-19 declaration under Section 564(b)(1) of the Act, 21 U.S.C. section 360bbb-3(b)(1), unless the authorization is terminated or revoked sooner.     Influenza A by PCR NEGATIVE NEGATIVE Final   Influenza B by PCR NEGATIVE NEGATIVE Final    Comment: (NOTE) The Xpert Xpress SARS-CoV-2/FLU/RSV plus assay is intended as an aid in the diagnosis of influenza from Nasopharyngeal swab specimens and should not  be used as a sole basis for treatment. Nasal washings and aspirates are unacceptable for Xpert Xpress SARS-CoV-2/FLU/RSV testing.  Fact Sheet for Patients: EntrepreneurPulse.com.au  Fact Sheet for Healthcare Providers: IncredibleEmployment.be  This test is not yet approved or cleared by the [place] FDA and has been authorized for detection and/or diagnosis of SARS-CoV-2 by FDA under an Emergency Use Authorization (EUA). This EUA will remain in effect (meaning this test can be used) for the duration of the COVID-19 declaration under Section 564(b)(1) of the Act, 21 U.S.C. section 360bbb-3(b)(1), unless the authorization is terminated or revoked.  Performed at [facility], [place] [address] [place], [place] [address]      Time coordinating discharge: Over 30 minutes  SIGNED:   Little Ishikawa, DO [facility] 06/05/2020, 8:09 AM Pager   If 7PM-7AM, please contact night-coverage [email or web address]

## 2020-06-05 NOTE — Progress Notes (Signed)
Pt being discharged to [facility] via [facility]. Discharge information and medication education provided to pt and daughter at the bedside. RN attempted to call report to the facility 3 times and was transferred and the call was ended. RN notified [facility] and placed my direct number on discharge envelope for the facility to call when able.

## 2020-06-07 DIAGNOSIS — J449 Chronic obstructive pulmonary disease, unspecified: Secondary | ICD-10-CM | POA: Diagnosis not present

## 2020-06-07 DIAGNOSIS — K2100 Gastro-esophageal reflux disease with esophagitis, without bleeding: Secondary | ICD-10-CM | POA: Diagnosis not present

## 2020-06-07 DIAGNOSIS — K222 Esophageal obstruction: Secondary | ICD-10-CM | POA: Diagnosis not present

## 2020-06-07 DIAGNOSIS — J189 Pneumonia, unspecified organism: Secondary | ICD-10-CM | POA: Diagnosis not present

## 2020-06-07 LAB — CULTURE, BLOOD (ROUTINE X 2)
Culture: NO GROWTH
Culture: NO GROWTH
Special Requests: ADEQUATE
Special Requests: ADEQUATE

## 2020-06-09 DIAGNOSIS — E1159 Type 2 diabetes mellitus with other circulatory complications: Secondary | ICD-10-CM | POA: Diagnosis not present

## 2020-06-09 DIAGNOSIS — L89151 Pressure ulcer of sacral region, stage 1: Secondary | ICD-10-CM | POA: Diagnosis not present

## 2020-06-09 DIAGNOSIS — U071 COVID-19: Secondary | ICD-10-CM | POA: Diagnosis not present

## 2020-06-09 DIAGNOSIS — Z9189 Other specified personal risk factors, not elsewhere classified: Secondary | ICD-10-CM | POA: Diagnosis not present

## 2020-06-09 DIAGNOSIS — J984 Other disorders of lung: Secondary | ICD-10-CM | POA: Diagnosis not present

## 2020-06-09 DIAGNOSIS — K219 Gastro-esophageal reflux disease without esophagitis: Secondary | ICD-10-CM | POA: Diagnosis not present

## 2020-06-09 DIAGNOSIS — I152 Hypertension secondary to endocrine disorders: Secondary | ICD-10-CM | POA: Diagnosis not present

## 2020-06-13 DIAGNOSIS — R2681 Unsteadiness on feet: Secondary | ICD-10-CM | POA: Diagnosis not present

## 2020-06-13 DIAGNOSIS — K219 Gastro-esophageal reflux disease without esophagitis: Secondary | ICD-10-CM | POA: Diagnosis not present

## 2020-06-13 DIAGNOSIS — Z950 Presence of cardiac pacemaker: Secondary | ICD-10-CM | POA: Diagnosis not present

## 2020-06-13 DIAGNOSIS — I10 Essential (primary) hypertension: Secondary | ICD-10-CM | POA: Diagnosis not present

## 2020-06-13 DIAGNOSIS — I4891 Unspecified atrial fibrillation: Secondary | ICD-10-CM | POA: Diagnosis not present

## 2020-06-13 DIAGNOSIS — J449 Chronic obstructive pulmonary disease, unspecified: Secondary | ICD-10-CM | POA: Diagnosis not present

## 2020-06-13 DIAGNOSIS — F329 Major depressive disorder, single episode, unspecified: Secondary | ICD-10-CM | POA: Diagnosis not present

## 2020-06-13 DIAGNOSIS — Z8616 Personal history of COVID-19: Secondary | ICD-10-CM | POA: Diagnosis not present

## 2020-06-13 DIAGNOSIS — E440 Moderate protein-calorie malnutrition: Secondary | ICD-10-CM | POA: Diagnosis not present

## 2020-06-13 DIAGNOSIS — M6281 Muscle weakness (generalized): Secondary | ICD-10-CM | POA: Diagnosis not present

## 2020-06-13 DIAGNOSIS — E119 Type 2 diabetes mellitus without complications: Secondary | ICD-10-CM | POA: Diagnosis not present

## 2020-06-13 DIAGNOSIS — D649 Anemia, unspecified: Secondary | ICD-10-CM | POA: Diagnosis not present

## 2020-06-16 DIAGNOSIS — I4891 Unspecified atrial fibrillation: Secondary | ICD-10-CM | POA: Diagnosis not present

## 2020-06-16 DIAGNOSIS — R2681 Unsteadiness on feet: Secondary | ICD-10-CM | POA: Diagnosis not present

## 2020-06-16 DIAGNOSIS — Z8616 Personal history of COVID-19: Secondary | ICD-10-CM | POA: Diagnosis not present

## 2020-06-16 DIAGNOSIS — D649 Anemia, unspecified: Secondary | ICD-10-CM | POA: Diagnosis not present

## 2020-06-16 DIAGNOSIS — I10 Essential (primary) hypertension: Secondary | ICD-10-CM | POA: Diagnosis not present

## 2020-06-16 DIAGNOSIS — E119 Type 2 diabetes mellitus without complications: Secondary | ICD-10-CM | POA: Diagnosis not present

## 2020-06-16 DIAGNOSIS — E440 Moderate protein-calorie malnutrition: Secondary | ICD-10-CM | POA: Diagnosis not present

## 2020-06-16 DIAGNOSIS — M6281 Muscle weakness (generalized): Secondary | ICD-10-CM | POA: Diagnosis not present

## 2020-06-16 DIAGNOSIS — Z950 Presence of cardiac pacemaker: Secondary | ICD-10-CM | POA: Diagnosis not present

## 2020-06-16 DIAGNOSIS — F329 Major depressive disorder, single episode, unspecified: Secondary | ICD-10-CM | POA: Diagnosis not present

## 2020-06-16 DIAGNOSIS — K219 Gastro-esophageal reflux disease without esophagitis: Secondary | ICD-10-CM | POA: Diagnosis not present

## 2020-06-16 DIAGNOSIS — J449 Chronic obstructive pulmonary disease, unspecified: Secondary | ICD-10-CM | POA: Diagnosis not present

## 2020-06-20 DIAGNOSIS — Z950 Presence of cardiac pacemaker: Secondary | ICD-10-CM | POA: Diagnosis not present

## 2020-06-20 DIAGNOSIS — I10 Essential (primary) hypertension: Secondary | ICD-10-CM | POA: Diagnosis not present

## 2020-06-20 DIAGNOSIS — R2681 Unsteadiness on feet: Secondary | ICD-10-CM | POA: Diagnosis not present

## 2020-06-20 DIAGNOSIS — E119 Type 2 diabetes mellitus without complications: Secondary | ICD-10-CM | POA: Diagnosis not present

## 2020-06-20 DIAGNOSIS — K219 Gastro-esophageal reflux disease without esophagitis: Secondary | ICD-10-CM | POA: Diagnosis not present

## 2020-06-20 DIAGNOSIS — F329 Major depressive disorder, single episode, unspecified: Secondary | ICD-10-CM | POA: Diagnosis not present

## 2020-06-20 DIAGNOSIS — J449 Chronic obstructive pulmonary disease, unspecified: Secondary | ICD-10-CM | POA: Diagnosis not present

## 2020-06-20 DIAGNOSIS — E440 Moderate protein-calorie malnutrition: Secondary | ICD-10-CM | POA: Diagnosis not present

## 2020-06-20 DIAGNOSIS — D649 Anemia, unspecified: Secondary | ICD-10-CM | POA: Diagnosis not present

## 2020-06-20 DIAGNOSIS — I4891 Unspecified atrial fibrillation: Secondary | ICD-10-CM | POA: Diagnosis not present

## 2020-06-20 DIAGNOSIS — M6281 Muscle weakness (generalized): Secondary | ICD-10-CM | POA: Diagnosis not present

## 2020-06-20 DIAGNOSIS — Z8616 Personal history of COVID-19: Secondary | ICD-10-CM | POA: Diagnosis not present

## 2020-06-21 ENCOUNTER — Other Ambulatory Visit: Payer: Self-pay | Admitting: *Deleted

## 2020-06-21 DIAGNOSIS — Z8719 Personal history of other diseases of the digestive system: Secondary | ICD-10-CM | POA: Diagnosis not present

## 2020-06-21 DIAGNOSIS — K219 Gastro-esophageal reflux disease without esophagitis: Secondary | ICD-10-CM | POA: Diagnosis not present

## 2020-06-21 DIAGNOSIS — J188 Other pneumonia, unspecified organism: Secondary | ICD-10-CM | POA: Diagnosis not present

## 2020-06-21 DIAGNOSIS — D6489 Other specified anemias: Secondary | ICD-10-CM | POA: Diagnosis not present

## 2020-06-21 DIAGNOSIS — Z950 Presence of cardiac pacemaker: Secondary | ICD-10-CM | POA: Diagnosis not present

## 2020-06-21 DIAGNOSIS — J984 Other disorders of lung: Secondary | ICD-10-CM | POA: Diagnosis not present

## 2020-06-21 DIAGNOSIS — M542 Cervicalgia: Secondary | ICD-10-CM | POA: Diagnosis not present

## 2020-06-21 DIAGNOSIS — M1388 Other specified arthritis, other site: Secondary | ICD-10-CM | POA: Diagnosis not present

## 2020-06-21 DIAGNOSIS — I998 Other disorder of circulatory system: Secondary | ICD-10-CM | POA: Diagnosis not present

## 2020-06-21 DIAGNOSIS — J690 Pneumonitis due to inhalation of food and vomit: Secondary | ICD-10-CM | POA: Diagnosis not present

## 2020-06-21 DIAGNOSIS — R630 Anorexia: Secondary | ICD-10-CM | POA: Diagnosis not present

## 2020-06-21 DIAGNOSIS — E118 Type 2 diabetes mellitus with unspecified complications: Secondary | ICD-10-CM | POA: Diagnosis not present

## 2020-06-21 NOTE — Patient Outreach (Signed)
[facility] Coordinator follow up. Member screened for potential [facility] Care Management needs.  Mrs. Severino resides in [facility]. Update received from SNF SW indicating member will return to ALF upon SNF transition.   No identifiable [facility] Care Management needs at this time.    Marthenia Rolling, MSN, RN,BSN [facility] Acute Care Coordinator [phone] [facility]) [phone]  (Toll free office)

## 2020-06-22 ENCOUNTER — Ambulatory Visit: Payer: Medicare Other | Admitting: Neurology

## 2020-06-22 DIAGNOSIS — I152 Hypertension secondary to endocrine disorders: Secondary | ICD-10-CM | POA: Diagnosis not present

## 2020-06-22 DIAGNOSIS — J984 Other disorders of lung: Secondary | ICD-10-CM | POA: Diagnosis not present

## 2020-06-22 DIAGNOSIS — I495 Sick sinus syndrome: Secondary | ICD-10-CM | POA: Diagnosis not present

## 2020-06-22 DIAGNOSIS — E1159 Type 2 diabetes mellitus with other circulatory complications: Secondary | ICD-10-CM | POA: Diagnosis not present

## 2020-06-22 DIAGNOSIS — J188 Other pneumonia, unspecified organism: Secondary | ICD-10-CM | POA: Diagnosis not present

## 2020-06-22 DIAGNOSIS — U071 COVID-19: Secondary | ICD-10-CM | POA: Diagnosis not present

## 2020-06-23 DIAGNOSIS — E119 Type 2 diabetes mellitus without complications: Secondary | ICD-10-CM | POA: Diagnosis not present

## 2020-06-23 DIAGNOSIS — I10 Essential (primary) hypertension: Secondary | ICD-10-CM | POA: Diagnosis not present

## 2020-06-23 DIAGNOSIS — R2681 Unsteadiness on feet: Secondary | ICD-10-CM | POA: Diagnosis not present

## 2020-06-23 DIAGNOSIS — I4891 Unspecified atrial fibrillation: Secondary | ICD-10-CM | POA: Diagnosis not present

## 2020-06-23 DIAGNOSIS — Z950 Presence of cardiac pacemaker: Secondary | ICD-10-CM | POA: Diagnosis not present

## 2020-06-23 DIAGNOSIS — D649 Anemia, unspecified: Secondary | ICD-10-CM | POA: Diagnosis not present

## 2020-06-23 DIAGNOSIS — J449 Chronic obstructive pulmonary disease, unspecified: Secondary | ICD-10-CM | POA: Diagnosis not present

## 2020-06-23 DIAGNOSIS — K219 Gastro-esophageal reflux disease without esophagitis: Secondary | ICD-10-CM | POA: Diagnosis not present

## 2020-06-23 DIAGNOSIS — E440 Moderate protein-calorie malnutrition: Secondary | ICD-10-CM | POA: Diagnosis not present

## 2020-06-23 DIAGNOSIS — M6281 Muscle weakness (generalized): Secondary | ICD-10-CM | POA: Diagnosis not present

## 2020-06-23 DIAGNOSIS — Z8616 Personal history of COVID-19: Secondary | ICD-10-CM | POA: Diagnosis not present

## 2020-06-23 DIAGNOSIS — F329 Major depressive disorder, single episode, unspecified: Secondary | ICD-10-CM | POA: Diagnosis not present

## 2020-06-27 DIAGNOSIS — I10 Essential (primary) hypertension: Secondary | ICD-10-CM | POA: Diagnosis not present

## 2020-06-27 DIAGNOSIS — Z8616 Personal history of COVID-19: Secondary | ICD-10-CM | POA: Diagnosis not present

## 2020-06-27 DIAGNOSIS — K219 Gastro-esophageal reflux disease without esophagitis: Secondary | ICD-10-CM | POA: Diagnosis not present

## 2020-06-27 DIAGNOSIS — M6281 Muscle weakness (generalized): Secondary | ICD-10-CM | POA: Diagnosis not present

## 2020-06-27 DIAGNOSIS — E119 Type 2 diabetes mellitus without complications: Secondary | ICD-10-CM | POA: Diagnosis not present

## 2020-06-27 DIAGNOSIS — I4891 Unspecified atrial fibrillation: Secondary | ICD-10-CM | POA: Diagnosis not present

## 2020-06-27 DIAGNOSIS — R2681 Unsteadiness on feet: Secondary | ICD-10-CM | POA: Diagnosis not present

## 2020-06-27 DIAGNOSIS — Z950 Presence of cardiac pacemaker: Secondary | ICD-10-CM | POA: Diagnosis not present

## 2020-06-27 DIAGNOSIS — F329 Major depressive disorder, single episode, unspecified: Secondary | ICD-10-CM | POA: Diagnosis not present

## 2020-06-27 DIAGNOSIS — E440 Moderate protein-calorie malnutrition: Secondary | ICD-10-CM | POA: Diagnosis not present

## 2020-06-27 DIAGNOSIS — J449 Chronic obstructive pulmonary disease, unspecified: Secondary | ICD-10-CM | POA: Diagnosis not present

## 2020-06-27 DIAGNOSIS — D649 Anemia, unspecified: Secondary | ICD-10-CM | POA: Diagnosis not present

## 2020-06-28 DIAGNOSIS — R634 Abnormal weight loss: Secondary | ICD-10-CM | POA: Diagnosis not present

## 2020-06-28 DIAGNOSIS — E118 Type 2 diabetes mellitus with unspecified complications: Secondary | ICD-10-CM | POA: Diagnosis not present

## 2020-06-28 DIAGNOSIS — K257 Chronic gastric ulcer without hemorrhage or perforation: Secondary | ICD-10-CM | POA: Diagnosis not present

## 2020-06-28 DIAGNOSIS — D500 Iron deficiency anemia secondary to blood loss (chronic): Secondary | ICD-10-CM | POA: Diagnosis not present

## 2020-06-28 DIAGNOSIS — K449 Diaphragmatic hernia without obstruction or gangrene: Secondary | ICD-10-CM | POA: Diagnosis not present

## 2020-06-28 DIAGNOSIS — K5790 Diverticulosis of intestine, part unspecified, without perforation or abscess without bleeding: Secondary | ICD-10-CM | POA: Diagnosis not present

## 2020-06-29 DIAGNOSIS — E1159 Type 2 diabetes mellitus with other circulatory complications: Secondary | ICD-10-CM | POA: Diagnosis not present

## 2020-06-29 DIAGNOSIS — J690 Pneumonitis due to inhalation of food and vomit: Secondary | ICD-10-CM | POA: Diagnosis not present

## 2020-06-29 DIAGNOSIS — U071 COVID-19: Secondary | ICD-10-CM | POA: Diagnosis not present

## 2020-06-29 DIAGNOSIS — I495 Sick sinus syndrome: Secondary | ICD-10-CM | POA: Diagnosis not present

## 2020-07-07 DIAGNOSIS — R269 Unspecified abnormalities of gait and mobility: Secondary | ICD-10-CM | POA: Diagnosis not present

## 2020-07-07 DIAGNOSIS — Z9181 History of falling: Secondary | ICD-10-CM | POA: Diagnosis not present

## 2020-07-07 DIAGNOSIS — M6281 Muscle weakness (generalized): Secondary | ICD-10-CM | POA: Diagnosis not present

## 2020-07-07 DIAGNOSIS — Z741 Need for assistance with personal care: Secondary | ICD-10-CM | POA: Diagnosis not present

## 2020-07-07 DIAGNOSIS — R279 Unspecified lack of coordination: Secondary | ICD-10-CM | POA: Diagnosis not present

## 2020-07-07 DIAGNOSIS — R2681 Unsteadiness on feet: Secondary | ICD-10-CM | POA: Diagnosis not present

## 2020-07-07 DIAGNOSIS — M84653D Pathological fracture in other disease, unspecified femur, subsequent encounter for fracture with routine healing: Secondary | ICD-10-CM | POA: Diagnosis not present

## 2020-07-08 DIAGNOSIS — R279 Unspecified lack of coordination: Secondary | ICD-10-CM | POA: Diagnosis not present

## 2020-07-08 DIAGNOSIS — M6281 Muscle weakness (generalized): Secondary | ICD-10-CM | POA: Diagnosis not present

## 2020-07-08 DIAGNOSIS — R269 Unspecified abnormalities of gait and mobility: Secondary | ICD-10-CM | POA: Diagnosis not present

## 2020-07-08 DIAGNOSIS — R2681 Unsteadiness on feet: Secondary | ICD-10-CM | POA: Diagnosis not present

## 2020-07-08 DIAGNOSIS — Z741 Need for assistance with personal care: Secondary | ICD-10-CM | POA: Diagnosis not present

## 2020-07-08 DIAGNOSIS — Z9181 History of falling: Secondary | ICD-10-CM | POA: Diagnosis not present

## 2020-07-11 DIAGNOSIS — Z741 Need for assistance with personal care: Secondary | ICD-10-CM | POA: Diagnosis not present

## 2020-07-11 DIAGNOSIS — M4302 Spondylolysis, cervical region: Secondary | ICD-10-CM | POA: Diagnosis not present

## 2020-07-11 DIAGNOSIS — J4550 Severe persistent asthma, uncomplicated: Secondary | ICD-10-CM | POA: Diagnosis not present

## 2020-07-11 DIAGNOSIS — J189 Pneumonia, unspecified organism: Secondary | ICD-10-CM | POA: Diagnosis not present

## 2020-07-11 DIAGNOSIS — M6281 Muscle weakness (generalized): Secondary | ICD-10-CM | POA: Diagnosis not present

## 2020-07-11 DIAGNOSIS — R269 Unspecified abnormalities of gait and mobility: Secondary | ICD-10-CM | POA: Diagnosis not present

## 2020-07-11 DIAGNOSIS — E7800 Pure hypercholesterolemia, unspecified: Secondary | ICD-10-CM | POA: Diagnosis not present

## 2020-07-11 DIAGNOSIS — J449 Chronic obstructive pulmonary disease, unspecified: Secondary | ICD-10-CM | POA: Diagnosis not present

## 2020-07-11 DIAGNOSIS — I129 Hypertensive chronic kidney disease with stage 1 through stage 4 chronic kidney disease, or unspecified chronic kidney disease: Secondary | ICD-10-CM | POA: Diagnosis not present

## 2020-07-11 DIAGNOSIS — R2681 Unsteadiness on feet: Secondary | ICD-10-CM | POA: Diagnosis not present

## 2020-07-11 DIAGNOSIS — R531 Weakness: Secondary | ICD-10-CM | POA: Diagnosis not present

## 2020-07-11 DIAGNOSIS — N1831 Chronic kidney disease, stage 3a: Secondary | ICD-10-CM | POA: Diagnosis not present

## 2020-07-11 DIAGNOSIS — M8448XD Pathological fracture, other site, subsequent encounter for fracture with routine healing: Secondary | ICD-10-CM | POA: Diagnosis not present

## 2020-07-11 DIAGNOSIS — M84653D Pathological fracture in other disease, unspecified femur, subsequent encounter for fracture with routine healing: Secondary | ICD-10-CM | POA: Diagnosis not present

## 2020-07-11 DIAGNOSIS — Z9181 History of falling: Secondary | ICD-10-CM | POA: Diagnosis not present

## 2020-07-11 DIAGNOSIS — R600 Localized edema: Secondary | ICD-10-CM | POA: Diagnosis not present

## 2020-07-11 DIAGNOSIS — I700 Atherosclerosis of aorta: Secondary | ICD-10-CM | POA: Diagnosis not present

## 2020-07-11 DIAGNOSIS — E1129 Type 2 diabetes mellitus with other diabetic kidney complication: Secondary | ICD-10-CM | POA: Diagnosis not present

## 2020-07-11 DIAGNOSIS — R279 Unspecified lack of coordination: Secondary | ICD-10-CM | POA: Diagnosis not present

## 2020-07-14 DIAGNOSIS — Z741 Need for assistance with personal care: Secondary | ICD-10-CM | POA: Diagnosis not present

## 2020-07-14 DIAGNOSIS — R2681 Unsteadiness on feet: Secondary | ICD-10-CM | POA: Diagnosis not present

## 2020-07-14 DIAGNOSIS — R279 Unspecified lack of coordination: Secondary | ICD-10-CM | POA: Diagnosis not present

## 2020-07-14 DIAGNOSIS — M6281 Muscle weakness (generalized): Secondary | ICD-10-CM | POA: Diagnosis not present

## 2020-07-14 DIAGNOSIS — Z9181 History of falling: Secondary | ICD-10-CM | POA: Diagnosis not present

## 2020-07-14 DIAGNOSIS — R269 Unspecified abnormalities of gait and mobility: Secondary | ICD-10-CM | POA: Diagnosis not present

## 2020-07-15 DIAGNOSIS — R269 Unspecified abnormalities of gait and mobility: Secondary | ICD-10-CM | POA: Diagnosis not present

## 2020-07-15 DIAGNOSIS — Z741 Need for assistance with personal care: Secondary | ICD-10-CM | POA: Diagnosis not present

## 2020-07-15 DIAGNOSIS — R279 Unspecified lack of coordination: Secondary | ICD-10-CM | POA: Diagnosis not present

## 2020-07-15 DIAGNOSIS — M6281 Muscle weakness (generalized): Secondary | ICD-10-CM | POA: Diagnosis not present

## 2020-07-15 DIAGNOSIS — Z9181 History of falling: Secondary | ICD-10-CM | POA: Diagnosis not present

## 2020-07-15 DIAGNOSIS — R2681 Unsteadiness on feet: Secondary | ICD-10-CM | POA: Diagnosis not present

## 2020-07-18 DIAGNOSIS — Z741 Need for assistance with personal care: Secondary | ICD-10-CM | POA: Diagnosis not present

## 2020-07-18 DIAGNOSIS — M6281 Muscle weakness (generalized): Secondary | ICD-10-CM | POA: Diagnosis not present

## 2020-07-18 DIAGNOSIS — Z9181 History of falling: Secondary | ICD-10-CM | POA: Diagnosis not present

## 2020-07-18 DIAGNOSIS — R2681 Unsteadiness on feet: Secondary | ICD-10-CM | POA: Diagnosis not present

## 2020-07-18 DIAGNOSIS — R269 Unspecified abnormalities of gait and mobility: Secondary | ICD-10-CM | POA: Diagnosis not present

## 2020-07-18 DIAGNOSIS — R279 Unspecified lack of coordination: Secondary | ICD-10-CM | POA: Diagnosis not present

## 2020-07-19 DIAGNOSIS — R279 Unspecified lack of coordination: Secondary | ICD-10-CM | POA: Diagnosis not present

## 2020-07-19 DIAGNOSIS — Z9181 History of falling: Secondary | ICD-10-CM | POA: Diagnosis not present

## 2020-07-19 DIAGNOSIS — R269 Unspecified abnormalities of gait and mobility: Secondary | ICD-10-CM | POA: Diagnosis not present

## 2020-07-19 DIAGNOSIS — M6281 Muscle weakness (generalized): Secondary | ICD-10-CM | POA: Diagnosis not present

## 2020-07-19 DIAGNOSIS — Z741 Need for assistance with personal care: Secondary | ICD-10-CM | POA: Diagnosis not present

## 2020-07-19 DIAGNOSIS — R2681 Unsteadiness on feet: Secondary | ICD-10-CM | POA: Diagnosis not present

## 2020-07-20 ENCOUNTER — Encounter: Payer: Self-pay | Admitting: Neurology

## 2020-07-20 ENCOUNTER — Ambulatory Visit (INDEPENDENT_AMBULATORY_CARE_PROVIDER_SITE_OTHER): Payer: Medicare Other | Admitting: Neurology

## 2020-07-20 VITALS — BP 116/68 | HR 98 | Ht 65.0 in | Wt 121.5 lb

## 2020-07-20 DIAGNOSIS — G243 Spasmodic torticollis: Secondary | ICD-10-CM

## 2020-07-20 DIAGNOSIS — M542 Cervicalgia: Secondary | ICD-10-CM

## 2020-07-20 MED ORDER — ONABOTULINUMTOXINA 100 UNITS IJ SOLR
100.0000 [IU] | Freq: Once | INTRAMUSCULAR | Status: DC
Start: 2020-07-20 — End: 2020-10-26

## 2020-07-20 NOTE — Progress Notes (Signed)
PATIENT: Wendy Velasquez DOB: 08-03-1925  Chief Complaint  Patient presents with  . Procedure    Botox     HISTORICAL  Wendy Velasquez is a 87 years old female, seen in request by her primary care physician Dr. Osborne Casco, [place] Him for evaluation of neck pain, headaches, initial evaluation was on December 05, 2017.  She is accompanied by her daughter at today's clinical visit.  I have reviewed and summarized the referring note from the referring physician, she has past medical history of diabetes, use insulin, history of cardiac block, status post pacemaker, hyperlipidemia,  She had a history of chronic neck pain, had acute worsening since March 2018, she woke up one morning, noticed upper neck throbbing pain, radiating pain to her skull, has been persistent since then, over the last 1 year, she was seen by orthopedic PA, Ronette Deter, was treated with multiple trigger point injection, with mixture of 1% lidocaine, and Depo-Medrol, most recent injection was on October 10, 2017, on a monthly basis, patient denies significant improvement, also complains of worsening glucose control after injection,  She denies significant radiating pain to bilateral shoulder upper extremity, denies difficulty using arms, she had chronic urinary incontinence is receiving Botox injection by urologist, she contributed to her gait abnormality to her bilateral knee pain, chronic arthritis,  She now complains of difficulty turning her neck, 6 out of 10 constant deep achy pain, multiple tender spots at cervical, upper trapezius, oftentimes she has transient sharp radiating range of motion, difficulty turning her neck,  She also tried physical therapy, is doing morning neck traction regularly,  UPDATE Jan 02 2018: She continue complains of significant neck pain, 8 out of 10 sometimes, radiating pain to bilateral occipital region despite gabapentin 100 mg 5 tablets each day, I have personally reviewed CT cervical  spine on December 10, 2017, no acute abnormality, multilevel spondylolisthesis, maximum at C4-5, with associated   facet arthropathy, evidence of ankylosis, mild spinal stenosis at the C2-3, C3-4 level  UPDATE Apr 08 2018: She responded very well to her initial injection January 02, 2018, we used 100 units of Botox, a month after injection she was pain-free for 2 months, there was no significant side effect noticed, in the past 2 weeks, she noticed returning of her neck pain, centered at bilateral mastoid process, nuchal line, tenderness upon deep palpitation.  UPDATE August 12 2018: She responded very well to previous injection  UPDATE Sept 9 2020: She responded well to previous injection, we used Botox a 100 units, is take about 3 days for the benefit to kicking, lasting for 2 to 3 months, no significant side effect.  UPDATE Feb 25 2019: She responded well to previous injection  UPDATE June 01 2019: She suffered COVID-29 January 2020, required hospitalization but no intubation, she complains of increased bilateral upper back pain, neck pain.  Update September 09 2019 She complains of worsening neck pain, she was admitted to hospital in early February for acute hypoxic respiratory failure due to COVID-19 pneumonia, she was treated with remdesivir, did not require intubation, however, since the infection, she complains of declining of her status, could no longer walking, rely on wheelchair, fatigue,  UPDATE Set 29 2021: She is with her daughter at today's clinical visit, suffered pelvic fracture, Botox low dose 100 units continue to help her neck muscle pain, spasm  UPDATE Mar 23 2020: She is accompanied by her daughter at today's clinical visit, overall doing better, previous Botox injection 100  units has really helped her neck pain, is receiving physical therapy  Update Jul 20, 2020: She responded well to previous injection, recent hospital for aspiration pneumonia, complains of increased neck  pain, essentially wheelchair-bound  REVIEW OF SYSTEMS: Full 14 system review of systems performed and notable only for as above. ALLERGIES: No Known Allergies  HOME MEDICATIONS: Current Outpatient Medications  Medication Sig Dispense Refill  . acetaminophen (TYLENOL) 325 MG tablet Take 2 tablets (650 mg total) by mouth every 6 (six) hours as needed. (Patient taking differently: Take 650 mg by mouth every 6 (six) hours as needed for moderate pain.) 60 tablet 0  . albuterol (PROVENTIL) (2.5 MG/3ML) 0.083% nebulizer solution Take 3 mLs (2.5 mg total) by nebulization every 6 (six) hours as needed for wheezing or shortness of breath. 75 mL 0  . aspirin 81 MG chewable tablet Chew 81 mg by mouth daily.    Marland Kitchen azithromycin (ZITHROMAX) 250 MG tablet Take 1 tablet daily PO until completed 3 each 0  . botulinum toxin Type A (BOTOX) 100 units SOLR injection Inject 100 Units into the muscle every 3 (three) months.    . Brinzolamide-Brimonidine (SIMBRINZA) 1-0.2 % SUSP Apply 1 drop to eye at bedtime. (Patient taking differently: Place 1 drop into both eyes at bedtime.) 8 mL 0  . cetirizine (ZYRTEC) 10 MG tablet Take 1 tablet (10 mg total) by mouth daily as needed for allergies. (Patient taking differently: Take 10 mg by mouth daily.) 30 tablet 0  . Cholecalciferol (VITAMIN D) 50 MCG (2000 UT) tablet Take 2,000 Units by mouth daily.    Marland Kitchen esomeprazole (NEXIUM) 40 MG capsule Take 40 mg by mouth daily at 12 noon.    . famotidine (PEPCID) 20 MG tablet Take 20 mg by mouth daily as needed for heartburn or indigestion.    . fluticasone (FLONASE) 50 MCG/ACT nasal spray Place 2 sprays into both nostrils daily as needed for allergies. SHAKE AS DIRECTED 16 g 0  . Fluticasone-Salmeterol (ADVAIR) 250-50 MCG/DOSE AEPB Inhale 1 puff into the lungs 2 (two) times daily.    Marland Kitchen gabapentin (NEURONTIN) 100 MG capsule Take 1 capsule (100 mg total) by mouth at bedtime. 30 capsule 0  . ibandronate (BONIVA) 150 MG tablet Take 1 tablet  (150 mg total) by mouth every 30 (thirty) days. Take in the morning with a full glass of water, on an empty stomach, and do not take anything else by mouth or lie down for the next 30 min. 1 tablet 0  . Ipratropium-Albuterol (COMBIVENT RESPIMAT) 20-100 MCG/ACT AERS respimat Inhale 1 puff into the lungs every 8 (eight) hours as needed for wheezing. 4 g 0  . LANTUS SOLOSTAR 100 UNIT/ML Solostar Pen Inject 8 Units into the skin at bedtime.    Marland Kitchen latanoprost (XALATAN) 0.005 % ophthalmic solution Place 1 drop into both eyes at bedtime.    . lidocaine-prilocaine (EMLA) cream Apply 1 application topically 4 (four) times daily as needed. (Patient taking differently: Apply 1 application topically 4 (four) times daily as needed (pain).) 30 g 0  . Menthol, Topical Analgesic, (BIOFREEZE) 4 % GEL Apply 1 application topically daily as needed (pain). Apply to Right Leg.    . metFORMIN (GLUCOPHAGE) 1000 MG tablet Take 1 tablet (1,000 mg total) by mouth 2 (two) times daily with a meal. 60 tablet 0  . MYRBETRIQ 25 MG TB24 tablet Take 1 tablet (25 mg total) by mouth daily. 30 tablet 0  . sertraline (ZOLOFT) 50 MG tablet Take 1 tablet (  50 mg total) by mouth 2 (two) times daily. (Patient taking differently: Take 50 mg by mouth daily.) 60 tablet 0  . vitamin B-12 (CYANOCOBALAMIN) 1000 MCG tablet Take 1 tablet (1,000 mcg total) by mouth daily. 30 tablet 0   No current facility-administered medications for this visit.    PAST MEDICAL HISTORY: Past Medical History:  Diagnosis Date  . Asthma   . Cardiac pacemaker in situ   . Complete heart block ([place])   . COPD (chronic obstructive pulmonary disease) ([place])   . Diverticulosis of colon   . GERD (gastroesophageal reflux disease)   . Glaucoma of both eyes   . Heart disease   . History of cardiac arrest    09-11-2012--  symptomatic complete heart block -- hr 20's--  cpr, intubated and temp. pacer until  perm. pacemaker placement  . History of esophageal dilatation     for stricture 2007  . History of hiatal hernia   . History of iron deficiency anemia    hx transfusion's 15 yrs ago , approx 2001  . Hyperlipidemia   . Hypertension   . Neck pain   . Peripheral neuropathy   . Tachy-brady syndrome ([place])   . Type 2 diabetes mellitus ([place])   . Urgency incontinence    Botox injections for bladder.  . Wears glasses     PAST SURGICAL HISTORY: Past Surgical History:  Procedure Laterality Date  . ABDOMINAL HYSTERECTOMY  1976  approx   w/ unilateral salpingoophorectomy  . CATARACT EXTRACTION W/ INTRAOCULAR LENS  IMPLANT, BILATERAL  1990  . COLONOSCOPY WITH ESOPHAGOGASTRODUODENOSCOPY (EGD)  last one 2007  . INTERSTIM IMPLANT PLACEMENT  2012  . INTERSTIM IMPLANT REMOVAL N/A 09/21/2014   Procedure: REMOVAL OF INTERSTIM IMPLANT;  Surgeon: Bjorn Loser, MD;  [facility];  Service: Urology;  Laterality: N/A;  . INTERSTIM IMPLANT REVISION N/A 09/21/2014   Procedure: [place] STAGE ONE AND TWO;  Surgeon: Bjorn Loser, MD;  Location[facility];  Service: Urology;  Laterality: N/A;  . LAPAROTOMY W/ UNILATERAL SALPINGOOPHORECTOMY  1960's  approx  . PERMANENT PACEMAKER INSERTION N/A 09/11/2012   Procedure: PERMANENT PACEMAKER INSERTION;  Surgeon: Evans Lance, MD;  Location: [facility] CATH LAB;  Service: Cardiovascular;  Laterality: N/A;  St. Jude Dual Chamber  . RECTOCELE REPAIR  1991  approx    FAMILY HISTORY: Family History  Problem Relation Age of Onset  . Other Mother        died during her childbirth  . Prostatitis Father   . Heart failure Sister   . Breast cancer Sister   . Diabetic kidney disease Sister   . Melanoma Brother     SOCIAL HISTORY: Social History   Socioeconomic History  . Marital status: Married    Spouse name: Not on file  . Number of children: 3  . Years of education: 39  . Highest education level: High school graduate  Occupational History  . Occupation: Retired  Tobacco Use  .  Smoking status: Never Smoker  . Smokeless tobacco: Never Used  Vaping Use  . Vaping Use: Never used  Substance and Sexual Activity  . Alcohol use: No  . Drug use: No  . Sexual activity: Not Currently    Birth control/protection: None  Other Topics Concern  . Not on file  Social History Narrative   Lives with her husband in an independent living facility.   Right-handed.   1-2 cup caffeine daily.   Social Determinants of Health  Financial Resource Strain: Not on file  Food Insecurity: Not on file  Transportation Needs: Not on file  Physical Activity: Not on file  Stress: Not on file  Social Connections: Not on file  Intimate Partner Violence: Not on file     PHYSICAL EXAM  NEUROLOGICAL EXAM: Multiple posterior neck muscle spasm, painful upon deep palpitation, especially at bilateral levator scapula, latissimus longus, scapular cervix, tendency to shrug bilateral shoulders  DIAGNOSTIC DATA (LABS, IMAGING, TESTING) - I reviewed patient records, labs, notes, testing and imaging myself where available.   ASSESSMENT AND PLAN  CHI WOODHAM is a 87 y.o. female    Chronic neck pain, headaches Abnormal neck posturing, mild retrocollis, left tilt, significant tenderness at bilateral lateral cervical paraspinal muscles, bilateral levator scapular, tendency to shrug bilateral shoulders  Worsening gait abnormalities  She has significant hyperreflexia on examination,  CT cervical showed evidence of multilevel spondylolisthesis, maximum at C4-5 with associated facet arthropathy, developing ankylosis  Patient and her family do not want pursue further evaluation at this point   EMG guided Botox injection Used 100 units of Botox A    Right longissimus capitis 12.5 units Right splenius cervix 12.5 Right semispinalis 12.5 units Right levator scapular 12.5 units  Left longissimus capitis 25 units Left splenius cervix 12.5 units Left semispinalis 12.5 units Left levator  scapular 12.5 units     Marcial Pacas, M.D. Ph.D.  [facility] [address], [place], [place] [address] Ph: [phone] Fax: [phone]

## 2020-07-20 NOTE — Progress Notes (Signed)
**  Botox 100 units x 1 vial, NDC 6720-9470-96, Lot [id number], Exp 07/2022, office supply.//mck,rn**

## 2020-07-21 DIAGNOSIS — Z9181 History of falling: Secondary | ICD-10-CM | POA: Diagnosis not present

## 2020-07-21 DIAGNOSIS — R269 Unspecified abnormalities of gait and mobility: Secondary | ICD-10-CM | POA: Diagnosis not present

## 2020-07-21 DIAGNOSIS — R2681 Unsteadiness on feet: Secondary | ICD-10-CM | POA: Diagnosis not present

## 2020-07-21 DIAGNOSIS — R279 Unspecified lack of coordination: Secondary | ICD-10-CM | POA: Diagnosis not present

## 2020-07-21 DIAGNOSIS — M6281 Muscle weakness (generalized): Secondary | ICD-10-CM | POA: Diagnosis not present

## 2020-07-21 DIAGNOSIS — Z741 Need for assistance with personal care: Secondary | ICD-10-CM | POA: Diagnosis not present

## 2020-07-22 DIAGNOSIS — M6281 Muscle weakness (generalized): Secondary | ICD-10-CM | POA: Diagnosis not present

## 2020-07-22 DIAGNOSIS — R279 Unspecified lack of coordination: Secondary | ICD-10-CM | POA: Diagnosis not present

## 2020-07-22 DIAGNOSIS — R269 Unspecified abnormalities of gait and mobility: Secondary | ICD-10-CM | POA: Diagnosis not present

## 2020-07-22 DIAGNOSIS — Z741 Need for assistance with personal care: Secondary | ICD-10-CM | POA: Diagnosis not present

## 2020-07-22 DIAGNOSIS — Z9181 History of falling: Secondary | ICD-10-CM | POA: Diagnosis not present

## 2020-07-22 DIAGNOSIS — R2681 Unsteadiness on feet: Secondary | ICD-10-CM | POA: Diagnosis not present

## 2020-07-25 DIAGNOSIS — R269 Unspecified abnormalities of gait and mobility: Secondary | ICD-10-CM | POA: Diagnosis not present

## 2020-07-25 DIAGNOSIS — R279 Unspecified lack of coordination: Secondary | ICD-10-CM | POA: Diagnosis not present

## 2020-07-25 DIAGNOSIS — Z741 Need for assistance with personal care: Secondary | ICD-10-CM | POA: Diagnosis not present

## 2020-07-25 DIAGNOSIS — R2681 Unsteadiness on feet: Secondary | ICD-10-CM | POA: Diagnosis not present

## 2020-07-25 DIAGNOSIS — Z9181 History of falling: Secondary | ICD-10-CM | POA: Diagnosis not present

## 2020-07-25 DIAGNOSIS — M6281 Muscle weakness (generalized): Secondary | ICD-10-CM | POA: Diagnosis not present

## 2020-07-26 DIAGNOSIS — R600 Localized edema: Secondary | ICD-10-CM | POA: Diagnosis not present

## 2020-07-26 DIAGNOSIS — J449 Chronic obstructive pulmonary disease, unspecified: Secondary | ICD-10-CM | POA: Diagnosis not present

## 2020-07-26 DIAGNOSIS — E1129 Type 2 diabetes mellitus with other diabetic kidney complication: Secondary | ICD-10-CM | POA: Diagnosis not present

## 2020-07-26 DIAGNOSIS — I129 Hypertensive chronic kidney disease with stage 1 through stage 4 chronic kidney disease, or unspecified chronic kidney disease: Secondary | ICD-10-CM | POA: Diagnosis not present

## 2020-07-26 DIAGNOSIS — R531 Weakness: Secondary | ICD-10-CM | POA: Diagnosis not present

## 2020-07-26 DIAGNOSIS — J4550 Severe persistent asthma, uncomplicated: Secondary | ICD-10-CM | POA: Diagnosis not present

## 2020-07-26 DIAGNOSIS — M4302 Spondylolysis, cervical region: Secondary | ICD-10-CM | POA: Diagnosis not present

## 2020-07-27 DIAGNOSIS — Z741 Need for assistance with personal care: Secondary | ICD-10-CM | POA: Diagnosis not present

## 2020-07-27 DIAGNOSIS — R269 Unspecified abnormalities of gait and mobility: Secondary | ICD-10-CM | POA: Diagnosis not present

## 2020-07-27 DIAGNOSIS — M6281 Muscle weakness (generalized): Secondary | ICD-10-CM | POA: Diagnosis not present

## 2020-07-27 DIAGNOSIS — R2681 Unsteadiness on feet: Secondary | ICD-10-CM | POA: Diagnosis not present

## 2020-07-27 DIAGNOSIS — R279 Unspecified lack of coordination: Secondary | ICD-10-CM | POA: Diagnosis not present

## 2020-07-27 DIAGNOSIS — Z9181 History of falling: Secondary | ICD-10-CM | POA: Diagnosis not present

## 2020-07-28 DIAGNOSIS — M6281 Muscle weakness (generalized): Secondary | ICD-10-CM | POA: Diagnosis not present

## 2020-07-28 DIAGNOSIS — R269 Unspecified abnormalities of gait and mobility: Secondary | ICD-10-CM | POA: Diagnosis not present

## 2020-07-28 DIAGNOSIS — R279 Unspecified lack of coordination: Secondary | ICD-10-CM | POA: Diagnosis not present

## 2020-07-28 DIAGNOSIS — Z9181 History of falling: Secondary | ICD-10-CM | POA: Diagnosis not present

## 2020-07-28 DIAGNOSIS — R2681 Unsteadiness on feet: Secondary | ICD-10-CM | POA: Diagnosis not present

## 2020-07-28 DIAGNOSIS — Z741 Need for assistance with personal care: Secondary | ICD-10-CM | POA: Diagnosis not present

## 2020-07-29 DIAGNOSIS — R269 Unspecified abnormalities of gait and mobility: Secondary | ICD-10-CM | POA: Diagnosis not present

## 2020-07-29 DIAGNOSIS — Z9181 History of falling: Secondary | ICD-10-CM | POA: Diagnosis not present

## 2020-07-29 DIAGNOSIS — R2681 Unsteadiness on feet: Secondary | ICD-10-CM | POA: Diagnosis not present

## 2020-07-29 DIAGNOSIS — M6281 Muscle weakness (generalized): Secondary | ICD-10-CM | POA: Diagnosis not present

## 2020-07-29 DIAGNOSIS — R279 Unspecified lack of coordination: Secondary | ICD-10-CM | POA: Diagnosis not present

## 2020-07-29 DIAGNOSIS — Z741 Need for assistance with personal care: Secondary | ICD-10-CM | POA: Diagnosis not present

## 2020-08-01 DIAGNOSIS — M6281 Muscle weakness (generalized): Secondary | ICD-10-CM | POA: Diagnosis not present

## 2020-08-01 DIAGNOSIS — R279 Unspecified lack of coordination: Secondary | ICD-10-CM | POA: Diagnosis not present

## 2020-08-01 DIAGNOSIS — R2681 Unsteadiness on feet: Secondary | ICD-10-CM | POA: Diagnosis not present

## 2020-08-01 DIAGNOSIS — Z9181 History of falling: Secondary | ICD-10-CM | POA: Diagnosis not present

## 2020-08-01 DIAGNOSIS — R269 Unspecified abnormalities of gait and mobility: Secondary | ICD-10-CM | POA: Diagnosis not present

## 2020-08-01 DIAGNOSIS — Z741 Need for assistance with personal care: Secondary | ICD-10-CM | POA: Diagnosis not present

## 2020-08-02 DIAGNOSIS — Z9181 History of falling: Secondary | ICD-10-CM | POA: Diagnosis not present

## 2020-08-02 DIAGNOSIS — R279 Unspecified lack of coordination: Secondary | ICD-10-CM | POA: Diagnosis not present

## 2020-08-02 DIAGNOSIS — Z741 Need for assistance with personal care: Secondary | ICD-10-CM | POA: Diagnosis not present

## 2020-08-02 DIAGNOSIS — R2681 Unsteadiness on feet: Secondary | ICD-10-CM | POA: Diagnosis not present

## 2020-08-02 DIAGNOSIS — R269 Unspecified abnormalities of gait and mobility: Secondary | ICD-10-CM | POA: Diagnosis not present

## 2020-08-02 DIAGNOSIS — M6281 Muscle weakness (generalized): Secondary | ICD-10-CM | POA: Diagnosis not present

## 2020-08-03 DIAGNOSIS — R269 Unspecified abnormalities of gait and mobility: Secondary | ICD-10-CM | POA: Diagnosis not present

## 2020-08-03 DIAGNOSIS — R279 Unspecified lack of coordination: Secondary | ICD-10-CM | POA: Diagnosis not present

## 2020-08-03 DIAGNOSIS — R2681 Unsteadiness on feet: Secondary | ICD-10-CM | POA: Diagnosis not present

## 2020-08-03 DIAGNOSIS — M6281 Muscle weakness (generalized): Secondary | ICD-10-CM | POA: Diagnosis not present

## 2020-08-03 DIAGNOSIS — Z9181 History of falling: Secondary | ICD-10-CM | POA: Diagnosis not present

## 2020-08-03 DIAGNOSIS — Z741 Need for assistance with personal care: Secondary | ICD-10-CM | POA: Diagnosis not present

## 2020-08-05 DIAGNOSIS — R279 Unspecified lack of coordination: Secondary | ICD-10-CM | POA: Diagnosis not present

## 2020-08-05 DIAGNOSIS — Z741 Need for assistance with personal care: Secondary | ICD-10-CM | POA: Diagnosis not present

## 2020-08-05 DIAGNOSIS — R2681 Unsteadiness on feet: Secondary | ICD-10-CM | POA: Diagnosis not present

## 2020-08-05 DIAGNOSIS — M6281 Muscle weakness (generalized): Secondary | ICD-10-CM | POA: Diagnosis not present

## 2020-08-05 DIAGNOSIS — Z9181 History of falling: Secondary | ICD-10-CM | POA: Diagnosis not present

## 2020-08-05 DIAGNOSIS — R269 Unspecified abnormalities of gait and mobility: Secondary | ICD-10-CM | POA: Diagnosis not present

## 2020-08-09 DIAGNOSIS — Z9181 History of falling: Secondary | ICD-10-CM | POA: Diagnosis not present

## 2020-08-09 DIAGNOSIS — R279 Unspecified lack of coordination: Secondary | ICD-10-CM | POA: Diagnosis not present

## 2020-08-09 DIAGNOSIS — R269 Unspecified abnormalities of gait and mobility: Secondary | ICD-10-CM | POA: Diagnosis not present

## 2020-08-09 DIAGNOSIS — M6281 Muscle weakness (generalized): Secondary | ICD-10-CM | POA: Diagnosis not present

## 2020-08-09 DIAGNOSIS — Z741 Need for assistance with personal care: Secondary | ICD-10-CM | POA: Diagnosis not present

## 2020-08-09 DIAGNOSIS — R2681 Unsteadiness on feet: Secondary | ICD-10-CM | POA: Diagnosis not present

## 2020-08-10 DIAGNOSIS — M84653D Pathological fracture in other disease, unspecified femur, subsequent encounter for fracture with routine healing: Secondary | ICD-10-CM | POA: Diagnosis not present

## 2020-08-10 DIAGNOSIS — R279 Unspecified lack of coordination: Secondary | ICD-10-CM | POA: Diagnosis not present

## 2020-08-10 DIAGNOSIS — R269 Unspecified abnormalities of gait and mobility: Secondary | ICD-10-CM | POA: Diagnosis not present

## 2020-08-10 DIAGNOSIS — Z741 Need for assistance with personal care: Secondary | ICD-10-CM | POA: Diagnosis not present

## 2020-08-10 DIAGNOSIS — M6281 Muscle weakness (generalized): Secondary | ICD-10-CM | POA: Diagnosis not present

## 2020-08-10 DIAGNOSIS — R2681 Unsteadiness on feet: Secondary | ICD-10-CM | POA: Diagnosis not present

## 2020-08-10 DIAGNOSIS — Z9181 History of falling: Secondary | ICD-10-CM | POA: Diagnosis not present

## 2020-08-11 DIAGNOSIS — R269 Unspecified abnormalities of gait and mobility: Secondary | ICD-10-CM | POA: Diagnosis not present

## 2020-08-11 DIAGNOSIS — Z741 Need for assistance with personal care: Secondary | ICD-10-CM | POA: Diagnosis not present

## 2020-08-11 DIAGNOSIS — R279 Unspecified lack of coordination: Secondary | ICD-10-CM | POA: Diagnosis not present

## 2020-08-11 DIAGNOSIS — Z9181 History of falling: Secondary | ICD-10-CM | POA: Diagnosis not present

## 2020-08-11 DIAGNOSIS — M6281 Muscle weakness (generalized): Secondary | ICD-10-CM | POA: Diagnosis not present

## 2020-08-11 DIAGNOSIS — R2681 Unsteadiness on feet: Secondary | ICD-10-CM | POA: Diagnosis not present

## 2020-08-12 ENCOUNTER — Ambulatory Visit (INDEPENDENT_AMBULATORY_CARE_PROVIDER_SITE_OTHER): Payer: Medicare Other

## 2020-08-12 DIAGNOSIS — I442 Atrioventricular block, complete: Secondary | ICD-10-CM

## 2020-08-12 LAB — CUP PACEART REMOTE DEVICE CHECK
Battery Remaining Longevity: 35 mo
Battery Remaining Percentage: 29 %
Battery Voltage: 2.81 V
Brady Statistic AP VP Percent: 1 %
Brady Statistic AP VS Percent: 1 %
Brady Statistic AS VP Percent: 99 %
Brady Statistic AS VS Percent: 1 %
Brady Statistic RA Percent Paced: 1 %
Brady Statistic RV Percent Paced: 99 %
Date Time Interrogation Session: 20220603040018
Implantable Lead Implant Date: 20140703
Implantable Lead Implant Date: 20140703
Implantable Lead Location: 753859
Implantable Lead Location: 753860
Implantable Pulse Generator Implant Date: 20140703
Lead Channel Impedance Value: 440 Ohm
Lead Channel Impedance Value: 640 Ohm
Lead Channel Pacing Threshold Amplitude: 0.5 V
Lead Channel Pacing Threshold Amplitude: 1.25 V
Lead Channel Pacing Threshold Pulse Width: 0.4 ms
Lead Channel Pacing Threshold Pulse Width: 0.4 ms
Lead Channel Sensing Intrinsic Amplitude: 1.7 mV
Lead Channel Sensing Intrinsic Amplitude: 12 mV
Lead Channel Setting Pacing Amplitude: 1.5 V
Lead Channel Setting Pacing Amplitude: 2 V
Lead Channel Setting Pacing Pulse Width: 0.4 ms
Lead Channel Setting Sensing Sensitivity: 12.5 mV
Pulse Gen Model: 2240
Pulse Gen Serial Number: 7522008

## 2020-08-15 DIAGNOSIS — R279 Unspecified lack of coordination: Secondary | ICD-10-CM | POA: Diagnosis not present

## 2020-08-15 DIAGNOSIS — M6281 Muscle weakness (generalized): Secondary | ICD-10-CM | POA: Diagnosis not present

## 2020-08-15 DIAGNOSIS — Z9181 History of falling: Secondary | ICD-10-CM | POA: Diagnosis not present

## 2020-08-15 DIAGNOSIS — Z741 Need for assistance with personal care: Secondary | ICD-10-CM | POA: Diagnosis not present

## 2020-08-15 DIAGNOSIS — R269 Unspecified abnormalities of gait and mobility: Secondary | ICD-10-CM | POA: Diagnosis not present

## 2020-08-15 DIAGNOSIS — R2681 Unsteadiness on feet: Secondary | ICD-10-CM | POA: Diagnosis not present

## 2020-08-16 DIAGNOSIS — R279 Unspecified lack of coordination: Secondary | ICD-10-CM | POA: Diagnosis not present

## 2020-08-16 DIAGNOSIS — R2681 Unsteadiness on feet: Secondary | ICD-10-CM | POA: Diagnosis not present

## 2020-08-16 DIAGNOSIS — Z741 Need for assistance with personal care: Secondary | ICD-10-CM | POA: Diagnosis not present

## 2020-08-16 DIAGNOSIS — R269 Unspecified abnormalities of gait and mobility: Secondary | ICD-10-CM | POA: Diagnosis not present

## 2020-08-16 DIAGNOSIS — Z9181 History of falling: Secondary | ICD-10-CM | POA: Diagnosis not present

## 2020-08-16 DIAGNOSIS — M6281 Muscle weakness (generalized): Secondary | ICD-10-CM | POA: Diagnosis not present

## 2020-08-17 DIAGNOSIS — R279 Unspecified lack of coordination: Secondary | ICD-10-CM | POA: Diagnosis not present

## 2020-08-17 DIAGNOSIS — R269 Unspecified abnormalities of gait and mobility: Secondary | ICD-10-CM | POA: Diagnosis not present

## 2020-08-17 DIAGNOSIS — Z9181 History of falling: Secondary | ICD-10-CM | POA: Diagnosis not present

## 2020-08-17 DIAGNOSIS — Z741 Need for assistance with personal care: Secondary | ICD-10-CM | POA: Diagnosis not present

## 2020-08-17 DIAGNOSIS — M6281 Muscle weakness (generalized): Secondary | ICD-10-CM | POA: Diagnosis not present

## 2020-08-17 DIAGNOSIS — R2681 Unsteadiness on feet: Secondary | ICD-10-CM | POA: Diagnosis not present

## 2020-08-18 DIAGNOSIS — Z9181 History of falling: Secondary | ICD-10-CM | POA: Diagnosis not present

## 2020-08-18 DIAGNOSIS — Z741 Need for assistance with personal care: Secondary | ICD-10-CM | POA: Diagnosis not present

## 2020-08-18 DIAGNOSIS — R279 Unspecified lack of coordination: Secondary | ICD-10-CM | POA: Diagnosis not present

## 2020-08-18 DIAGNOSIS — M6281 Muscle weakness (generalized): Secondary | ICD-10-CM | POA: Diagnosis not present

## 2020-08-18 DIAGNOSIS — R269 Unspecified abnormalities of gait and mobility: Secondary | ICD-10-CM | POA: Diagnosis not present

## 2020-08-18 DIAGNOSIS — R2681 Unsteadiness on feet: Secondary | ICD-10-CM | POA: Diagnosis not present

## 2020-08-19 DIAGNOSIS — Z9181 History of falling: Secondary | ICD-10-CM | POA: Diagnosis not present

## 2020-08-19 DIAGNOSIS — Z741 Need for assistance with personal care: Secondary | ICD-10-CM | POA: Diagnosis not present

## 2020-08-19 DIAGNOSIS — M6281 Muscle weakness (generalized): Secondary | ICD-10-CM | POA: Diagnosis not present

## 2020-08-19 DIAGNOSIS — R269 Unspecified abnormalities of gait and mobility: Secondary | ICD-10-CM | POA: Diagnosis not present

## 2020-08-19 DIAGNOSIS — R279 Unspecified lack of coordination: Secondary | ICD-10-CM | POA: Diagnosis not present

## 2020-08-19 DIAGNOSIS — R2681 Unsteadiness on feet: Secondary | ICD-10-CM | POA: Diagnosis not present

## 2020-08-22 DIAGNOSIS — R2681 Unsteadiness on feet: Secondary | ICD-10-CM | POA: Diagnosis not present

## 2020-08-22 DIAGNOSIS — M6281 Muscle weakness (generalized): Secondary | ICD-10-CM | POA: Diagnosis not present

## 2020-08-22 DIAGNOSIS — R269 Unspecified abnormalities of gait and mobility: Secondary | ICD-10-CM | POA: Diagnosis not present

## 2020-08-22 DIAGNOSIS — Z741 Need for assistance with personal care: Secondary | ICD-10-CM | POA: Diagnosis not present

## 2020-08-22 DIAGNOSIS — Z9181 History of falling: Secondary | ICD-10-CM | POA: Diagnosis not present

## 2020-08-22 DIAGNOSIS — R279 Unspecified lack of coordination: Secondary | ICD-10-CM | POA: Diagnosis not present

## 2020-08-24 DIAGNOSIS — Z741 Need for assistance with personal care: Secondary | ICD-10-CM | POA: Diagnosis not present

## 2020-08-24 DIAGNOSIS — R2681 Unsteadiness on feet: Secondary | ICD-10-CM | POA: Diagnosis not present

## 2020-08-24 DIAGNOSIS — M6281 Muscle weakness (generalized): Secondary | ICD-10-CM | POA: Diagnosis not present

## 2020-08-24 DIAGNOSIS — Z9181 History of falling: Secondary | ICD-10-CM | POA: Diagnosis not present

## 2020-08-24 DIAGNOSIS — R279 Unspecified lack of coordination: Secondary | ICD-10-CM | POA: Diagnosis not present

## 2020-08-24 DIAGNOSIS — R269 Unspecified abnormalities of gait and mobility: Secondary | ICD-10-CM | POA: Diagnosis not present

## 2020-08-26 DIAGNOSIS — R269 Unspecified abnormalities of gait and mobility: Secondary | ICD-10-CM | POA: Diagnosis not present

## 2020-08-26 DIAGNOSIS — Z9181 History of falling: Secondary | ICD-10-CM | POA: Diagnosis not present

## 2020-08-26 DIAGNOSIS — Z741 Need for assistance with personal care: Secondary | ICD-10-CM | POA: Diagnosis not present

## 2020-08-26 DIAGNOSIS — R2681 Unsteadiness on feet: Secondary | ICD-10-CM | POA: Diagnosis not present

## 2020-08-26 DIAGNOSIS — M6281 Muscle weakness (generalized): Secondary | ICD-10-CM | POA: Diagnosis not present

## 2020-08-26 DIAGNOSIS — R279 Unspecified lack of coordination: Secondary | ICD-10-CM | POA: Diagnosis not present

## 2020-08-29 DIAGNOSIS — Z9181 History of falling: Secondary | ICD-10-CM | POA: Diagnosis not present

## 2020-08-29 DIAGNOSIS — R2681 Unsteadiness on feet: Secondary | ICD-10-CM | POA: Diagnosis not present

## 2020-08-29 DIAGNOSIS — R269 Unspecified abnormalities of gait and mobility: Secondary | ICD-10-CM | POA: Diagnosis not present

## 2020-08-29 DIAGNOSIS — Z741 Need for assistance with personal care: Secondary | ICD-10-CM | POA: Diagnosis not present

## 2020-08-29 DIAGNOSIS — M6281 Muscle weakness (generalized): Secondary | ICD-10-CM | POA: Diagnosis not present

## 2020-08-29 DIAGNOSIS — R279 Unspecified lack of coordination: Secondary | ICD-10-CM | POA: Diagnosis not present

## 2020-08-30 NOTE — Progress Notes (Signed)
Remote pacemaker transmission.   

## 2020-08-31 DIAGNOSIS — R269 Unspecified abnormalities of gait and mobility: Secondary | ICD-10-CM | POA: Diagnosis not present

## 2020-08-31 DIAGNOSIS — Z9181 History of falling: Secondary | ICD-10-CM | POA: Diagnosis not present

## 2020-08-31 DIAGNOSIS — R2681 Unsteadiness on feet: Secondary | ICD-10-CM | POA: Diagnosis not present

## 2020-08-31 DIAGNOSIS — R279 Unspecified lack of coordination: Secondary | ICD-10-CM | POA: Diagnosis not present

## 2020-08-31 DIAGNOSIS — M6281 Muscle weakness (generalized): Secondary | ICD-10-CM | POA: Diagnosis not present

## 2020-08-31 DIAGNOSIS — Z741 Need for assistance with personal care: Secondary | ICD-10-CM | POA: Diagnosis not present

## 2020-09-01 DIAGNOSIS — Z9181 History of falling: Secondary | ICD-10-CM | POA: Diagnosis not present

## 2020-09-01 DIAGNOSIS — M6281 Muscle weakness (generalized): Secondary | ICD-10-CM | POA: Diagnosis not present

## 2020-09-01 DIAGNOSIS — R269 Unspecified abnormalities of gait and mobility: Secondary | ICD-10-CM | POA: Diagnosis not present

## 2020-09-01 DIAGNOSIS — R2681 Unsteadiness on feet: Secondary | ICD-10-CM | POA: Diagnosis not present

## 2020-09-01 DIAGNOSIS — R279 Unspecified lack of coordination: Secondary | ICD-10-CM | POA: Diagnosis not present

## 2020-09-01 DIAGNOSIS — Z741 Need for assistance with personal care: Secondary | ICD-10-CM | POA: Diagnosis not present

## 2020-09-01 DIAGNOSIS — M25561 Pain in right knee: Secondary | ICD-10-CM | POA: Diagnosis not present

## 2020-09-01 DIAGNOSIS — M1711 Unilateral primary osteoarthritis, right knee: Secondary | ICD-10-CM | POA: Diagnosis not present

## 2020-09-02 DIAGNOSIS — Z741 Need for assistance with personal care: Secondary | ICD-10-CM | POA: Diagnosis not present

## 2020-09-02 DIAGNOSIS — Z9181 History of falling: Secondary | ICD-10-CM | POA: Diagnosis not present

## 2020-09-02 DIAGNOSIS — M6281 Muscle weakness (generalized): Secondary | ICD-10-CM | POA: Diagnosis not present

## 2020-09-02 DIAGNOSIS — R279 Unspecified lack of coordination: Secondary | ICD-10-CM | POA: Diagnosis not present

## 2020-09-02 DIAGNOSIS — R269 Unspecified abnormalities of gait and mobility: Secondary | ICD-10-CM | POA: Diagnosis not present

## 2020-09-02 DIAGNOSIS — R2681 Unsteadiness on feet: Secondary | ICD-10-CM | POA: Diagnosis not present

## 2020-09-05 DIAGNOSIS — R279 Unspecified lack of coordination: Secondary | ICD-10-CM | POA: Diagnosis not present

## 2020-09-05 DIAGNOSIS — R2681 Unsteadiness on feet: Secondary | ICD-10-CM | POA: Diagnosis not present

## 2020-09-05 DIAGNOSIS — R269 Unspecified abnormalities of gait and mobility: Secondary | ICD-10-CM | POA: Diagnosis not present

## 2020-09-05 DIAGNOSIS — M6281 Muscle weakness (generalized): Secondary | ICD-10-CM | POA: Diagnosis not present

## 2020-09-05 DIAGNOSIS — Z741 Need for assistance with personal care: Secondary | ICD-10-CM | POA: Diagnosis not present

## 2020-09-05 DIAGNOSIS — Z9181 History of falling: Secondary | ICD-10-CM | POA: Diagnosis not present

## 2020-09-06 DIAGNOSIS — Z9181 History of falling: Secondary | ICD-10-CM | POA: Diagnosis not present

## 2020-09-06 DIAGNOSIS — M6281 Muscle weakness (generalized): Secondary | ICD-10-CM | POA: Diagnosis not present

## 2020-09-06 DIAGNOSIS — R269 Unspecified abnormalities of gait and mobility: Secondary | ICD-10-CM | POA: Diagnosis not present

## 2020-09-06 DIAGNOSIS — R2681 Unsteadiness on feet: Secondary | ICD-10-CM | POA: Diagnosis not present

## 2020-09-06 DIAGNOSIS — R279 Unspecified lack of coordination: Secondary | ICD-10-CM | POA: Diagnosis not present

## 2020-09-06 DIAGNOSIS — Z741 Need for assistance with personal care: Secondary | ICD-10-CM | POA: Diagnosis not present

## 2020-09-07 DIAGNOSIS — R269 Unspecified abnormalities of gait and mobility: Secondary | ICD-10-CM | POA: Diagnosis not present

## 2020-09-07 DIAGNOSIS — M6281 Muscle weakness (generalized): Secondary | ICD-10-CM | POA: Diagnosis not present

## 2020-09-07 DIAGNOSIS — Z741 Need for assistance with personal care: Secondary | ICD-10-CM | POA: Diagnosis not present

## 2020-09-07 DIAGNOSIS — R2681 Unsteadiness on feet: Secondary | ICD-10-CM | POA: Diagnosis not present

## 2020-09-07 DIAGNOSIS — R279 Unspecified lack of coordination: Secondary | ICD-10-CM | POA: Diagnosis not present

## 2020-09-07 DIAGNOSIS — Z9181 History of falling: Secondary | ICD-10-CM | POA: Diagnosis not present

## 2020-09-09 DIAGNOSIS — Z741 Need for assistance with personal care: Secondary | ICD-10-CM | POA: Diagnosis not present

## 2020-09-09 DIAGNOSIS — R269 Unspecified abnormalities of gait and mobility: Secondary | ICD-10-CM | POA: Diagnosis not present

## 2020-09-09 DIAGNOSIS — R2681 Unsteadiness on feet: Secondary | ICD-10-CM | POA: Diagnosis not present

## 2020-09-09 DIAGNOSIS — R279 Unspecified lack of coordination: Secondary | ICD-10-CM | POA: Diagnosis not present

## 2020-09-09 DIAGNOSIS — Z9181 History of falling: Secondary | ICD-10-CM | POA: Diagnosis not present

## 2020-09-09 DIAGNOSIS — M6281 Muscle weakness (generalized): Secondary | ICD-10-CM | POA: Diagnosis not present

## 2020-09-09 DIAGNOSIS — M84653D Pathological fracture in other disease, unspecified femur, subsequent encounter for fracture with routine healing: Secondary | ICD-10-CM | POA: Diagnosis not present

## 2020-09-11 DIAGNOSIS — R269 Unspecified abnormalities of gait and mobility: Secondary | ICD-10-CM | POA: Diagnosis not present

## 2020-09-11 DIAGNOSIS — Z741 Need for assistance with personal care: Secondary | ICD-10-CM | POA: Diagnosis not present

## 2020-09-11 DIAGNOSIS — R2681 Unsteadiness on feet: Secondary | ICD-10-CM | POA: Diagnosis not present

## 2020-09-11 DIAGNOSIS — R279 Unspecified lack of coordination: Secondary | ICD-10-CM | POA: Diagnosis not present

## 2020-09-11 DIAGNOSIS — M6281 Muscle weakness (generalized): Secondary | ICD-10-CM | POA: Diagnosis not present

## 2020-09-11 DIAGNOSIS — Z9181 History of falling: Secondary | ICD-10-CM | POA: Diagnosis not present

## 2020-09-13 DIAGNOSIS — R2681 Unsteadiness on feet: Secondary | ICD-10-CM | POA: Diagnosis not present

## 2020-09-13 DIAGNOSIS — M6281 Muscle weakness (generalized): Secondary | ICD-10-CM | POA: Diagnosis not present

## 2020-09-13 DIAGNOSIS — R279 Unspecified lack of coordination: Secondary | ICD-10-CM | POA: Diagnosis not present

## 2020-09-13 DIAGNOSIS — Z741 Need for assistance with personal care: Secondary | ICD-10-CM | POA: Diagnosis not present

## 2020-09-13 DIAGNOSIS — Z9181 History of falling: Secondary | ICD-10-CM | POA: Diagnosis not present

## 2020-09-13 DIAGNOSIS — R269 Unspecified abnormalities of gait and mobility: Secondary | ICD-10-CM | POA: Diagnosis not present

## 2020-09-15 DIAGNOSIS — R2681 Unsteadiness on feet: Secondary | ICD-10-CM | POA: Diagnosis not present

## 2020-09-15 DIAGNOSIS — R269 Unspecified abnormalities of gait and mobility: Secondary | ICD-10-CM | POA: Diagnosis not present

## 2020-09-15 DIAGNOSIS — Z9181 History of falling: Secondary | ICD-10-CM | POA: Diagnosis not present

## 2020-09-15 DIAGNOSIS — R279 Unspecified lack of coordination: Secondary | ICD-10-CM | POA: Diagnosis not present

## 2020-09-15 DIAGNOSIS — M6281 Muscle weakness (generalized): Secondary | ICD-10-CM | POA: Diagnosis not present

## 2020-09-15 DIAGNOSIS — Z741 Need for assistance with personal care: Secondary | ICD-10-CM | POA: Diagnosis not present

## 2020-09-19 DIAGNOSIS — R279 Unspecified lack of coordination: Secondary | ICD-10-CM | POA: Diagnosis not present

## 2020-09-19 DIAGNOSIS — Z741 Need for assistance with personal care: Secondary | ICD-10-CM | POA: Diagnosis not present

## 2020-09-19 DIAGNOSIS — R2681 Unsteadiness on feet: Secondary | ICD-10-CM | POA: Diagnosis not present

## 2020-09-19 DIAGNOSIS — Z9181 History of falling: Secondary | ICD-10-CM | POA: Diagnosis not present

## 2020-09-19 DIAGNOSIS — M6281 Muscle weakness (generalized): Secondary | ICD-10-CM | POA: Diagnosis not present

## 2020-09-19 DIAGNOSIS — R269 Unspecified abnormalities of gait and mobility: Secondary | ICD-10-CM | POA: Diagnosis not present

## 2020-09-20 DIAGNOSIS — R2681 Unsteadiness on feet: Secondary | ICD-10-CM | POA: Diagnosis not present

## 2020-09-20 DIAGNOSIS — Z9181 History of falling: Secondary | ICD-10-CM | POA: Diagnosis not present

## 2020-09-20 DIAGNOSIS — M6281 Muscle weakness (generalized): Secondary | ICD-10-CM | POA: Diagnosis not present

## 2020-09-20 DIAGNOSIS — R279 Unspecified lack of coordination: Secondary | ICD-10-CM | POA: Diagnosis not present

## 2020-09-20 DIAGNOSIS — Z741 Need for assistance with personal care: Secondary | ICD-10-CM | POA: Diagnosis not present

## 2020-09-20 DIAGNOSIS — R269 Unspecified abnormalities of gait and mobility: Secondary | ICD-10-CM | POA: Diagnosis not present

## 2020-09-22 DIAGNOSIS — Z9181 History of falling: Secondary | ICD-10-CM | POA: Diagnosis not present

## 2020-09-22 DIAGNOSIS — R279 Unspecified lack of coordination: Secondary | ICD-10-CM | POA: Diagnosis not present

## 2020-09-22 DIAGNOSIS — Z741 Need for assistance with personal care: Secondary | ICD-10-CM | POA: Diagnosis not present

## 2020-09-22 DIAGNOSIS — R2681 Unsteadiness on feet: Secondary | ICD-10-CM | POA: Diagnosis not present

## 2020-09-22 DIAGNOSIS — M6281 Muscle weakness (generalized): Secondary | ICD-10-CM | POA: Diagnosis not present

## 2020-09-22 DIAGNOSIS — R269 Unspecified abnormalities of gait and mobility: Secondary | ICD-10-CM | POA: Diagnosis not present

## 2020-09-24 DIAGNOSIS — R2681 Unsteadiness on feet: Secondary | ICD-10-CM | POA: Diagnosis not present

## 2020-09-24 DIAGNOSIS — R279 Unspecified lack of coordination: Secondary | ICD-10-CM | POA: Diagnosis not present

## 2020-09-24 DIAGNOSIS — Z9181 History of falling: Secondary | ICD-10-CM | POA: Diagnosis not present

## 2020-09-24 DIAGNOSIS — Z741 Need for assistance with personal care: Secondary | ICD-10-CM | POA: Diagnosis not present

## 2020-09-24 DIAGNOSIS — M6281 Muscle weakness (generalized): Secondary | ICD-10-CM | POA: Diagnosis not present

## 2020-09-24 DIAGNOSIS — R269 Unspecified abnormalities of gait and mobility: Secondary | ICD-10-CM | POA: Diagnosis not present

## 2020-09-26 DIAGNOSIS — R2681 Unsteadiness on feet: Secondary | ICD-10-CM | POA: Diagnosis not present

## 2020-09-26 DIAGNOSIS — Z9181 History of falling: Secondary | ICD-10-CM | POA: Diagnosis not present

## 2020-09-26 DIAGNOSIS — M6281 Muscle weakness (generalized): Secondary | ICD-10-CM | POA: Diagnosis not present

## 2020-09-26 DIAGNOSIS — R269 Unspecified abnormalities of gait and mobility: Secondary | ICD-10-CM | POA: Diagnosis not present

## 2020-09-26 DIAGNOSIS — Z741 Need for assistance with personal care: Secondary | ICD-10-CM | POA: Diagnosis not present

## 2020-09-26 DIAGNOSIS — R279 Unspecified lack of coordination: Secondary | ICD-10-CM | POA: Diagnosis not present

## 2020-09-27 DIAGNOSIS — R2681 Unsteadiness on feet: Secondary | ICD-10-CM | POA: Diagnosis not present

## 2020-09-27 DIAGNOSIS — R279 Unspecified lack of coordination: Secondary | ICD-10-CM | POA: Diagnosis not present

## 2020-09-27 DIAGNOSIS — Z9181 History of falling: Secondary | ICD-10-CM | POA: Diagnosis not present

## 2020-09-27 DIAGNOSIS — Z741 Need for assistance with personal care: Secondary | ICD-10-CM | POA: Diagnosis not present

## 2020-09-27 DIAGNOSIS — M6281 Muscle weakness (generalized): Secondary | ICD-10-CM | POA: Diagnosis not present

## 2020-09-27 DIAGNOSIS — R269 Unspecified abnormalities of gait and mobility: Secondary | ICD-10-CM | POA: Diagnosis not present

## 2020-09-29 DIAGNOSIS — Z9181 History of falling: Secondary | ICD-10-CM | POA: Diagnosis not present

## 2020-09-29 DIAGNOSIS — M6281 Muscle weakness (generalized): Secondary | ICD-10-CM | POA: Diagnosis not present

## 2020-09-29 DIAGNOSIS — Z741 Need for assistance with personal care: Secondary | ICD-10-CM | POA: Diagnosis not present

## 2020-09-29 DIAGNOSIS — R2681 Unsteadiness on feet: Secondary | ICD-10-CM | POA: Diagnosis not present

## 2020-09-29 DIAGNOSIS — R279 Unspecified lack of coordination: Secondary | ICD-10-CM | POA: Diagnosis not present

## 2020-09-29 DIAGNOSIS — R269 Unspecified abnormalities of gait and mobility: Secondary | ICD-10-CM | POA: Diagnosis not present

## 2020-10-04 DIAGNOSIS — R2681 Unsteadiness on feet: Secondary | ICD-10-CM | POA: Diagnosis not present

## 2020-10-04 DIAGNOSIS — Z9181 History of falling: Secondary | ICD-10-CM | POA: Diagnosis not present

## 2020-10-04 DIAGNOSIS — R269 Unspecified abnormalities of gait and mobility: Secondary | ICD-10-CM | POA: Diagnosis not present

## 2020-10-04 DIAGNOSIS — M6281 Muscle weakness (generalized): Secondary | ICD-10-CM | POA: Diagnosis not present

## 2020-10-04 DIAGNOSIS — R279 Unspecified lack of coordination: Secondary | ICD-10-CM | POA: Diagnosis not present

## 2020-10-04 DIAGNOSIS — Z741 Need for assistance with personal care: Secondary | ICD-10-CM | POA: Diagnosis not present

## 2020-10-06 DIAGNOSIS — R350 Frequency of micturition: Secondary | ICD-10-CM | POA: Diagnosis not present

## 2020-10-07 DIAGNOSIS — Z9181 History of falling: Secondary | ICD-10-CM | POA: Diagnosis not present

## 2020-10-07 DIAGNOSIS — I700 Atherosclerosis of aorta: Secondary | ICD-10-CM | POA: Diagnosis not present

## 2020-10-07 DIAGNOSIS — M6281 Muscle weakness (generalized): Secondary | ICD-10-CM | POA: Diagnosis not present

## 2020-10-07 DIAGNOSIS — E7800 Pure hypercholesterolemia, unspecified: Secondary | ICD-10-CM | POA: Diagnosis not present

## 2020-10-07 DIAGNOSIS — J449 Chronic obstructive pulmonary disease, unspecified: Secondary | ICD-10-CM | POA: Diagnosis not present

## 2020-10-07 DIAGNOSIS — R269 Unspecified abnormalities of gait and mobility: Secondary | ICD-10-CM | POA: Diagnosis not present

## 2020-10-07 DIAGNOSIS — M810 Age-related osteoporosis without current pathological fracture: Secondary | ICD-10-CM | POA: Diagnosis not present

## 2020-10-07 DIAGNOSIS — R279 Unspecified lack of coordination: Secondary | ICD-10-CM | POA: Diagnosis not present

## 2020-10-07 DIAGNOSIS — M8448XD Pathological fracture, other site, subsequent encounter for fracture with routine healing: Secondary | ICD-10-CM | POA: Diagnosis not present

## 2020-10-07 DIAGNOSIS — E559 Vitamin D deficiency, unspecified: Secondary | ICD-10-CM | POA: Diagnosis not present

## 2020-10-07 DIAGNOSIS — I129 Hypertensive chronic kidney disease with stage 1 through stage 4 chronic kidney disease, or unspecified chronic kidney disease: Secondary | ICD-10-CM | POA: Diagnosis not present

## 2020-10-07 DIAGNOSIS — E1129 Type 2 diabetes mellitus with other diabetic kidney complication: Secondary | ICD-10-CM | POA: Diagnosis not present

## 2020-10-07 DIAGNOSIS — M4302 Spondylolysis, cervical region: Secondary | ICD-10-CM | POA: Diagnosis not present

## 2020-10-07 DIAGNOSIS — N1831 Chronic kidney disease, stage 3a: Secondary | ICD-10-CM | POA: Diagnosis not present

## 2020-10-07 DIAGNOSIS — J4550 Severe persistent asthma, uncomplicated: Secondary | ICD-10-CM | POA: Diagnosis not present

## 2020-10-07 DIAGNOSIS — Z741 Need for assistance with personal care: Secondary | ICD-10-CM | POA: Diagnosis not present

## 2020-10-07 DIAGNOSIS — R2681 Unsteadiness on feet: Secondary | ICD-10-CM | POA: Diagnosis not present

## 2020-10-07 DIAGNOSIS — I739 Peripheral vascular disease, unspecified: Secondary | ICD-10-CM | POA: Diagnosis not present

## 2020-10-08 DIAGNOSIS — R269 Unspecified abnormalities of gait and mobility: Secondary | ICD-10-CM | POA: Diagnosis not present

## 2020-10-08 DIAGNOSIS — Z9181 History of falling: Secondary | ICD-10-CM | POA: Diagnosis not present

## 2020-10-08 DIAGNOSIS — M6281 Muscle weakness (generalized): Secondary | ICD-10-CM | POA: Diagnosis not present

## 2020-10-08 DIAGNOSIS — Z741 Need for assistance with personal care: Secondary | ICD-10-CM | POA: Diagnosis not present

## 2020-10-08 DIAGNOSIS — R279 Unspecified lack of coordination: Secondary | ICD-10-CM | POA: Diagnosis not present

## 2020-10-08 DIAGNOSIS — R2681 Unsteadiness on feet: Secondary | ICD-10-CM | POA: Diagnosis not present

## 2020-10-10 DIAGNOSIS — R269 Unspecified abnormalities of gait and mobility: Secondary | ICD-10-CM | POA: Diagnosis not present

## 2020-10-10 DIAGNOSIS — M84653D Pathological fracture in other disease, unspecified femur, subsequent encounter for fracture with routine healing: Secondary | ICD-10-CM | POA: Diagnosis not present

## 2020-10-10 DIAGNOSIS — M6281 Muscle weakness (generalized): Secondary | ICD-10-CM | POA: Diagnosis not present

## 2020-10-10 DIAGNOSIS — Z9181 History of falling: Secondary | ICD-10-CM | POA: Diagnosis not present

## 2020-10-10 DIAGNOSIS — R2681 Unsteadiness on feet: Secondary | ICD-10-CM | POA: Diagnosis not present

## 2020-10-10 DIAGNOSIS — Z741 Need for assistance with personal care: Secondary | ICD-10-CM | POA: Diagnosis not present

## 2020-10-10 DIAGNOSIS — R279 Unspecified lack of coordination: Secondary | ICD-10-CM | POA: Diagnosis not present

## 2020-10-11 ENCOUNTER — Telehealth: Payer: Self-pay | Admitting: Neurology

## 2020-10-11 NOTE — Telephone Encounter (Signed)
I spoke to her daughter. The patient has Botox for both urinary spasms with urology and cervical dystonia with our office. She has been getting it for both diagnosis for years. Normally, she is injected further apart but is off schedule this time. She wanted to let us know that she has her injections next week w/ urology and 10/26/20 with [place].

## 2020-10-11 NOTE — Telephone Encounter (Signed)
Pt's daughter, Merilynn [place] (on [place]) called, she has a Botox appt at [facility] next week for incontinence and she has Botox appt schedule with [facility] on 8/17. Want to make sure having appts that close together is ok. Would like a call from the nurse.

## 2020-10-15 DIAGNOSIS — R269 Unspecified abnormalities of gait and mobility: Secondary | ICD-10-CM | POA: Diagnosis not present

## 2020-10-15 DIAGNOSIS — M6281 Muscle weakness (generalized): Secondary | ICD-10-CM | POA: Diagnosis not present

## 2020-10-15 DIAGNOSIS — Z9181 History of falling: Secondary | ICD-10-CM | POA: Diagnosis not present

## 2020-10-15 DIAGNOSIS — Z741 Need for assistance with personal care: Secondary | ICD-10-CM | POA: Diagnosis not present

## 2020-10-15 DIAGNOSIS — R2681 Unsteadiness on feet: Secondary | ICD-10-CM | POA: Diagnosis not present

## 2020-10-15 DIAGNOSIS — R279 Unspecified lack of coordination: Secondary | ICD-10-CM | POA: Diagnosis not present

## 2020-10-17 DIAGNOSIS — R269 Unspecified abnormalities of gait and mobility: Secondary | ICD-10-CM | POA: Diagnosis not present

## 2020-10-17 DIAGNOSIS — R279 Unspecified lack of coordination: Secondary | ICD-10-CM | POA: Diagnosis not present

## 2020-10-17 DIAGNOSIS — Z741 Need for assistance with personal care: Secondary | ICD-10-CM | POA: Diagnosis not present

## 2020-10-17 DIAGNOSIS — M6281 Muscle weakness (generalized): Secondary | ICD-10-CM | POA: Diagnosis not present

## 2020-10-17 DIAGNOSIS — R2681 Unsteadiness on feet: Secondary | ICD-10-CM | POA: Diagnosis not present

## 2020-10-17 DIAGNOSIS — Z9181 History of falling: Secondary | ICD-10-CM | POA: Diagnosis not present

## 2020-10-19 DIAGNOSIS — Z741 Need for assistance with personal care: Secondary | ICD-10-CM | POA: Diagnosis not present

## 2020-10-19 DIAGNOSIS — R279 Unspecified lack of coordination: Secondary | ICD-10-CM | POA: Diagnosis not present

## 2020-10-19 DIAGNOSIS — R2681 Unsteadiness on feet: Secondary | ICD-10-CM | POA: Diagnosis not present

## 2020-10-19 DIAGNOSIS — M6281 Muscle weakness (generalized): Secondary | ICD-10-CM | POA: Diagnosis not present

## 2020-10-19 DIAGNOSIS — R269 Unspecified abnormalities of gait and mobility: Secondary | ICD-10-CM | POA: Diagnosis not present

## 2020-10-19 DIAGNOSIS — Z9181 History of falling: Secondary | ICD-10-CM | POA: Diagnosis not present

## 2020-10-20 DIAGNOSIS — Z741 Need for assistance with personal care: Secondary | ICD-10-CM | POA: Diagnosis not present

## 2020-10-20 DIAGNOSIS — R2681 Unsteadiness on feet: Secondary | ICD-10-CM | POA: Diagnosis not present

## 2020-10-20 DIAGNOSIS — N3941 Urge incontinence: Secondary | ICD-10-CM | POA: Diagnosis not present

## 2020-10-20 DIAGNOSIS — Z9181 History of falling: Secondary | ICD-10-CM | POA: Diagnosis not present

## 2020-10-20 DIAGNOSIS — R279 Unspecified lack of coordination: Secondary | ICD-10-CM | POA: Diagnosis not present

## 2020-10-20 DIAGNOSIS — M6281 Muscle weakness (generalized): Secondary | ICD-10-CM | POA: Diagnosis not present

## 2020-10-20 DIAGNOSIS — R269 Unspecified abnormalities of gait and mobility: Secondary | ICD-10-CM | POA: Diagnosis not present

## 2020-10-25 DIAGNOSIS — R279 Unspecified lack of coordination: Secondary | ICD-10-CM | POA: Diagnosis not present

## 2020-10-25 DIAGNOSIS — Z741 Need for assistance with personal care: Secondary | ICD-10-CM | POA: Diagnosis not present

## 2020-10-25 DIAGNOSIS — Z9181 History of falling: Secondary | ICD-10-CM | POA: Diagnosis not present

## 2020-10-25 DIAGNOSIS — R269 Unspecified abnormalities of gait and mobility: Secondary | ICD-10-CM | POA: Diagnosis not present

## 2020-10-25 DIAGNOSIS — M6281 Muscle weakness (generalized): Secondary | ICD-10-CM | POA: Diagnosis not present

## 2020-10-25 DIAGNOSIS — R2681 Unsteadiness on feet: Secondary | ICD-10-CM | POA: Diagnosis not present

## 2020-10-26 ENCOUNTER — Ambulatory Visit (INDEPENDENT_AMBULATORY_CARE_PROVIDER_SITE_OTHER): Payer: Medicare Other | Admitting: Neurology

## 2020-10-26 ENCOUNTER — Encounter: Payer: Self-pay | Admitting: Neurology

## 2020-10-26 VITALS — BP 148/67 | HR 79

## 2020-10-26 DIAGNOSIS — M542 Cervicalgia: Secondary | ICD-10-CM

## 2020-10-26 DIAGNOSIS — G243 Spasmodic torticollis: Secondary | ICD-10-CM

## 2020-10-26 MED ORDER — ONABOTULINUMTOXINA 100 UNITS IJ SOLR
100.0000 [IU] | Freq: Once | INTRAMUSCULAR | Status: AC
  Administered 2020-10-26: 100 [IU] via INTRAMUSCULAR

## 2020-10-26 NOTE — Progress Notes (Signed)
PATIENT: Wendy Velasquez DOB: 1925-06-28  Chief Complaint  Patient presents with   Procedure    Botox     HISTORICAL  UTA Wendy Velasquez is a 87 years old female, seen in request by her primary care physician Dr. Osborne Casco, Fransico Him for evaluation of neck pain, headaches, initial evaluation was on December 05, 2017.  She is accompanied by her daughter at today's clinical visit.  I have reviewed and summarized the referring note from the referring physician, she has past medical history of diabetes, use insulin, history of cardiac block, status post pacemaker, hyperlipidemia,  She had a history of chronic neck pain, had acute worsening since March 2018, she woke up one morning, noticed upper neck throbbing pain, radiating pain to her skull, has been persistent since then, over the last 1 year, she was seen by orthopedic PA, Ronette Deter, was treated with multiple trigger point injection, with mixture of 1% lidocaine, and Depo-Medrol, most recent injection was on October 10, 2017, on a monthly basis, patient denies significant improvement, also complains of worsening glucose control after injection,  She denies significant radiating pain to bilateral shoulder upper extremity, denies difficulty using arms, she had chronic urinary incontinence is receiving Botox injection by urologist, she contributed to her gait abnormality to her bilateral knee pain, chronic arthritis,  She now complains of difficulty turning her neck, 6 out of 10 constant deep achy pain, multiple tender spots at cervical, upper trapezius, oftentimes she has transient sharp radiating range of motion, difficulty turning her neck,  She also tried physical therapy, is doing morning neck traction regularly,  UPDATE Jan 02 2018: She continue complains of significant neck pain, 8 out of 10 sometimes, radiating pain to bilateral occipital region despite gabapentin 100 mg 5 tablets each day, I have personally reviewed CT cervical  spine on December 10, 2017, no acute abnormality, multilevel spondylolisthesis, maximum at C4-5, with associated   facet arthropathy, evidence of ankylosis, mild spinal stenosis at the C2-3, C3-4 level  UPDATE Apr 08 2018: She responded very well to her initial injection January 02, 2018, we used 100 units of Botox, a month after injection she was pain-free for 2 months, there was no significant side effect noticed, in the past 2 weeks, she noticed returning of her neck pain, centered at bilateral mastoid process, nuchal line, tenderness upon deep palpitation.  UPDATE August 12 2018: She responded very well to previous injection  UPDATE Sept 9 2020: She responded well to previous injection, we used Botox a 100 units, is take about 3 days for the benefit to kicking, lasting for 2 to 3 months, no significant side effect.  UPDATE Feb 25 2019: She responded well to previous injection  UPDATE June 01 2019: She suffered COVID-29 January 2020, required hospitalization but no intubation, she complains of increased bilateral upper back pain, neck pain.  Update September 09 2019 She complains of worsening neck pain, she was admitted to hospital in early February for acute hypoxic respiratory failure due to COVID-19 pneumonia, she was treated with remdesivir, did not require intubation, however, since the infection, she complains of declining of her status, could no longer walking, rely on wheelchair, fatigue,  UPDATE Set 29 2021: She is with her daughter at today's clinical visit, suffered pelvic fracture, Botox low dose 100 units continue to help her neck muscle pain, spasm  UPDATE Mar 23 2020: She is accompanied by her daughter at today's clinical visit, overall doing better, previous Botox injection 100  units has really helped her neck pain, is receiving physical therapy  Update Jul 20, 2020: She responded well to previous injection, recent hospital for aspiration pneumonia, complains of increased neck  pain, essentially wheelchair-bound  REVIEW OF SYSTEMS: Full 14 system review of systems performed and notable only for as above. ALLERGIES: No Known Allergies  HOME MEDICATIONS: Current Outpatient Medications  Medication Sig Dispense Refill   acetaminophen (TYLENOL) 325 MG tablet Take 650 mg by mouth every 6 (six) hours as needed.     albuterol (PROVENTIL) (2.5 MG/3ML) 0.083% nebulizer solution Take 3 mLs (2.5 mg total) by nebulization every 6 (six) hours as needed for wheezing or shortness of breath. 75 mL 0   aspirin 81 MG chewable tablet Chew 81 mg by mouth daily.     azithromycin (ZITHROMAX) 250 MG tablet Take 1 tablet daily PO until completed 3 each 0   botulinum toxin Type A (BOTOX) 100 units SOLR injection Inject 100 Units into the muscle every 3 (three) months.     Brinzolamide-Brimonidine (SIMBRINZA) 1-0.2 % SUSP Apply 1 drop to eye at bedtime. (Patient taking differently: Place 1 drop into both eyes at bedtime.) 8 mL 0   cetirizine (ZYRTEC) 10 MG tablet Take 10 mg by mouth daily.     Cholecalciferol (VITAMIN D) 50 MCG (2000 UT) tablet Take 2,000 Units by mouth daily.     esomeprazole (NEXIUM) 40 MG capsule Take 40 mg by mouth daily at 12 noon.     famotidine (PEPCID) 20 MG tablet Take 20 mg by mouth daily as needed for heartburn or indigestion.     fluticasone (FLONASE) 50 MCG/ACT nasal spray Place 2 sprays into both nostrils daily as needed for allergies. SHAKE AS DIRECTED 16 g 0   Fluticasone-Salmeterol (ADVAIR) 250-50 MCG/DOSE AEPB Inhale 1 puff into the lungs 2 (two) times daily.     gabapentin (NEURONTIN) 100 MG capsule Take 1 capsule (100 mg total) by mouth at bedtime. 30 capsule 0   ibandronate (BONIVA) 150 MG tablet Take 1 tablet (150 mg total) by mouth every 30 (thirty) days. Take in the morning with a full glass of water, on an empty stomach, and do not take anything else by mouth or lie down for the next 30 min. 1 tablet 0   Ipratropium-Albuterol (COMBIVENT RESPIMAT)  20-100 MCG/ACT AERS respimat Inhale 1 puff into the lungs every 8 (eight) hours as needed for wheezing. 4 g 0   LANTUS SOLOSTAR 100 UNIT/ML Solostar Pen Inject 8 Units into the skin at bedtime.     latanoprost (XALATAN) 0.005 % ophthalmic solution Place 1 drop into both eyes at bedtime.     lidocaine-prilocaine (EMLA) cream Apply 1 application topically 4 (four) times daily as needed. (Patient taking differently: Apply 1 application topically 4 (four) times daily as needed (pain).) 30 g 0   Menthol, Topical Analgesic, (BIOFREEZE) 4 % GEL Apply 1 application topically daily as needed (pain). Apply to Right Leg.     metFORMIN (GLUCOPHAGE) 1000 MG tablet Take 1 tablet (1,000 mg total) by mouth 2 (two) times daily with a meal. 60 tablet 0   MYRBETRIQ 25 MG TB24 tablet Take 1 tablet (25 mg total) by mouth daily. 30 tablet 0   sertraline (ZOLOFT) 50 MG tablet Take 1 tablet (50 mg total) by mouth 2 (two) times daily. (Patient taking differently: Take 50 mg by mouth daily.) 60 tablet 0   vitamin B-12 (CYANOCOBALAMIN) 1000 MCG tablet Take 1 tablet (1,000 mcg total) by mouth daily.  30 tablet 0   No current facility-administered medications for this visit.    PAST MEDICAL HISTORY: Past Medical History:  Diagnosis Date   Asthma    Cardiac pacemaker in situ    Complete heart block (HCC)    COPD (chronic obstructive pulmonary disease) (HCC)    Diverticulosis of colon    GERD (gastroesophageal reflux disease)    Glaucoma of both eyes    Heart disease    History of cardiac arrest    09-11-2012--  symptomatic complete heart block -- hr 20's--  cpr, intubated and temp. pacer until  perm. pacemaker placement   History of esophageal dilatation    for stricture 2007   History of hiatal hernia    History of iron deficiency anemia    hx transfusion's 15 yrs ago , approx 2001   Hyperlipidemia    Hypertension    Neck pain    Peripheral neuropathy    Tachy-brady syndrome (HCC)    Type 2 diabetes mellitus  ([place])    Urgency incontinence    Botox injections for bladder.   Wears glasses     PAST SURGICAL HISTORY: Past Surgical History:  Procedure Laterality Date   ABDOMINAL HYSTERECTOMY  1976  approx   w/ unilateral salpingoophorectomy   CATARACT EXTRACTION W/ INTRAOCULAR LENS  IMPLANT, BILATERAL  1990   COLONOSCOPY WITH ESOPHAGOGASTRODUODENOSCOPY (EGD)  last one 2007   INTERSTIM IMPLANT PLACEMENT  2012   INTERSTIM IMPLANT REMOVAL N/A 09/21/2014   Procedure: REMOVAL OF INTERSTIM IMPLANT;  Surgeon: Bjorn Loser, MD;  Location[facility];  Service: Urology;  Laterality: N/A;   INTERSTIM IMPLANT REVISION N/A 09/21/2014   Procedure: Barrie Lyme STAGE ONE AND TWO;  Surgeon: Bjorn Loser, MD;  Location: [facility];  Service: Urology;  Laterality: N/A;   LAPAROTOMY W/ UNILATERAL SALPINGOOPHORECTOMY  1960's  approx   PERMANENT PACEMAKER INSERTION N/A 09/11/2012   Procedure: PERMANENT PACEMAKER INSERTION;  Surgeon: Evans Lance, MD;  [facility];  Service: Cardiovascular;  Laterality: N/A;  St. Jude Dual Chamber   RECTOCELE REPAIR  1991  approx    FAMILY HISTORY: Family History  Problem Relation Age of Onset   Other Mother        died during her childbirth   Prostatitis Father    Heart failure Sister    Breast cancer Sister    Diabetic kidney disease Sister    Melanoma Brother     SOCIAL HISTORY: Social History   Socioeconomic History   Marital status: Married    Spouse name: Not on file   Number of children: 3   Years of education: 12   Highest education level: High school graduate  Occupational History   Occupation: Retired  Tobacco Use   Smoking status: Never   Smokeless tobacco: Never  Scientific laboratory technician Use: Never used  Substance and Sexual Activity   Alcohol use: No   Drug use: No   Sexual activity: Not Currently    Birth control/protection: None  Other Topics Concern   Not on file  Social History Narrative   Lives  with her husband in an independent living facility.   Right-handed.   1-2 cup caffeine daily.   Social Determinants of Health   Financial Resource Strain: Not on file  Food Insecurity: Not on file  Transportation Needs: Not on file  Physical Activity: Not on file  Stress: Not on file  Social Connections: Not on file  Intimate Partner Violence:  Not on file     PHYSICAL EXAM  NEUROLOGICAL EXAM: Multiple posterior neck muscle spasm, painful upon deep palpitation, especially at bilateral levator scapula, latissimus longus, scapular cervix, tendency to shrug bilateral shoulders  DIAGNOSTIC DATA (LABS, IMAGING, TESTING) - I reviewed patient records, labs, notes, testing and imaging myself where available.   ASSESSMENT AND PLAN  CARLIN MADERO is a 87 y.o. female    Chronic neck pain, headaches Abnormal neck posturing, mild retrocollis, left tilt, significant tenderness at bilateral lateral cervical paraspinal muscles, bilateral levator scapular, tendency to shrug bilateral shoulders  Worsening gait abnormalities  She has significant hyperreflexia on examination,  CT cervical showed evidence of multilevel spondylolisthesis, maximum at C4-5 with associated facet arthropathy, developing ankylosis  Patient and her family do not want pursue further evaluation at this point   EMG guided Botox injection Used 100 units of Botox A    Right longissimus capitis 12.5 units Right splenius cervix 12.5 Right semispinalis 12.5 units Right levator scapular 12.5 units Left splenius capitis 12.5 units   Left longissimus capitis 12.5 units Left splenius cervix 12.5 units Left levator scapular 12.5 units     Marcial Pacas, M.D. Ph.D.  [facility] [address], [place], [place] [address] Ph: [phone] Fax: [phone]

## 2020-10-26 NOTE — Progress Notes (Signed)
**  Botox 100 units x 1 vial, [place] 0259-1610-01, Lot [id number], Exp 02/2022, office supply.//mck,rn**

## 2020-10-27 DIAGNOSIS — M6281 Muscle weakness (generalized): Secondary | ICD-10-CM | POA: Diagnosis not present

## 2020-10-27 DIAGNOSIS — R2681 Unsteadiness on feet: Secondary | ICD-10-CM | POA: Diagnosis not present

## 2020-10-27 DIAGNOSIS — Z741 Need for assistance with personal care: Secondary | ICD-10-CM | POA: Diagnosis not present

## 2020-10-27 DIAGNOSIS — R279 Unspecified lack of coordination: Secondary | ICD-10-CM | POA: Diagnosis not present

## 2020-10-27 DIAGNOSIS — Z9181 History of falling: Secondary | ICD-10-CM | POA: Diagnosis not present

## 2020-10-27 DIAGNOSIS — R269 Unspecified abnormalities of gait and mobility: Secondary | ICD-10-CM | POA: Diagnosis not present

## 2020-11-01 DIAGNOSIS — R2681 Unsteadiness on feet: Secondary | ICD-10-CM | POA: Diagnosis not present

## 2020-11-01 DIAGNOSIS — M6281 Muscle weakness (generalized): Secondary | ICD-10-CM | POA: Diagnosis not present

## 2020-11-01 DIAGNOSIS — Z9181 History of falling: Secondary | ICD-10-CM | POA: Diagnosis not present

## 2020-11-01 DIAGNOSIS — R279 Unspecified lack of coordination: Secondary | ICD-10-CM | POA: Diagnosis not present

## 2020-11-01 DIAGNOSIS — R269 Unspecified abnormalities of gait and mobility: Secondary | ICD-10-CM | POA: Diagnosis not present

## 2020-11-01 DIAGNOSIS — Z741 Need for assistance with personal care: Secondary | ICD-10-CM | POA: Diagnosis not present

## 2020-11-03 DIAGNOSIS — R279 Unspecified lack of coordination: Secondary | ICD-10-CM | POA: Diagnosis not present

## 2020-11-03 DIAGNOSIS — R2681 Unsteadiness on feet: Secondary | ICD-10-CM | POA: Diagnosis not present

## 2020-11-03 DIAGNOSIS — Z9181 History of falling: Secondary | ICD-10-CM | POA: Diagnosis not present

## 2020-11-03 DIAGNOSIS — Z741 Need for assistance with personal care: Secondary | ICD-10-CM | POA: Diagnosis not present

## 2020-11-03 DIAGNOSIS — M6281 Muscle weakness (generalized): Secondary | ICD-10-CM | POA: Diagnosis not present

## 2020-11-03 DIAGNOSIS — R269 Unspecified abnormalities of gait and mobility: Secondary | ICD-10-CM | POA: Diagnosis not present

## 2020-11-04 DIAGNOSIS — R269 Unspecified abnormalities of gait and mobility: Secondary | ICD-10-CM | POA: Diagnosis not present

## 2020-11-04 DIAGNOSIS — M6281 Muscle weakness (generalized): Secondary | ICD-10-CM | POA: Diagnosis not present

## 2020-11-04 DIAGNOSIS — Z9181 History of falling: Secondary | ICD-10-CM | POA: Diagnosis not present

## 2020-11-04 DIAGNOSIS — Z741 Need for assistance with personal care: Secondary | ICD-10-CM | POA: Diagnosis not present

## 2020-11-04 DIAGNOSIS — R279 Unspecified lack of coordination: Secondary | ICD-10-CM | POA: Diagnosis not present

## 2020-11-04 DIAGNOSIS — R2681 Unsteadiness on feet: Secondary | ICD-10-CM | POA: Diagnosis not present

## 2020-11-07 DIAGNOSIS — R8271 Bacteriuria: Secondary | ICD-10-CM | POA: Diagnosis not present

## 2020-11-07 DIAGNOSIS — N3020 Other chronic cystitis without hematuria: Secondary | ICD-10-CM | POA: Diagnosis not present

## 2020-11-07 DIAGNOSIS — N3946 Mixed incontinence: Secondary | ICD-10-CM | POA: Diagnosis not present

## 2020-11-08 DIAGNOSIS — R2681 Unsteadiness on feet: Secondary | ICD-10-CM | POA: Diagnosis not present

## 2020-11-08 DIAGNOSIS — R279 Unspecified lack of coordination: Secondary | ICD-10-CM | POA: Diagnosis not present

## 2020-11-08 DIAGNOSIS — Z741 Need for assistance with personal care: Secondary | ICD-10-CM | POA: Diagnosis not present

## 2020-11-08 DIAGNOSIS — R269 Unspecified abnormalities of gait and mobility: Secondary | ICD-10-CM | POA: Diagnosis not present

## 2020-11-08 DIAGNOSIS — Z9181 History of falling: Secondary | ICD-10-CM | POA: Diagnosis not present

## 2020-11-08 DIAGNOSIS — M6281 Muscle weakness (generalized): Secondary | ICD-10-CM | POA: Diagnosis not present

## 2020-11-10 DIAGNOSIS — M84653D Pathological fracture in other disease, unspecified femur, subsequent encounter for fracture with routine healing: Secondary | ICD-10-CM | POA: Diagnosis not present

## 2020-11-10 DIAGNOSIS — Z9181 History of falling: Secondary | ICD-10-CM | POA: Diagnosis not present

## 2020-11-10 DIAGNOSIS — R269 Unspecified abnormalities of gait and mobility: Secondary | ICD-10-CM | POA: Diagnosis not present

## 2020-11-10 DIAGNOSIS — Z741 Need for assistance with personal care: Secondary | ICD-10-CM | POA: Diagnosis not present

## 2020-11-10 DIAGNOSIS — R279 Unspecified lack of coordination: Secondary | ICD-10-CM | POA: Diagnosis not present

## 2020-11-10 DIAGNOSIS — R2681 Unsteadiness on feet: Secondary | ICD-10-CM | POA: Diagnosis not present

## 2020-11-10 DIAGNOSIS — M6281 Muscle weakness (generalized): Secondary | ICD-10-CM | POA: Diagnosis not present

## 2020-11-11 ENCOUNTER — Ambulatory Visit (INDEPENDENT_AMBULATORY_CARE_PROVIDER_SITE_OTHER): Payer: Medicare Other

## 2020-11-11 DIAGNOSIS — Z9181 History of falling: Secondary | ICD-10-CM | POA: Diagnosis not present

## 2020-11-11 DIAGNOSIS — R279 Unspecified lack of coordination: Secondary | ICD-10-CM | POA: Diagnosis not present

## 2020-11-11 DIAGNOSIS — M6281 Muscle weakness (generalized): Secondary | ICD-10-CM | POA: Diagnosis not present

## 2020-11-11 DIAGNOSIS — R2681 Unsteadiness on feet: Secondary | ICD-10-CM | POA: Diagnosis not present

## 2020-11-11 DIAGNOSIS — I442 Atrioventricular block, complete: Secondary | ICD-10-CM

## 2020-11-11 DIAGNOSIS — R269 Unspecified abnormalities of gait and mobility: Secondary | ICD-10-CM | POA: Diagnosis not present

## 2020-11-11 DIAGNOSIS — Z741 Need for assistance with personal care: Secondary | ICD-10-CM | POA: Diagnosis not present

## 2020-11-15 DIAGNOSIS — R269 Unspecified abnormalities of gait and mobility: Secondary | ICD-10-CM | POA: Diagnosis not present

## 2020-11-15 DIAGNOSIS — Z741 Need for assistance with personal care: Secondary | ICD-10-CM | POA: Diagnosis not present

## 2020-11-15 DIAGNOSIS — R2681 Unsteadiness on feet: Secondary | ICD-10-CM | POA: Diagnosis not present

## 2020-11-15 DIAGNOSIS — M6281 Muscle weakness (generalized): Secondary | ICD-10-CM | POA: Diagnosis not present

## 2020-11-15 DIAGNOSIS — R279 Unspecified lack of coordination: Secondary | ICD-10-CM | POA: Diagnosis not present

## 2020-11-15 DIAGNOSIS — Z9181 History of falling: Secondary | ICD-10-CM | POA: Diagnosis not present

## 2020-11-15 LAB — CUP PACEART REMOTE DEVICE CHECK
Battery Remaining Longevity: 9 mo
Battery Remaining Percentage: 7 %
Battery Voltage: 2.78 V
Brady Statistic AP VP Percent: 1 %
Brady Statistic AP VS Percent: 1 %
Brady Statistic AS VP Percent: 99 %
Brady Statistic AS VS Percent: 1 %
Brady Statistic RA Percent Paced: 1 %
Brady Statistic RV Percent Paced: 99 %
Date Time Interrogation Session: 20220902040016
Implantable Lead Implant Date: 20140703
Implantable Lead Implant Date: 20140703
Implantable Lead Location: 753859
Implantable Lead Location: 753860
Implantable Pulse Generator Implant Date: 20140703
Lead Channel Impedance Value: 440 Ohm
Lead Channel Impedance Value: 640 Ohm
Lead Channel Pacing Threshold Amplitude: 0.5 V
Lead Channel Pacing Threshold Amplitude: 1.375 V
Lead Channel Pacing Threshold Pulse Width: 0.4 ms
Lead Channel Pacing Threshold Pulse Width: 0.4 ms
Lead Channel Sensing Intrinsic Amplitude: 12 mV
Lead Channel Sensing Intrinsic Amplitude: 2 mV
Lead Channel Setting Pacing Amplitude: 1.625
Lead Channel Setting Pacing Amplitude: 2 V
Lead Channel Setting Pacing Pulse Width: 0.4 ms
Lead Channel Setting Sensing Sensitivity: 12.5 mV
Pulse Gen Model: 2240
Pulse Gen Serial Number: 7522008

## 2020-11-16 DIAGNOSIS — R2681 Unsteadiness on feet: Secondary | ICD-10-CM | POA: Diagnosis not present

## 2020-11-16 DIAGNOSIS — R269 Unspecified abnormalities of gait and mobility: Secondary | ICD-10-CM | POA: Diagnosis not present

## 2020-11-16 DIAGNOSIS — R279 Unspecified lack of coordination: Secondary | ICD-10-CM | POA: Diagnosis not present

## 2020-11-16 DIAGNOSIS — M6281 Muscle weakness (generalized): Secondary | ICD-10-CM | POA: Diagnosis not present

## 2020-11-16 DIAGNOSIS — Z9181 History of falling: Secondary | ICD-10-CM | POA: Diagnosis not present

## 2020-11-16 DIAGNOSIS — Z741 Need for assistance with personal care: Secondary | ICD-10-CM | POA: Diagnosis not present

## 2020-11-17 DIAGNOSIS — R279 Unspecified lack of coordination: Secondary | ICD-10-CM | POA: Diagnosis not present

## 2020-11-17 DIAGNOSIS — R269 Unspecified abnormalities of gait and mobility: Secondary | ICD-10-CM | POA: Diagnosis not present

## 2020-11-17 DIAGNOSIS — Z741 Need for assistance with personal care: Secondary | ICD-10-CM | POA: Diagnosis not present

## 2020-11-17 DIAGNOSIS — M6281 Muscle weakness (generalized): Secondary | ICD-10-CM | POA: Diagnosis not present

## 2020-11-17 DIAGNOSIS — R2681 Unsteadiness on feet: Secondary | ICD-10-CM | POA: Diagnosis not present

## 2020-11-17 DIAGNOSIS — Z9181 History of falling: Secondary | ICD-10-CM | POA: Diagnosis not present

## 2020-11-18 DIAGNOSIS — R2681 Unsteadiness on feet: Secondary | ICD-10-CM | POA: Diagnosis not present

## 2020-11-18 DIAGNOSIS — Z9181 History of falling: Secondary | ICD-10-CM | POA: Diagnosis not present

## 2020-11-18 DIAGNOSIS — R279 Unspecified lack of coordination: Secondary | ICD-10-CM | POA: Diagnosis not present

## 2020-11-18 DIAGNOSIS — M6281 Muscle weakness (generalized): Secondary | ICD-10-CM | POA: Diagnosis not present

## 2020-11-18 DIAGNOSIS — R269 Unspecified abnormalities of gait and mobility: Secondary | ICD-10-CM | POA: Diagnosis not present

## 2020-11-18 DIAGNOSIS — Z741 Need for assistance with personal care: Secondary | ICD-10-CM | POA: Diagnosis not present

## 2020-11-22 DIAGNOSIS — M6281 Muscle weakness (generalized): Secondary | ICD-10-CM | POA: Diagnosis not present

## 2020-11-22 DIAGNOSIS — R269 Unspecified abnormalities of gait and mobility: Secondary | ICD-10-CM | POA: Diagnosis not present

## 2020-11-22 DIAGNOSIS — Z9181 History of falling: Secondary | ICD-10-CM | POA: Diagnosis not present

## 2020-11-22 DIAGNOSIS — R279 Unspecified lack of coordination: Secondary | ICD-10-CM | POA: Diagnosis not present

## 2020-11-22 DIAGNOSIS — Z741 Need for assistance with personal care: Secondary | ICD-10-CM | POA: Diagnosis not present

## 2020-11-22 DIAGNOSIS — R2681 Unsteadiness on feet: Secondary | ICD-10-CM | POA: Diagnosis not present

## 2020-11-22 NOTE — Progress Notes (Signed)
Remote pacemaker transmission.   

## 2020-11-23 DIAGNOSIS — Z9181 History of falling: Secondary | ICD-10-CM | POA: Diagnosis not present

## 2020-11-23 DIAGNOSIS — M6281 Muscle weakness (generalized): Secondary | ICD-10-CM | POA: Diagnosis not present

## 2020-11-23 DIAGNOSIS — R269 Unspecified abnormalities of gait and mobility: Secondary | ICD-10-CM | POA: Diagnosis not present

## 2020-11-23 DIAGNOSIS — R2681 Unsteadiness on feet: Secondary | ICD-10-CM | POA: Diagnosis not present

## 2020-11-23 DIAGNOSIS — Z741 Need for assistance with personal care: Secondary | ICD-10-CM | POA: Diagnosis not present

## 2020-11-23 DIAGNOSIS — R279 Unspecified lack of coordination: Secondary | ICD-10-CM | POA: Diagnosis not present

## 2020-11-24 DIAGNOSIS — Z741 Need for assistance with personal care: Secondary | ICD-10-CM | POA: Diagnosis not present

## 2020-11-24 DIAGNOSIS — Z9181 History of falling: Secondary | ICD-10-CM | POA: Diagnosis not present

## 2020-11-24 DIAGNOSIS — R2681 Unsteadiness on feet: Secondary | ICD-10-CM | POA: Diagnosis not present

## 2020-11-24 DIAGNOSIS — R279 Unspecified lack of coordination: Secondary | ICD-10-CM | POA: Diagnosis not present

## 2020-11-24 DIAGNOSIS — M6281 Muscle weakness (generalized): Secondary | ICD-10-CM | POA: Diagnosis not present

## 2020-11-24 DIAGNOSIS — R269 Unspecified abnormalities of gait and mobility: Secondary | ICD-10-CM | POA: Diagnosis not present

## 2020-12-06 DIAGNOSIS — R278 Other lack of coordination: Secondary | ICD-10-CM | POA: Diagnosis not present

## 2020-12-06 DIAGNOSIS — R2689 Other abnormalities of gait and mobility: Secondary | ICD-10-CM | POA: Diagnosis not present

## 2020-12-06 DIAGNOSIS — R296 Repeated falls: Secondary | ICD-10-CM | POA: Diagnosis not present

## 2020-12-06 DIAGNOSIS — M6281 Muscle weakness (generalized): Secondary | ICD-10-CM | POA: Diagnosis not present

## 2020-12-07 DIAGNOSIS — M6281 Muscle weakness (generalized): Secondary | ICD-10-CM | POA: Diagnosis not present

## 2020-12-07 DIAGNOSIS — R278 Other lack of coordination: Secondary | ICD-10-CM | POA: Diagnosis not present

## 2020-12-07 DIAGNOSIS — R2689 Other abnormalities of gait and mobility: Secondary | ICD-10-CM | POA: Diagnosis not present

## 2020-12-07 DIAGNOSIS — R296 Repeated falls: Secondary | ICD-10-CM | POA: Diagnosis not present

## 2020-12-08 DIAGNOSIS — R278 Other lack of coordination: Secondary | ICD-10-CM | POA: Diagnosis not present

## 2020-12-08 DIAGNOSIS — R2689 Other abnormalities of gait and mobility: Secondary | ICD-10-CM | POA: Diagnosis not present

## 2020-12-08 DIAGNOSIS — M6281 Muscle weakness (generalized): Secondary | ICD-10-CM | POA: Diagnosis not present

## 2020-12-08 DIAGNOSIS — R296 Repeated falls: Secondary | ICD-10-CM | POA: Diagnosis not present

## 2020-12-12 DIAGNOSIS — R278 Other lack of coordination: Secondary | ICD-10-CM | POA: Diagnosis not present

## 2020-12-12 DIAGNOSIS — R2689 Other abnormalities of gait and mobility: Secondary | ICD-10-CM | POA: Diagnosis not present

## 2020-12-12 DIAGNOSIS — R296 Repeated falls: Secondary | ICD-10-CM | POA: Diagnosis not present

## 2020-12-12 DIAGNOSIS — M6281 Muscle weakness (generalized): Secondary | ICD-10-CM | POA: Diagnosis not present

## 2020-12-13 DIAGNOSIS — R296 Repeated falls: Secondary | ICD-10-CM | POA: Diagnosis not present

## 2020-12-13 DIAGNOSIS — R278 Other lack of coordination: Secondary | ICD-10-CM | POA: Diagnosis not present

## 2020-12-13 DIAGNOSIS — M6281 Muscle weakness (generalized): Secondary | ICD-10-CM | POA: Diagnosis not present

## 2020-12-13 DIAGNOSIS — R2689 Other abnormalities of gait and mobility: Secondary | ICD-10-CM | POA: Diagnosis not present

## 2020-12-14 DIAGNOSIS — M6281 Muscle weakness (generalized): Secondary | ICD-10-CM | POA: Diagnosis not present

## 2020-12-14 DIAGNOSIS — J4551 Severe persistent asthma with (acute) exacerbation: Secondary | ICD-10-CM | POA: Diagnosis not present

## 2020-12-14 DIAGNOSIS — R278 Other lack of coordination: Secondary | ICD-10-CM | POA: Diagnosis not present

## 2020-12-14 DIAGNOSIS — J4550 Severe persistent asthma, uncomplicated: Secondary | ICD-10-CM | POA: Diagnosis not present

## 2020-12-14 DIAGNOSIS — R2689 Other abnormalities of gait and mobility: Secondary | ICD-10-CM | POA: Diagnosis not present

## 2020-12-14 DIAGNOSIS — R296 Repeated falls: Secondary | ICD-10-CM | POA: Diagnosis not present

## 2020-12-15 DIAGNOSIS — R2689 Other abnormalities of gait and mobility: Secondary | ICD-10-CM | POA: Diagnosis not present

## 2020-12-15 DIAGNOSIS — R278 Other lack of coordination: Secondary | ICD-10-CM | POA: Diagnosis not present

## 2020-12-15 DIAGNOSIS — M6281 Muscle weakness (generalized): Secondary | ICD-10-CM | POA: Diagnosis not present

## 2020-12-15 DIAGNOSIS — R296 Repeated falls: Secondary | ICD-10-CM | POA: Diagnosis not present

## 2020-12-16 DIAGNOSIS — I10 Essential (primary) hypertension: Secondary | ICD-10-CM | POA: Diagnosis not present

## 2020-12-16 DIAGNOSIS — R278 Other lack of coordination: Secondary | ICD-10-CM | POA: Diagnosis not present

## 2020-12-16 DIAGNOSIS — H401121 Primary open-angle glaucoma, left eye, mild stage: Secondary | ICD-10-CM | POA: Diagnosis not present

## 2020-12-16 DIAGNOSIS — H524 Presbyopia: Secondary | ICD-10-CM | POA: Diagnosis not present

## 2020-12-16 DIAGNOSIS — H52223 Regular astigmatism, bilateral: Secondary | ICD-10-CM | POA: Diagnosis not present

## 2020-12-16 DIAGNOSIS — M6281 Muscle weakness (generalized): Secondary | ICD-10-CM | POA: Diagnosis not present

## 2020-12-16 DIAGNOSIS — H5212 Myopia, left eye: Secondary | ICD-10-CM | POA: Diagnosis not present

## 2020-12-16 DIAGNOSIS — H40013 Open angle with borderline findings, low risk, bilateral: Secondary | ICD-10-CM | POA: Diagnosis not present

## 2020-12-16 DIAGNOSIS — R2689 Other abnormalities of gait and mobility: Secondary | ICD-10-CM | POA: Diagnosis not present

## 2020-12-16 DIAGNOSIS — H35033 Hypertensive retinopathy, bilateral: Secondary | ICD-10-CM | POA: Diagnosis not present

## 2020-12-16 DIAGNOSIS — H401112 Primary open-angle glaucoma, right eye, moderate stage: Secondary | ICD-10-CM | POA: Diagnosis not present

## 2020-12-16 DIAGNOSIS — E119 Type 2 diabetes mellitus without complications: Secondary | ICD-10-CM | POA: Diagnosis not present

## 2020-12-16 DIAGNOSIS — R296 Repeated falls: Secondary | ICD-10-CM | POA: Diagnosis not present

## 2020-12-16 DIAGNOSIS — H3530 Unspecified macular degeneration: Secondary | ICD-10-CM | POA: Diagnosis not present

## 2020-12-19 DIAGNOSIS — M6281 Muscle weakness (generalized): Secondary | ICD-10-CM | POA: Diagnosis not present

## 2020-12-19 DIAGNOSIS — R2689 Other abnormalities of gait and mobility: Secondary | ICD-10-CM | POA: Diagnosis not present

## 2020-12-19 DIAGNOSIS — R296 Repeated falls: Secondary | ICD-10-CM | POA: Diagnosis not present

## 2020-12-19 DIAGNOSIS — R278 Other lack of coordination: Secondary | ICD-10-CM | POA: Diagnosis not present

## 2020-12-21 DIAGNOSIS — R2689 Other abnormalities of gait and mobility: Secondary | ICD-10-CM | POA: Diagnosis not present

## 2020-12-21 DIAGNOSIS — R296 Repeated falls: Secondary | ICD-10-CM | POA: Diagnosis not present

## 2020-12-21 DIAGNOSIS — R278 Other lack of coordination: Secondary | ICD-10-CM | POA: Diagnosis not present

## 2020-12-21 DIAGNOSIS — M6281 Muscle weakness (generalized): Secondary | ICD-10-CM | POA: Diagnosis not present

## 2020-12-23 DIAGNOSIS — R2689 Other abnormalities of gait and mobility: Secondary | ICD-10-CM | POA: Diagnosis not present

## 2020-12-23 DIAGNOSIS — R296 Repeated falls: Secondary | ICD-10-CM | POA: Diagnosis not present

## 2020-12-23 DIAGNOSIS — M6281 Muscle weakness (generalized): Secondary | ICD-10-CM | POA: Diagnosis not present

## 2020-12-23 DIAGNOSIS — R278 Other lack of coordination: Secondary | ICD-10-CM | POA: Diagnosis not present

## 2020-12-29 DIAGNOSIS — M6281 Muscle weakness (generalized): Secondary | ICD-10-CM | POA: Diagnosis not present

## 2020-12-29 DIAGNOSIS — R296 Repeated falls: Secondary | ICD-10-CM | POA: Diagnosis not present

## 2020-12-29 DIAGNOSIS — R278 Other lack of coordination: Secondary | ICD-10-CM | POA: Diagnosis not present

## 2020-12-29 DIAGNOSIS — R2689 Other abnormalities of gait and mobility: Secondary | ICD-10-CM | POA: Diagnosis not present

## 2021-01-02 DIAGNOSIS — M6281 Muscle weakness (generalized): Secondary | ICD-10-CM | POA: Diagnosis not present

## 2021-01-02 DIAGNOSIS — R2689 Other abnormalities of gait and mobility: Secondary | ICD-10-CM | POA: Diagnosis not present

## 2021-01-02 DIAGNOSIS — R296 Repeated falls: Secondary | ICD-10-CM | POA: Diagnosis not present

## 2021-01-02 DIAGNOSIS — R278 Other lack of coordination: Secondary | ICD-10-CM | POA: Diagnosis not present

## 2021-01-03 DIAGNOSIS — R278 Other lack of coordination: Secondary | ICD-10-CM | POA: Diagnosis not present

## 2021-01-03 DIAGNOSIS — R296 Repeated falls: Secondary | ICD-10-CM | POA: Diagnosis not present

## 2021-01-03 DIAGNOSIS — R2689 Other abnormalities of gait and mobility: Secondary | ICD-10-CM | POA: Diagnosis not present

## 2021-01-03 DIAGNOSIS — M6281 Muscle weakness (generalized): Secondary | ICD-10-CM | POA: Diagnosis not present

## 2021-01-05 DIAGNOSIS — R278 Other lack of coordination: Secondary | ICD-10-CM | POA: Diagnosis not present

## 2021-01-05 DIAGNOSIS — M6281 Muscle weakness (generalized): Secondary | ICD-10-CM | POA: Diagnosis not present

## 2021-01-05 DIAGNOSIS — R296 Repeated falls: Secondary | ICD-10-CM | POA: Diagnosis not present

## 2021-01-05 DIAGNOSIS — R2689 Other abnormalities of gait and mobility: Secondary | ICD-10-CM | POA: Diagnosis not present

## 2021-01-10 DIAGNOSIS — M6281 Muscle weakness (generalized): Secondary | ICD-10-CM | POA: Diagnosis not present

## 2021-01-10 DIAGNOSIS — R2689 Other abnormalities of gait and mobility: Secondary | ICD-10-CM | POA: Diagnosis not present

## 2021-01-10 DIAGNOSIS — R296 Repeated falls: Secondary | ICD-10-CM | POA: Diagnosis not present

## 2021-01-13 DIAGNOSIS — M6281 Muscle weakness (generalized): Secondary | ICD-10-CM | POA: Diagnosis not present

## 2021-01-13 DIAGNOSIS — R296 Repeated falls: Secondary | ICD-10-CM | POA: Diagnosis not present

## 2021-01-13 DIAGNOSIS — R2689 Other abnormalities of gait and mobility: Secondary | ICD-10-CM | POA: Diagnosis not present

## 2021-01-17 DIAGNOSIS — M6281 Muscle weakness (generalized): Secondary | ICD-10-CM | POA: Diagnosis not present

## 2021-01-17 DIAGNOSIS — R2689 Other abnormalities of gait and mobility: Secondary | ICD-10-CM | POA: Diagnosis not present

## 2021-01-17 DIAGNOSIS — R296 Repeated falls: Secondary | ICD-10-CM | POA: Diagnosis not present

## 2021-01-18 DIAGNOSIS — R296 Repeated falls: Secondary | ICD-10-CM | POA: Diagnosis not present

## 2021-01-18 DIAGNOSIS — R2689 Other abnormalities of gait and mobility: Secondary | ICD-10-CM | POA: Diagnosis not present

## 2021-01-18 DIAGNOSIS — M6281 Muscle weakness (generalized): Secondary | ICD-10-CM | POA: Diagnosis not present

## 2021-01-23 DIAGNOSIS — M1711 Unilateral primary osteoarthritis, right knee: Secondary | ICD-10-CM | POA: Diagnosis not present

## 2021-01-24 DIAGNOSIS — R2689 Other abnormalities of gait and mobility: Secondary | ICD-10-CM | POA: Diagnosis not present

## 2021-01-24 DIAGNOSIS — R296 Repeated falls: Secondary | ICD-10-CM | POA: Diagnosis not present

## 2021-01-24 DIAGNOSIS — M6281 Muscle weakness (generalized): Secondary | ICD-10-CM | POA: Diagnosis not present

## 2021-01-25 ENCOUNTER — Ambulatory Visit (INDEPENDENT_AMBULATORY_CARE_PROVIDER_SITE_OTHER): Payer: Medicare Other | Admitting: Neurology

## 2021-01-25 VITALS — BP 174/78 | HR 86 | Ht 64.0 in

## 2021-01-25 DIAGNOSIS — M542 Cervicalgia: Secondary | ICD-10-CM

## 2021-01-25 DIAGNOSIS — G243 Spasmodic torticollis: Secondary | ICD-10-CM

## 2021-01-25 MED ORDER — ONABOTULINUMTOXINA 100 UNITS IJ SOLR
100.0000 [IU] | Freq: Once | INTRAMUSCULAR | Status: AC
  Administered 2021-01-25: 100 [IU] via INTRAMUSCULAR

## 2021-01-25 NOTE — Progress Notes (Signed)
Botox- 100 units x 1 vial Lot: [id number] Expiration: 10/2022 NDC: [phone]    b/b

## 2021-01-25 NOTE — Progress Notes (Signed)
PATIENT: Wendy Velasquez DOB: 02/18/1926  Chief Complaint  Patient presents with   Procedure    Botox RM with daughter  Pt is      HISTORICAL  Wendy Velasquez is a 87 years old female, seen in request by her primary care physician Dr. Osborne Casco, Fransico Him for evaluation of neck pain, headaches, initial evaluation was on December 05, 2017.  She is accompanied by her daughter at today's clinical visit.  I have reviewed and summarized the referring note from the referring physician, she has past medical history of diabetes, use insulin, history of cardiac block, status post pacemaker, hyperlipidemia,  She had a history of chronic neck pain, had acute worsening since March 2018, she woke up one morning, noticed upper neck throbbing pain, radiating pain to her skull, has been persistent since then, over the last 1 year, she was seen by orthopedic PA, Ronette Deter, was treated with multiple trigger point injection, with mixture of 1% lidocaine, and Depo-Medrol, most recent injection was on October 10, 2017, on a monthly basis, patient denies significant improvement, also complains of worsening glucose control after injection,  She denies significant radiating pain to bilateral shoulder upper extremity, denies difficulty using arms, she had chronic urinary incontinence is receiving Botox injection by urologist, she contributed to her gait abnormality to her bilateral knee pain, chronic arthritis,  She now complains of difficulty turning her neck, 6 out of 10 constant deep achy pain, multiple tender spots at cervical, upper trapezius, oftentimes she has transient sharp radiating range of motion, difficulty turning her neck,  She also tried physical therapy, is doing morning neck traction regularly,  UPDATE Jan 02 2018: She continue complains of significant neck pain, 8 out of 10 sometimes, radiating pain to bilateral occipital region despite gabapentin 100 mg 5 tablets each day, I have  personally reviewed CT cervical spine on December 10, 2017, no acute abnormality, multilevel spondylolisthesis, maximum at C4-5, with associated   facet arthropathy, evidence of ankylosis, mild spinal stenosis at the C2-3, C3-4 level  UPDATE Apr 08 2018: She responded very well to her initial injection January 02, 2018, we used 100 units of Botox, a month after injection she was pain-free for 2 months, there was no significant side effect noticed, in the past 2 weeks, she noticed returning of her neck pain, centered at bilateral mastoid process, nuchal line, tenderness upon deep palpitation.  UPDATE August 12 2018: She responded very well to previous injection  UPDATE Sept 9 2020: She responded well to previous injection, we used Botox a 100 units, is take about 3 days for the benefit to kicking, lasting for 2 to 3 months, no significant side effect.  UPDATE Feb 25 2019: She responded well to previous injection  UPDATE June 01 2019: She suffered COVID-29 January 2020, required hospitalization but no intubation, she complains of increased bilateral upper back pain, neck pain.  Update September 09 2019 She complains of worsening neck pain, she was admitted to hospital in early February for acute hypoxic respiratory failure due to COVID-19 pneumonia, she was treated with remdesivir, did not require intubation, however, since the infection, she complains of declining of her status, could no longer walking, rely on wheelchair, fatigue,  UPDATE Set 29 2021: She is with her daughter at today's clinical visit, suffered pelvic fracture, Botox low dose 100 units continue to help her neck muscle pain, spasm  UPDATE Mar 23 2020: She is accompanied by her daughter at today's clinical visit,  overall doing better, previous Botox injection 100 units has really helped her neck pain, is receiving physical therapy  Update Jul 20, 2020: She responded well to previous injection, recent hospital for aspiration  pneumonia, complains of increased neck pain, essentially wheelchair-bound  Updated January 25, 2021: Previous injection has been helpful, warm compression to her neck also helped her neck pain, she complains of bilateral hands numbness tingling, achy pain, there is significant hands joints deformity and tenderness upon deep palpitation, her complains of bilateral hands pain, paresthesia are likely combination of musculoskeletal etiology, cervical radiculopathy.  REVIEW OF SYSTEMS: Full 14 system review of systems performed and notable only for as above. ALLERGIES: No Known Allergies  HOME MEDICATIONS: Current Outpatient Medications  Medication Sig Dispense Refill   acetaminophen (TYLENOL) 325 MG tablet Take 650 mg by mouth every 6 (six) hours as needed.     albuterol (PROVENTIL) (2.5 MG/3ML) 0.083% nebulizer solution Take 3 mLs (2.5 mg total) by nebulization every 6 (six) hours as needed for wheezing or shortness of breath. 75 mL 0   aspirin 81 MG chewable tablet Chew 81 mg by mouth daily.     botulinum toxin Type A (BOTOX) 100 units SOLR injection Inject 100 Units into the muscle every 3 (three) months.     Brinzolamide-Brimonidine (SIMBRINZA) 1-0.2 % SUSP Apply 1 drop to eye at bedtime. (Patient taking differently: Place 1 drop into both eyes at bedtime.) 8 mL 0   cetirizine (ZYRTEC) 10 MG tablet Take 10 mg by mouth daily.     Cholecalciferol (VITAMIN D) 50 MCG (2000 UT) tablet Take 2,000 Units by mouth daily.     esomeprazole (NEXIUM) 40 MG capsule Take 40 mg by mouth daily at 12 noon.     famotidine (PEPCID) 20 MG tablet Take 20 mg by mouth daily as needed for heartburn or indigestion.     fluticasone (FLONASE) 50 MCG/ACT nasal spray Place 2 sprays into both nostrils daily as needed for allergies. SHAKE AS DIRECTED 16 g 0   Fluticasone-Salmeterol (ADVAIR) 250-50 MCG/DOSE AEPB Inhale 1 puff into the lungs 2 (two) times daily.     gabapentin (NEURONTIN) 100 MG capsule Take 1 capsule (100 mg  total) by mouth at bedtime. 30 capsule 0   ibandronate (BONIVA) 150 MG tablet Take 1 tablet (150 mg total) by mouth every 30 (thirty) days. Take in the morning with a full glass of water, on an empty stomach, and do not take anything else by mouth or lie down for the next 30 min. 1 tablet 0   Ipratropium-Albuterol (COMBIVENT RESPIMAT) 20-100 MCG/ACT AERS respimat Inhale 1 puff into the lungs every 8 (eight) hours as needed for wheezing. 4 g 0   LANTUS SOLOSTAR 100 UNIT/ML Solostar Pen Inject 8 Units into the skin at bedtime.     latanoprost (XALATAN) 0.005 % ophthalmic solution Place 1 drop into both eyes at bedtime.     lidocaine-prilocaine (EMLA) cream Apply 1 application topically 4 (four) times daily as needed. (Patient taking differently: Apply 1 application topically 4 (four) times daily as needed (pain).) 30 g 0   Menthol, Topical Analgesic, (BIOFREEZE) 4 % GEL Apply 1 application topically daily as needed (pain). Apply to Right Leg.     metFORMIN (GLUCOPHAGE) 1000 MG tablet Take 1 tablet (1,000 mg total) by mouth 2 (two) times daily with a meal. 60 tablet 0   MYRBETRIQ 25 MG TB24 tablet Take 1 tablet (25 mg total) by mouth daily. 30 tablet 0   sertraline (  ZOLOFT) 50 MG tablet Take 1 tablet (50 mg total) by mouth 2 (two) times daily. (Patient taking differently: Take 50 mg by mouth daily.) 60 tablet 0   vitamin B-12 (CYANOCOBALAMIN) 1000 MCG tablet Take 1 tablet (1,000 mcg total) by mouth daily. 30 tablet 0   No current facility-administered medications for this visit.    PAST MEDICAL HISTORY: Past Medical History:  Diagnosis Date   Asthma    Cardiac pacemaker in situ    Complete heart block (HCC)    COPD (chronic obstructive pulmonary disease) (HCC)    Diverticulosis of colon    GERD (gastroesophageal reflux disease)    Glaucoma of both eyes    Heart disease    History of cardiac arrest    09-11-2012--  symptomatic complete heart block -- hr 20's--  cpr, intubated and temp. pacer  until  perm. pacemaker placement   History of esophageal dilatation    for stricture 2007   History of hiatal hernia    History of iron deficiency anemia    hx transfusion's 15 yrs ago , approx 2001   Hyperlipidemia    Hypertension    Neck pain    Peripheral neuropathy    Tachy-brady syndrome (HCC)    Type 2 diabetes mellitus ([place])    Urgency incontinence    Botox injections for bladder.   Wears glasses     PAST SURGICAL HISTORY: Past Surgical History:  Procedure Laterality Date   ABDOMINAL HYSTERECTOMY  1976  approx   w/ unilateral salpingoophorectomy   CATARACT EXTRACTION W/ INTRAOCULAR LENS  IMPLANT, BILATERAL  1990   COLONOSCOPY WITH ESOPHAGOGASTRODUODENOSCOPY (EGD)  last one 2007   INTERSTIM IMPLANT PLACEMENT  2012   INTERSTIM IMPLANT REMOVAL N/A 09/21/2014   Procedure: REMOVAL OF INTERSTIM IMPLANT;  Surgeon: Bjorn Loser, MD;  Location: [facility];  Service: Urology;  Laterality: N/A;   INTERSTIM IMPLANT REVISION N/A 09/21/2014   Procedure: Barrie Lyme STAGE ONE AND TWO;  Surgeon: Bjorn Loser, MD;  Location: [facility];  Service: Urology;  Laterality: N/A;   LAPAROTOMY W/ UNILATERAL SALPINGOOPHORECTOMY  1960's  approx   PERMANENT PACEMAKER INSERTION N/A 09/11/2012   Procedure: PERMANENT PACEMAKER INSERTION;  Surgeon: Evans Lance, MD;  Location: [facility] CATH LAB;  Service: Cardiovascular;  Laterality: N/A;  St. Jude Dual Chamber   RECTOCELE REPAIR  1991  approx    FAMILY HISTORY: Family History  Problem Relation Age of Onset   Other Mother        died during her childbirth   Prostatitis Father    Heart failure Sister    Breast cancer Sister    Diabetic kidney disease Sister    Melanoma Brother     SOCIAL HISTORY: Social History   Socioeconomic History   Marital status: Married    Spouse name: Not on file   Number of children: 3   Years of education: 12   Highest education level: High school graduate  Occupational History    Occupation: Retired  Tobacco Use   Smoking status: Never   Smokeless tobacco: Never  Scientific laboratory technician Use: Never used  Substance and Sexual Activity   Alcohol use: No   Drug use: No   Sexual activity: Not Currently    Birth control/protection: None  Other Topics Concern   Not on file  Social History Narrative   Lives with her husband in an independent living facility.   Right-handed.   1-2 cup caffeine daily.  Social Determinants of Health   Financial Resource Strain: Not on file  Food Insecurity: Not on file  Transportation Needs: Not on file  Physical Activity: Not on file  Stress: Not on file  Social Connections: Not on file  Intimate Partner Violence: Not on file     PHYSICAL EXAM  NEUROLOGICAL EXAM: Multiple posterior neck muscle spasm, painful upon deep palpitation, especially at bilateral levator scapula, latissimus longus, scapular cervix, tendency to shrug bilateral shoulders  DIAGNOSTIC DATA (LABS, IMAGING, TESTING) - I reviewed patient records, labs, notes, testing and imaging myself where available.   ASSESSMENT AND PLAN  Wendy Velasquez is a 87 y.o. female    Chronic neck pain, headaches Abnormal neck posturing, mild retrocollis, left tilt, significant tenderness at bilateral lateral cervical paraspinal muscles, bilateral levator scapular, tendency to shrug bilateral shoulders  Worsening gait abnormalities  She has significant hyperreflexia on examination,  CT cervical showed evidence of multilevel spondylolisthesis, maximum at C4-5 with associated facet arthropathy, developing ankylosis  Patient and her family do not want pursue further evaluation at this point   EMG guided Botox injection Used 100 units of Botox A    Right longissimus capitis 12.5 units Right splenius cervix 12.5 Right semispinalis 12.5 units Right levator scapular 12.5 units Left splenius capitis 12.5 units   Left longissimus capitis 12.5 units Left splenius cervix  12.5 units Left levator scapular 12.5 units     Marcial Pacas, M.D. Ph.D.  [facility] [address], [place], [place] [address] Ph: [phone] Fax: [phone]

## 2021-01-26 DIAGNOSIS — M6281 Muscle weakness (generalized): Secondary | ICD-10-CM | POA: Diagnosis not present

## 2021-01-26 DIAGNOSIS — R2689 Other abnormalities of gait and mobility: Secondary | ICD-10-CM | POA: Diagnosis not present

## 2021-01-26 DIAGNOSIS — R296 Repeated falls: Secondary | ICD-10-CM | POA: Diagnosis not present

## 2021-01-29 DIAGNOSIS — R296 Repeated falls: Secondary | ICD-10-CM | POA: Diagnosis not present

## 2021-01-29 DIAGNOSIS — R2689 Other abnormalities of gait and mobility: Secondary | ICD-10-CM | POA: Diagnosis not present

## 2021-01-29 DIAGNOSIS — M6281 Muscle weakness (generalized): Secondary | ICD-10-CM | POA: Diagnosis not present

## 2021-02-01 DIAGNOSIS — R296 Repeated falls: Secondary | ICD-10-CM | POA: Diagnosis not present

## 2021-02-01 DIAGNOSIS — M6281 Muscle weakness (generalized): Secondary | ICD-10-CM | POA: Diagnosis not present

## 2021-02-01 DIAGNOSIS — R2689 Other abnormalities of gait and mobility: Secondary | ICD-10-CM | POA: Diagnosis not present

## 2021-02-09 DIAGNOSIS — M6281 Muscle weakness (generalized): Secondary | ICD-10-CM | POA: Diagnosis not present

## 2021-02-09 DIAGNOSIS — R2689 Other abnormalities of gait and mobility: Secondary | ICD-10-CM | POA: Diagnosis not present

## 2021-02-09 DIAGNOSIS — R296 Repeated falls: Secondary | ICD-10-CM | POA: Diagnosis not present

## 2021-02-10 ENCOUNTER — Ambulatory Visit (INDEPENDENT_AMBULATORY_CARE_PROVIDER_SITE_OTHER): Payer: Medicare Other

## 2021-02-10 DIAGNOSIS — I442 Atrioventricular block, complete: Secondary | ICD-10-CM | POA: Diagnosis not present

## 2021-02-13 LAB — CUP PACEART REMOTE DEVICE CHECK
Battery Remaining Longevity: 5 mo
Battery Remaining Percentage: 4 %
Battery Voltage: 2.72 V
Brady Statistic AP VP Percent: 1 %
Brady Statistic AP VS Percent: 1 %
Brady Statistic AS VP Percent: 99 %
Brady Statistic AS VS Percent: 1 %
Brady Statistic RA Percent Paced: 1 %
Brady Statistic RV Percent Paced: 99 %
Date Time Interrogation Session: 20221202040032
Implantable Lead Implant Date: 20140703
Implantable Lead Implant Date: 20140703
Implantable Lead Location: 753859
Implantable Lead Location: 753860
Implantable Pulse Generator Implant Date: 20140703
Lead Channel Impedance Value: 450 Ohm
Lead Channel Impedance Value: 680 Ohm
Lead Channel Pacing Threshold Amplitude: 0.5 V
Lead Channel Pacing Threshold Amplitude: 1.25 V
Lead Channel Pacing Threshold Pulse Width: 0.4 ms
Lead Channel Pacing Threshold Pulse Width: 0.4 ms
Lead Channel Sensing Intrinsic Amplitude: 12 mV
Lead Channel Sensing Intrinsic Amplitude: 2.1 mV
Lead Channel Setting Pacing Amplitude: 1.5 V
Lead Channel Setting Pacing Amplitude: 2 V
Lead Channel Setting Pacing Pulse Width: 0.4 ms
Lead Channel Setting Sensing Sensitivity: 12.5 mV
Pulse Gen Model: 2240
Pulse Gen Serial Number: 7522008

## 2021-02-14 DIAGNOSIS — M6281 Muscle weakness (generalized): Secondary | ICD-10-CM | POA: Diagnosis not present

## 2021-02-14 DIAGNOSIS — R2689 Other abnormalities of gait and mobility: Secondary | ICD-10-CM | POA: Diagnosis not present

## 2021-02-14 DIAGNOSIS — R296 Repeated falls: Secondary | ICD-10-CM | POA: Diagnosis not present

## 2021-02-21 NOTE — Progress Notes (Signed)
Remote pacemaker transmission.   

## 2021-02-28 DIAGNOSIS — J449 Chronic obstructive pulmonary disease, unspecified: Secondary | ICD-10-CM | POA: Diagnosis not present

## 2021-02-28 DIAGNOSIS — J4550 Severe persistent asthma, uncomplicated: Secondary | ICD-10-CM | POA: Diagnosis not present

## 2021-02-28 DIAGNOSIS — Z1152 Encounter for screening for COVID-19: Secondary | ICD-10-CM | POA: Diagnosis not present

## 2021-02-28 DIAGNOSIS — R5383 Other fatigue: Secondary | ICD-10-CM | POA: Diagnosis not present

## 2021-02-28 DIAGNOSIS — E1129 Type 2 diabetes mellitus with other diabetic kidney complication: Secondary | ICD-10-CM | POA: Diagnosis not present

## 2021-02-28 DIAGNOSIS — R062 Wheezing: Secondary | ICD-10-CM | POA: Diagnosis not present

## 2021-02-28 DIAGNOSIS — R059 Cough, unspecified: Secondary | ICD-10-CM | POA: Diagnosis not present

## 2021-03-11 DIAGNOSIS — J208 Acute bronchitis due to other specified organisms: Secondary | ICD-10-CM | POA: Diagnosis not present

## 2021-03-13 ENCOUNTER — Other Ambulatory Visit: Payer: Self-pay | Admitting: Family

## 2021-03-13 DIAGNOSIS — J449 Chronic obstructive pulmonary disease, unspecified: Secondary | ICD-10-CM

## 2021-03-14 ENCOUNTER — Ambulatory Visit (INDEPENDENT_AMBULATORY_CARE_PROVIDER_SITE_OTHER): Payer: Medicare Other

## 2021-03-14 DIAGNOSIS — I442 Atrioventricular block, complete: Secondary | ICD-10-CM

## 2021-03-14 LAB — CUP PACEART REMOTE DEVICE CHECK
Battery Remaining Longevity: 4 mo
Battery Remaining Percentage: 3 %
Battery Voltage: 2.69 V
Brady Statistic AP VP Percent: 1 %
Brady Statistic AP VS Percent: 1 %
Brady Statistic AS VP Percent: 99 %
Brady Statistic AS VS Percent: 1 %
Brady Statistic RA Percent Paced: 1 %
Brady Statistic RV Percent Paced: 99 %
Date Time Interrogation Session: 20230103020013
Implantable Lead Implant Date: 20140703
Implantable Lead Implant Date: 20140703
Implantable Lead Location: 753859
Implantable Lead Location: 753860
Implantable Pulse Generator Implant Date: 20140703
Lead Channel Impedance Value: 460 Ohm
Lead Channel Impedance Value: 650 Ohm
Lead Channel Pacing Threshold Amplitude: 0.5 V
Lead Channel Pacing Threshold Amplitude: 1.125 V
Lead Channel Pacing Threshold Pulse Width: 0.4 ms
Lead Channel Pacing Threshold Pulse Width: 0.4 ms
Lead Channel Sensing Intrinsic Amplitude: 1.8 mV
Lead Channel Sensing Intrinsic Amplitude: 12 mV
Lead Channel Setting Pacing Amplitude: 1.375
Lead Channel Setting Pacing Amplitude: 2 V
Lead Channel Setting Pacing Pulse Width: 0.4 ms
Lead Channel Setting Sensing Sensitivity: 12.5 mV
Pulse Gen Model: 2240
Pulse Gen Serial Number: 7522008

## 2021-03-22 NOTE — Progress Notes (Signed)
Remote pacemaker transmission.   

## 2021-04-11 DIAGNOSIS — E1129 Type 2 diabetes mellitus with other diabetic kidney complication: Secondary | ICD-10-CM | POA: Diagnosis not present

## 2021-04-11 DIAGNOSIS — I739 Peripheral vascular disease, unspecified: Secondary | ICD-10-CM | POA: Diagnosis not present

## 2021-04-11 DIAGNOSIS — M8448XD Pathological fracture, other site, subsequent encounter for fracture with routine healing: Secondary | ICD-10-CM | POA: Diagnosis not present

## 2021-04-11 DIAGNOSIS — N1831 Chronic kidney disease, stage 3a: Secondary | ICD-10-CM | POA: Diagnosis not present

## 2021-04-11 DIAGNOSIS — I700 Atherosclerosis of aorta: Secondary | ICD-10-CM | POA: Diagnosis not present

## 2021-04-11 DIAGNOSIS — J449 Chronic obstructive pulmonary disease, unspecified: Secondary | ICD-10-CM | POA: Diagnosis not present

## 2021-04-11 DIAGNOSIS — E559 Vitamin D deficiency, unspecified: Secondary | ICD-10-CM | POA: Diagnosis not present

## 2021-04-11 DIAGNOSIS — E7800 Pure hypercholesterolemia, unspecified: Secondary | ICD-10-CM | POA: Diagnosis not present

## 2021-04-11 DIAGNOSIS — F3341 Major depressive disorder, recurrent, in partial remission: Secondary | ICD-10-CM | POA: Diagnosis not present

## 2021-04-11 DIAGNOSIS — J4550 Severe persistent asthma, uncomplicated: Secondary | ICD-10-CM | POA: Diagnosis not present

## 2021-04-11 DIAGNOSIS — Z1331 Encounter for screening for depression: Secondary | ICD-10-CM | POA: Diagnosis not present

## 2021-04-11 DIAGNOSIS — Z1339 Encounter for screening examination for other mental health and behavioral disorders: Secondary | ICD-10-CM | POA: Diagnosis not present

## 2021-04-11 DIAGNOSIS — M810 Age-related osteoporosis without current pathological fracture: Secondary | ICD-10-CM | POA: Diagnosis not present

## 2021-04-11 DIAGNOSIS — I129 Hypertensive chronic kidney disease with stage 1 through stage 4 chronic kidney disease, or unspecified chronic kidney disease: Secondary | ICD-10-CM | POA: Diagnosis not present

## 2021-04-14 ENCOUNTER — Ambulatory Visit (INDEPENDENT_AMBULATORY_CARE_PROVIDER_SITE_OTHER): Payer: Medicare Other

## 2021-04-14 DIAGNOSIS — I442 Atrioventricular block, complete: Secondary | ICD-10-CM

## 2021-04-14 LAB — CUP PACEART REMOTE DEVICE CHECK
Battery Remaining Longevity: 1 mo
Battery Remaining Percentage: 2 %
Battery Voltage: 2.66 V
Brady Statistic AP VP Percent: 1 %
Brady Statistic AP VS Percent: 1 %
Brady Statistic AS VP Percent: 99 %
Brady Statistic AS VS Percent: 1 %
Brady Statistic RA Percent Paced: 1 %
Brady Statistic RV Percent Paced: 99 %
Date Time Interrogation Session: 20230203040020
Implantable Lead Implant Date: 20140703
Implantable Lead Implant Date: 20140703
Implantable Lead Location: 753859
Implantable Lead Location: 753860
Implantable Pulse Generator Implant Date: 20140703
Lead Channel Impedance Value: 440 Ohm
Lead Channel Impedance Value: 640 Ohm
Lead Channel Pacing Threshold Amplitude: 0.5 V
Lead Channel Pacing Threshold Amplitude: 1.375 V
Lead Channel Pacing Threshold Pulse Width: 0.4 ms
Lead Channel Pacing Threshold Pulse Width: 0.4 ms
Lead Channel Sensing Intrinsic Amplitude: 1.9 mV
Lead Channel Sensing Intrinsic Amplitude: 12 mV
Lead Channel Setting Pacing Amplitude: 1.625
Lead Channel Setting Pacing Amplitude: 2 V
Lead Channel Setting Pacing Pulse Width: 0.4 ms
Lead Channel Setting Sensing Sensitivity: 12.5 mV
Pulse Gen Model: 2240
Pulse Gen Serial Number: 7522008

## 2021-04-19 NOTE — Progress Notes (Signed)
Remote ICD transmission.   

## 2021-04-26 ENCOUNTER — Encounter: Payer: Self-pay | Admitting: Neurology

## 2021-04-26 ENCOUNTER — Ambulatory Visit (INDEPENDENT_AMBULATORY_CARE_PROVIDER_SITE_OTHER): Payer: Medicare Other | Admitting: Neurology

## 2021-04-26 VITALS — BP 131/71 | HR 76

## 2021-04-26 DIAGNOSIS — G243 Spasmodic torticollis: Secondary | ICD-10-CM | POA: Diagnosis not present

## 2021-04-26 DIAGNOSIS — M542 Cervicalgia: Secondary | ICD-10-CM

## 2021-04-26 NOTE — Progress Notes (Signed)
PATIENT: Wendy Velasquez DOB: 1925/08/01  Chief Complaint  Patient presents with   Botulinum Toxin Injection    Rm 15 with daughter here for botox injections.     HISTORICAL  Wendy Velasquez is a 88 years old female, seen in request by her primary care physician Dr. Osborne Casco, Fransico Him for evaluation of neck pain, headaches, initial evaluation was on December 05, 2017.  She is accompanied by her daughter at today's clinical visit.  I have reviewed and summarized the referring note from the referring physician, she has past medical history of diabetes, use insulin, history of cardiac block, status post pacemaker, hyperlipidemia,  She had a history of chronic neck pain, had acute worsening since March 2018, she woke up one morning, noticed upper neck throbbing pain, radiating pain to her skull, has been persistent since then, over the last 1 year, she was seen by orthopedic PA, Ronette Deter, was treated with multiple trigger point injection, with mixture of 1% lidocaine, and Depo-Medrol, most recent injection was on October 10, 2017, on a monthly basis, patient denies significant improvement, also complains of worsening glucose control after injection,  She denies significant radiating pain to bilateral shoulder upper extremity, denies difficulty using arms, she had chronic urinary incontinence is receiving Botox injection by urologist, she contributed to her gait abnormality to her bilateral knee pain, chronic arthritis,  She now complains of difficulty turning her neck, 6 out of 10 constant deep achy pain, multiple tender spots at cervical, upper trapezius, oftentimes she has transient sharp radiating range of motion, difficulty turning her neck,  She also tried physical therapy, is doing morning neck traction regularly,  UPDATE Jan 02 2018: She continue complains of significant neck pain, 8 out of 10 sometimes, radiating pain to bilateral occipital region despite gabapentin 100 mg 5  tablets each day, I have personally reviewed CT cervical spine on December 10, 2017, no acute abnormality, multilevel spondylolisthesis, maximum at C4-5, with associated   facet arthropathy, evidence of ankylosis, mild spinal stenosis at the C2-3, C3-4 level  UPDATE Apr 08 2018: She responded very well to her initial injection January 02, 2018, we used 100 units of Botox, a month after injection she was pain-free for 2 months, there was no significant side effect noticed, in the past 2 weeks, she noticed returning of her neck pain, centered at bilateral mastoid process, nuchal line, tenderness upon deep palpitation.  UPDATE August 12 2018: She responded very well to previous injection  UPDATE Sept 9 2020: She responded well to previous injection, we used Botox a 100 units, is take about 3 days for the benefit to kicking, lasting for 2 to 3 months, no significant side effect.  UPDATE Feb 25 2019: She responded well to previous injection  UPDATE June 01 2019: She suffered COVID-29 January 2020, required hospitalization but no intubation, she complains of increased bilateral upper back pain, neck pain.  Update September 09 2019 She complains of worsening neck pain, she was admitted to hospital in early February for acute hypoxic respiratory failure due to COVID-19 pneumonia, she was treated with remdesivir, did not require intubation, however, since the infection, she complains of declining of her status, could no longer walking, rely on wheelchair, fatigue,  UPDATE Set 29 2021: She is with her daughter at today's clinical visit, suffered pelvic fracture, Botox low dose 100 units continue to help her neck muscle pain, spasm  UPDATE Mar 23 2020: She is accompanied by her daughter at today's  clinical visit, overall doing better, previous Botox injection 100 units has really helped her neck pain, is receiving physical therapy  Update Jul 20, 2020: She responded well to previous injection, recent  hospital for aspiration pneumonia, complains of increased neck pain, essentially wheelchair-bound  Updated January 25, 2021: Previous injection has been helpful, warm compression to her neck also helped her neck pain, she complains of bilateral hands numbness tingling, achy pain, there is significant hands joints deformity and tenderness upon deep palpitation, her complains of bilateral hands pain, paresthesia are likely combination of musculoskeletal etiology, cervical radiculopathy.  Update April 26, 2021: She is accompanied by her daughter at today's clinical visit, previous injection was helpful, no significant side effect noted, she is no longer ambulatory, spent most of the time in her wheelchair  ASSESSMENT AND PLAN  Wendy Velasquez is a 88 y.o. female    Chronic neck pain, headaches Abnormal neck posturing, mild retrocollis, left tilt, significant tenderness at bilateral lateral cervical paraspinal muscles, bilateral levator scapular, tendency to shrug bilateral shoulders  Worsening gait abnormalities  She has significant hyperreflexia on examination,  CT cervical showed evidence of multilevel spondylolisthesis, maximum at C4-5 with associated facet arthropathy, developing ankylosis  Patient and her family do not want pursue further evaluation at this point   EMG guided Botox injection Used 100 units of Botox A    Right longissimus capitis 12.5 units Right splenius cervix 12.5 Right semispinalis 12.5 units Right levator scapular 12.5 units Left splenius capitis 12.5 units   Left semispinalis12.5 units Left splenius cervix 12.5 units Left levator scapular 12.5 units     Marcial Pacas, M.D. Ph.D.  [facility] [address] [place], [place] [address] Ph: [phone] Fax: [phone]

## 2021-04-26 NOTE — Progress Notes (Signed)
Botox- 100 units x 1 vial Lot: [id number] Expiration: 04/2023 NDC: 6599-3570-17  B/B

## 2021-04-30 DIAGNOSIS — G243 Spasmodic torticollis: Secondary | ICD-10-CM | POA: Diagnosis not present

## 2021-04-30 MED ORDER — ONABOTULINUMTOXINA 100 UNITS IJ SOLR
100.0000 [IU] | Freq: Once | INTRAMUSCULAR | Status: AC
  Administered 2021-04-30: 100 [IU] via INTRAMUSCULAR

## 2021-05-17 DIAGNOSIS — M170 Bilateral primary osteoarthritis of knee: Secondary | ICD-10-CM | POA: Diagnosis not present

## 2021-05-17 DIAGNOSIS — M1711 Unilateral primary osteoarthritis, right knee: Secondary | ICD-10-CM | POA: Diagnosis not present

## 2021-05-17 DIAGNOSIS — M25561 Pain in right knee: Secondary | ICD-10-CM | POA: Diagnosis not present

## 2021-05-17 DIAGNOSIS — M1712 Unilateral primary osteoarthritis, left knee: Secondary | ICD-10-CM | POA: Diagnosis not present

## 2021-05-17 DIAGNOSIS — M25562 Pain in left knee: Secondary | ICD-10-CM | POA: Diagnosis not present

## 2021-06-15 ENCOUNTER — Ambulatory Visit (INDEPENDENT_AMBULATORY_CARE_PROVIDER_SITE_OTHER): Payer: Medicare Other

## 2021-06-15 ENCOUNTER — Telehealth: Payer: Self-pay

## 2021-06-15 DIAGNOSIS — I442 Atrioventricular block, complete: Secondary | ICD-10-CM

## 2021-06-15 LAB — CUP PACEART REMOTE DEVICE CHECK
Battery Remaining Longevity: 1 mo
Battery Remaining Percentage: 0.5 %
Battery Voltage: 2.6 V
Brady Statistic AP VP Percent: 1 %
Brady Statistic AP VS Percent: 1 %
Brady Statistic AS VP Percent: 99 %
Brady Statistic AS VS Percent: 1 %
Brady Statistic RA Percent Paced: 1 %
Brady Statistic RV Percent Paced: 99 %
Date Time Interrogation Session: 20230406020013
Implantable Lead Implant Date: 20140703
Implantable Lead Implant Date: 20140703
Implantable Lead Location: 753859
Implantable Lead Location: 753860
Implantable Pulse Generator Implant Date: 20140703
Lead Channel Impedance Value: 440 Ohm
Lead Channel Impedance Value: 660 Ohm
Lead Channel Pacing Threshold Amplitude: 0.5 V
Lead Channel Pacing Threshold Amplitude: 1.625 V
Lead Channel Pacing Threshold Pulse Width: 0.4 ms
Lead Channel Pacing Threshold Pulse Width: 0.4 ms
Lead Channel Sensing Intrinsic Amplitude: 12 mV
Lead Channel Sensing Intrinsic Amplitude: 2.1 mV
Lead Channel Setting Pacing Amplitude: 1.875
Lead Channel Setting Pacing Amplitude: 2 V
Lead Channel Setting Pacing Pulse Width: 0.4 ms
Lead Channel Setting Sensing Sensitivity: 12.5 mV
Pulse Gen Model: 2240
Pulse Gen Serial Number: 7522008

## 2021-06-15 NOTE — Telephone Encounter (Signed)
Scheduled remote reviewed. Normal device function.   ?The battery voltage is 2.60 volts, ERI is 2.60 volts, the device is at ERI.  Sent to triage. ?The atrial arrhythmias are less than one minute. ?Next remote 07/17/2021. ? ?Kathy Breach, RN, CCDS, [employer] ? ?Unsuccessful telephone encounter to patient/daughter Ocie Cornfield to discuss device at [facility] and schedule follow up appointment with Dr. Lovena Le as patient has not been seen in clinic since 2019. [employer] compliant VM message left requesting call back to [phone]. ? ? ? ? ?

## 2021-06-16 NOTE — Telephone Encounter (Signed)
Spoke with patients daughter she agreed to apt with Oda Kilts on 07/03/21 at 11:40 ?

## 2021-06-21 DIAGNOSIS — J449 Chronic obstructive pulmonary disease, unspecified: Secondary | ICD-10-CM | POA: Diagnosis not present

## 2021-06-21 DIAGNOSIS — N1831 Chronic kidney disease, stage 3a: Secondary | ICD-10-CM | POA: Diagnosis not present

## 2021-06-21 DIAGNOSIS — R5383 Other fatigue: Secondary | ICD-10-CM | POA: Diagnosis not present

## 2021-06-21 DIAGNOSIS — I129 Hypertensive chronic kidney disease with stage 1 through stage 4 chronic kidney disease, or unspecified chronic kidney disease: Secondary | ICD-10-CM | POA: Diagnosis not present

## 2021-06-21 DIAGNOSIS — Z1152 Encounter for screening for COVID-19: Secondary | ICD-10-CM | POA: Diagnosis not present

## 2021-06-21 DIAGNOSIS — J069 Acute upper respiratory infection, unspecified: Secondary | ICD-10-CM | POA: Diagnosis not present

## 2021-06-21 DIAGNOSIS — J029 Acute pharyngitis, unspecified: Secondary | ICD-10-CM | POA: Diagnosis not present

## 2021-06-21 DIAGNOSIS — R051 Acute cough: Secondary | ICD-10-CM | POA: Diagnosis not present

## 2021-06-21 DIAGNOSIS — R0602 Shortness of breath: Secondary | ICD-10-CM | POA: Diagnosis not present

## 2021-06-21 DIAGNOSIS — J4550 Severe persistent asthma, uncomplicated: Secondary | ICD-10-CM | POA: Diagnosis not present

## 2021-06-26 NOTE — Progress Notes (Signed)
? ? ?Electrophysiology Office Note ?Date: 07/03/2021 ? ?ID:  Wendy Velasquez, DOB Nov 13, 1925, MRN [id number] ? ?PCP: [name], MD ?Primary Cardiologist: None ?Electrophysiologist: Cristopher Peru, MD  ? ?CC: Pacemaker follow-up ? ?Wendy Velasquez is a 88 y.o. female seen today for Cristopher Peru, MD for routine electrophysiology followup.  Since last being seen in our clinic the patient reports doing very well.  she denies chest pain, palpitations, dyspnea, PND, orthopnea, nausea, vomiting, dizziness, syncope, edema, weight gain, or early satiety. ? ?Device History: ?St Jude DDD PPM implanted 2014 for CHB ? ?Past Medical History:  ?Diagnosis Date  ? Asthma   ? Cardiac pacemaker in situ   ? Complete heart block ([place])   ? COPD (chronic obstructive pulmonary disease) ([place])   ? Diverticulosis of colon   ? GERD (gastroesophageal reflux disease)   ? Glaucoma of both eyes   ? Heart disease   ? History of cardiac arrest   ? 09-11-2012--  symptomatic complete heart block -- hr 20's--  cpr, intubated and temp. pacer until  perm. pacemaker placement  ? History of esophageal dilatation   ? for stricture 2007  ? History of hiatal hernia   ? History of iron deficiency anemia   ? hx transfusion's 15 yrs ago , approx 2001  ? Hyperlipidemia   ? Hypertension   ? Neck pain   ? Peripheral neuropathy   ? Tachy-brady syndrome ([place])   ? Type 2 diabetes mellitus ([place])   ? Urgency incontinence   ? Botox injections for bladder.  ? Wears glasses   ? ?Past Surgical History:  ?Procedure Laterality Date  ? ABDOMINAL HYSTERECTOMY  1976  approx  ? w/ unilateral salpingoophorectomy  ? CATARACT EXTRACTION W/ INTRAOCULAR LENS  IMPLANT, BILATERAL  1990  ? COLONOSCOPY WITH ESOPHAGOGASTRODUODENOSCOPY (EGD)  last one 2007  ? INTERSTIM IMPLANT PLACEMENT  2012  ? INTERSTIM IMPLANT REMOVAL N/A 09/21/2014  ? Procedure: REMOVAL OF INTERSTIM IMPLANT;  Surgeon: Bjorn Loser, MD;  Location: [facility];  Service: Urology;  Laterality: N/A;   ? INTERSTIM IMPLANT REVISION N/A 09/21/2014  ? Procedure: INTERSTIM STAGE ONE AND TWO;  Surgeon: Bjorn Loser, MD;  Location: [facility];  Service: Urology;  Laterality: N/A;  ? LAPAROTOMY W/ UNILATERAL SALPINGOOPHORECTOMY  1960's  approx  ? PERMANENT PACEMAKER INSERTION N/A 09/11/2012  ? Procedure: PERMANENT PACEMAKER INSERTION;  Surgeon: Evans Lance, MD;  Location: [facility] CATH LAB;  Service: Cardiovascular;  Laterality: N/A;  St. Jude Dual Chamber  ? [place]  approx  ? ? ?Current Outpatient Medications  ?Medication Sig Dispense Refill  ? acetaminophen (TYLENOL) 325 MG tablet Take 650 mg by mouth every 6 (six) hours as needed.    ? albuterol (PROVENTIL) (2.5 MG/3ML) 0.083% nebulizer solution Take 3 mLs (2.5 mg total) by nebulization every 6 (six) hours as needed for wheezing or shortness of breath. 75 mL 0  ? aspirin 81 MG chewable tablet Chew 81 mg by mouth daily.    ? botulinum toxin Type A (BOTOX) 100 units SOLR injection Inject 100 Units into the muscle every 3 (three) months.    ? Brinzolamide-Brimonidine (SIMBRINZA) 1-0.2 % SUSP Apply 1 drop to eye at bedtime. (Patient taking differently: Place 1 drop into both eyes at bedtime.) 8 mL 0  ? cetirizine (ZYRTEC) 10 MG tablet Take 10 mg by mouth daily.    ? Cholecalciferol (VITAMIN D) 50 MCG (2000 UT) tablet Take 2,000 Units by mouth daily.    ?  esomeprazole (NEXIUM) 40 MG capsule Take 40 mg by mouth daily at 12 noon.    ? famotidine (PEPCID) 20 MG tablet Take 20 mg by mouth daily as needed for heartburn or indigestion.    ? fluticasone (FLONASE) 50 MCG/ACT nasal spray Place 2 sprays into both nostrils daily as needed for allergies. SHAKE AS DIRECTED 16 g 0  ? Fluticasone-Salmeterol (ADVAIR) 250-50 MCG/DOSE AEPB Inhale 1 puff into the lungs 2 (two) times daily.    ? gabapentin (NEURONTIN) 100 MG capsule Take 1 capsule (100 mg total) by mouth at bedtime. 30 capsule 0  ? ibandronate (BONIVA) 150 MG tablet Take 1 tablet (150 mg total)  by mouth every 30 (thirty) days. Take in the morning with a full glass of water, on an empty stomach, and do not take anything else by mouth or lie down for the next 30 min. 1 tablet 0  ? Ipratropium-Albuterol (COMBIVENT RESPIMAT) 20-100 MCG/ACT AERS respimat Inhale 1 puff into the lungs every 8 (eight) hours as needed for wheezing. 4 g 0  ? LANTUS SOLOSTAR 100 UNIT/ML Solostar Pen Inject 8 Units into the skin at bedtime.    ? latanoprost (XALATAN) 0.005 % ophthalmic solution Place 1 drop into both eyes at bedtime.    ? lidocaine-prilocaine (EMLA) cream Apply 1 application topically 4 (four) times daily as needed. (Patient taking differently: Apply 1 application. topically 4 (four) times daily as needed (pain).) 30 g 0  ? Menthol, Topical Analgesic, (BIOFREEZE) 4 % GEL Apply 1 application topically daily as needed (pain). Apply to Right Leg.    ? metFORMIN (GLUCOPHAGE) 1000 MG tablet Take 1 tablet (1,000 mg total) by mouth 2 (two) times daily with a meal. 60 tablet 0  ? MYRBETRIQ 25 MG TB24 tablet Take 1 tablet (25 mg total) by mouth daily. 30 tablet 0  ? sertraline (ZOLOFT) 50 MG tablet Take 1 tablet (50 mg total) by mouth 2 (two) times daily. (Patient taking differently: Take 50 mg by mouth daily.) 60 tablet 0  ? vitamin B-12 (CYANOCOBALAMIN) 1000 MCG tablet Take 1 tablet (1,000 mcg total) by mouth daily. 30 tablet 0  ? ?No current facility-administered medications for this visit.  ? ? ?Allergies:   Patient has no known allergies.  ? ?Social History: ?Social History  ? ?Socioeconomic History  ? Marital status: Married  ?  Spouse name: Not on file  ? Number of children: 3  ? Years of education: 47  ? Highest education level: High school graduate  ?Occupational History  ? Occupation: Retired  ?Tobacco Use  ? Smoking status: Never  ? Smokeless tobacco: Never  ?Vaping Use  ? Vaping Use: Never used  ?Substance and Sexual Activity  ? Alcohol use: No  ? Drug use: No  ? Sexual activity: Not Currently  ?  Birth  control/protection: None  ?Other Topics Concern  ? Not on file  ?Social History Narrative  ? Lives with her husband in an independent living facility.  ? Right-handed.  ? 1-2 cup caffeine daily.  ? ?Social Determinants of Health  ? ?Financial Resource Strain: Not on file  ?Food Insecurity: Not on file  ?Transportation Needs: Not on file  ?Physical Activity: Not on file  ?Stress: Not on file  ?Social Connections: Not on file  ?Intimate Partner Violence: Not on file  ? ? ?Family History: ?Family History  ?Problem Relation Age of Onset  ? Other Mother   ?     died during her childbirth  ? Prostatitis  Father   ? Heart failure Sister   ? Breast cancer Sister   ? Diabetic kidney disease Sister   ? Melanoma Brother   ? ? ? ?Review of Systems: ?All other systems reviewed and are otherwise negative except as noted above. ? ?Physical Exam: ?Vitals:  ? 07/03/21 1134  ?BP: 124/60  ?Pulse: 77  ?SpO2: 98%  ?Weight: 127 lb 6.4 oz (57.8 kg)  ?Height: '5\' 4"'$  (1.626 m)  ?  ? ?GEN- The patient is well appearing, alert and oriented x 3 today.   ?HEENT: normocephalic, atraumatic; sclera clear, conjunctiva pink; hearing intact; oropharynx clear; neck supple  ?Lungs- Clear to ausculation bilaterally, normal work of breathing.  No wheezes, rales, rhonchi ?Heart- Regular rate and rhythm, no murmurs, rubs or gallops  ?GI- soft, non-tender, non-distended, bowel sounds present  ?Extremities- no clubbing or cyanosis. No edema ?MS- no significant deformity or atrophy ?Skin- warm and dry, no rash or lesion; PPM pocket well healed ?Psych- euthymic mood, full affect ?Neuro- strength and sensation are intact ? ?PPM Interrogation- reviewed in detail today,  See PACEART report ? ?EKG:  EKG is ordered today. ?Personal review of ekg ordered  today  shows AS-VP at 77 bpm  ? ?Recent Labs: ?No results found for requested labs within last 8760 hours.  ? ?Wt Readings from Last 3 Encounters:  ?07/03/21 127 lb 6.4 oz (57.8 kg)  ?07/20/20 121 lb 8 oz (55.1 kg)   ?06/02/20 120 lb (54.4 kg)  ?  ? ?Other studies Reviewed: ?Additional studies/ records that were reviewed today include: Previous EP office notes, Previous remote checks, Most recent labwork.  ? ?Assessment and

## 2021-06-30 NOTE — Progress Notes (Signed)
Remote pacemaker transmission.   

## 2021-07-03 ENCOUNTER — Ambulatory Visit (INDEPENDENT_AMBULATORY_CARE_PROVIDER_SITE_OTHER): Payer: Medicare Other | Admitting: Student

## 2021-07-03 VITALS — BP 124/60 | HR 77 | Ht 64.0 in | Wt 127.4 lb

## 2021-07-03 DIAGNOSIS — I10 Essential (primary) hypertension: Secondary | ICD-10-CM

## 2021-07-03 DIAGNOSIS — I442 Atrioventricular block, complete: Secondary | ICD-10-CM | POA: Diagnosis not present

## 2021-07-03 LAB — CBC
Hematocrit: 31.1 % — ABNORMAL LOW (ref 34.0–46.6)
Hemoglobin: 10 g/dL — ABNORMAL LOW (ref 11.1–15.9)
MCH: 25 pg — ABNORMAL LOW (ref 26.6–33.0)
MCHC: 32.2 g/dL (ref 31.5–35.7)
MCV: 78 fL — ABNORMAL LOW (ref 79–97)
Platelets: 306 10*3/uL (ref 150–450)
RBC: 4 x10E6/uL (ref 3.77–5.28)
RDW: 16.8 % — ABNORMAL HIGH (ref 11.7–15.4)
WBC: 10.4 10*3/uL (ref 3.4–10.8)

## 2021-07-03 LAB — BASIC METABOLIC PANEL
BUN/Creatinine Ratio: 24 (ref 12–28)
BUN: 16 mg/dL (ref 10–36)
CO2: 32 mmol/L — ABNORMAL HIGH (ref 20–29)
Calcium: 9.2 mg/dL (ref 8.7–10.3)
Chloride: 100 mmol/L (ref 96–106)
Creatinine, Ser: 0.67 mg/dL (ref 0.57–1.00)
Glucose: 212 mg/dL — ABNORMAL HIGH (ref 70–99)
Potassium: 4.1 mmol/L (ref 3.5–5.2)
Sodium: 138 mmol/L (ref 134–144)
eGFR: 81 mL/min/{1.73_m2} (ref >59)

## 2021-07-03 LAB — CUP PACEART INCLINIC DEVICE CHECK
Battery Remaining Longevity: 0 mo
Battery Voltage: 2.59 V
Brady Statistic RA Percent Paced: 0.3 %
Brady Statistic RV Percent Paced: 99.9 %
Date Time Interrogation Session: 20230424120821
Implantable Lead Implant Date: 20140703
Implantable Lead Implant Date: 20140703
Implantable Lead Location: 753859
Implantable Lead Location: 753860
Implantable Pulse Generator Implant Date: 20140703
Lead Channel Impedance Value: 450 Ohm
Lead Channel Impedance Value: 675 Ohm
Lead Channel Pacing Threshold Amplitude: 0.75 V
Lead Channel Pacing Threshold Amplitude: 0.75 V
Lead Channel Pacing Threshold Amplitude: 1.25 V
Lead Channel Pacing Threshold Pulse Width: 0.4 ms
Lead Channel Pacing Threshold Pulse Width: 0.4 ms
Lead Channel Pacing Threshold Pulse Width: 0.4 ms
Lead Channel Sensing Intrinsic Amplitude: 12 mV
Lead Channel Sensing Intrinsic Amplitude: 2.3 mV
Lead Channel Setting Pacing Amplitude: 1.5 V
Lead Channel Setting Pacing Amplitude: 2 V
Lead Channel Setting Pacing Pulse Width: 0.4 ms
Lead Channel Setting Sensing Sensitivity: 12.5 mV
Pulse Gen Model: 2240
Pulse Gen Serial Number: 7522008

## 2021-07-03 NOTE — Addendum Note (Signed)
Addended by: Carylon Perches on: 07/03/2021 12:30 PM ? ? Modules accepted: Orders ? ?

## 2021-07-03 NOTE — Patient Instructions (Addendum)
Medication Instructions: ?Your physician recommends that you continue on your current medications as directed. Please refer to the Current Medication list given to you today. ? ?Labwork: ?Your physician has recommended that you have lab work today: BMET and CBC ? ?Procedures/Testing: ?Your physician has recommended that you have a Generator Change of your device with Dr. Lovena Le on 07/27/2021. This is a procedure that replaces a Pacemaker ICD generator that is at the end of its service life. The remaining lifespan of a pacemaker is determined during visits to the [facility]. The battery in a pacemaker does not stop suddenly but rather loses its charge slowly, which lets the cardiologist plan the replacement date. ? ?Follow-Up: We will call you to schedule these appointments:  ? ?Your physician recommends that you schedule a follow-up appointment in 69 - 14 days from 07/27/2021 with the Device clinic for a wound check ? ?Your physician recommends that you schedule a follow-up appointment in 3 months from 07/27/2021 with Dr. Lovena Le. ? ? ?If you need a refill on your cardiac medications before your next appointment, please call your pharmacy.  ? ?------------------------------------------------------------------------------------------------------------- ? ?Please wash with the CHG Soap the night before and morning of procedure (follow instruction page "Preparing For Surgery").  ? ?Please report to the [facility] at 5:30am ?([place], [place] [id number]) ? ?DO NOT eat or drink anything after midnight the night before procedure ? ?Hold all morning medications - Take 1/2 dose of Insulin the night before procedure.  ? ?You will need someone to drive you home after the procedure ? ?-------------------------------------------------------------------------------------------------- ?Organ - Preparing For Surgery ? ?Before surgery, you can play an  important role. Because skin is not sterile, your skin needs to be as free of germs as possible. You can reduce the number of germs on your skin by washing with CHG (chlorahexidine gluconate) Soap before surgery.  CHG is an antiseptic cleaner which kills germs and bonds with the skin to continue killing germs even after washing.  ? ?Please do not use if you have an allergy to CHG or antibacterial soaps.  If your skin becomes reddened/irritated stop using the CHG.   ?Do not shave (including legs and underarms) for at least 48 hours prior to first CHG shower.  It is OK to shave your face. ? ?Please follow these instructions carefully: ? 1.  Shower the night before surgery and the morning of surgery with CHG. ? 2.  If you choose to wash your hair, wash your hair first as usual with your normal shampoo. ? 3.  After you shampoo, rinse your hair and body thoroughly to remove the shampoo. ? 4.  Use CHG as you would any other liquid soap.  You can apply CHG directly to the skin and wash gently with a clean washcloth. ?5.  Apply the CHG Soap to your body ONLY FROM THE NECK DOWN.  Do not use on open wounds or open sores.  Avoid contact with your eyes, ears, mouth and genitals (private parts).  Wash genitals (private parts) with your normal soap. ? 6.  Wash thoroughly, paying special attention to the area where your surgery will be performed. ? 7.  Thoroughly rinse your body with warm water from the neck down.  ? 8.  DO NOT shower/wash with your normal soap after using and rinsing off the CHG soap. ? 9.  Pat yourself dry with a clean towel. ?  10.  Wear clean pajamas. ?          11.  Place clean sheets on your bed the night of your first shower and do not sleep with pets. ? ?Day of Surgery: ?Do not apply any deodorants/lotions.  Please wear clean clothes to the hospital/surgery center. ? ? ?

## 2021-07-04 ENCOUNTER — Telehealth: Payer: Self-pay | Admitting: Internal Medicine

## 2021-07-04 NOTE — Telephone Encounter (Signed)
Spoke with Pt's Daughter [name]). Procedure has been moved to 07/20/21 @ 11:30.  ?Cath lab has been made aware.  ?

## 2021-07-04 NOTE — Telephone Encounter (Signed)
Patient's daughter is calling stating she is returning a call she received today in regards to the patient's procedure time change. Unable to find documentation as to who called.  ?

## 2021-07-14 ENCOUNTER — Emergency Department (HOSPITAL_BASED_OUTPATIENT_CLINIC_OR_DEPARTMENT_OTHER)
Admission: EM | Admit: 2021-07-14 | Discharge: 2021-07-14 | Disposition: A | Discharge status: Home / Self Care | Payer: Medicare Other | Attending: Emergency Medicine | Admitting: Emergency Medicine

## 2021-07-14 ENCOUNTER — Telehealth: Payer: Self-pay | Admitting: Internal Medicine

## 2021-07-14 ENCOUNTER — Encounter (HOSPITAL_BASED_OUTPATIENT_CLINIC_OR_DEPARTMENT_OTHER): Payer: Self-pay

## 2021-07-14 ENCOUNTER — Other Ambulatory Visit: Payer: Self-pay

## 2021-07-14 ENCOUNTER — Emergency Department (HOSPITAL_BASED_OUTPATIENT_CLINIC_OR_DEPARTMENT_OTHER): Payer: Medicare Other

## 2021-07-14 DIAGNOSIS — S3992XA Unspecified injury of lower back, initial encounter: Secondary | ICD-10-CM | POA: Diagnosis present

## 2021-07-14 DIAGNOSIS — W19XXXA Unspecified fall, initial encounter: Secondary | ICD-10-CM | POA: Diagnosis not present

## 2021-07-14 DIAGNOSIS — Z043 Encounter for examination and observation following other accident: Secondary | ICD-10-CM | POA: Diagnosis not present

## 2021-07-14 DIAGNOSIS — S22080A Wedge compression fracture of T11-T12 vertebra, initial encounter for closed fracture: Secondary | ICD-10-CM | POA: Diagnosis not present

## 2021-07-14 DIAGNOSIS — S22010A Wedge compression fracture of first thoracic vertebra, initial encounter for closed fracture: Secondary | ICD-10-CM | POA: Diagnosis not present

## 2021-07-14 MED ORDER — OXYCODONE-ACETAMINOPHEN 5-325 MG PO TABS: 1.0000 | ORAL_TABLET | Freq: Four times a day (QID) | ORAL | 0 refills | Status: DC | PRN

## 2021-07-14 MED ORDER — OXYCODONE-ACETAMINOPHEN 5-325 MG PO TABS
1.0000 | ORAL_TABLET | Freq: Once | ORAL | Status: AC
  Administered 2021-07-14: 1 via ORAL
  Filled 2021-07-14: qty 1

## 2021-07-14 NOTE — Discharge Instructions (Signed)
You have a 40% compression fracture at the 11th thoracic vertebra please take the pain medication as directed and follow-up with your doctor to consider possible kyphoplasty.  Also, call your physician to ask about setting up home health care as well as palliative care ?

## 2021-07-14 NOTE — ED Triage Notes (Signed)
Pt. States she fell  Wednesday morning. Was not seen by anyone, denies any pain that day. Following morning started noticing pain  pain in back lumbar area. States pain 10/10. Took  2 tylenol at 7:30 am. Denies losing consciousness or dizzy.  ?

## 2021-07-14 NOTE — Telephone Encounter (Signed)
Spoke with the patient's daughter and advised that Dr. Hall Busing and his nurse were out today and that they will call her next week in regards to the patient's procedure.  ?The patient is being treated with pain medications for her fracture.  ?

## 2021-07-14 NOTE — ED Provider Notes (Signed)
?[facility] ?Provider Note ? ? ?CSN: [id number] ?Arrival date & time: 07/14/21  1042 ? ?  ? ?History ? ?Chief Complaint  ?Patient presents with  ? Fall  ? ? ?Wendy Velasquez is a 88 y.o. female. ? ?88 year old female presents with mid lower back pain after mechanical fall 2 days ago.  Did not strike her head.  [address] while she was being transferred from the bed to a wheelchair.  Complains of mid low back pain without new weakness in her lower extremities.  No bowel or bladder dysfunction.  Using over-the-counter medications without relief ? ? ?  ? ?Home Medications ?Prior to Admission medications   ?Medication Sig Start Date End Date Taking? Authorizing Provider  ?acetaminophen (TYLENOL) 325 MG tablet Take 650 mg by mouth every 6 (six) hours as needed.   Yes [provider]  ?albuterol (PROVENTIL) (2.5 MG/3ML) 0.083% nebulizer solution Take 3 mLs (2.5 mg total) by nebulization every 6 (six) hours as needed for wheezing or shortness of breath. 12/16/19  Yes Ngetich, Dinah C, NP  ?aspirin 81 MG chewable tablet Chew 81 mg by mouth daily.   Yes [provider]  ?Brinzolamide-Brimonidine (SIMBRINZA) 1-0.2 % SUSP Apply 1 drop to eye at bedtime. ?Patient taking differently: Place 1 drop into both eyes at bedtime. 12/16/19  Yes Ngetich, Nelda Bucks, NP  ?Cholecalciferol (VITAMIN D) 50 MCG (2000 UT) tablet Take 2,000 Units by mouth daily.   Yes [provider]  ?esomeprazole (NEXIUM) 40 MG capsule Take 40 mg by mouth daily at 12 noon.   Yes [provider]  ?fluticasone (FLONASE) 50 MCG/ACT nasal spray Place 2 sprays into both nostrils daily as needed for allergies. SHAKE AS DIRECTED 12/16/19  Yes Ngetich, Dinah C, NP  ?Fluticasone-Salmeterol (ADVAIR) 250-50 MCG/DOSE AEPB Inhale 1 puff into the lungs 2 (two) times daily.   Yes [provider]  ?gabapentin (NEURONTIN) 100 MG capsule Take 1 capsule (100 mg total) by mouth at bedtime. 12/16/19  Yes Ngetich, Dinah C,  NP  ?ibandronate (BONIVA) 150 MG tablet Take 1 tablet (150 mg total) by mouth every 30 (thirty) days. Take in the morning with a full glass of water, on an empty stomach, and do not take anything else by mouth or lie down for the next 30 min. 12/16/19  Yes Ngetich, Nelda Bucks, NP  ?Ipratropium-Albuterol (COMBIVENT RESPIMAT) 20-100 MCG/ACT AERS respimat Inhale 1 puff into the lungs every 8 (eight) hours as needed for wheezing. 12/16/19  Yes Ngetich, Dinah C, NP  ?LANTUS SOLOSTAR 100 UNIT/ML Solostar Pen Inject 8 Units into the skin at bedtime. 05/05/20  Yes [provider]  ?latanoprost (XALATAN) 0.005 % ophthalmic solution Place 1 drop into both eyes at bedtime. 05/05/20  Yes [provider]  ?Menthol, Topical Analgesic, (BIOFREEZE) 4 % GEL Apply 1 application topically daily as needed (pain). Apply to Right Leg.   Yes [provider]  ?metFORMIN (GLUCOPHAGE) 1000 MG tablet Take 1 tablet (1,000 mg total) by mouth 2 (two) times daily with a meal. 12/16/19  Yes Ngetich, Dinah C, NP  ?MYRBETRIQ 25 MG TB24 tablet Take 1 tablet (25 mg total) by mouth daily. 12/16/19  Yes Ngetich, Dinah C, NP  ?sertraline (ZOLOFT) 50 MG tablet Take 1 tablet (50 mg total) by mouth 2 (two) times daily. ?Patient taking differently: Take 50 mg by mouth daily. 12/16/19  Yes Ngetich, Dinah C, NP  ?vitamin B-12 (CYANOCOBALAMIN) 1000 MCG tablet Take 1 tablet (1,000 mcg total) by mouth daily.  12/16/19  Yes Ngetich, Dinah C, NP  ?botulinum toxin Type A (BOTOX) 100 units SOLR injection Inject 100 Units into the muscle every 3 (three) months.    [provider]  ?cetirizine (ZYRTEC) 10 MG tablet Take 10 mg by mouth daily.    [provider]  ?famotidine (PEPCID) 20 MG tablet Take 20 mg by mouth daily as needed for heartburn or indigestion.    [provider]  ?lidocaine-prilocaine (EMLA) cream Apply 1 application topically 4 (four) times daily as needed. ?Patient taking differently: Apply 1 application.  topically 4 (four) times daily as needed (pain). 12/16/19   Ngetich, Nelda Bucks, NP  ?   ? ?Allergies    ?Patient has no known allergies.   ? ?Review of Systems   ?Review of Systems  ?All other systems reviewed and are negative. ? ?Physical Exam ?Updated Vital Signs ?BP (!) 149/75 (BP Location: Right Arm)   Pulse 99   Temp 98.5 ?F (36.9 ?C) (Oral)   Resp 16   Ht 1.6 m ('5\' 3"'$ )   Wt 57.6 kg   SpO2 94%   BMI 22.50 kg/m?  ?Physical Exam ?Vitals and nursing note reviewed.  ?Constitutional:   ?   General: She is not in acute distress. ?   Appearance: Normal appearance. She is well-developed. She is not toxic-appearing.  ?HENT:  ?   Head: Normocephalic and atraumatic.  ?Eyes:  ?   General: Lids are normal.  ?   Conjunctiva/sclera: Conjunctivae normal.  ?   Pupils: Pupils are equal, round, and reactive to light.  ?Neck:  ?   Thyroid: No thyroid mass.  ?   Trachea: No tracheal deviation.  ?Cardiovascular:  ?   Rate and Rhythm: Normal rate and regular rhythm.  ?   Heart sounds: Normal heart sounds. No murmur heard. ?  No gallop.  ?Pulmonary:  ?   Effort: Pulmonary effort is normal. No respiratory distress.  ?   Breath sounds: Normal breath sounds. No stridor. No decreased breath sounds, wheezing, rhonchi or rales.  ?Abdominal:  ?   General: There is no distension.  ?   Palpations: Abdomen is soft.  ?   Tenderness: There is no abdominal tenderness. There is no rebound.  ?Musculoskeletal:     ?   General: No tenderness. Normal range of motion.  ?   Cervical back: Normal range of motion and neck supple.  ?     Back: ? ?Skin: ?   General: Skin is warm and dry.  ?   Findings: No abrasion or rash.  ?Neurological:  ?   Mental Status: She is alert and oriented to person, place, and time. Mental status is at baseline.  ?   GCS: GCS eye subscore is 4. GCS verbal subscore is 5. GCS motor subscore is 6.  ?   Cranial Nerves: No cranial nerve deficit.  ?   Sensory: No sensory deficit.  ?   Motor: Motor function is intact.  ?   Comments:  5 of 5 in bilateral lower extremities  ?Psychiatric:     ?   Attention and Perception: Attention normal.     ?   Speech: Speech normal.     ?   Behavior: Behavior normal.  ? ? ?ED Results / Procedures / Treatments   ?Labs ?(all labs ordered are listed, but only abnormal results are displayed) ?Labs Reviewed - No data to display ? ?EKG ?None ? ?Radiology ?No results found. ? ?Procedures ?Procedures  ? ? ?  Medications Ordered in ED ?Medications - No data to display ? ?ED Course/ Medical Decision Making/ A&P ?  ?                        ?Medical Decision Making ?Amount and/or Complexity of Data Reviewed ?Radiology: ordered. ? ?Risk ?Prescription drug management. ? ? ?Patient is lumbar sacral spine as well as thoracic spine reviewed and per my interpretation shows moderate superior endplate compression fracture at T11 with 40% loss of vertebral body height.  Patient medicated for pain with oral Percocet here.  She has no neurological deficits at this time.  Plan will be to place patient on analgesics and refer to her primary care doctor.  This was discussed with patient and her family.  Also mention the patient consider kyphoplasty if symptoms worsen ? ? ? ? ? ? ? ?Final Clinical Impression(s) / ED Diagnoses ?Final diagnoses:  ?None  ? ? ?Rx / DC Orders ?ED Discharge Orders   ? ? None  ? ?  ? ? ?  ?Lacretia Leigh, MD ?07/14/21 1226 ? ?

## 2021-07-14 NOTE — Telephone Encounter (Signed)
Pt's daughter called and stated that pt had a fall and was in the hospital. As a result of the fall pt suffered a 40% Compression Fracture to the 11 th Vertebrae. Daughter would like to know if pt should reschedule procedure that is to be done on 07/20/21. Please advise ?

## 2021-07-17 ENCOUNTER — Ambulatory Visit (INDEPENDENT_AMBULATORY_CARE_PROVIDER_SITE_OTHER): Payer: Medicare Other

## 2021-07-17 DIAGNOSIS — I442 Atrioventricular block, complete: Secondary | ICD-10-CM

## 2021-07-17 NOTE — Telephone Encounter (Signed)
I spoke with pt's Daughter and she states that the pt is doing very well since her fall. She is controlling the pain with OTC Tylenol. She would like to proceed with the procedure.  ?She was advised that it would be ok to take 2 Tylenol the morning of her procedure.  ?

## 2021-07-18 LAB — CUP PACEART REMOTE DEVICE CHECK
Battery Remaining Longevity: 0 mo
Battery Voltage: 2.57 V
Brady Statistic AP VP Percent: 1 %
Brady Statistic AP VS Percent: 0 %
Brady Statistic AS VP Percent: 99 %
Brady Statistic AS VS Percent: 1 %
Brady Statistic RA Percent Paced: 1 %
Brady Statistic RV Percent Paced: 99 %
Date Time Interrogation Session: 20230508020014
Implantable Lead Implant Date: 20140703
Implantable Lead Implant Date: 20140703
Implantable Lead Location: 753859
Implantable Lead Location: 753860
Implantable Pulse Generator Implant Date: 20140703
Lead Channel Impedance Value: 440 Ohm
Lead Channel Impedance Value: 650 Ohm
Lead Channel Pacing Threshold Amplitude: 0.75 V
Lead Channel Pacing Threshold Amplitude: 1.625 V
Lead Channel Pacing Threshold Pulse Width: 0.4 ms
Lead Channel Pacing Threshold Pulse Width: 0.4 ms
Lead Channel Sensing Intrinsic Amplitude: 12 mV
Lead Channel Sensing Intrinsic Amplitude: 2.2 mV
Lead Channel Setting Pacing Amplitude: 1.875
Lead Channel Setting Pacing Amplitude: 2 V
Lead Channel Setting Pacing Pulse Width: 0.4 ms
Lead Channel Setting Sensing Sensitivity: 12.5 mV
Pulse Gen Model: 2240
Pulse Gen Serial Number: 7522008

## 2021-07-18 NOTE — Pre-Procedure Instructions (Addendum)
Instructed patient on the following items: ?Arrival time 1130 on Thursday 5/11 ?Nothing to eat or drink after midnight ?No meds AM of procedure ?Responsible person to drive you home and stay with you for 24 hrs ?Wash with special soap night before and morning of procedure ? ?

## 2021-07-19 ENCOUNTER — Telehealth: Payer: Self-pay

## 2021-07-19 NOTE — Telephone Encounter (Signed)
Attempted to contact patient's [name] to schedule a Palliative Care consult appointment. No answer left a message to return call.  ?

## 2021-07-20 ENCOUNTER — Other Ambulatory Visit: Payer: Self-pay

## 2021-07-20 ENCOUNTER — Ambulatory Visit (HOSPITAL_COMMUNITY)
Admission: RE | Admit: 2021-07-20 | Discharge: 2021-07-20 | Disposition: A | Discharge status: Home / Self Care | Payer: Medicare Other | Attending: Internal Medicine | Admitting: Internal Medicine

## 2021-07-20 ENCOUNTER — Telehealth: Payer: Self-pay | Admitting: Neurology

## 2021-07-20 ENCOUNTER — Telehealth: Payer: Self-pay

## 2021-07-20 ENCOUNTER — Encounter (HOSPITAL_COMMUNITY)
Admission: RE | Disposition: A | Discharge status: Home / Self Care | Payer: Self-pay | Source: Home / Self Care | Attending: Internal Medicine

## 2021-07-20 DIAGNOSIS — Z794 Long term (current) use of insulin: Secondary | ICD-10-CM | POA: Diagnosis not present

## 2021-07-20 DIAGNOSIS — Z7984 Long term (current) use of oral hypoglycemic drugs: Secondary | ICD-10-CM | POA: Insufficient documentation

## 2021-07-20 DIAGNOSIS — I442 Atrioventricular block, complete: Secondary | ICD-10-CM | POA: Insufficient documentation

## 2021-07-20 DIAGNOSIS — I10 Essential (primary) hypertension: Secondary | ICD-10-CM | POA: Diagnosis not present

## 2021-07-20 DIAGNOSIS — E1140 Type 2 diabetes mellitus with diabetic neuropathy, unspecified: Secondary | ICD-10-CM | POA: Insufficient documentation

## 2021-07-20 DIAGNOSIS — Z45010 Encounter for checking and testing of cardiac pacemaker pulse generator [battery]: Secondary | ICD-10-CM | POA: Diagnosis not present

## 2021-07-20 DIAGNOSIS — I495 Sick sinus syndrome: Secondary | ICD-10-CM

## 2021-07-20 HISTORY — PX: PPM GENERATOR CHANGEOUT: EP1233

## 2021-07-20 LAB — GLUCOSE, CAPILLARY: Glucose-Capillary: 198 mg/dL — ABNORMAL HIGH (ref 70–99)

## 2021-07-20 SURGERY — PPM GENERATOR CHANGEOUT

## 2021-07-20 MED ORDER — CHLORHEXIDINE GLUCONATE 4 % EX LIQD
4.0000 "application " | Freq: Once | CUTANEOUS | Status: DC
  Filled 2021-07-20: qty 60

## 2021-07-20 MED ORDER — ONDANSETRON HCL 4 MG/2ML IJ SOLN: 4.0000 mg | Freq: Four times a day (QID) | INTRAMUSCULAR | Status: DC | PRN

## 2021-07-20 MED ORDER — LIDOCAINE HCL (PF) 1 % IJ SOLN
INTRAMUSCULAR | Status: AC
  Filled 2021-07-20: qty 60

## 2021-07-20 MED ORDER — SODIUM CHLORIDE 0.9 % IV SOLN: INTRAVENOUS | Status: DC

## 2021-07-20 MED ORDER — SODIUM CHLORIDE 0.9 % IV SOLN
80.0000 mg | INTRAVENOUS | Status: AC
  Administered 2021-07-20: 80 mg

## 2021-07-20 MED ORDER — CEFAZOLIN SODIUM-DEXTROSE 2-4 GM/100ML-% IV SOLN
2.0000 g | INTRAVENOUS | Status: AC
  Administered 2021-07-20: 2 g via INTRAVENOUS

## 2021-07-20 MED ORDER — SODIUM CHLORIDE 0.9 % IV SOLN
INTRAVENOUS | Status: AC
  Filled 2021-07-20: qty 2

## 2021-07-20 MED ORDER — LIDOCAINE HCL (PF) 1 % IJ SOLN
INTRAMUSCULAR | Status: DC | PRN
Start: 2021-07-20 — End: 2021-07-20
  Administered 2021-07-20: 60 mL

## 2021-07-20 MED ORDER — POVIDONE-IODINE 10 % EX SWAB: 2.0000 "application " | Freq: Once | CUTANEOUS | Status: DC

## 2021-07-20 MED ORDER — ACETAMINOPHEN 325 MG PO TABS
325.0000 mg | ORAL_TABLET | ORAL | Status: DC | PRN
  Administered 2021-07-20: 650 mg via ORAL
  Filled 2021-07-20: qty 2

## 2021-07-20 MED ORDER — CEFAZOLIN SODIUM-DEXTROSE 2-4 GM/100ML-% IV SOLN
INTRAVENOUS | Status: AC
  Filled 2021-07-20: qty 100

## 2021-07-20 SURGICAL SUPPLY — 5 items
CABLE SURGICAL S-101-97-12 (CABLE) ×3 IMPLANT
KIT WRENCH PACEMAKER ASSEM (MISCELLANEOUS) ×1 IMPLANT
PACEMAKER ASSURITY DR-RF (Pacemaker) ×1 IMPLANT
PAD DEFIB RADIO PHYSIO CONN (PAD) ×3 IMPLANT
TRAY PACEMAKER INSERTION (PACKS) ×3 IMPLANT

## 2021-07-20 NOTE — Discharge Instructions (Signed)

## 2021-07-20 NOTE — Telephone Encounter (Signed)
Pt's daughter, Ocie Cornfield would like a call from Botox Coordinator to reschedule pt's appt that cancelled on 07/26/21. Ms. Alroy Dust said this is her 3 third time calling to reschedule appt. ?

## 2021-07-20 NOTE — H&P (Signed)
?  ?Electrophysiology Office Note ?Date: 07/03/2021 ?  ?ID:  Wendy Velasquez, DOB [birth year], MRN [id number] ?  ?PCP: [name], MD ?Primary Cardiologist: None ?Electrophysiologist: Cristopher Peru, MD  ?  ?CC: Pacemaker follow-up ?  ?BOB Wendy Velasquez is a 88 y.o. female seen today for Cristopher Peru, MD for routine electrophysiology followup.  Since last being seen in our clinic the patient reports doing very well.  she denies chest pain, palpitations, dyspnea, PND, orthopnea, nausea, vomiting, dizziness, syncope, edema, weight gain, or early satiety. ?  ?Device History: ?St Jude DDD PPM implanted 2014 for CHB ?  ?    ?Past Medical History:  ?Diagnosis Date  ? Asthma    ? Cardiac pacemaker in situ    ? Complete heart block ([place])    ? COPD (chronic obstructive pulmonary disease) ([place])    ? Diverticulosis of colon    ? GERD (gastroesophageal reflux disease)    ? Glaucoma of both eyes    ? Heart disease    ? History of cardiac arrest    ?  09-11-2012--  symptomatic complete heart block -- hr 20's--  cpr, intubated and temp. pacer until  perm. pacemaker placement  ? History of esophageal dilatation    ?  for stricture 2007  ? History of hiatal hernia    ? History of iron deficiency anemia    ?  hx transfusion's 15 yrs ago , approx 2001  ? Hyperlipidemia    ? Hypertension    ? Neck pain    ? Peripheral neuropathy    ? Tachy-brady syndrome ([place])    ? Type 2 diabetes mellitus ([place])    ? Urgency incontinence    ?  Botox injections for bladder.  ? Wears glasses    ?  ?     ?Past Surgical History:  ?Procedure Laterality Date  ? ABDOMINAL HYSTERECTOMY   1976  approx  ?  w/ unilateral salpingoophorectomy  ? CATARACT EXTRACTION W/ INTRAOCULAR LENS  IMPLANT, BILATERAL   1990  ? COLONOSCOPY WITH ESOPHAGOGASTRODUODENOSCOPY (EGD)   last one 2007  ? INTERSTIM IMPLANT PLACEMENT   2012  ? INTERSTIM IMPLANT REMOVAL N/A 09/21/2014  ?  Procedure: REMOVAL OF INTERSTIM IMPLANT;  Surgeon: Bjorn Loser, MD;  Location: [facility];  Service: Urology;  Laterality: N/A;  ? INTERSTIM IMPLANT REVISION N/A 09/21/2014  ?  Procedure: INTERSTIM STAGE ONE AND TWO;  Surgeon: Bjorn Loser, MD;  Location: [facility];  Service: Urology;  Laterality: N/A;  ? LAPAROTOMY W/ UNILATERAL SALPINGOOPHORECTOMY   1960's  approx  ? PERMANENT PACEMAKER INSERTION N/A 09/11/2012  ?  Procedure: PERMANENT PACEMAKER INSERTION;  Surgeon: Evans Lance, MD;  Location: [facility] CATH LAB;  Service: Cardiovascular;  Laterality: N/A;  St. Jude Dual Chamber  ? [place]  approx  ?  ?  ?      ?Current Outpatient Medications  ?Medication Sig Dispense Refill  ? acetaminophen (TYLENOL) 325 MG tablet Take 650 mg by mouth every 6 (six) hours as needed.      ? albuterol (PROVENTIL) (2.5 MG/3ML) 0.083% nebulizer solution Take 3 mLs (2.5 mg total) by nebulization every 6 (six) hours as needed for wheezing or shortness of breath. 75 mL 0  ? aspirin 81 MG chewable tablet Chew 81 mg by mouth daily.      ? botulinum toxin Type A (BOTOX) 100 units SOLR injection Inject 100 Units into the muscle every 3 (three) months.      ?  Brinzolamide-Brimonidine (SIMBRINZA) 1-0.2 % SUSP Apply 1 drop to eye at bedtime. (Patient taking differently: Place 1 drop into both eyes at bedtime.) 8 mL 0  ? cetirizine (ZYRTEC) 10 MG tablet Take 10 mg by mouth daily.      ? Cholecalciferol (VITAMIN D) 50 MCG (2000 UT) tablet Take 2,000 Units by mouth daily.      ? esomeprazole (NEXIUM) 40 MG capsule Take 40 mg by mouth daily at 12 noon.      ? famotidine (PEPCID) 20 MG tablet Take 20 mg by mouth daily as needed for heartburn or indigestion.      ? fluticasone (FLONASE) 50 MCG/ACT nasal spray Place 2 sprays into both nostrils daily as needed for allergies. SHAKE AS DIRECTED 16 g 0  ? Fluticasone-Salmeterol (ADVAIR) 250-50 MCG/DOSE AEPB Inhale 1 puff into the lungs 2 (two) times daily.      ? gabapentin (NEURONTIN) 100 MG capsule Take 1 capsule (100 mg total) by mouth at  bedtime. 30 capsule 0  ? ibandronate (BONIVA) 150 MG tablet Take 1 tablet (150 mg total) by mouth every 30 (thirty) days. Take in the morning with a full glass of water, on an empty stomach, and do not take anything else by mouth or lie down for the next 30 min. 1 tablet 0  ? Ipratropium-Albuterol (COMBIVENT RESPIMAT) 20-100 MCG/ACT AERS respimat Inhale 1 puff into the lungs every 8 (eight) hours as needed for wheezing. 4 g 0  ? LANTUS SOLOSTAR 100 UNIT/ML Solostar Pen Inject 8 Units into the skin at bedtime.      ? latanoprost (XALATAN) 0.005 % ophthalmic solution Place 1 drop into both eyes at bedtime.      ? lidocaine-prilocaine (EMLA) cream Apply 1 application topically 4 (four) times daily as needed. (Patient taking differently: Apply 1 application. topically 4 (four) times daily as needed (pain).) 30 g 0  ? Menthol, Topical Analgesic, (BIOFREEZE) 4 % GEL Apply 1 application topically daily as needed (pain). Apply to Right Leg.      ? metFORMIN (GLUCOPHAGE) 1000 MG tablet Take 1 tablet (1,000 mg total) by mouth 2 (two) times daily with a meal. 60 tablet 0  ? MYRBETRIQ 25 MG TB24 tablet Take 1 tablet (25 mg total) by mouth daily. 30 tablet 0  ? sertraline (ZOLOFT) 50 MG tablet Take 1 tablet (50 mg total) by mouth 2 (two) times daily. (Patient taking differently: Take 50 mg by mouth daily.) 60 tablet 0  ? vitamin B-12 (CYANOCOBALAMIN) 1000 MCG tablet Take 1 tablet (1,000 mcg total) by mouth daily. 30 tablet 0  ?  ?No current facility-administered medications for this visit.  ?  ?  ?Allergies:   Patient has no known allergies.  ?  ?Social History: ?Social History  ?  ?     ?Socioeconomic History  ? Marital status: Married  ?    Spouse name: Not on file  ? Number of children: 3  ? Years of education: 25  ? Highest education level: High school graduate  ?Occupational History  ? Occupation: Retired  ?Tobacco Use  ? Smoking status: Never  ? Smokeless tobacco: Never  ?Vaping Use  ? Vaping Use: Never used  ?Substance  and Sexual Activity  ? Alcohol use: No  ? Drug use: No  ? Sexual activity: Not Currently  ?    Birth control/protection: None  ?Other Topics Concern  ? Not on file  ?Social History Narrative  ?  Lives with her husband in an independent  living facility.  ?  Right-handed.  ?  1-2 cup caffeine daily.  ?  ?Social Determinants of Health  ?  ?Financial Resource Strain: Not on file  ?Food Insecurity: Not on file  ?Transportation Needs: Not on file  ?Physical Activity: Not on file  ?Stress: Not on file  ?Social Connections: Not on file  ?Intimate Partner Violence: Not on file  ?  ?  ?Family History: ?     ?Family History  ?Problem Relation Age of Onset  ? Other Mother    ?      died during her childbirth  ? Prostatitis Father    ? Heart failure Sister    ? Breast cancer Sister    ? Diabetic kidney disease Sister    ? Melanoma Brother    ?  ?  ?  ?Review of Systems: ?All other systems reviewed and are otherwise negative except as noted above. ?  ?Physical Exam: ?   ?Vitals:  ?  07/03/21 1134  ?BP: 124/60  ?Pulse: 77  ?SpO2: 98%  ?Weight: 127 lb 6.4 oz (57.8 kg)  ?Height: '5\' 4"'$  (1.626 m)  ?  ?  ?GEN- The patient is well appearing, alert and oriented x 3 today.   ?HEENT: normocephalic, atraumatic; sclera clear, conjunctiva pink; hearing intact; oropharynx clear; neck supple  ?Lungs- Clear to ausculation bilaterally, normal work of breathing.  No wheezes, rales, rhonchi ?Heart- Regular rate and rhythm, no murmurs, rubs or gallops  ?GI- soft, non-tender, non-distended, bowel sounds present  ?Extremities- no clubbing or cyanosis. No edema ?MS- no significant deformity or atrophy ?Skin- warm and dry, no rash or lesion; PPM pocket well healed ?Psych- euthymic mood, full affect ?Neuro- strength and sensation are intact ?  ?PPM Interrogation- reviewed in detail today,  See PACEART report ?  ?EKG:  EKG is ordered today. ?Personal review of ekg ordered  today  shows AS-VP at 77 bpm  ?  ?Recent Labs: ?No results found for requested labs  within last 8760 hours.  ?  ?   ?Wt Readings from Last 3 Encounters:  ?07/03/21 127 lb 6.4 oz (57.8 kg)  ?07/20/20 121 lb 8 oz (55.1 kg)  ?06/02/20 120 lb (54.4 kg)  ?  ?  ?Other studies Reviewed: ?Additional stu

## 2021-07-20 NOTE — Telephone Encounter (Signed)
Spoke with patient's daughter [name] and scheduled a in person Palliative Consult for 07/25/21 @ 10 AM.  ? ?Consent obtained; updated Netsmart, Team List and Epic.  ? ?

## 2021-07-21 ENCOUNTER — Encounter (HOSPITAL_COMMUNITY): Payer: Self-pay | Admitting: Internal Medicine

## 2021-07-25 ENCOUNTER — Other Ambulatory Visit: Payer: Medicare Other | Admitting: Family Medicine

## 2021-07-25 VITALS — BP 122/74 | HR 72 | Resp 18

## 2021-07-25 DIAGNOSIS — S22080D Wedge compression fracture of T11-T12 vertebra, subsequent encounter for fracture with routine healing: Secondary | ICD-10-CM

## 2021-07-25 DIAGNOSIS — Z515 Encounter for palliative care: Secondary | ICD-10-CM | POA: Diagnosis not present

## 2021-07-25 DIAGNOSIS — E11649 Type 2 diabetes mellitus with hypoglycemia without coma: Secondary | ICD-10-CM | POA: Diagnosis not present

## 2021-07-25 DIAGNOSIS — Z9181 History of falling: Secondary | ICD-10-CM | POA: Diagnosis not present

## 2021-07-25 NOTE — Progress Notes (Signed)
? ? ?Manufacturing engineer ?Community Palliative Care Consult Note ?Telephone: [phone]  ?Fax: [phone]  ? ?Date of encounter: 07/25/21 ?10:15 am ?PATIENT NAME: Wendy Velasquez ?[address] ?[place] [address]   ?[phone] (home)  ?DOB: Feb 12, 1926 ?MRN: [id number] ?PRIMARY CARE PROVIDER:    ?[name], MD,  ?[address] ?[place] [address] ?[phone] ? ?REFERRING PROVIDER:   ?Tisovec, Fransico Him, MD ?[address] ?[place],  [place] [address] ?[phone] ? ?RESPONSIBLE PARTY:    ?Contact Information   ? ? Name Relation Home Work Mobile  ? [name],[name] Daughter [phone]  [phone]  ? Mitchell,Bunny Daughter [phone]  [phone]  ? Mitchell,Pam Daughter [phone]  [phone]  ? Tessa Lerner   [phone]  ? ?  ? ? ? ?I met face to face with patient, family, and paid caregiver [name] in her independent living facility. Palliative Care was asked to follow this patient by consultation request of  Tisovec, Fransico Him, MD to address advance care planning and complex medical decision making. This is the initial visit.  ? ? ?      ASSESSMENT, SYMPTOM MANAGEMENT AND PLAN / RECOMMENDATIONS:  ?T11 compression fracture with routine healing from fall due to severe osteoporosis ?Poor reaction with increased confusion with narcotics, even low-dose. ?Recommend scheduled Tylenol 1 g 3 times daily for 2 weeks. ?Alternate heat and ice 15 minutes on with 30 minutes with nothing, repeat as needed. ?Use of lumbar roll and knee support. ?May consider use of calcitonin nasal spray for pain relief. ?Continue gabapentin 100 mg nightly, patient does not want to increase due to sedation. ?Patient believed the Lidoderm patches previously were ineffective. ? ?2.  Hypoglycemia with type 2 Diabetes Mellitus on Insulin ?Last noted hemoglobin A1c 6.9% on April 18, 2019. ?Recommend repeat hemoglobin A1c if not done in last 3 months and goal between 6 to 7%. ?Given a.m.  hypoglycemia and poor intake, would recommend changing long-acting insulin to a.m. administration if eating. ?Encourage bedtime snack with protein including possible Glucerna shake, milk or cheese. ?Recommend reducing dose of long acting insulin if HGB A1c < 7% to avoid hypoglycemia with blood sugars AC and HS or prn if symptomatic. ?Encouraged following simple protein with simple carbohydrates.. ? ?3.  At high risk for falls ?Recommend no independent ambulation attempts, use of WC for mobility ?Recommended home PT to advise on possible DME for safe transfers and training of paid caregiver/family on techniques for improved safety.   ?Family indicated that they had previously had PT without benefit. ? ?4.  Palliative Care Encounter ?Last Code status DNR/DNI ?Recommend creation of MOST-discussed with pt and family. ?Discussed role of Hospice when appropriate ? ? ?Follow up Palliative Care Visit: Palliative care will follow up with pt and daughter in 2 weeks by phone to see if interested in Palliative Care continuing to follow. ? ? ? ?This visit was coded based on medical decision making (MDM). ? ?PPS: 40% ? ?HOSPICE ELIGIBILITY/DIAGNOSIS: TBD ? ?Chief Complaint:  ?[facility] received a referral to follow up with patient for chronic disease management with chronic heart disease, DM and falls.  Palliative offers advance directive counselling and defining/refining goals of care. ? ?HISTORY OF PRESENT ILLNESS:  Wendy Velasquez is a 88 y.o. year old female living with her spouse who has dementia in independent living facility with support of family and paid caregiver, [name].  She has had complete heart block and tachy-brady syndrome resolved with permanent pacemaker which recently had  the battery replaced in the last 2 weeks.  She has HTN, COPD, GERD, DM with hypoglycemia, peripheral neuropathy and cervical dystonia (treated with Botox injections Q 3 months).  She also has hx of 2 falls over  the last year with the most recent resulting in a T11 compression fracture due to severe osteoporosis. She has severe protein calorie malnutrition, severe arthritis in the neck, urge incontinence ( for which she is receiving Botox injections in the bladder).  She is physically deconditioned to the point that she no longer ambulates and only stands to transfer and for dressing/toileting. Falls have occurred when she is trying to get to the bathroom without help.  She had a swallow study done last summer that was good. She does require assistance with bathing and dressing, toileting and is incontinent of bowel and bladder. Has some RLS with neuropathy that is severe in hands, feet and legs. Daughter states they have previously had home PT but did not feel like it mad a huge improvement in her function.  Her paid caregiver is there to help for 2 hours in the am to help her dress, bathe, take her meds and get her breakfast then she returns in the late afternoon evening to help her get ready for dinner, insulin and bedtime. She currently rates pain in her back a 6 on a 10 scale but says at it's worst, usually in the am it is a 10/10.  She has been using heat on it, has a lumbar support roll and support for under her knees but typically doesn't walk. She was given Percocet at the hospital right after the fracture and according to daughter it made her very confused and hyper.  Pt states "I don't want to sleep all the time and it makes me sleepy.  The gabapentin makes me sleepy."  Her paid caregiver has been giving her Tylenol for pain when she asks.  Daughter states she has used Lidoderm patches in the past but didn't note much benefit.  Paid caregiver looking for someone to help her give the patient a bath and cover the midday hours.  Daughter indicates that pt's husband (her father) had significant paranoia whenever PT came in to work with the patient and he did not like it.  He is very accepting of her paid caregiver,  [name] and she is treated like one of the family. The pt's son-in-law expressed that they had prior good experience with Hospice and were unfamiliar with Palliative Care but he knew pt's primary had a Education officer, museum in their office if they had other needs for resources. They indicated that they had her advance care planning done and did not need [facility] to help with this.  She gets Botox injections in her neck and bladder Q 3 months to help with spasm. ? ?History obtained from review of EMR, discussion with family, caregiver/or Ms. Test.  ?I reviewed available labs, medications, imaging, studies and related documents from the EMR.  Records reviewed and summarized above.  ? ?ROS ?General: NAD ?EYES: denies vision changes ?ENMT: denies dysphagia ?Cardiovascular: denies chest pain, denies DOE ?Pulmonary: denies cough, denies increased SOB ?Abdomen: endorses good appetite, denies constipation, endorses continence of bowel ?GU: denies dysuria, endorses continence of urine ?MSK:  denies increased weakness, no falls reported ?Skin: denies rashes or wounds ?Neurological: denies pain, denies insomnia ?Psych: Endorses positive mood ?Heme/lymph/immuno: denies bruises, abnormal bleeding ? ?Physical Exam: ?Current and past weights: ?Constitutional: NAD ?General: frail appearing, thin/WNWD/obese  ?EYES: anicteric sclera,  lids intact, no discharge  ?ENMT: intact hearing, oral mucous membranes moist, dentition intact ?CV: S1S2, RRR, no LE edema ?Pulmonary: CTAB, no increased work of breathing, no cough, room air ?Abdomen: normo-active BS + 4 quadrants, soft and non tender, no ascites ?GU: deferred ?MSK: no sarcopenia, moves all extremities, ambulatory ?Skin: warm and dry, no rashes or wounds on visible skin ?Neuro:  no generalized weakness, no cognitive impairment ?Psych: non-anxious affect, A and O x 3 ?Hem/lymph/immuno: no widespread bruising ? ?CURRENT PROBLEM LIST:  ?Patient Active Problem List  ? Diagnosis Date Noted  ?  Community acquired pneumonia 06/02/2020  ? Failure to thrive in adult 06/02/2020  ? Cervicalgia 03/23/2020  ? Urgency incontinence   ? Pressure injury of skin 11/23/2019  ? Hip fracture ([place]) 11/22/2019

## 2021-07-26 ENCOUNTER — Encounter: Payer: Self-pay | Admitting: Family Medicine

## 2021-07-26 ENCOUNTER — Ambulatory Visit: Payer: Medicare Other | Admitting: Neurology

## 2021-07-26 DIAGNOSIS — Z515 Encounter for palliative care: Secondary | ICD-10-CM | POA: Insufficient documentation

## 2021-07-26 DIAGNOSIS — S22000D Wedge compression fracture of unspecified thoracic vertebra, subsequent encounter for fracture with routine healing: Secondary | ICD-10-CM | POA: Insufficient documentation

## 2021-07-26 DIAGNOSIS — Z9181 History of falling: Secondary | ICD-10-CM | POA: Insufficient documentation

## 2021-07-27 ENCOUNTER — Encounter (HOSPITAL_COMMUNITY): Payer: Self-pay | Admitting: Internal Medicine

## 2021-08-02 ENCOUNTER — Ambulatory Visit: Payer: Medicare Other

## 2021-08-02 NOTE — Progress Notes (Signed)
Remote pacemaker transmission.   

## 2021-08-02 NOTE — Addendum Note (Signed)
Addended by: Douglass Rivers D on: 08/02/2021 10:04 AM   Modules accepted: Level of Service

## 2021-08-03 ENCOUNTER — Ambulatory Visit (INDEPENDENT_AMBULATORY_CARE_PROVIDER_SITE_OTHER): Payer: Medicare Other

## 2021-08-03 DIAGNOSIS — I442 Atrioventricular block, complete: Secondary | ICD-10-CM | POA: Diagnosis not present

## 2021-08-03 LAB — CUP PACEART INCLINIC DEVICE CHECK
Battery Remaining Longevity: 106 mo
Battery Voltage: 3.07 V
Brady Statistic RA Percent Paced: 0.36 %
Brady Statistic RV Percent Paced: 99.99 %
Date Time Interrogation Session: 20230525094600
Implantable Lead Implant Date: 20140703
Implantable Lead Implant Date: 20140703
Implantable Lead Location: 753859
Implantable Lead Location: 753860
Implantable Pulse Generator Implant Date: 20230511
Lead Channel Impedance Value: 400 Ohm
Lead Channel Impedance Value: 425 Ohm
Lead Channel Pacing Threshold Amplitude: 0.75 V
Lead Channel Pacing Threshold Amplitude: 0.75 V
Lead Channel Pacing Threshold Amplitude: 1.375 V
Lead Channel Pacing Threshold Pulse Width: 0.5 ms
Lead Channel Pacing Threshold Pulse Width: 0.5 ms
Lead Channel Pacing Threshold Pulse Width: 0.5 ms
Lead Channel Sensing Intrinsic Amplitude: 2.6 mV
Lead Channel Setting Pacing Amplitude: 1.625
Lead Channel Setting Pacing Amplitude: 2 V
Lead Channel Setting Pacing Pulse Width: 0.5 ms
Lead Channel Setting Sensing Sensitivity: 12.5 mV
Pulse Gen Model: 2272
Pulse Gen Serial Number: 8063379

## 2021-08-03 NOTE — Patient Instructions (Addendum)

## 2021-08-03 NOTE — Progress Notes (Signed)
Wound check appointment. Steri-strips removed. Wound without redness or edema. Incision edges approximated, wound well healed. Normal device function. Thresholds, sensing, and impedances consistent with implant measurements. Device programmed at chronic outputs, no new leads implanted. Histogram distribution appropriate for patient and level of activity. AT/AF burden <1%, EGM's appear AT. No high ventricular rates noted.  ROV in 3 months with implanting physician.

## 2021-08-08 ENCOUNTER — Telehealth: Payer: Self-pay | Admitting: Family Medicine

## 2021-08-08 DIAGNOSIS — I129 Hypertensive chronic kidney disease with stage 1 through stage 4 chronic kidney disease, or unspecified chronic kidney disease: Secondary | ICD-10-CM | POA: Diagnosis not present

## 2021-08-08 DIAGNOSIS — R051 Acute cough: Secondary | ICD-10-CM | POA: Diagnosis not present

## 2021-08-08 DIAGNOSIS — N1831 Chronic kidney disease, stage 3a: Secondary | ICD-10-CM | POA: Diagnosis not present

## 2021-08-08 DIAGNOSIS — R5383 Other fatigue: Secondary | ICD-10-CM | POA: Diagnosis not present

## 2021-08-08 DIAGNOSIS — Z1152 Encounter for screening for COVID-19: Secondary | ICD-10-CM | POA: Diagnosis not present

## 2021-08-08 DIAGNOSIS — J4550 Severe persistent asthma, uncomplicated: Secondary | ICD-10-CM | POA: Diagnosis not present

## 2021-08-08 DIAGNOSIS — R062 Wheezing: Secondary | ICD-10-CM | POA: Diagnosis not present

## 2021-08-08 DIAGNOSIS — J449 Chronic obstructive pulmonary disease, unspecified: Secondary | ICD-10-CM | POA: Diagnosis not present

## 2021-08-08 DIAGNOSIS — J189 Pneumonia, unspecified organism: Secondary | ICD-10-CM | POA: Diagnosis not present

## 2021-08-08 IMAGING — DX DG CHEST 1V PORT
1 series · 1 of 1 positions shown · non-contrast
Comparison: April 17, 2019.

CLINICAL DATA: CRP elevated.

EXAM:
PORTABLE CHEST 1 VIEW

[chest]
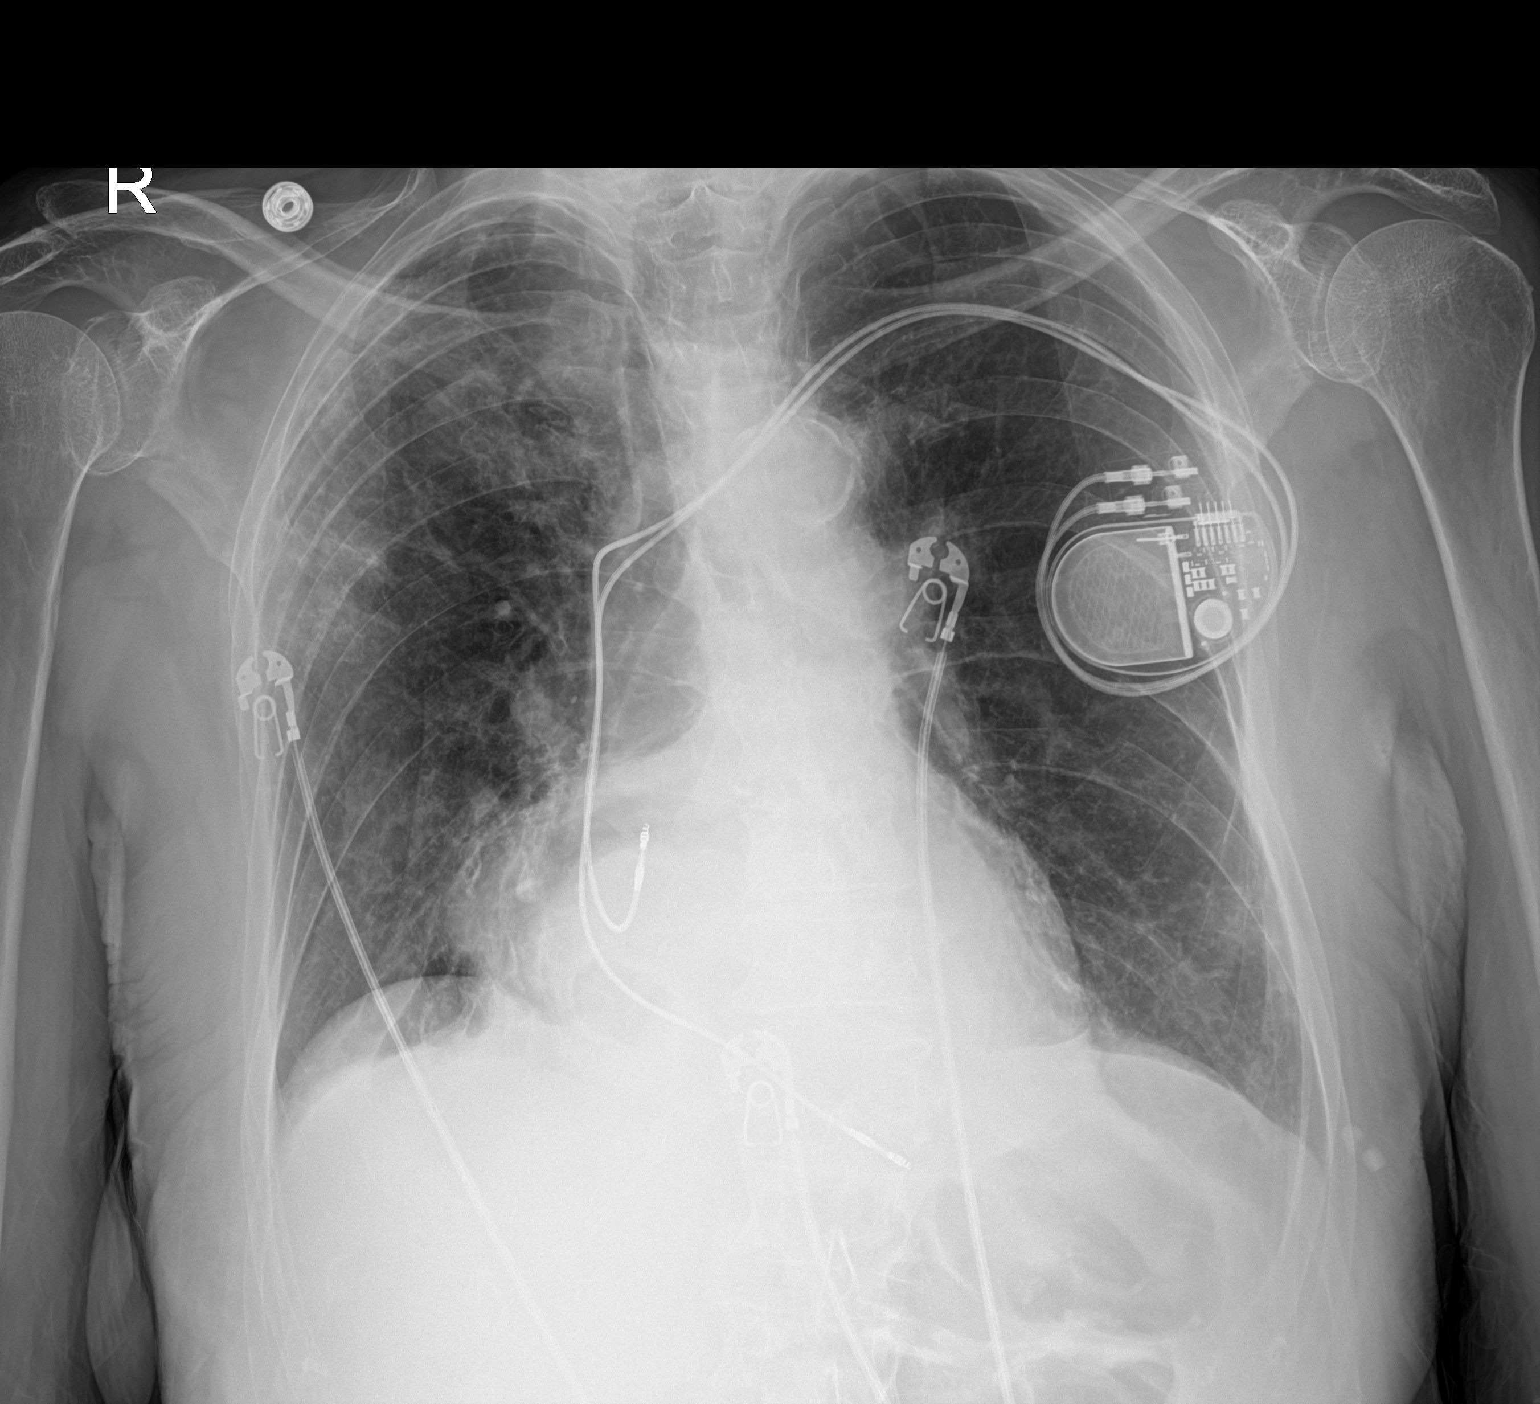

[1 of 1 positions shown; findings below may reference images not displayed]

FINDINGS: Stable cardiomediastinal silhouette. Atherosclerosis of thoracic
aorta is noted. Left-sided pacemaker is unchanged in position. No
pneumothorax is noted. Left lung is clear. Stable right upper lobe
opacity is noted concerning for possible pneumonia. Bony thorax is
unremarkable.
IMPRESSION: Stable right upper lobe opacity is noted concerning for possible
pneumonia. Follow-up radiographs are recommended to ensure
resolution.

Aortic Atherosclerosis ([id number]-OD8.8).

## 2021-08-08 NOTE — Telephone Encounter (Signed)
Follow up with pt's daughter for determination of Palliative Care for admission or not. Spoke with pt's daughter to ask if the family had further discussed and determined whether or not to accept admission for Palliative Care Services.  She states pt has declined further, has pneumonia and is currently unable to even get her pants pulled up after toileting.  She states they are needing more in-home assistance than the 4 hours they are paying for currently.  She states the family is investigating other options.  Advised her that social worker is available to help look for resources as she states prior assisted living "was a disaster" for the patient and her spouse so they don't know what they will do to manage.  Advised that if pt has significant decline with function, not eating or drinking and goal of care is comfort that she may be a candidate for Hospice who will be able to see pt more frequently but would not stay in the home with her.  They could do symptom management and some personal care to alleviate that caregiving burden.  Advised her if they change their minds they can self refer for Hospice evaluation to [facility].  She verbalized appreciation for the call.  Damaris Hippo FNP-C

## 2021-08-17 ENCOUNTER — Ambulatory Visit (INDEPENDENT_AMBULATORY_CARE_PROVIDER_SITE_OTHER): Payer: Medicare Other | Admitting: Neurology

## 2021-08-17 VITALS — BP 102/62 | HR 70

## 2021-08-17 DIAGNOSIS — G243 Spasmodic torticollis: Secondary | ICD-10-CM | POA: Diagnosis not present

## 2021-08-17 DIAGNOSIS — M542 Cervicalgia: Secondary | ICD-10-CM | POA: Diagnosis not present

## 2021-08-17 MED ORDER — ONABOTULINUMTOXINA 100 UNITS IJ SOLR
100.0000 [IU] | Freq: Once | INTRAMUSCULAR | Status: AC
  Administered 2021-08-17: 100 [IU] via INTRAMUSCULAR

## 2021-08-17 NOTE — Progress Notes (Signed)
PATIENT: Wendy Velasquez DOB: 1925-11-08  Chief Complaint  Patient presents with   Procedure    Room 14 Botox 100 units States she is doing well on botox      HISTORICAL  Wendy Velasquez is a 88 years old female, seen in request by her primary care physician Dr. Osborne Casco, Fransico Him for evaluation of neck pain, headaches, initial evaluation was on December 05, 2017.  She is accompanied by her daughter at today's clinical visit.  I have reviewed and summarized the referring note from the referring physician, she has past medical history of diabetes, use insulin, history of cardiac block, status post pacemaker, hyperlipidemia,  She had a history of chronic neck pain, had acute worsening since March 2018, she woke up one morning, noticed upper neck throbbing pain, radiating pain to her skull, has been persistent since then, over the last 1 year, she was seen by orthopedic PA, Ronette Deter, was treated with multiple trigger point injection, with mixture of 1% lidocaine, and Depo-Medrol, most recent injection was on October 10, 2017, on a monthly basis, patient denies significant improvement, also complains of worsening glucose control after injection,  She denies significant radiating pain to bilateral shoulder upper extremity, denies difficulty using arms, she had chronic urinary incontinence is receiving Botox injection by urologist, she contributed to her gait abnormality to her bilateral knee pain, chronic arthritis,  She now complains of difficulty turning her neck, 6 out of 10 constant deep achy pain, multiple tender spots at cervical, upper trapezius, oftentimes she has transient sharp radiating range of motion, difficulty turning her neck,  She also tried physical therapy, is doing morning neck traction regularly,  UPDATE Jan 02 2018: She continue complains of significant neck pain, 8 out of 10 sometimes, radiating pain to bilateral occipital region despite gabapentin 100 mg 5  tablets each day, I have personally reviewed CT cervical spine on December 10, 2017, no acute abnormality, multilevel spondylolisthesis, maximum at C4-5, with associated   facet arthropathy, evidence of ankylosis, mild spinal stenosis at the C2-3, C3-4 level  UPDATE Apr 08 2018: She responded very well to her initial injection January 02, 2018, we used 100 units of Botox, a month after injection she was pain-free for 2 months, there was no significant side effect noticed, in the past 2 weeks, she noticed returning of her neck pain, centered at bilateral mastoid process, nuchal line, tenderness upon deep palpitation.  UPDATE August 12 2018: She responded very well to previous injection  UPDATE Sept 9 2020: She responded well to previous injection, we used Botox a 100 units, is take about 3 days for the benefit to kicking, lasting for 2 to 3 months, no significant side effect.  UPDATE Feb 25 2019: She responded well to previous injection  UPDATE June 01 2019: She suffered COVID-29 January 2020, required hospitalization but no intubation, she complains of increased bilateral upper back pain, neck pain.  Update September 09 2019 She complains of worsening neck pain, she was admitted to hospital in early February for acute hypoxic respiratory failure due to COVID-19 pneumonia, she was treated with remdesivir, did not require intubation, however, since the infection, she complains of declining of her status, could no longer walking, rely on wheelchair, fatigue,  UPDATE Set 29 2021: She is with her daughter at today's clinical visit, suffered pelvic fracture, Botox low dose 100 units continue to help her neck muscle pain, spasm  UPDATE Mar 23 2020: She is accompanied by her  daughter at today's clinical visit, overall doing better, previous Botox injection 100 units has really helped her neck pain, is receiving physical therapy  Update Jul 20, 2020: She responded well to previous injection, recent  hospital for aspiration pneumonia, complains of increased neck pain, essentially wheelchair-bound  Updated January 25, 2021: Previous injection has been helpful, warm compression to her neck also helped her neck pain, she complains of bilateral hands numbness tingling, achy pain, there is significant hands joints deformity and tenderness upon deep palpitation, her complains of bilateral hands pain, paresthesia are likely combination of musculoskeletal etiology, cervical radiculopathy.  Update April 26, 2021: She is accompanied by her daughter at today's clinical visit, previous injection was helpful, no significant side effect noted, she is no longer ambulatory, spent most of the time in her wheelchair  Update July 2023, She lives at an assisted living with her husband who is 40 years old, accompanied by her daughter at today's clinical visit, low-dose Botox a 100 units injection has helped her neck pain, there is no significant side effect   ASSESSMENT AND PLAN  Wendy Velasquez is a 88 y.o. female    Chronic neck pain, headaches Abnormal neck posturing, mild retrocollis, left tilt, significant tenderness at bilateral lateral cervical paraspinal muscles, bilateral levator scapular, tendency to shrug bilateral shoulders  Worsening gait abnormalities, fell, with T11 compression fracture in May 2023  She has significant hyperreflexia on examination,  CT cervical showed evidence of multilevel spondylolisthesis, maximum at C4-5 with associated facet arthropathy, developing ankylosis  Patient and her family do not want pursue further evaluation at this point   EMG guided Botox injection Used 100 units of Botox A    Right longissimus capitis 12.5 units Right splenius cervix 12.5 Right semispinalis 12.5 units Right levator scapular 12.5 units Left splenius capitis 12.5 units   Left semispinalis12.5 units Left splenius cervix 12.5 units Left levator scapular 12.5 units     Marcial Pacas, M.D. Ph.D.  [facility] [address] [place], [place] [address] Ph: [phone] Fax: [phone]  --

## 2021-08-17 NOTE — Progress Notes (Signed)
Botox- 100 units x 1vial Lot: [id number] Expiration: 02/2024 NDC: 1292-9090-30 B/B

## 2021-08-23 DIAGNOSIS — M25562 Pain in left knee: Secondary | ICD-10-CM | POA: Diagnosis not present

## 2021-08-23 DIAGNOSIS — M25561 Pain in right knee: Secondary | ICD-10-CM | POA: Diagnosis not present

## 2021-08-23 DIAGNOSIS — M1711 Unilateral primary osteoarthritis, right knee: Secondary | ICD-10-CM | POA: Diagnosis not present

## 2021-08-23 DIAGNOSIS — M170 Bilateral primary osteoarthritis of knee: Secondary | ICD-10-CM | POA: Diagnosis not present

## 2021-08-23 DIAGNOSIS — M1712 Unilateral primary osteoarthritis, left knee: Secondary | ICD-10-CM | POA: Diagnosis not present

## 2021-09-13 DIAGNOSIS — R350 Frequency of micturition: Secondary | ICD-10-CM | POA: Diagnosis not present

## 2021-09-22 DIAGNOSIS — N3941 Urge incontinence: Secondary | ICD-10-CM | POA: Diagnosis not present

## 2021-09-29 DIAGNOSIS — H5212 Myopia, left eye: Secondary | ICD-10-CM | POA: Diagnosis not present

## 2021-09-29 DIAGNOSIS — H401131 Primary open-angle glaucoma, bilateral, mild stage: Secondary | ICD-10-CM | POA: Diagnosis not present

## 2021-09-29 DIAGNOSIS — H524 Presbyopia: Secondary | ICD-10-CM | POA: Diagnosis not present

## 2021-09-29 DIAGNOSIS — H4010X1 Unspecified open-angle glaucoma, mild stage: Secondary | ICD-10-CM | POA: Diagnosis not present

## 2021-09-29 DIAGNOSIS — H52223 Regular astigmatism, bilateral: Secondary | ICD-10-CM | POA: Diagnosis not present

## 2021-10-02 ENCOUNTER — Telehealth: Payer: Self-pay

## 2021-10-02 NOTE — Telephone Encounter (Signed)
Device alert for AF ongoing from 7/21 @ 23:23, controlled rates Burden 3.6%, ASA only Route to triage LA  Successful telephone encounter to patient's daughter Ocie Cornfield to assess for s/s of new onset AF. Daughter states patient has not been feeling well as of late however longer than onset of AF. Patient is not on [facility], wheelchair bound, lives at home with caregivers round the clock. Discussed with daughter need for AF clinic consult to discuss pros/cons of [place]. V rates controlled with V pacing. Routing to AF clinic for appointment.

## 2021-10-11 ENCOUNTER — Encounter (HOSPITAL_COMMUNITY): Payer: Self-pay | Admitting: Physician Assistant

## 2021-10-11 ENCOUNTER — Ambulatory Visit (HOSPITAL_COMMUNITY)
Admission: RE | Admit: 2021-10-11 | Discharge: 2021-10-11 | Disposition: A | Discharge status: Home / Self Care | Payer: Medicare Other | Source: Ambulatory Visit | Attending: Physician Assistant | Admitting: Physician Assistant

## 2021-10-11 VITALS — BP 138/74 | HR 77 | Ht 63.0 in | Wt 117.8 lb

## 2021-10-11 DIAGNOSIS — Z950 Presence of cardiac pacemaker: Secondary | ICD-10-CM | POA: Diagnosis not present

## 2021-10-11 DIAGNOSIS — I4891 Unspecified atrial fibrillation: Secondary | ICD-10-CM

## 2021-10-11 DIAGNOSIS — I480 Paroxysmal atrial fibrillation: Secondary | ICD-10-CM | POA: Insufficient documentation

## 2021-10-11 DIAGNOSIS — E119 Type 2 diabetes mellitus without complications: Secondary | ICD-10-CM | POA: Diagnosis not present

## 2021-10-11 DIAGNOSIS — D6869 Other thrombophilia: Secondary | ICD-10-CM | POA: Diagnosis not present

## 2021-10-11 DIAGNOSIS — J449 Chronic obstructive pulmonary disease, unspecified: Secondary | ICD-10-CM | POA: Diagnosis not present

## 2021-10-11 DIAGNOSIS — I10 Essential (primary) hypertension: Secondary | ICD-10-CM | POA: Insufficient documentation

## 2021-10-11 DIAGNOSIS — E785 Hyperlipidemia, unspecified: Secondary | ICD-10-CM | POA: Diagnosis not present

## 2021-10-11 MED ORDER — APIXABAN 2.5 MG PO TABS: 2.5000 mg | ORAL_TABLET | Freq: Two times a day (BID) | ORAL | 3 refills | Status: DC

## 2021-10-11 NOTE — Progress Notes (Signed)
Primary Care Physician: [name], MD Primary Electrophysiologist: Dr Lovena Le Referring Physician: Dr [facility]   Wendy Velasquez is a 88 y.o. female with a history of CHB s/p PPM, COPD, HLD, HTN, DM, atrial fibrillation who presents for follow up in the [facility].  The patient was initially diagnosed with atrial fibrillation 09/29/21 when the device clinic received an alert for an ongoing episode. Patient's daughter reported that the patient had not been feeling well but this predated the onset of her afib. Patient has a CHADS2VASC score of 5.  On follow up today, patient has converted back to SR. She states that she was diagnosed with a UTI around the time of her afib and was being treated with antibiotics. She denies any alcohol use.   Today, she denies symptoms of palpitations, chest pain, shortness of breath, orthopnea, PND, lower extremity edema, dizziness, presyncope, syncope, snoring, daytime somnolence, bleeding, or neurologic sequela. The patient is tolerating medications without difficulties and is otherwise without complaint today.    Atrial Fibrillation Risk Factors:  she does not have symptoms or diagnosis of sleep apnea. she does not have a history of rheumatic fever. she does not have a history of alcohol use. The patient does not have a history of early familial atrial fibrillation or other arrhythmias.  she has a BMI of Body mass index is 20.87 kg/m.Marland Kitchen Filed Weights   10/11/21 0948  Weight: 53.4 kg    Family History  Problem Relation Age of Onset   Other Mother        died during her childbirth   Prostatitis Father    Heart failure Sister    Breast cancer Sister    Diabetic kidney disease Sister    Melanoma Brother      Atrial Fibrillation Management history:  Previous antiarrhythmic drugs: none Previous cardioversions: none Previous ablations: none CHADS2VASC score: 5 Anticoagulation history:  none   Past Medical History:  Diagnosis Date   Acute hypoxemic respiratory failure due to COVID-19 ([place]) 04/23/2019   Acute respiratory failure - resolved, extubated 11PM 7/3 09/11/2012   Asthma    Cardiac arrest - due to complete Heart Block, resolved 09/11/2012   Cardiac pacemaker in situ    Cardiogenic shock - resolved 09/11/2012   Community acquired pneumonia 06/02/2020   Complete heart block (HCC)    COPD (chronic obstructive pulmonary disease) (HCC)    COPD exacerbation (HCC)    Diverticulosis of colon    GERD (gastroesophageal reflux disease)    Glaucoma of both eyes    Heart disease    History of cardiac arrest    09-11-2012--  symptomatic complete heart block -- hr 20's--  cpr, intubated and temp. pacer until  perm. pacemaker placement   History of esophageal dilatation    for stricture 2007   History of hiatal hernia    History of iron deficiency anemia    hx transfusion's 15 yrs ago , approx 2001   Hyperlipidemia    Hypertension    Neck pain    Peripheral neuropathy    Pressure injury of skin 11/23/2019   Sepsis ([place])    Tachy-brady syndrome (HCC)    Type 2 diabetes mellitus ([place])    Urgency incontinence    Botox injections for bladder.   Wears glasses    Past Surgical History:  Procedure Laterality Date   ABDOMINAL HYSTERECTOMY  1976  approx   w/ unilateral salpingoophorectomy   CATARACT EXTRACTION W/ INTRAOCULAR LENS  IMPLANT, BILATERAL  1990   COLONOSCOPY WITH ESOPHAGOGASTRODUODENOSCOPY (EGD)  last one 2007   INTERSTIM IMPLANT PLACEMENT  2012   INTERSTIM IMPLANT REMOVAL N/A 09/21/2014   Procedure: REMOVAL OF INTERSTIM IMPLANT;  Surgeon: Bjorn Loser, MD;  Location: [facility];  Service: Urology;  Laterality: N/A;   INTERSTIM IMPLANT REVISION N/A 09/21/2014   Procedure: Barrie Lyme STAGE ONE AND TWO;  Surgeon: Bjorn Loser, MD;  Location: [facility];  Service: Urology;  Laterality: N/A;   LAPAROTOMY W/ UNILATERAL  SALPINGOOPHORECTOMY  1960's  approx   PERMANENT PACEMAKER INSERTION N/A 09/11/2012   Procedure: PERMANENT PACEMAKER INSERTION;  Surgeon: Evans Lance, MD;  Location: [facility] CATH LAB;  Service: Cardiovascular;  Laterality: N/A;  St. Jude Dual Chamber   PPM GENERATOR CHANGEOUT N/A 07/20/2021   Procedure: PPM GENERATOR CHANGEOUT;  Surgeon: Evans Lance, MD;  Location: [facility] CV LAB;  Service: Cardiovascular;  Laterality: N/A;   [place]  approx    Current Outpatient Medications  Medication Sig Dispense Refill   acetaminophen (TYLENOL) 325 MG tablet Take 650 mg by mouth every 6 (six) hours as needed.     albuterol (PROVENTIL) (2.5 MG/3ML) 0.083% nebulizer solution Take 3 mLs (2.5 mg total) by nebulization every 6 (six) hours as needed for wheezing or shortness of breath. 75 mL 0   aspirin 81 MG chewable tablet Chew 81 mg by mouth daily.     botulinum toxin Type A (BOTOX) 100 units SOLR injection Inject 100 Units into the muscle every 3 (three) months.     Brinzolamide-Brimonidine (SIMBRINZA) 1-0.2 % SUSP Apply 1 drop to eye at bedtime. 8 mL 0   cetirizine (ZYRTEC) 10 MG tablet Take 10 mg by mouth daily.     Cholecalciferol (VITAMIN D) 50 MCG (2000 UT) tablet Take 2,000 Units by mouth daily.     esomeprazole (NEXIUM) 40 MG capsule Take 40 mg by mouth daily at 12 noon.     famotidine (PEPCID) 20 MG tablet Take 20 mg by mouth daily as needed for heartburn or indigestion.     fluticasone (FLONASE) 50 MCG/ACT nasal spray Place 2 sprays into both nostrils daily as needed for allergies. SHAKE AS DIRECTED 16 g 0   Fluticasone-Salmeterol (ADVAIR) 250-50 MCG/DOSE AEPB Inhale 1 puff into the lungs 2 (two) times daily.     furosemide (LASIX) 20 MG tablet Take 20 mg by mouth 2 (two) times daily.     gabapentin (NEURONTIN) 100 MG capsule Take 1 capsule (100 mg total) by mouth at bedtime. 30 capsule 0   ibandronate (BONIVA) 150 MG tablet Take 1 tablet (150 mg total) by mouth every 30 (thirty)  days. Take in the morning with a full glass of water, on an empty stomach, and do not take anything else by mouth or lie down for the next 30 min. 1 tablet 0   Ipratropium-Albuterol (COMBIVENT RESPIMAT) 20-100 MCG/ACT AERS respimat Inhale 1 puff into the lungs every 8 (eight) hours as needed for wheezing. 4 g 0   LANTUS SOLOSTAR 100 UNIT/ML Solostar Pen Inject 8 Units into the skin at bedtime.     latanoprost (XALATAN) 0.005 % ophthalmic solution Place 1 drop into both eyes at bedtime.     lidocaine-prilocaine (EMLA) cream Apply 1 application topically 4 (four) times daily as needed. 30 g 0   Menthol, Topical Analgesic, (BIOFREEZE) 4 % GEL Apply 1 application topically daily as needed (pain). Apply to Right Leg.     metFORMIN (  GLUCOPHAGE) 1000 MG tablet Take 1 tablet (1,000 mg total) by mouth 2 (two) times daily with a meal. 60 tablet 0   MYRBETRIQ 25 MG TB24 tablet Take 1 tablet (25 mg total) by mouth daily. 30 tablet 0   oxyCODONE-acetaminophen (PERCOCET/ROXICET) 5-325 MG tablet Take 1 tablet by mouth every 6 (six) hours as needed for severe pain. 15 tablet 0   sertraline (ZOLOFT) 50 MG tablet Take 1 tablet (50 mg total) by mouth 2 (two) times daily. 60 tablet 0   trimethoprim (TRIMPEX) 100 MG tablet Take 100 mg by mouth daily.     vitamin B-12 (CYANOCOBALAMIN) 1000 MCG tablet Take 1 tablet (1,000 mcg total) by mouth daily. 30 tablet 0   No current facility-administered medications for this encounter.    Allergies  Allergen Reactions   Percocet [Oxycodone-Acetaminophen] Other (See Comments)    Hallucinations    Social History   Socioeconomic History   Marital status: Married    Spouse name: Not on file   Number of children: 3   Years of education: 12   Highest education level: High school graduate  Occupational History   Occupation: Retired  Tobacco Use   Smoking status: Never   Smokeless tobacco: Never   Tobacco comments:    Never smoke 10/11/21  Vaping Use   Vaping Use:  Never used  Substance and Sexual Activity   Alcohol use: No   Drug use: No   Sexual activity: Not Currently    Birth control/protection: None  Other Topics Concern   Not on file  Social History Narrative   Lives with her husband in an independent living facility.   Right-handed.   1-2 cup caffeine daily.   Social Determinants of Health   Financial Resource Strain: Not on file  Food Insecurity: Not on file  Transportation Needs: Not on file  Physical Activity: Not on file  Stress: Not on file  Social Connections: Not on file  Intimate Partner Violence: Not on file     ROS- All systems are reviewed and negative except as per the HPI above.  Physical Exam: Vitals:   10/11/21 0948  BP: 138/74  Pulse: 77  Weight: 53.4 kg  Height: '5\' 3"'$  (1.6 m)    GEN- The patient is a well appearing elderly female, alert and oriented x 3 today.   Head- normocephalic, atraumatic Eyes-  Sclera clear, conjunctiva pink Ears- hearing intact Oropharynx- clear Neck- supple  Lungs- Clear to ausculation bilaterally, normal work of breathing Heart- Regular rate and rhythm, no murmurs, rubs or gallops  GI- soft, NT, ND, + BS Extremities- no clubbing, cyanosis, or edema MS- no significant deformity or atrophy Skin- no rash or lesion Psych- euthymic mood, full affect Neuro- strength and sensation are intact  Wt Readings from Last 3 Encounters:  10/11/21 53.4 kg  07/20/21 57.6 kg  07/14/21 57.6 kg    EKG today demonstrates  A sense V paced rhythm Vent. rate 77 BPM PR interval 176 ms QRS duration 176 ms QT/QTcB 458/518 ms  Epic records are reviewed at length today  CHA2DS2-VASc Score = 5  The patient's score is based upon: CHF History: 0 HTN History: 1 Diabetes History: 1 Stroke History: 0 Vascular Disease History: 0 Age Score: 2 Gender Score: 1       ASSESSMENT AND PLAN: 1. Paroxysmal Atrial Fibrillation (ICD10:  I48.0) The patient's CHA2DS2-VASc score is 5, indicating a  7.2% annual risk of stroke.   General education about afib provided and questions  answered. We also discussed her stroke risk and the risks and benefits of anticoagulation at great length. After discussing with patient and family, they agreed to start Eliquis 2.5 mg BID and stop ASA. Bleeding precautions given including what to do if she falls, sees blood in stool etc. Daughter and patient voice understanding.  Patient has converted back to SR.  2. Secondary Hypercoagulable State (ICD10:  D68.69) The patient is at significant risk for stroke/thromboembolism based upon her CHA2DS2-VASc Score of 5.  Start Apixaban (Eliquis).   3. CHB S/p PPM, followed by Dr Lovena Le and the device clinic.  4. HTN Stable, no changes today.   Follow up with Dr Lovena Le as scheduled.    [facility] [address] [place], [place] [address] [phone] 10/11/2021 9:56 AM

## 2021-10-11 NOTE — Patient Instructions (Signed)
Stop aspirin ? ?Start Eliquis 2.5mg twice a day ?

## 2021-10-20 ENCOUNTER — Ambulatory Visit (INDEPENDENT_AMBULATORY_CARE_PROVIDER_SITE_OTHER): Payer: Medicare Other

## 2021-10-20 DIAGNOSIS — F3341 Major depressive disorder, recurrent, in partial remission: Secondary | ICD-10-CM | POA: Diagnosis not present

## 2021-10-20 DIAGNOSIS — I442 Atrioventricular block, complete: Secondary | ICD-10-CM | POA: Diagnosis not present

## 2021-10-20 DIAGNOSIS — J4550 Severe persistent asthma, uncomplicated: Secondary | ICD-10-CM | POA: Diagnosis not present

## 2021-10-20 DIAGNOSIS — I129 Hypertensive chronic kidney disease with stage 1 through stage 4 chronic kidney disease, or unspecified chronic kidney disease: Secondary | ICD-10-CM | POA: Diagnosis not present

## 2021-10-20 DIAGNOSIS — E1129 Type 2 diabetes mellitus with other diabetic kidney complication: Secondary | ICD-10-CM | POA: Diagnosis not present

## 2021-10-20 DIAGNOSIS — M810 Age-related osteoporosis without current pathological fracture: Secondary | ICD-10-CM | POA: Diagnosis not present

## 2021-10-20 DIAGNOSIS — R9389 Abnormal findings on diagnostic imaging of other specified body structures: Secondary | ICD-10-CM | POA: Diagnosis not present

## 2021-10-20 DIAGNOSIS — D692 Other nonthrombocytopenic purpura: Secondary | ICD-10-CM | POA: Diagnosis not present

## 2021-10-20 DIAGNOSIS — E559 Vitamin D deficiency, unspecified: Secondary | ICD-10-CM | POA: Diagnosis not present

## 2021-10-20 DIAGNOSIS — M4302 Spondylolysis, cervical region: Secondary | ICD-10-CM | POA: Diagnosis not present

## 2021-10-20 DIAGNOSIS — E7800 Pure hypercholesterolemia, unspecified: Secondary | ICD-10-CM | POA: Diagnosis not present

## 2021-10-20 DIAGNOSIS — R531 Weakness: Secondary | ICD-10-CM | POA: Diagnosis not present

## 2021-10-20 DIAGNOSIS — N1831 Chronic kidney disease, stage 3a: Secondary | ICD-10-CM | POA: Diagnosis not present

## 2021-10-23 LAB — CUP PACEART REMOTE DEVICE CHECK
Battery Remaining Longevity: 104 mo
Battery Remaining Percentage: 95.5 %
Battery Voltage: 3.02 V
Brady Statistic AP VP Percent: 1 %
Brady Statistic AP VS Percent: 0 %
Brady Statistic AS VP Percent: 99 %
Brady Statistic AS VS Percent: 1 %
Brady Statistic RA Percent Paced: 1 %
Brady Statistic RV Percent Paced: 99 %
Date Time Interrogation Session: 20230811154636
Implantable Lead Implant Date: 20140703
Implantable Lead Implant Date: 20140703
Implantable Lead Location: 753859
Implantable Lead Location: 753860
Implantable Pulse Generator Implant Date: 20230511
Lead Channel Impedance Value: 400 Ohm
Lead Channel Impedance Value: 480 Ohm
Lead Channel Pacing Threshold Amplitude: 0.75 V
Lead Channel Pacing Threshold Amplitude: 1.625 V
Lead Channel Pacing Threshold Pulse Width: 0.5 ms
Lead Channel Pacing Threshold Pulse Width: 0.5 ms
Lead Channel Sensing Intrinsic Amplitude: 2 mV
Lead Channel Setting Pacing Amplitude: 1.875
Lead Channel Setting Pacing Amplitude: 2 V
Lead Channel Setting Pacing Pulse Width: 0.5 ms
Lead Channel Setting Sensing Sensitivity: 12.5 mV
Pulse Gen Model: 2272
Pulse Gen Serial Number: 8063379

## 2021-10-30 ENCOUNTER — Ambulatory Visit (INDEPENDENT_AMBULATORY_CARE_PROVIDER_SITE_OTHER): Payer: Medicare Other | Admitting: Internal Medicine

## 2021-10-30 ENCOUNTER — Encounter: Payer: Self-pay | Admitting: Internal Medicine

## 2021-10-30 VITALS — BP 112/58 | HR 84 | Ht 63.0 in

## 2021-10-30 DIAGNOSIS — Z950 Presence of cardiac pacemaker: Secondary | ICD-10-CM | POA: Diagnosis not present

## 2021-10-30 DIAGNOSIS — I480 Paroxysmal atrial fibrillation: Secondary | ICD-10-CM | POA: Diagnosis not present

## 2021-10-30 DIAGNOSIS — I442 Atrioventricular block, complete: Secondary | ICD-10-CM | POA: Diagnosis not present

## 2021-10-30 NOTE — Patient Instructions (Addendum)
Medication Instructions:  Your physician recommends that you continue on your current medications as directed. Please refer to the Current Medication list given to you today.  *If you need a refill on your cardiac medications before your next appointment, please call your pharmacy*  NO CHANGES TO YOUR MEDICATIONS.  Lab Work: None ordered.  If you have labs (blood work) drawn today and your tests are completely normal, you will receive your results only by: [place] (if you have MyChart) OR A paper copy in the mail If you have any lab test that is abnormal or we need to change your treatment, we will call you to review the results.  Testing/Procedures: None ordered.  Follow-Up:  IN 1 YEAR WITH DR. GREGG TAYLOR, [facility] AT [facility].   Remote monitoring is used to monitor your Pacemaker/ ICD from home. This monitoring reduces the number of office visits required to check your device to one time per year. It allows [place] to keep an eye on the functioning of your device to ensure it is working properly. You are scheduled for a device check from home on 01/19/22. You may send your transmission at any time that day. If you have a wireless device, the transmission will be sent automatically. After your physician reviews your transmission, you will receive a postcard with your next transmission date.  Important Information About Sugar

## 2021-10-30 NOTE — Progress Notes (Signed)
HPI Mrs. Florido returns today for followup. She is a pleasant 88 yo woman with CHB s/p PPM insertion in 2014, then a change out with pocket revision 3 months ago. She has done well in the interim with no chest pain or sob. She is mostly sedentary due to severe arthritis in her knees. She has no chest pain or sob. She did not have trouble with wound healing after her gen change out 3 months ago.  Allergies  Allergen Reactions   Percocet [Oxycodone-Acetaminophen] Other (See Comments)    Hallucinations     Current Outpatient Medications  Medication Sig Dispense Refill   acetaminophen (TYLENOL) 325 MG tablet Take 650 mg by mouth every 6 (six) hours as needed.     albuterol (PROVENTIL) (2.5 MG/3ML) 0.083% nebulizer solution Take 3 mLs (2.5 mg total) by nebulization every 6 (six) hours as needed for wheezing or shortness of breath. 75 mL 0   apixaban (ELIQUIS) 2.5 MG TABS tablet Take 1 tablet (2.5 mg total) by mouth 2 (two) times daily. 60 tablet 3   botulinum toxin Type A (BOTOX) 100 units SOLR injection Inject 100 Units into the muscle every 3 (three) months.     Brinzolamide-Brimonidine (SIMBRINZA) 1-0.2 % SUSP Apply 1 drop to eye at bedtime. 8 mL 0   cetirizine (ZYRTEC) 10 MG tablet Take 10 mg by mouth daily.     Cholecalciferol (VITAMIN D) 50 MCG (2000 UT) tablet Take 2,000 Units by mouth daily.     esomeprazole (NEXIUM) 40 MG capsule Take 40 mg by mouth daily at 12 noon.     famotidine (PEPCID) 20 MG tablet Take 20 mg by mouth daily as needed for heartburn or indigestion.     fluticasone (FLONASE) 50 MCG/ACT nasal spray Place 2 sprays into both nostrils daily as needed for allergies. SHAKE AS DIRECTED 16 g 0   Fluticasone-Salmeterol (ADVAIR) 250-50 MCG/DOSE AEPB Inhale 1 puff into the lungs 2 (two) times daily.     furosemide (LASIX) 20 MG tablet Take 20 mg by mouth 2 (two) times daily.     gabapentin (NEURONTIN) 100 MG capsule Take 1 capsule (100 mg total) by mouth at bedtime. 30  capsule 0   ibandronate (BONIVA) 150 MG tablet Take 1 tablet (150 mg total) by mouth every 30 (thirty) days. Take in the morning with a full glass of water, on an empty stomach, and do not take anything else by mouth or lie down for the next 30 min. 1 tablet 0   Ipratropium-Albuterol (COMBIVENT RESPIMAT) 20-100 MCG/ACT AERS respimat Inhale 1 puff into the lungs every 8 (eight) hours as needed for wheezing. 4 g 0   LANTUS SOLOSTAR 100 UNIT/ML Solostar Pen Inject 8 Units into the skin at bedtime.     latanoprost (XALATAN) 0.005 % ophthalmic solution Place 1 drop into both eyes at bedtime.     Menthol, Topical Analgesic, (BIOFREEZE) 4 % GEL Apply 1 application topically daily as needed (pain). Apply to Right Leg.     metFORMIN (GLUCOPHAGE) 1000 MG tablet Take 1 tablet (1,000 mg total) by mouth 2 (two) times daily with a meal. 60 tablet 0   MYRBETRIQ 25 MG TB24 tablet Take 1 tablet (25 mg total) by mouth daily. 30 tablet 0   sertraline (ZOLOFT) 50 MG tablet Take 1 tablet (50 mg total) by mouth 2 (two) times daily. 60 tablet 0   trimethoprim (TRIMPEX) 100 MG tablet Take 100 mg by mouth daily.  vitamin B-12 (CYANOCOBALAMIN) 1000 MCG tablet Take 1 tablet (1,000 mcg total) by mouth daily. 30 tablet 0   No current facility-administered medications for this visit.     Past Medical History:  Diagnosis Date   Acute hypoxemic respiratory failure due to COVID-19 [facility]) 04/23/2019   Acute respiratory failure - resolved, extubated 11PM 7/3 09/11/2012   Asthma    Cardiac arrest - due to complete Heart Block, resolved 09/11/2012   Cardiac pacemaker in situ    Cardiogenic shock - resolved 09/11/2012   Community acquired pneumonia 06/02/2020   Complete heart block (HCC)    COPD (chronic obstructive pulmonary disease) (HCC)    COPD exacerbation (HCC)    Diverticulosis of colon    GERD (gastroesophageal reflux disease)    Glaucoma of both eyes    Heart disease    History of cardiac arrest    09-11-2012--   symptomatic complete heart block -- hr 20's--  cpr, intubated and temp. pacer until  perm. pacemaker placement   History of esophageal dilatation    for stricture 2007   History of hiatal hernia    History of iron deficiency anemia    hx transfusion's 15 yrs ago , approx 2001   Hyperlipidemia    Hypertension    Neck pain    Peripheral neuropathy    Pressure injury of skin 11/23/2019   Sepsis ([place])    Tachy-brady syndrome (HCC)    Type 2 diabetes mellitus ([place])    Urgency incontinence    Botox injections for bladder.   Wears glasses     ROS:   All systems reviewed and negative except as noted in the HPI.   Past Surgical History:  Procedure Laterality Date   ABDOMINAL HYSTERECTOMY  1976  approx   w/ unilateral salpingoophorectomy   CATARACT EXTRACTION W/ INTRAOCULAR LENS  IMPLANT, BILATERAL  1990   COLONOSCOPY WITH ESOPHAGOGASTRODUODENOSCOPY (EGD)  last one 2007   INTERSTIM IMPLANT PLACEMENT  2012   INTERSTIM IMPLANT REMOVAL N/A 09/21/2014   Procedure: REMOVAL OF INTERSTIM IMPLANT;  Surgeon: Bjorn Loser, MD;  Location: [facility];  Service: Urology;  Laterality: N/A;   INTERSTIM IMPLANT REVISION N/A 09/21/2014   Procedure: Barrie Lyme STAGE ONE AND TWO;  Surgeon: Bjorn Loser, MD;  Location: [facility];  Service: Urology;  Laterality: N/A;   LAPAROTOMY W/ UNILATERAL SALPINGOOPHORECTOMY  1960's  approx   PERMANENT PACEMAKER INSERTION N/A 09/11/2012   Procedure: PERMANENT PACEMAKER INSERTION;  Surgeon: Evans Lance, MD;  Location: [facility] CATH LAB;  Service: Cardiovascular;  Laterality: N/A;  St. Jude Dual Chamber   PPM GENERATOR CHANGEOUT N/A 07/20/2021   Procedure: PPM GENERATOR CHANGEOUT;  Surgeon: Evans Lance, MD;  Location: [place] CV LAB;  Service: Cardiovascular;  Laterality: N/A;   RECTOCELE REPAIR  1991  approx     Family History  Problem Relation Age of Onset   Other Mother        died during her childbirth   Prostatitis  Father    Heart failure Sister    Breast cancer Sister    Diabetic kidney disease Sister    Melanoma Brother      Social History   Socioeconomic History   Marital status: Married    Spouse name: Not on file   Number of children: 3   Years of education: 12   Highest education level: High school graduate  Occupational History   Occupation: Retired  Tobacco Use   Smoking status: Never  Smokeless tobacco: Never   Tobacco comments:    Never smoke 10/11/21  Vaping Use   Vaping Use: Never used  Substance and Sexual Activity   Alcohol use: No   Drug use: No   Sexual activity: Not Currently    Birth control/protection: None  Other Topics Concern   Not on file  Social History Narrative   Lives with her husband in an independent living facility.   Right-handed.   1-2 cup caffeine daily.   Social Determinants of Health   Financial Resource Strain: Not on file  Food Insecurity: Not on file  Transportation Needs: Not on file  Physical Activity: Not on file  Stress: Not on file  Social Connections: Not on file  Intimate Partner Violence: Not on file     BP (!) 112/58   Pulse 84   Ht '5\' 3"'$  (1.6 m)   SpO2 94%   BMI 20.87 kg/m   Physical Exam:  Well appearing elderly woman, NAD HEENT: Unremarkable Neck:  No JVD, no thyromegally Lymphatics:  No adenopathy Back:  No CVA tenderness Lungs:  Clear with no wheezes HEART:  Regular rate rhythm, no murmurs, no rubs, no clicks Abd:  soft, positive bowel sounds, no organomegally, no rebound, no guarding Ext:  2 plus pulses, no edema, no cyanosis, no clubbing Skin:  No rashes no nodules Neuro:  CN II through XII intact, motor grossly intact  DEVICE  Normal device function.  See PaceArt for details.   Assess/Plan:  CHB - she is asymptomatic s/p PPM insertion.  PPM - her St. Jude DDD PM is working normally. HTN - her bp is well controlled. No changes PAF - she is maintaining NSR. No change in her meds. She is  asymptomatic. Continue low dose eliquis.  Wendy Overlie Sreshta Cressler,MD

## 2021-11-03 DIAGNOSIS — J90 Pleural effusion, not elsewhere classified: Secondary | ICD-10-CM | POA: Diagnosis not present

## 2021-11-03 DIAGNOSIS — Z950 Presence of cardiac pacemaker: Secondary | ICD-10-CM | POA: Diagnosis not present

## 2021-11-03 DIAGNOSIS — R051 Acute cough: Secondary | ICD-10-CM | POA: Diagnosis not present

## 2021-11-03 DIAGNOSIS — Z1152 Encounter for screening for COVID-19: Secondary | ICD-10-CM | POA: Diagnosis not present

## 2021-11-03 DIAGNOSIS — I129 Hypertensive chronic kidney disease with stage 1 through stage 4 chronic kidney disease, or unspecified chronic kidney disease: Secondary | ICD-10-CM | POA: Diagnosis not present

## 2021-11-03 DIAGNOSIS — J069 Acute upper respiratory infection, unspecified: Secondary | ICD-10-CM | POA: Diagnosis not present

## 2021-11-03 DIAGNOSIS — J4550 Severe persistent asthma, uncomplicated: Secondary | ICD-10-CM | POA: Diagnosis not present

## 2021-11-03 DIAGNOSIS — N1831 Chronic kidney disease, stage 3a: Secondary | ICD-10-CM | POA: Diagnosis not present

## 2021-11-07 ENCOUNTER — Telehealth: Payer: Self-pay

## 2021-11-07 NOTE — Telephone Encounter (Signed)
I called patient's secondary insurance plan, [place]. I spoke with [place], Barnetta Chapel. [id number] and CPT [phone] is a covered benefit and no precertification is required. Ref # for callis [id number].

## 2021-11-08 DIAGNOSIS — R2689 Other abnormalities of gait and mobility: Secondary | ICD-10-CM | POA: Diagnosis not present

## 2021-11-08 DIAGNOSIS — M6281 Muscle weakness (generalized): Secondary | ICD-10-CM | POA: Diagnosis not present

## 2021-11-09 ENCOUNTER — Ambulatory Visit (INDEPENDENT_AMBULATORY_CARE_PROVIDER_SITE_OTHER): Payer: Medicare Other | Admitting: Neurology

## 2021-11-09 ENCOUNTER — Encounter: Payer: Self-pay | Admitting: Neurology

## 2021-11-09 VITALS — BP 103/64 | HR 70

## 2021-11-09 DIAGNOSIS — G243 Spasmodic torticollis: Secondary | ICD-10-CM

## 2021-11-09 MED ORDER — ONABOTULINUMTOXINA 100 UNITS IJ SOLR
100.0000 [IU] | Freq: Once | INTRAMUSCULAR | Status: AC
  Administered 2021-11-09: 100 [IU] via INTRAMUSCULAR

## 2021-11-09 MED ORDER — ONABOTULINUMTOXINA 100 UNITS IJ SOLR: 100.0000 [IU] | Freq: Once | INTRAMUSCULAR | Status: AC

## 2021-11-09 NOTE — Progress Notes (Signed)
PATIENT: Wendy Velasquez DOB: 08-Jun-1925  Chief Complaint  Patient presents with   Procedure    Room 14 Botox      HISTORICAL  Wendy Velasquez is a 88 years old female, seen in request by her primary care physician Dr. Osborne Casco, Fransico Him for evaluation of neck pain, headaches, initial evaluation was on December 05, 2017.  She is accompanied by her daughter at today's clinical visit.  I have reviewed and summarized the referring note from the referring physician, she has past medical history of diabetes, use insulin, history of cardiac block, status post pacemaker, hyperlipidemia,  She had a history of chronic neck pain, had acute worsening since March 2018, she woke up one morning, noticed upper neck throbbing pain, radiating pain to her skull, has been persistent since then, over the last 1 year, she was seen by orthopedic PA, Ronette Deter, was treated with multiple trigger point injection, with mixture of 1% lidocaine, and Depo-Medrol, most recent injection was on October 10, 2017, on a monthly basis, patient denies significant improvement, also complains of worsening glucose control after injection,  She denies significant radiating pain to bilateral shoulder upper extremity, denies difficulty using arms, she had chronic urinary incontinence is receiving Botox injection by urologist, she contributed to her gait abnormality to her bilateral knee pain, chronic arthritis,  She now complains of difficulty turning her neck, 6 out of 10 constant deep achy pain, multiple tender spots at cervical, upper trapezius, oftentimes she has transient sharp radiating range of motion, difficulty turning her neck,  She also tried physical therapy, is doing morning neck traction regularly,  UPDATE Jan 02 2018: She continue complains of significant neck pain, 8 out of 10 sometimes, radiating pain to bilateral occipital region despite gabapentin 100 mg 5 tablets each day, I have personally reviewed CT  cervical spine on December 10, 2017, no acute abnormality, multilevel spondylolisthesis, maximum at C4-5, with associated   facet arthropathy, evidence of ankylosis, mild spinal stenosis at the C2-3, C3-4 level  UPDATE Apr 08 2018: She responded very well to her initial injection January 02, 2018, we used 100 units of Botox, a month after injection she was pain-free for 2 months, there was no significant side effect noticed, in the past 2 weeks, she noticed returning of her neck pain, centered at bilateral mastoid process, nuchal line, tenderness upon deep palpitation.  UPDATE August 12 2018: She responded very well to previous injection  UPDATE Sept 9 2020: She responded well to previous injection, we used Botox a 100 units, is take about 3 days for the benefit to kicking, lasting for 2 to 3 months, no significant side effect.  UPDATE Feb 25 2019: She responded well to previous injection  UPDATE June 01 2019: She suffered COVID-29 January 2020, required hospitalization but no intubation, she complains of increased bilateral upper back pain, neck pain.  Update September 09 2019 She complains of worsening neck pain, she was admitted to hospital in early February for acute hypoxic respiratory failure due to COVID-19 pneumonia, she was treated with remdesivir, did not require intubation, however, since the infection, she complains of declining of her status, could no longer walking, rely on wheelchair, fatigue,  UPDATE Set 29 2021: She is with her daughter at today's clinical visit, suffered pelvic fracture, Botox low dose 100 units continue to help her neck muscle pain, spasm  UPDATE Mar 23 2020: She is accompanied by her daughter at today's clinical visit, overall doing better, previous  Botox injection 100 units has really helped her neck pain, is receiving physical therapy  Update Jul 20, 2020: She responded well to previous injection, recent hospital for aspiration pneumonia, complains of  increased neck pain, essentially wheelchair-bound  Updated January 25, 2021: Previous injection has been helpful, warm compression to her neck also helped her neck pain, she complains of bilateral hands numbness tingling, achy pain, there is significant hands joints deformity and tenderness upon deep palpitation, her complains of bilateral hands pain, paresthesia are likely combination of musculoskeletal etiology, cervical radiculopathy.  Update April 26, 2021: She is accompanied by her daughter at today's clinical visit, previous injection was helpful, no significant side effect noted, she is no longer ambulatory, spent most of the time in her wheelchair  Update July 2023, She lives at an assisted living with her husband who is 46 years old, accompanied by her daughter at today's clinical visit, low-dose Botox a 100 units injection has helped her neck pain, there is no significant side effect  UPDate November 09, 2021 The Injection does help her neck pain, but she has diffuse body achy pain, atrial fibrillation, increased  lower extremity swelling, sensitivity  ASSESSMENT AND PLAN  Wendy Velasquez is a 88 y.o. female    Chronic neck pain, headaches  Tendency to shrug bilateral shoulder, mild retrocollis,    EMG guided Botox injection Used 100 units of Botox A     Right levator scapular 12.5  units Right splenius capitis 12.5 units Right splenius cervix 12.5 Right upper trapezius 12.5  Left upper trapezius 12.5 Left semispinalis12.5 units Left splenius cervix 12.5 units Left levator scapular 12.5 units     Marcial Pacas, M.D. Ph.D.  [facility] [address], [place], [place] [address] Ph: [phone] Fax: [phone]  --

## 2021-11-09 NOTE — Progress Notes (Signed)
Botox 100 units x 1 vial [id number] [phone] 04/2024 B/B

## 2021-11-10 DIAGNOSIS — R051 Acute cough: Secondary | ICD-10-CM | POA: Diagnosis not present

## 2021-11-10 DIAGNOSIS — J90 Pleural effusion, not elsewhere classified: Secondary | ICD-10-CM | POA: Diagnosis not present

## 2021-11-10 DIAGNOSIS — J4550 Severe persistent asthma, uncomplicated: Secondary | ICD-10-CM | POA: Diagnosis not present

## 2021-11-10 DIAGNOSIS — J069 Acute upper respiratory infection, unspecified: Secondary | ICD-10-CM | POA: Diagnosis not present

## 2021-11-10 DIAGNOSIS — I129 Hypertensive chronic kidney disease with stage 1 through stage 4 chronic kidney disease, or unspecified chronic kidney disease: Secondary | ICD-10-CM | POA: Diagnosis not present

## 2021-11-10 DIAGNOSIS — N1831 Chronic kidney disease, stage 3a: Secondary | ICD-10-CM | POA: Diagnosis not present

## 2021-11-10 DIAGNOSIS — Z950 Presence of cardiac pacemaker: Secondary | ICD-10-CM | POA: Diagnosis not present

## 2021-11-14 NOTE — Progress Notes (Signed)
Remote pacemaker transmission.   

## 2021-11-15 DIAGNOSIS — R2689 Other abnormalities of gait and mobility: Secondary | ICD-10-CM | POA: Diagnosis not present

## 2021-11-15 DIAGNOSIS — M6281 Muscle weakness (generalized): Secondary | ICD-10-CM | POA: Diagnosis not present

## 2021-11-17 ENCOUNTER — Telehealth: Payer: Self-pay | Admitting: Internal Medicine

## 2021-11-17 ENCOUNTER — Ambulatory Visit
Admission: RE | Admit: 2021-11-17 | Discharge: 2021-11-17 | Disposition: A | Discharge status: Home / Self Care | Payer: Medicare Other | Source: Ambulatory Visit | Attending: Registered Nurse | Admitting: Registered Nurse

## 2021-11-17 ENCOUNTER — Other Ambulatory Visit: Payer: Self-pay | Admitting: Registered Nurse

## 2021-11-17 DIAGNOSIS — N1831 Chronic kidney disease, stage 3a: Secondary | ICD-10-CM | POA: Diagnosis not present

## 2021-11-17 DIAGNOSIS — J4550 Severe persistent asthma, uncomplicated: Secondary | ICD-10-CM | POA: Diagnosis not present

## 2021-11-17 DIAGNOSIS — J90 Pleural effusion, not elsewhere classified: Secondary | ICD-10-CM

## 2021-11-17 DIAGNOSIS — D649 Anemia, unspecified: Secondary | ICD-10-CM | POA: Diagnosis not present

## 2021-11-17 DIAGNOSIS — J189 Pneumonia, unspecified organism: Secondary | ICD-10-CM | POA: Diagnosis not present

## 2021-11-17 DIAGNOSIS — E538 Deficiency of other specified B group vitamins: Secondary | ICD-10-CM | POA: Diagnosis not present

## 2021-11-17 DIAGNOSIS — I129 Hypertensive chronic kidney disease with stage 1 through stage 4 chronic kidney disease, or unspecified chronic kidney disease: Secondary | ICD-10-CM | POA: Diagnosis not present

## 2021-11-17 DIAGNOSIS — Z950 Presence of cardiac pacemaker: Secondary | ICD-10-CM | POA: Diagnosis not present

## 2021-11-17 MED ORDER — IOPAMIDOL (ISOVUE-300) INJECTION 61%
75.0000 mL | Freq: Once | INTRAVENOUS | Status: AC | PRN
  Administered 2021-11-17: 75 mL via INTRAVENOUS

## 2021-11-17 NOTE — Telephone Encounter (Signed)
Spoke with Wendy Velasquez at [facility].  Pt was seen by Caprice Beaver, NP who ordered stat CT Chest for persistent pleural effusion and is requesting pt be seen in f/u by Dr Lovena Le.  Appt scheduled for Tuesday 9/12 at 3 pm.

## 2021-11-17 NOTE — Telephone Encounter (Signed)
Caller stated she saw the Caprice Beaver, NP today and they will be sending her for a STAT CT chest.  Caller stated the patient is having persistent pleural perfusion.

## 2021-11-20 ENCOUNTER — Inpatient Hospital Stay (HOSPITAL_COMMUNITY)
Admission: EM | Admit: 2021-11-20 | Discharge: 2021-11-24 | DRG: 291 | Disposition: A | Discharge status: Home Health | Payer: Medicare Other | Attending: Internal Medicine | Admitting: Internal Medicine

## 2021-11-20 ENCOUNTER — Emergency Department (HOSPITAL_COMMUNITY): Payer: Medicare Other

## 2021-11-20 ENCOUNTER — Encounter (HOSPITAL_COMMUNITY): Payer: Self-pay | Admitting: Emergency Medicine

## 2021-11-20 DIAGNOSIS — M542 Cervicalgia: Secondary | ICD-10-CM | POA: Diagnosis present

## 2021-11-20 DIAGNOSIS — M4854XA Collapsed vertebra, not elsewhere classified, thoracic region, initial encounter for fracture: Secondary | ICD-10-CM | POA: Diagnosis present

## 2021-11-20 DIAGNOSIS — E785 Hyperlipidemia, unspecified: Secondary | ICD-10-CM | POA: Diagnosis present

## 2021-11-20 DIAGNOSIS — K219 Gastro-esophageal reflux disease without esophagitis: Secondary | ICD-10-CM | POA: Diagnosis present

## 2021-11-20 DIAGNOSIS — I10 Essential (primary) hypertension: Secondary | ICD-10-CM | POA: Diagnosis not present

## 2021-11-20 DIAGNOSIS — Z66 Do not resuscitate: Secondary | ICD-10-CM | POA: Diagnosis present

## 2021-11-20 DIAGNOSIS — I110 Hypertensive heart disease with heart failure: Secondary | ICD-10-CM | POA: Diagnosis not present

## 2021-11-20 DIAGNOSIS — I509 Heart failure, unspecified: Secondary | ICD-10-CM

## 2021-11-20 DIAGNOSIS — Z993 Dependence on wheelchair: Secondary | ICD-10-CM

## 2021-11-20 DIAGNOSIS — F419 Anxiety disorder, unspecified: Secondary | ICD-10-CM | POA: Diagnosis present

## 2021-11-20 DIAGNOSIS — I5033 Acute on chronic diastolic (congestive) heart failure: Secondary | ICD-10-CM | POA: Diagnosis present

## 2021-11-20 DIAGNOSIS — I959 Hypotension, unspecified: Secondary | ICD-10-CM | POA: Diagnosis not present

## 2021-11-20 DIAGNOSIS — B372 Candidiasis of skin and nail: Secondary | ICD-10-CM | POA: Diagnosis present

## 2021-11-20 DIAGNOSIS — G8929 Other chronic pain: Secondary | ICD-10-CM | POA: Diagnosis present

## 2021-11-20 DIAGNOSIS — J811 Chronic pulmonary edema: Secondary | ICD-10-CM | POA: Diagnosis not present

## 2021-11-20 DIAGNOSIS — J449 Chronic obstructive pulmonary disease, unspecified: Secondary | ICD-10-CM | POA: Diagnosis present

## 2021-11-20 DIAGNOSIS — I248 Other forms of acute ischemic heart disease: Secondary | ICD-10-CM | POA: Diagnosis present

## 2021-11-20 DIAGNOSIS — L89321 Pressure ulcer of left buttock, stage 1: Secondary | ICD-10-CM | POA: Diagnosis present

## 2021-11-20 DIAGNOSIS — E1165 Type 2 diabetes mellitus with hyperglycemia: Secondary | ICD-10-CM | POA: Diagnosis present

## 2021-11-20 DIAGNOSIS — M810 Age-related osteoporosis without current pathological fracture: Secondary | ICD-10-CM | POA: Diagnosis present

## 2021-11-20 DIAGNOSIS — R778 Other specified abnormalities of plasma proteins: Secondary | ICD-10-CM

## 2021-11-20 DIAGNOSIS — L8990 Pressure ulcer of unspecified site, unspecified stage: Secondary | ICD-10-CM | POA: Insufficient documentation

## 2021-11-20 DIAGNOSIS — J9601 Acute respiratory failure with hypoxia: Secondary | ICD-10-CM

## 2021-11-20 DIAGNOSIS — H409 Unspecified glaucoma: Secondary | ICD-10-CM | POA: Diagnosis present

## 2021-11-20 DIAGNOSIS — I495 Sick sinus syndrome: Secondary | ICD-10-CM | POA: Diagnosis present

## 2021-11-20 DIAGNOSIS — J90 Pleural effusion, not elsewhere classified: Secondary | ICD-10-CM | POA: Diagnosis not present

## 2021-11-20 DIAGNOSIS — Z8674 Personal history of sudden cardiac arrest: Secondary | ICD-10-CM

## 2021-11-20 DIAGNOSIS — Z515 Encounter for palliative care: Secondary | ICD-10-CM

## 2021-11-20 DIAGNOSIS — Z794 Long term (current) use of insulin: Secondary | ICD-10-CM | POA: Diagnosis not present

## 2021-11-20 DIAGNOSIS — Z79899 Other long term (current) drug therapy: Secondary | ICD-10-CM | POA: Diagnosis not present

## 2021-11-20 DIAGNOSIS — S22000S Wedge compression fracture of unspecified thoracic vertebra, sequela: Secondary | ICD-10-CM | POA: Diagnosis not present

## 2021-11-20 DIAGNOSIS — Z87440 Personal history of urinary (tract) infections: Secondary | ICD-10-CM | POA: Diagnosis not present

## 2021-11-20 DIAGNOSIS — I442 Atrioventricular block, complete: Secondary | ICD-10-CM | POA: Diagnosis present

## 2021-11-20 DIAGNOSIS — J9811 Atelectasis: Secondary | ICD-10-CM | POA: Diagnosis not present

## 2021-11-20 DIAGNOSIS — Z8616 Personal history of COVID-19: Secondary | ICD-10-CM | POA: Diagnosis not present

## 2021-11-20 DIAGNOSIS — E119 Type 2 diabetes mellitus without complications: Secondary | ICD-10-CM

## 2021-11-20 DIAGNOSIS — I480 Paroxysmal atrial fibrillation: Secondary | ICD-10-CM | POA: Diagnosis present

## 2021-11-20 DIAGNOSIS — E1142 Type 2 diabetes mellitus with diabetic polyneuropathy: Secondary | ICD-10-CM | POA: Diagnosis present

## 2021-11-20 DIAGNOSIS — Z7984 Long term (current) use of oral hypoglycemic drugs: Secondary | ICD-10-CM

## 2021-11-20 DIAGNOSIS — F32A Depression, unspecified: Secondary | ICD-10-CM | POA: Diagnosis present

## 2021-11-20 DIAGNOSIS — R0602 Shortness of breath: Secondary | ICD-10-CM | POA: Diagnosis not present

## 2021-11-20 DIAGNOSIS — Z7901 Long term (current) use of anticoagulants: Secondary | ICD-10-CM

## 2021-11-20 DIAGNOSIS — Z7951 Long term (current) use of inhaled steroids: Secondary | ICD-10-CM

## 2021-11-20 DIAGNOSIS — Z950 Presence of cardiac pacemaker: Secondary | ICD-10-CM

## 2021-11-20 DIAGNOSIS — R531 Weakness: Secondary | ICD-10-CM | POA: Diagnosis not present

## 2021-11-20 DIAGNOSIS — Z20822 Contact with and (suspected) exposure to covid-19: Secondary | ICD-10-CM | POA: Diagnosis present

## 2021-11-20 DIAGNOSIS — Z7401 Bed confinement status: Secondary | ICD-10-CM | POA: Diagnosis not present

## 2021-11-20 DIAGNOSIS — R7989 Other specified abnormal findings of blood chemistry: Secondary | ICD-10-CM

## 2021-11-20 DIAGNOSIS — Z8249 Family history of ischemic heart disease and other diseases of the circulatory system: Secondary | ICD-10-CM

## 2021-11-20 DIAGNOSIS — Z885 Allergy status to narcotic agent status: Secondary | ICD-10-CM

## 2021-11-20 LAB — CBC WITH DIFFERENTIAL/PLATELET
Abs Immature Granulocytes: 0.02 10*3/uL (ref 0.00–0.07)
Basophils Absolute: 0 10*3/uL (ref 0.0–0.1)
Basophils Relative: 1 %
Eosinophils Absolute: 0.2 10*3/uL (ref 0.0–0.5)
Eosinophils Relative: 4 %
HCT: 36.5 % (ref 36.0–46.0)
Hemoglobin: 11.1 g/dL — ABNORMAL LOW (ref 12.0–15.0)
Immature Granulocytes: 0 %
Lymphocytes Relative: 26 %
Lymphs Abs: 1.6 10*3/uL (ref 0.7–4.0)
MCH: 25.3 pg — ABNORMAL LOW (ref 26.0–34.0)
MCHC: 30.4 g/dL (ref 30.0–36.0)
MCV: 83.3 fL (ref 80.0–100.0)
Monocytes Absolute: 0.4 10*3/uL (ref 0.1–1.0)
Monocytes Relative: 7 %
Neutro Abs: 3.8 10*3/uL (ref 1.7–7.7)
Neutrophils Relative %: 62 %
Platelets: 318 10*3/uL (ref 150–400)
RBC: 4.38 MIL/uL (ref 3.87–5.11)
RDW: 15.9 % — ABNORMAL HIGH (ref 11.5–15.5)
WBC: 6.2 10*3/uL (ref 4.0–10.5)
nRBC: 0 % (ref 0.0–0.2)

## 2021-11-20 LAB — COMPREHENSIVE METABOLIC PANEL
ALT: 21 U/L (ref 0–44)
AST: 17 U/L (ref 15–41)
Albumin: 3.7 g/dL (ref 3.5–5.0)
Alkaline Phosphatase: 68 U/L (ref 38–126)
Anion gap: 8 (ref 5–15)
BUN: 30 mg/dL — ABNORMAL HIGH (ref 8–23)
CO2: 33 mmol/L — ABNORMAL HIGH (ref 22–32)
Calcium: 9.6 mg/dL (ref 8.9–10.3)
Chloride: 100 mmol/L (ref 98–111)
Creatinine, Ser: 0.96 mg/dL (ref 0.44–1.00)
GFR, Estimated: 55 mL/min — ABNORMAL LOW (ref >60)
Glucose, Bld: 120 mg/dL — ABNORMAL HIGH (ref 70–99)
Potassium: 4.4 mmol/L (ref 3.5–5.1)
Sodium: 141 mmol/L (ref 135–145)
Total Bilirubin: 0.4 mg/dL (ref 0.3–1.2)
Total Protein: 6.8 g/dL (ref 6.5–8.1)

## 2021-11-20 LAB — URINALYSIS, ROUTINE W REFLEX MICROSCOPIC
Bacteria, UA: NONE SEEN
Bilirubin Urine: NEGATIVE
Glucose, UA: NEGATIVE mg/dL
Hgb urine dipstick: NEGATIVE
Ketones, ur: NEGATIVE mg/dL
Nitrite: NEGATIVE
Protein, ur: NEGATIVE mg/dL
Specific Gravity, Urine: 1.011 (ref 1.005–1.030)
pH: 8 (ref 5.0–8.0)

## 2021-11-20 LAB — BRAIN NATRIURETIC PEPTIDE: B Natriuretic Peptide: 606.7 pg/mL — ABNORMAL HIGH (ref 0.0–100.0)

## 2021-11-20 LAB — PROCALCITONIN: Procalcitonin: <0.1 ng/mL

## 2021-11-20 LAB — MAGNESIUM: Magnesium: 1.4 mg/dL — ABNORMAL LOW (ref 1.7–2.4)

## 2021-11-20 LAB — SARS CORONAVIRUS 2 BY RT PCR: SARS Coronavirus 2 by RT PCR: NEGATIVE

## 2021-11-20 LAB — TROPONIN I (HIGH SENSITIVITY): Troponin I (High Sensitivity): 21 ng/L — ABNORMAL HIGH (ref <18)

## 2021-11-20 MED ORDER — SERTRALINE HCL 25 MG PO TABS
25.0000 mg | ORAL_TABLET | Freq: Every day | ORAL | Status: DC
  Administered 2021-11-21 – 2021-11-24 (×4): 25 mg via ORAL
  Filled 2021-11-20 (×4): qty 1

## 2021-11-20 MED ORDER — MAGNESIUM SULFATE 2 GM/50ML IV SOLN
2.0000 g | Freq: Once | INTRAVENOUS | Status: AC
  Administered 2021-11-20: 2 g via INTRAVENOUS
  Filled 2021-11-20: qty 50

## 2021-11-20 MED ORDER — FUROSEMIDE 10 MG/ML IJ SOLN
40.0000 mg | Freq: Once | INTRAMUSCULAR | Status: AC
  Administered 2021-11-20: 40 mg via INTRAVENOUS
  Filled 2021-11-20: qty 4

## 2021-11-20 MED ORDER — FUROSEMIDE 10 MG/ML IJ SOLN
40.0000 mg | Freq: Every day | INTRAMUSCULAR | Status: DC
  Administered 2021-11-21: 40 mg via INTRAVENOUS
  Filled 2021-11-20: qty 4

## 2021-11-20 MED ORDER — APIXABAN 2.5 MG PO TABS
2.5000 mg | ORAL_TABLET | Freq: Two times a day (BID) | ORAL | Status: DC
  Administered 2021-11-21: 2.5 mg via ORAL
  Filled 2021-11-20: qty 1

## 2021-11-20 MED ORDER — ACETAMINOPHEN 325 MG PO TABS
650.0000 mg | ORAL_TABLET | Freq: Four times a day (QID) | ORAL | Status: DC | PRN
  Administered 2021-11-20 – 2021-11-24 (×2): 650 mg via ORAL
  Filled 2021-11-20 (×2): qty 2

## 2021-11-20 MED ORDER — VITAMIN D 25 MCG (1000 UNIT) PO TABS
2000.0000 [IU] | ORAL_TABLET | Freq: Every day | ORAL | Status: DC
  Administered 2021-11-21 – 2021-11-24 (×4): 2000 [IU] via ORAL
  Filled 2021-11-20 (×4): qty 2

## 2021-11-20 MED ORDER — TRIMETHOPRIM 100 MG PO TABS
100.0000 mg | ORAL_TABLET | Freq: Every day | ORAL | Status: DC
  Administered 2021-11-21 – 2021-11-24 (×4): 100 mg via ORAL
  Filled 2021-11-20 (×4): qty 1

## 2021-11-20 MED ORDER — BRIMONIDINE TARTRATE 0.2 % OP SOLN
1.0000 [drp] | Freq: Every day | OPHTHALMIC | Status: DC
  Administered 2021-11-21 – 2021-11-23 (×3): 1 [drp] via OPHTHALMIC
  Filled 2021-11-20: qty 5

## 2021-11-20 MED ORDER — INSULIN ASPART 100 UNIT/ML IJ SOLN
0.0000 [IU] | Freq: Three times a day (TID) | INTRAMUSCULAR | Status: DC
  Administered 2021-11-21: 1 [IU] via SUBCUTANEOUS
  Administered 2021-11-22 – 2021-11-23 (×3): 2 [IU] via SUBCUTANEOUS
  Administered 2021-11-23 – 2021-11-24 (×2): 3 [IU] via SUBCUTANEOUS
  Filled 2021-11-20: qty 0.09

## 2021-11-20 MED ORDER — LATANOPROST 0.005 % OP SOLN
1.0000 [drp] | Freq: Every day | OPHTHALMIC | Status: DC
  Administered 2021-11-21 – 2021-11-23 (×3): 1 [drp] via OPHTHALMIC
  Filled 2021-11-20: qty 2.5

## 2021-11-20 MED ORDER — ALBUTEROL SULFATE (2.5 MG/3ML) 0.083% IN NEBU: 2.5000 mg | INHALATION_SOLUTION | Freq: Four times a day (QID) | RESPIRATORY_TRACT | Status: DC | PRN

## 2021-11-20 MED ORDER — GABAPENTIN 100 MG PO CAPS
100.0000 mg | ORAL_CAPSULE | Freq: Every day | ORAL | Status: DC
  Administered 2021-11-20 – 2021-11-23 (×4): 100 mg via ORAL
  Filled 2021-11-20 (×4): qty 1

## 2021-11-20 MED ORDER — MOMETASONE FURO-FORMOTEROL FUM 200-5 MCG/ACT IN AERO
2.0000 | INHALATION_SPRAY | Freq: Two times a day (BID) | RESPIRATORY_TRACT | Status: DC
  Administered 2021-11-21 – 2021-11-24 (×6): 2 via RESPIRATORY_TRACT
  Filled 2021-11-20: qty 8.8

## 2021-11-20 MED ORDER — PANTOPRAZOLE SODIUM 40 MG PO TBEC
40.0000 mg | DELAYED_RELEASE_TABLET | Freq: Every day | ORAL | Status: DC
  Administered 2021-11-21 – 2021-11-24 (×4): 40 mg via ORAL
  Filled 2021-11-20 (×4): qty 1

## 2021-11-20 MED ORDER — NYSTATIN 100000 UNIT/GM EX CREA
TOPICAL_CREAM | Freq: Two times a day (BID) | CUTANEOUS | Status: DC
  Filled 2021-11-20: qty 30

## 2021-11-20 MED ORDER — BRINZOLAMIDE 1 % OP SUSP
1.0000 [drp] | Freq: Every day | OPHTHALMIC | Status: DC
  Administered 2021-11-21 – 2021-11-23 (×3): 1 [drp] via OPHTHALMIC
  Filled 2021-11-20: qty 10

## 2021-11-20 MED ORDER — VITAMIN B-12 1000 MCG PO TABS
1000.0000 ug | ORAL_TABLET | Freq: Every day | ORAL | Status: DC
  Administered 2021-11-21 – 2021-11-24 (×4): 1000 ug via ORAL
  Filled 2021-11-20 (×4): qty 1

## 2021-11-20 MED ORDER — MIRABEGRON ER 25 MG PO TB24
25.0000 mg | ORAL_TABLET | Freq: Every day | ORAL | Status: DC
  Administered 2021-11-21 – 2021-11-24 (×4): 25 mg via ORAL
  Filled 2021-11-20 (×4): qty 1

## 2021-11-20 MED ORDER — LORATADINE 10 MG PO TABS
10.0000 mg | ORAL_TABLET | Freq: Every day | ORAL | Status: DC
  Administered 2021-11-21 – 2021-11-24 (×4): 10 mg via ORAL
  Filled 2021-11-20 (×4): qty 1

## 2021-11-20 MED ORDER — LIDOCAINE 5 % EX PTCH
1.0000 | MEDICATED_PATCH | CUTANEOUS | Status: DC
  Administered 2021-11-21 – 2021-11-23 (×3): 1 via TRANSDERMAL
  Filled 2021-11-20 (×3): qty 1

## 2021-11-20 NOTE — ED Notes (Signed)
Received patient in no acute distress breathing spontaneously to room air . Patient medicated as ordered . Continuing care . Patient placed on cardiac monitor.

## 2021-11-20 NOTE — Assessment & Plan Note (Signed)
Mildly elevated at 21. Likely demand for CHF.

## 2021-11-20 NOTE — Assessment & Plan Note (Addendum)
Pt reports being discontinued on Eliquis recently by PCP. Unclear reason. No hx of GI bleed. She did have recently fall resulting in compressive fracture but falls are infrequent and she has home caretaker.  -Her CH2AD2VASCs score now at a 6 with new onset CHF and benefit of anticoagulation remains greater than her risk. Will continue Eliquis.

## 2021-11-20 NOTE — Assessment & Plan Note (Addendum)
Has increase pain. Continue home gabapentin, Tylenol.  -will give Lidocaine patch

## 2021-11-20 NOTE — Assessment & Plan Note (Signed)
History of recurrent UTI.  Continue prophylactic therapy with trimethoprim.

## 2021-11-20 NOTE — H&P (Addendum)
History and Physical    Patient: Wendy Velasquez YBW:[id number] DOB: Jul 23, 1925 DOA: 11/20/2021 DOS: the patient was seen and examined on 11/20/2021 PCP: [name], MD  Patient coming from: Home  Chief Complaint:  Chief Complaint  Patient presents with   Pneumonia   HPI: Wendy Velasquez is a 88 y.o. female with medical history significant of complete heart block s/p pacemaker, paroxysmal atrial fibrillation on Eliquis, recurrent UTI on hypertension, insulin-dependent type 2 diabetes, COPD, chronic neck pain s/p Botox injection who presents with weakness and LE edema.  She has had LE and CXR finding of "fluid" for the past month. Has been on Lasix.  Has continued to feel very weak.  She has chronic COPD and her breathing has actually felt improved.  She normally has to wake up and use her nebulizer but has not the do so in the past week.  Denies any cough but feels like there is a "plug" in her chest.  She had changes to an outpatient chest x-ray last week and was sent for CT chest.  CT chest on 11/17/2021 with no pulmonary embolis but had patchy infiltrate in both lower lung fields suggestive of mutifocal pneumonia mostly in RLL. Small to moderate bilateral pleural effusions worse on the right.  She was started on Levaquin but continued to have worsening symptoms of weakness and fatigue.  In the ED, she was afebrile normotensive on room air. No leukocytosis, hemoglobin 11.1.  No significant electrolyte abnormalities other than hypomagnesemia 1.4.  CBG of 120.  Normal creatinine at 0.96.  Normal LFTs. Troponin mildly elevated 21.  BNP of 606.  EKG with ventricular paced rhythm.  UA was negative.  COVID PCR negative.  Chest x-ray shows bibasilar pleural effusion and atelectasis greater on the right.  She was given IV 40 mg Lasix and magnesium in the ED.  Review of Systems: As mentioned in the history of present illness. All other systems reviewed and are negative. Past Medical  History:  Diagnosis Date   Acute hypoxemic respiratory failure due to COVID-19 [facility]) 04/23/2019   Acute respiratory failure - resolved, extubated 11PM 7/3 09/11/2012   Asthma    Cardiac arrest - due to complete Heart Block, resolved 09/11/2012   Cardiac pacemaker in situ    Cardiogenic shock - resolved 09/11/2012   Community acquired pneumonia 06/02/2020   Complete heart block (HCC)    COPD (chronic obstructive pulmonary disease) (HCC)    COPD exacerbation (HCC)    Diverticulosis of colon    GERD (gastroesophageal reflux disease)    Glaucoma of both eyes    Heart disease    History of cardiac arrest    09-11-2012--  symptomatic complete heart block -- hr 20's--  cpr, intubated and temp. pacer until  perm. pacemaker placement   History of esophageal dilatation    for stricture 2007   History of hiatal hernia    History of iron deficiency anemia    hx transfusion's 15 yrs ago , approx 2001   Hyperlipidemia    Hypertension    Neck pain    Peripheral neuropathy    Pressure injury of skin 11/23/2019   Sepsis ([employer])    Tachy-brady syndrome (HCC)    Type 2 diabetes mellitus ([place])    Urgency incontinence    Botox injections for bladder.   Wears glasses    Past Surgical History:  Procedure Laterality Date   ABDOMINAL HYSTERECTOMY  1976  approx   w/ unilateral salpingoophorectomy  CATARACT EXTRACTION W/ INTRAOCULAR LENS  IMPLANT, BILATERAL  1990   COLONOSCOPY WITH ESOPHAGOGASTRODUODENOSCOPY (EGD)  last one 2007   INTERSTIM IMPLANT PLACEMENT  2012   INTERSTIM IMPLANT REMOVAL N/A 09/21/2014   Procedure: REMOVAL OF INTERSTIM IMPLANT;  Surgeon: Bjorn Loser, MD;  Location: [facility];  Service: Urology;  Laterality: N/A;   INTERSTIM IMPLANT REVISION N/A 09/21/2014   Procedure: Barrie Lyme STAGE ONE AND TWO;  Surgeon: Bjorn Loser, MD;  Location: [facility];  Service: Urology;  Laterality: N/A;   LAPAROTOMY W/ UNILATERAL SALPINGOOPHORECTOMY  1960's  approx    PERMANENT PACEMAKER INSERTION N/A 09/11/2012   Procedure: PERMANENT PACEMAKER INSERTION;  Surgeon: Evans Lance, MD;  Location: [facility] CATH LAB;  Service: Cardiovascular;  Laterality: N/A;  St. Jude Dual Chamber   PPM GENERATOR CHANGEOUT N/A 07/20/2021   Procedure: PPM GENERATOR CHANGEOUT;  Surgeon: Evans Lance, MD;  Location: [facility] CV LAB;  Service: Cardiovascular;  Laterality: N/A;   [place]  approx   Social History:  reports that she has never smoked. She has never used smokeless tobacco. She reports that she does not drink alcohol and does not use drugs.  Allergies  Allergen Reactions   Percocet [Oxycodone-Acetaminophen] Other (See Comments)    Hallucinations    Family History  Problem Relation Age of Onset   Other Mother        died during her childbirth   Prostatitis Father    Heart failure Sister    Breast cancer Sister    Diabetic kidney disease Sister    Melanoma Brother     Prior to Admission medications   Medication Sig Start Date End Date Taking? Authorizing Provider  acetaminophen (TYLENOL) 325 MG tablet Take 650 mg by mouth every 6 (six) hours as needed.    [provider]  albuterol (PROVENTIL) (2.5 MG/3ML) 0.083% nebulizer solution Take 3 mLs (2.5 mg total) by nebulization every 6 (six) hours as needed for wheezing or shortness of breath. 12/16/19   Ngetich, Dinah C, NP  apixaban (ELIQUIS) 2.5 MG TABS tablet Take 1 tablet (2.5 mg total) by mouth 2 (two) times daily. 10/11/21   Fenton, Clint R, PA  botulinum toxin Type A (BOTOX) 100 units SOLR injection Inject 100 Units into the muscle every 3 (three) months.    [provider]  Brinzolamide-Brimonidine (SIMBRINZA) 1-0.2 % SUSP Apply 1 drop to eye at bedtime. 12/16/19   Ngetich, Dinah C, NP  cetirizine (ZYRTEC) 10 MG tablet Take 10 mg by mouth daily.    [provider]  Cholecalciferol (VITAMIN D) 50 MCG (2000 UT) tablet Take 2,000 Units by mouth daily.    [provider]  esomeprazole (NEXIUM) 40 MG capsule Take 40 mg by mouth daily at 12 noon.    [provider]  famotidine (PEPCID) 20 MG tablet Take 20 mg by mouth daily as needed for heartburn or indigestion.    [provider]  fluticasone (FLONASE) 50 MCG/ACT nasal spray Place 2 sprays into both nostrils daily as needed for allergies. SHAKE AS DIRECTED 12/16/19   Ngetich, Dinah C, NP  Fluticasone-Salmeterol (ADVAIR) 250-50 MCG/DOSE AEPB Inhale 1 puff into the lungs 2 (two) times daily.    [provider]  furosemide (LASIX) 20 MG tablet Take 20 mg by mouth 2 (two) times daily. 08/04/21   [provider]  gabapentin (NEURONTIN) 100 MG capsule Take 1 capsule (100 mg total) by mouth at bedtime. 12/16/19   Ngetich,  Dinah C, NP  ibandronate (BONIVA) 150 MG tablet Take 1 tablet (150 mg total) by mouth every 30 (thirty) days. Take in the morning with a full glass of water, on an empty stomach, and do not take anything else by mouth or lie down for the next 30 min. 12/16/19   Ngetich, Dinah C, NP  Ipratropium-Albuterol (COMBIVENT RESPIMAT) 20-100 MCG/ACT AERS respimat Inhale 1 puff into the lungs every 8 (eight) hours as needed for wheezing. 12/16/19   Ngetich, Dinah C, NP  LANTUS SOLOSTAR 100 UNIT/ML Solostar Pen Inject 8 Units into the skin at bedtime. 05/05/20   [provider]  latanoprost (XALATAN) 0.005 % ophthalmic solution Place 1 drop into both eyes at bedtime. 05/05/20   [provider]  levofloxacin (LEVAQUIN) 500 MG tablet Take 500 mg by mouth daily. 11/17/21   [provider]  Menthol, Topical Analgesic, (BIOFREEZE) 4 % GEL Apply 1 application topically daily as needed (pain). Apply to Right Leg.    [provider]  metFORMIN (GLUCOPHAGE) 1000 MG tablet Take 1 tablet (1,000 mg total) by mouth 2 (two) times daily with a meal. 12/16/19   Ngetich, Dinah C, NP  MYRBETRIQ 25 MG TB24 tablet Take 1 tablet (25 mg total) by mouth daily.  12/16/19   Ngetich, Dinah C, NP  potassium chloride (KLOR-CON) 10 MEQ tablet Take 10 mEq by mouth daily as needed. 11/03/21   [provider]  sertraline (ZOLOFT) 25 MG tablet Take 25 mg by mouth daily. 10/31/21   [provider]  sertraline (ZOLOFT) 50 MG tablet Take 1 tablet (50 mg total) by mouth 2 (two) times daily. 12/16/19   Ngetich, Dinah C, NP  trimethoprim (TRIMPEX) 100 MG tablet Take 100 mg by mouth daily. 08/12/21   [provider]  vitamin B-12 (CYANOCOBALAMIN) 1000 MCG tablet Take 1 tablet (1,000 mcg total) by mouth daily. 12/16/19   Ngetich, Nelda Bucks, NP    Physical Exam: Vitals:   11/20/21 1815 11/20/21 1839 11/20/21 1845 11/20/21 2215  BP: 114/65 (!) 110/52 119/62 121/72  Pulse: 70  70 70  Resp: 20  15 (!) 25  Temp:  (!) 97.5 F (36.4 C)    TempSrc:  Oral    SpO2: (!) 89% 90% 93% 95%   Constitutional: NAD, calm, comfortable thin frail chronically ill-appearing female laying in bed at approximately 30 degree incline Eyes:  lids and conjunctivae normal ENMT: Mucous membranes are moist. Neck: normal, supple,  Respiratory: clear to auscultation bilaterally, no wheezing, no crackles. Normal respiratory effort.  Cardiovascular: Regular rate and rhythm, no murmurs / rubs / gallops.  +1 pitting edema bilateral lower distal extremity Abdomen: no tenderness, no masses palpated. . Bowel sounds positive.  Musculoskeletal: no clubbing / cyanosis. No joint deformity upper and lower extremities.Normal muscle tone.  Skin: Moist beefy red erythematous skin around right inguinal fold. Mild erythema around sacral region. Neurologic: CN 2-12 grossly intact.  Strength 5/5 in all 4.  Psychiatric: Normal judgment and insight. Alert and oriented x 3. Normal mood. Data Reviewed:  See HPI   Assessment and Plan: * Acute CHF (congestive heart failure) ([place]) New onset CHF exacerbation.  Patient with month-long increasing lower extremity edema with worsening pleural effusion  seen on chest x-ray.  Elevated BNP 606.  -negative procalcitonin and no symptoms of cough or worsening dyspnea-will d/c levaquin -Given IV 40 mg of Lasix in the ED. Takes '20mg'$  BID at home. Continue IV '40mg'$  Lasix -follow intake and output, daily weights -obtain echocardiogram  Elevated troponin Mildly elevated at 21. Likely demand for CHF.  Hypomagnesemia Replete in ED with IV Magnesium  Compression fracture of thoracic vertebra, sequela Has increase pain. Continue home gabapentin, Tylenol.  -will give Lidocaine patch  Candidiasis of skin Of the right inguinal region.  Nystatin ointment ordered.  Hx of urinary infection History of recurrent UTI.  Continue prophylactic therapy with trimethoprim.  Paroxysmal atrial fibrillation (HCC) Pt reports being discontinued on Eliquis recently by PCP. Unclear reason. No hx of GI bleed. She did have recently fall resulting in compressive fracture but falls are infrequent and she has home caretaker.  -Her CH2AD2VASCs score now at a 6 with new onset CHF and benefit of anticoagulation remains greater than her risk. Will continue Eliquis.   DM2 (diabetes mellitus, type 2) (HCC) Keep on sensitive SSI  COPD (chronic obstructive pulmonary disease) (HCC) Stable, not in acute exacerbation  Essential hypertension Controlled. On Lasix  Complete heart block (HCC) S/p pacemaker -Last change out documented on 07/20/2021      Advance Care Planning:   Code Status: DNR   Consults: none  Family Communication: Discussed with daughter at bedside  Severity of Illness: The appropriate patient status for this patient is INPATIENT. Inpatient status is judged to be reasonable and necessary in order to provide the required intensity of service to ensure the patient's safety. The patient's presenting symptoms, physical exam findings, and initial radiographic and laboratory data in the context of their chronic comorbidities is felt to place them at high risk  for further clinical deterioration. Furthermore, it is not anticipated that the patient will be medically stable for discharge from the hospital within 2 midnights of admission.   * I certify that at the point of admission it is my clinical judgment that the patient will require inpatient hospital care spanning beyond 2 midnights from the point of admission due to high intensity of service, high risk for further deterioration and high frequency of surveillance required.*  Author: Orene Desanctis, DO 11/20/2021 10:59 PM  For on call review [email or web address].

## 2021-11-20 NOTE — Assessment & Plan Note (Addendum)
S/p pacemaker -Last change out documented on 07/20/2021

## 2021-11-20 NOTE — ED Triage Notes (Signed)
Patient BIB daughter. Reports patient has been sick x1 month with frequent visits to PCP. States dx with pneumonia last week and started levaquin x3 days ago. Reports patient is getting progressively worse. States she has decreased appetite, increased weakness, and hallucinations.

## 2021-11-20 NOTE — Assessment & Plan Note (Signed)
Of the right inguinal region.  Nystatin ointment ordered.

## 2021-11-20 NOTE — Assessment & Plan Note (Signed)
Keep on sensitive SSI 

## 2021-11-20 NOTE — Assessment & Plan Note (Signed)
Replete in ED with IV Magnesium

## 2021-11-20 NOTE — ED Provider Notes (Addendum)
Wendy Velasquez DEPT Provider Note  CSN: [id number] Arrival date & time: 11/20/21 1416  Chief Complaint(s) Pneumonia  HPI Wendy Velasquez is a 88 y.o. female with a history of complete heart block status post pacemaker, COPD, hypertension, hyperlipidemia, diabetes presenting to the emergency department with fatigue.  Patient has had progressive fatigue over the past month, worsening in the last few days.  She saw her primary doctor who ordered a CT scan concerning for multifocal pneumonia and was started on Levaquin, however her symptoms have persisted and even worsened.  She denies any cough.  No abdominal pain, chest pain, nausea vomiting, diarrhea, fevers or chills.  She does report some leg swelling.  She reports mild shortness of breath.   Past Medical History Past Medical History:  Diagnosis Date   Acute hypoxemic respiratory failure due to COVID-19 [facility]) 04/23/2019   Acute respiratory failure - resolved, extubated 11PM 7/3 09/11/2012   Asthma    Cardiac arrest - due to complete Heart Block, resolved 09/11/2012   Cardiac pacemaker in situ    Cardiogenic shock - resolved 09/11/2012   Community acquired pneumonia 06/02/2020   Complete heart block (HCC)    COPD (chronic obstructive pulmonary disease) (HCC)    COPD exacerbation (HCC)    Diverticulosis of colon    GERD (gastroesophageal reflux disease)    Glaucoma of both eyes    Heart disease    History of cardiac arrest    09-11-2012--  symptomatic complete heart block -- hr 20's--  cpr, intubated and temp. pacer until  perm. pacemaker placement   History of esophageal dilatation    for stricture 2007   History of hiatal hernia    History of iron deficiency anemia    hx transfusion's 15 yrs ago , approx 2001   Hyperlipidemia    Hypertension    Neck pain    Peripheral neuropathy    Pressure injury of skin 11/23/2019   Sepsis ([place])    Tachy-brady syndrome (HCC)    Type 2 diabetes mellitus ([place])     Urgency incontinence    Botox injections for bladder.   Wears glasses    Patient Active Problem List   Diagnosis Date Noted   Acute CHF (congestive heart failure) ([place]) 11/20/2021   Paroxysmal atrial fibrillation ([place]) 10/30/2021   New onset a-fib ([place]) 10/11/2021   Secondary hypercoagulable state ([place]) 10/11/2021   At high risk for falls 07/26/2021   Compression fracture of thoracic vertebra with routine healing 07/26/2021   Palliative care encounter 07/26/2021   Hypoglycemia associated with type 2 diabetes mellitus ([place]) 07/25/2021   Failure to thrive in adult 06/02/2020   Cervicalgia 03/23/2020   Urgency incontinence    Hip fracture ([place]) 11/22/2019   Neck pain 04/08/2018   Cervical dystonia 01/02/2018   Chronic neck pain 12/05/2017   Type 2 diabetes mellitus with hyperglycemia, with long-term current use of insulin (HCC)    Esophageal reflux    Depression    Physical deconditioning    Severe protein-calorie malnutrition (HCC)    DM2 (diabetes mellitus, type 2) ([place]) 02/25/2015   Anorexia 10/01/2014   S/P cardiac pacemaker procedure 09/13/2012   Complete heart block ([place]) 09/11/2012   Essential hypertension 09/11/2012   Hyperlipidemia - on staint 09/11/2012   COPD (chronic obstructive pulmonary disease) ([place]) 09/11/2012   Osteoporosis 09/11/2012   GERD (gastroesophageal reflux disease) 09/11/2012   Home Medication(s) Prior to Admission medications   Medication Sig Start Date End Date Taking? Authorizing Provider  acetaminophen (TYLENOL) 325 MG tablet Take 650 mg by mouth every 6 (six) hours as needed.    [provider]  albuterol (PROVENTIL) (2.5 MG/3ML) 0.083% nebulizer solution Take 3 mLs (2.5 mg total) by nebulization every 6 (six) hours as needed for wheezing or shortness of breath. 12/16/19   Ngetich, Dinah C, NP  apixaban (ELIQUIS) 2.5 MG TABS tablet Take 1 tablet (2.5 mg total) by mouth 2 (two) times daily. 10/11/21   Fenton, Clint R, PA  botulinum toxin  Type A (BOTOX) 100 units SOLR injection Inject 100 Units into the muscle every 3 (three) months.    [provider]  Brinzolamide-Brimonidine (SIMBRINZA) 1-0.2 % SUSP Apply 1 drop to eye at bedtime. 12/16/19   Ngetich, Dinah C, NP  cetirizine (ZYRTEC) 10 MG tablet Take 10 mg by mouth daily.    [provider]  Cholecalciferol (VITAMIN D) 50 MCG (2000 UT) tablet Take 2,000 Units by mouth daily.    [provider]  esomeprazole (NEXIUM) 40 MG capsule Take 40 mg by mouth daily at 12 noon.    [provider]  famotidine (PEPCID) 20 MG tablet Take 20 mg by mouth daily as needed for heartburn or indigestion.    [provider]  fluticasone (FLONASE) 50 MCG/ACT nasal spray Place 2 sprays into both nostrils daily as needed for allergies. SHAKE AS DIRECTED 12/16/19   Ngetich, Dinah C, NP  Fluticasone-Salmeterol (ADVAIR) 250-50 MCG/DOSE AEPB Inhale 1 puff into the lungs 2 (two) times daily.    [provider]  furosemide (LASIX) 20 MG tablet Take 20 mg by mouth 2 (two) times daily. 08/04/21   [provider]  gabapentin (NEURONTIN) 100 MG capsule Take 1 capsule (100 mg total) by mouth at bedtime. 12/16/19   Ngetich, Dinah C, NP  ibandronate (BONIVA) 150 MG tablet Take 1 tablet (150 mg total) by mouth every 30 (thirty) days. Take in the morning with a full glass of water, on an empty stomach, and do not take anything else by mouth or lie down for the next 30 min. 12/16/19   Ngetich, Dinah C, NP  Ipratropium-Albuterol (COMBIVENT RESPIMAT) 20-100 MCG/ACT AERS respimat Inhale 1 puff into the lungs every 8 (eight) hours as needed for wheezing. 12/16/19   Ngetich, Dinah C, NP  LANTUS SOLOSTAR 100 UNIT/ML Solostar Pen Inject 8 Units into the skin at bedtime. 05/05/20   [provider]  latanoprost (XALATAN) 0.005 % ophthalmic solution Place 1 drop into both eyes at bedtime. 05/05/20   [provider]  Menthol, Topical Analgesic, (BIOFREEZE) 4 %  GEL Apply 1 application topically daily as needed (pain). Apply to Right Leg.    [provider]  metFORMIN (GLUCOPHAGE) 1000 MG tablet Take 1 tablet (1,000 mg total) by mouth 2 (two) times daily with a meal. 12/16/19   Ngetich, Dinah C, NP  MYRBETRIQ 25 MG TB24 tablet Take 1 tablet (25 mg total) by mouth daily. 12/16/19   Ngetich, Dinah C, NP  sertraline (ZOLOFT) 50 MG tablet Take 1 tablet (50 mg total) by mouth 2 (two) times daily. 12/16/19   Ngetich, Dinah C, NP  trimethoprim (TRIMPEX) 100 MG tablet Take 100 mg by mouth daily. 08/12/21   [provider]  vitamin B-12 (CYANOCOBALAMIN) 1000 MCG tablet Take 1 tablet (1,000 mcg total) by mouth daily. 12/16/19   Ngetich, Nelda Bucks, NP  Past Surgical History Past Surgical History:  Procedure Laterality Date   ABDOMINAL HYSTERECTOMY  1976  approx   w/ unilateral salpingoophorectomy   CATARACT EXTRACTION W/ INTRAOCULAR LENS  IMPLANT, BILATERAL  1990   COLONOSCOPY WITH ESOPHAGOGASTRODUODENOSCOPY (EGD)  last one 2007   INTERSTIM IMPLANT PLACEMENT  2012   INTERSTIM IMPLANT REMOVAL N/A 09/21/2014   Procedure: REMOVAL OF INTERSTIM IMPLANT;  Surgeon: Bjorn Loser, MD;  Location[facility];  Service: Urology;  Laterality: N/A;   INTERSTIM IMPLANT REVISION N/A 09/21/2014   Procedure: [place] STAGE ONE AND TWO;  Surgeon: Bjorn Loser, MD;  Location: [facility];  Service: Urology;  Laterality: N/A;   LAPAROTOMY W/ UNILATERAL SALPINGOOPHORECTOMY  1960's  approx   PERMANENT PACEMAKER INSERTION N/A 09/11/2012   Procedure: PERMANENT PACEMAKER INSERTION;  Surgeon: Evans Lance, MD;  Location: [facility] CATH LAB;  Service: Cardiovascular;  Laterality: N/A;  St. Jude Dual Chamber   PPM GENERATOR CHANGEOUT N/A 07/20/2021   Procedure: PPM GENERATOR CHANGEOUT;  Surgeon: Evans Lance, MD;   Location: [place] CV LAB;  Service: Cardiovascular;  Laterality: N/A;   RECTOCELE REPAIR  1991  approx   Family History Family History  Problem Relation Age of Onset   Other Mother        died during her childbirth   Prostatitis Father    Heart failure Sister    Breast cancer Sister    Diabetic kidney disease Sister    Melanoma Brother     Social History Social History   Tobacco Use   Smoking status: Never   Smokeless tobacco: Never   Tobacco comments:    Never smoke 10/11/21  Vaping Use   Vaping Use: Never used  Substance Use Topics   Alcohol use: No   Drug use: No   Allergies Percocet [oxycodone-acetaminophen]  Review of Systems Review of Systems  All other systems reviewed and are negative.   Physical Exam Vital Signs  I have reviewed the triage vital signs BP 119/62   Pulse 70   Temp (!) 97.5 F (36.4 C) (Oral)   Resp 15   SpO2 93%  Physical Exam Vitals and nursing note reviewed.  Constitutional:      General: She is not in acute distress.    Appearance: She is well-developed.  HENT:     Head: Normocephalic and atraumatic.     Mouth/Throat:     Mouth: Mucous membranes are moist.  Eyes:     Pupils: Pupils are equal, round, and reactive to light.  Cardiovascular:     Rate and Rhythm: Normal rate and regular rhythm.     Heart sounds: No murmur heard. Pulmonary:     Effort: Pulmonary effort is normal. No respiratory distress.     Breath sounds: Rales (bibasilarly) present.  Abdominal:     General: Abdomen is flat.     Palpations: Abdomen is soft.     Tenderness: There is no abdominal tenderness.  Musculoskeletal:        General: No tenderness.     Right lower leg: Edema present.     Left lower leg: Edema present.  Skin:    General: Skin is warm and dry.  Neurological:     General: No focal deficit present.     Mental Status: She is alert and oriented to person, place, and time. Mental status is at baseline.  Psychiatric:        Mood and  Affect: Mood normal.  Behavior: Behavior normal.     ED Results and Treatments Labs (all labs ordered are listed, but only abnormal results are displayed) Labs Reviewed  COMPREHENSIVE METABOLIC PANEL - Abnormal; Notable for the following components:      Result Value   CO2 33 (*)    Glucose, Bld 120 (*)    BUN 30 (*)    GFR, Estimated 55 (*)    All other components within normal limits  CBC WITH DIFFERENTIAL/PLATELET - Abnormal; Notable for the following components:   Hemoglobin 11.1 (*)    MCH 25.3 (*)    RDW 15.9 (*)    All other components within normal limits  MAGNESIUM - Abnormal; Notable for the following components:   Magnesium 1.4 (*)    All other components within normal limits  BRAIN NATRIURETIC PEPTIDE - Abnormal; Notable for the following components:   B Natriuretic Peptide 606.7 (*)    All other components within normal limits  TROPONIN I (HIGH SENSITIVITY) - Abnormal; Notable for the following components:   Troponin I (High Sensitivity) 21 (*)    All other components within normal limits  SARS CORONAVIRUS 2 BY RT PCR  PROCALCITONIN  URINALYSIS, ROUTINE W REFLEX MICROSCOPIC  TROPONIN I (HIGH SENSITIVITY)                                                                                                                          Radiology DG Chest 2 View  Result Date: 11/20/2021 CLINICAL DATA:  Diagnosed with pneumonia last week, started Levaquin 3 days ago, progressive worsening of symptoms, shortness of breath, decreased appetite, increased weakness, hallucinations. History asthma, COPD, diabetes mellitus, hypertension, heart disease EXAM: CHEST - 2 VIEW COMPARISON:  06/02/2020 FINDINGS: LEFT subclavian sequential transvenous pacemaker leads project at RIGHT atrium and RIGHT ventricle. Enlargement of cardiac silhouette with pulmonary vascular congestion. Atherosclerotic calcification aorta. Bibasilar effusions and atelectasis greater on RIGHT. No definite  infiltrate or pneumothorax. Osseous demineralization with marked compression fracture of a lower thoracic vertebra, new since prior study. IMPRESSION: Bibasilar pleural effusions and atelectasis greater on RIGHT. Enlargement of cardiac silhouette with pulmonary vascular congestion. Aortic Atherosclerosis (ICD10-I70.0). Osseous demineralization with interval marked compression fracture of a lower thoracic vertebra since 06/02/2020. Electronically Signed   By: Lavonia Dana M.D.   On: 11/20/2021 16:05    Pertinent labs & imaging results that were available during my care of the patient were reviewed by me and considered in my medical decision making (see MDM for details).  Medications Ordered in ED Medications  magnesium sulfate IVPB 2 g 50 mL (0 g Intravenous Stopped 11/20/21 1928)  furosemide (LASIX) injection 40 mg (40 mg Intravenous Given 11/20/21 1918)  Procedures Procedures  (including critical care time)  Medical Decision Making / ED Course   MDM:  88 year old female presenting to the emergency department with generalized weakness.  Patient well-appearing, exam with bilateral crackles and lower extremity edema.  Presentation most concerning for acute CHF.  Patient has no prior echo or history of this.  Less likely pneumonia, recent CT scan does demonstrate possible pneumonia, but patient has been on Levaquin without improvement.  She is afebrile has no leukocytosis.  We will send procalcitonin.  If elevated will likely give IV antibiotics.  Patient hypoxic on room air, does not normally use oxygen, will likely require admission.  No wheezing to suggest COPD exacerbation.  Recent CT scan performed yesterday with no evidence of large PE, no chest pain to suggest PE.  Clinical Course as of 11/20/21 2015  Mon Nov 20, 2021  1915 [place] with the hospitalist Dr. Flossie Buffy  who will admit the patient.  Discussed results with the family members at bedside.  Lower concern for pneumonia with no elevated WBC count.  Procalcitonin pending.  Will give Lasix [WS]    Clinical Course User Index [WS] Cristie Hem, MD     Additional history obtained: -Additional history obtained from family -External records from outside source obtained and reviewed including: Chart review including previous notes, labs, imaging, consultation notes   Lab Tests: -I ordered, reviewed, and interpreted labs.   The pertinent results include:   Labs Reviewed  COMPREHENSIVE METABOLIC PANEL - Abnormal; Notable for the following components:      Result Value   CO2 33 (*)    Glucose, Bld 120 (*)    BUN 30 (*)    GFR, Estimated 55 (*)    All other components within normal limits  CBC WITH DIFFERENTIAL/PLATELET - Abnormal; Notable for the following components:   Hemoglobin 11.1 (*)    MCH 25.3 (*)    RDW 15.9 (*)    All other components within normal limits  MAGNESIUM - Abnormal; Notable for the following components:   Magnesium 1.4 (*)    All other components within normal limits  BRAIN NATRIURETIC PEPTIDE - Abnormal; Notable for the following components:   B Natriuretic Peptide 606.7 (*)    All other components within normal limits  TROPONIN I (HIGH SENSITIVITY) - Abnormal; Notable for the following components:   Troponin I (High Sensitivity) 21 (*)    All other components within normal limits  SARS CORONAVIRUS 2 BY RT PCR  PROCALCITONIN  URINALYSIS, ROUTINE W REFLEX MICROSCOPIC  TROPONIN I (HIGH SENSITIVITY)      EKG   EKG Interpretation  Date/Time:  Monday November 20 2021 15:18:24 EDT Ventricular Rate:  70 PR Interval:    QRS Duration: 172 QT Interval:  493 QTC Calculation: 533 R Axis:   -83 Text Interpretation: Accelerated junctional rhythm Nonspecific IVCD with LAD LVH with secondary repolarization abnormality Confirmed by Garnette Gunner [phone]) on  11/20/2021 4:13:07 PM         Imaging Studies ordered: I ordered imaging studies including CXR On my interpretation imaging demonstrates pulmonary edema I independently visualized and interpreted imaging. I agree with the radiologist interpretation   Medicines ordered and prescription drug management: Meds ordered this encounter  Medications   magnesium sulfate IVPB 2 g 50 mL   furosemide (LASIX) injection 40 mg    -I have reviewed the patients home medicines and have made adjustments as needed   Consultations Obtained: I requested consultation with the hospitalist,  and discussed  lab and imaging findings as well as pertinent plan - they recommend: admission   Cardiac Monitoring: The patient was maintained on a cardiac monitor.  I personally viewed and interpreted the cardiac monitored which showed an underlying rhythm of: NSR  Social Determinants of Health:  Factors impacting patients care include: lives in independent living   Reevaluation: After the interventions noted above, I reevaluated the patient and found that they have improved  Co morbidities that complicate the patient evaluation  Past Medical History:  Diagnosis Date   Acute hypoxemic respiratory failure due to COVID-19 ([place]) 04/23/2019   Acute respiratory failure - resolved, extubated 11PM 7/3 09/11/2012   Asthma    Cardiac arrest - due to complete Heart Block, resolved 09/11/2012   Cardiac pacemaker in situ    Cardiogenic shock - resolved 09/11/2012   Community acquired pneumonia 06/02/2020   Complete heart block (HCC)    COPD (chronic obstructive pulmonary disease) ([place])    COPD exacerbation ([place])    Diverticulosis of colon    GERD (gastroesophageal reflux disease)    Glaucoma of both eyes    Heart disease    History of cardiac arrest    09-11-2012--  symptomatic complete heart block -- hr 20's--  cpr, intubated and temp. pacer until  perm. pacemaker placement   History of esophageal dilatation    for  stricture 2007   History of hiatal hernia    History of iron deficiency anemia    hx transfusion's 15 yrs ago , approx 2001   Hyperlipidemia    Hypertension    Neck pain    Peripheral neuropathy    Pressure injury of skin 11/23/2019   Sepsis ([place])    Tachy-brady syndrome (HCC)    Type 2 diabetes mellitus ([place])    Urgency incontinence    Botox injections for bladder.   Wears glasses       Dispostion: Admit    Final Clinical Impression(s) / ED Diagnoses Final diagnoses:  Acute congestive heart failure, unspecified heart failure type ([place])  Acute respiratory failure with hypoxia ([place])     This chart was dictated using voice recognition software.  Despite best efforts to proofread,  errors can occur which can change the documentation meaning.    Cristie Hem, MD 11/20/21 1918    Cristie Hem, MD 11/20/21 2015

## 2021-11-20 NOTE — Assessment & Plan Note (Signed)
Controlled. On Lasix

## 2021-11-20 NOTE — Assessment & Plan Note (Signed)
Stable, not in acute exacerbation

## 2021-11-20 NOTE — Assessment & Plan Note (Addendum)
New onset CHF exacerbation.  Patient with month-long increasing lower extremity edema with worsening pleural effusion seen on chest x-ray.  Elevated BNP 606.  -negative procalcitonin and no symptoms of cough or worsening dyspnea-will d/c levaquin -Given IV 40 mg of Lasix in the ED. Takes '20mg'$  BID at home. Continue IV '40mg'$  Lasix -follow intake and output, daily weights -obtain echocardiogram

## 2021-11-21 ENCOUNTER — Encounter: Payer: Medicare Other | Admitting: Internal Medicine

## 2021-11-21 ENCOUNTER — Other Ambulatory Visit: Payer: Self-pay

## 2021-11-21 ENCOUNTER — Inpatient Hospital Stay (HOSPITAL_COMMUNITY): Payer: Medicare Other

## 2021-11-21 DIAGNOSIS — I509 Heart failure, unspecified: Secondary | ICD-10-CM

## 2021-11-21 LAB — BASIC METABOLIC PANEL
Anion gap: 8 (ref 5–15)
BUN: 29 mg/dL — ABNORMAL HIGH (ref 8–23)
CO2: 33 mmol/L — ABNORMAL HIGH (ref 22–32)
Calcium: 9.1 mg/dL (ref 8.9–10.3)
Chloride: 99 mmol/L (ref 98–111)
Creatinine, Ser: 1.07 mg/dL — ABNORMAL HIGH (ref 0.44–1.00)
GFR, Estimated: 48 mL/min — ABNORMAL LOW (ref >60)
Glucose, Bld: 137 mg/dL — ABNORMAL HIGH (ref 70–99)
Potassium: 3.9 mmol/L (ref 3.5–5.1)
Sodium: 140 mmol/L (ref 135–145)

## 2021-11-21 LAB — CBC
HCT: 32.3 % — ABNORMAL LOW (ref 36.0–46.0)
Hemoglobin: 9.9 g/dL — ABNORMAL LOW (ref 12.0–15.0)
MCH: 25.8 pg — ABNORMAL LOW (ref 26.0–34.0)
MCHC: 30.7 g/dL (ref 30.0–36.0)
MCV: 84.1 fL (ref 80.0–100.0)
Platelets: 269 10*3/uL (ref 150–400)
RBC: 3.84 MIL/uL — ABNORMAL LOW (ref 3.87–5.11)
RDW: 16 % — ABNORMAL HIGH (ref 11.5–15.5)
WBC: 6.1 10*3/uL (ref 4.0–10.5)
nRBC: 0 % (ref 0.0–0.2)

## 2021-11-21 LAB — GLUCOSE, CAPILLARY
Glucose-Capillary: 140 mg/dL — ABNORMAL HIGH (ref 70–99)
Glucose-Capillary: 107 mg/dL — ABNORMAL HIGH (ref 70–99)
Glucose-Capillary: 94 mg/dL (ref 70–99)
Glucose-Capillary: 132 mg/dL — ABNORMAL HIGH (ref 70–99)

## 2021-11-21 LAB — ECHOCARDIOGRAM COMPLETE
AR max vel: 2.06 cm2
AV Area VTI: 2.27 cm2
AV Area mean vel: 2.01 cm2
AV Mean grad: 2 mmHg
AV Peak grad: 4.5 mmHg
Ao pk vel: 1.06 m/s
Height: 62 in
S' Lateral: 2.9 cm
Weight: 1954.16 oz

## 2021-11-21 LAB — HEMOGLOBIN A1C
Hgb A1c MFr Bld: 7.9 % — ABNORMAL HIGH (ref 4.8–5.6)
Mean Plasma Glucose: 180.03 mg/dL

## 2021-11-21 LAB — TROPONIN I (HIGH SENSITIVITY): Troponin I (High Sensitivity): 24 ng/L — ABNORMAL HIGH (ref <18)

## 2021-11-21 MED ORDER — GERHARDT'S BUTT CREAM
TOPICAL_CREAM | Freq: Three times a day (TID) | CUTANEOUS | Status: DC
  Filled 2021-11-21: qty 1

## 2021-11-21 MED ORDER — ZINC OXIDE 40 % EX OINT
TOPICAL_OINTMENT | Freq: Four times a day (QID) | CUTANEOUS | Status: DC | PRN
  Filled 2021-11-21: qty 57

## 2021-11-21 MED ORDER — FUROSEMIDE 20 MG PO TABS
20.0000 mg | ORAL_TABLET | Freq: Two times a day (BID) | ORAL | Status: DC
  Administered 2021-11-21 – 2021-11-22 (×2): 20 mg via ORAL
  Filled 2021-11-21 (×3): qty 1

## 2021-11-21 NOTE — Progress Notes (Signed)
PROGRESS NOTE  Wendy Velasquez  ZOX:[id number] DOB: December 04, 1925 DOA: 11/20/2021 PCP: [name], MD   Brief Narrative:  Patient is a 55 female with history of complete heart block status post pacemaker, paroxysmal A-fib on Eliquis, recurrent UTI, hypertension, insulin-dependent diabetes type 2, COPD, chronic neck pain status post motor injection who presented from home with complaints of weakness, botox extremity swelling.  Patient takes Lasix at home.  She recently underwent CT chest on 9/8 which did not show any PE but showed patchy infiltrates in both lower lung fields/to multifocal pneumonia.  Patient was started on Levaquin as an outpatient but continued to have worsening of symptoms with fatigue and weakness.  On presentation,  she was normotensive, afebrile.  Lab work did not show any leukocytosis, hemoglobin was stable.  Lab work also showed a magnesium of 1.4.  Kidney function was stable.  BNP was elevated at 606.  COVID test was negative.  Chest x-ray showed bibasilar pleural effusion, atelectasis greater on the right.  Patient was given a dose of IV Lasix.  Admitted for further management.  Assessment & Plan:  Principal Problem:   Acute CHF (congestive heart failure) (HCC) Active Problems:   S/P cardiac pacemaker procedure   Complete heart block (HCC)   Essential hypertension   COPD (chronic obstructive pulmonary disease) (HCC)   DM2 (diabetes mellitus, type 2) (HCC)   Paroxysmal atrial fibrillation (HCC)   Hx of urinary infection   Candidiasis of skin   Compression fracture of thoracic vertebra, sequela   Hypomagnesemia   Elevated troponin   Suspected acute congestive heart failure: Presented with weakness, dyspnea, lower extremity edema.  Chest imagings with features of CHF.  Elevated BNP.  Patient is not hypoxic, no fever. She was recently started on Levaquin as an outpatient.  It has been discontinued now.  Started on lasix 40 mg daily IV,She takes Lasix 20 mg twice  daily at home.  This morning she almost looks euvolemic.  Echo showed EF of 55 to 60%, indeterminate diastolic filling. Will resume Lasix 20 mg PO twice daily  Elevated troponin: This is most likely from demand ischemia from possible CHF.  Troponin is mildly elevated with flat trend.  No chest pain.  Echo did not show any wall motion abnormality.  Paroxysmal A-fib: At NSR.Recently discontinued Eliquis by PCP.  Likely from high risk for fall.family wants to continue holding it  History of compression fracture of thoracic vertebra: Complains of back pain.  Continue supportive care, pain management.  Currently on gabapentin, Tylenol and also lidocaine patch  Diabetes type 2: Currently on sliding scale insulin.  Monitor blood sugars  History of COPD: Currently not in exacerbation.  Continue bronchodilators as needed  Hypertension: Currently blood pressure stable.  Monitor blood pressure  History of complete heart block: Status post pacemaker.  History of recurrent UTI :Continue prophylactic therapy with trimethoprim   Weakness/deconditioning: We have consulted PT/OT.  Patient ambulates with wheelchair.       DVT prophylaxis:apixaban (ELIQUIS) tablet 2.5 mg Start: 11/20/21 2230 apixaban (ELIQUIS) tablet 2.5 mg     Code Status: DNR  Family Communication: Discussed with family at bedside  Patient status:Inpatient  Patient is from :Home/ILF  Anticipated discharge to:ILF vs SnF  Estimated DC date:1-2 days   Consultants: None  Procedures:None  Antimicrobials:  Anti-infectives (From admission, onward)    Start     Dose/Rate Route Frequency Ordered Stop   11/21/21 1000  trimethoprim (TRIMPEX) tablet 100 mg  100 mg Oral Daily 11/20/21 2226         Subjective:  Patient seen and examined at the bedside today.  Family around the patient.  She was lying on bed, comfortable.  Not in any kind of respiratory distress, on room air, denies any cough or shortness of breath.   Speaking in full sentences.  She has trace bilateral lower extremity edema.  Denies any new complaints today  Objective: Vitals:   11/21/21 0130 11/21/21 0319 11/21/21 0500 11/21/21 0821  BP: (!) 105/45 120/62  (!) 108/52  Pulse: 70 70  71  Resp: '19 18  18  '$ Temp:  98 F (36.7 C)  (!) 97.5 F (36.4 C)  TempSrc:  Oral    SpO2: 95% 93%  94%  Weight:   55.4 kg   Height:   '5\' 2"'$  (1.575 m)     Intake/Output Summary (Last 24 hours) at 11/21/2021 0830 Last data filed at 11/21/2021 6314 Gross per 24 hour  Intake 60 ml  Output --  Net 60 ml   Filed Weights   11/21/21 0500  Weight: 55.4 kg    Examination:  General exam: Overall comfortable, not in distress, very pleasant and elderly female HEENT: PERRL Respiratory system: Mild diminished air entry on both sides, no wheezes or crackles  Cardiovascular system: S1 & S2 heard, RRR.  Gastrointestinal system: Abdomen is nondistended, soft and nontender. Central nervous system: Alert and oriented Extremities: Trace bilateral lower extremity edema, no clubbing ,no cyanosis Skin: No rashes, no ulcers,no icterus     Data Reviewed: I have personally reviewed following labs and imaging studies  CBC: Recent Labs  Lab 11/20/21 1721 11/21/21 0358  WBC 6.2 6.1  NEUTROABS 3.8  --   HGB 11.1* 9.9*  HCT 36.5 32.3*  MCV 83.3 84.1  PLT 318 970   Basic Metabolic Panel: Recent Labs  Lab 11/20/21 1721 11/21/21 0358  NA 141 140  K 4.4 3.9  CL 100 99  CO2 33* 33*  GLUCOSE 120* 137*  BUN 30* 29*  CREATININE 0.96 1.07*  CALCIUM 9.6 9.1  MG 1.4*  --      Recent Results (from the past 240 hour(s))  SARS Coronavirus 2 by RT PCR (hospital order, performed in [facility] hospital lab) *cepheid single result test* Anterior Nasal Swab     Status: None   Collection Time: 11/20/21  3:22 PM   Specimen: Anterior Nasal Swab  Result Value Ref Range Status   SARS Coronavirus 2 by RT PCR NEGATIVE NEGATIVE Final    Comment: (NOTE) SARS-CoV-2  target nucleic acids are NOT DETECTED.  The SARS-CoV-2 RNA is generally detectable in upper and lower respiratory specimens during the acute phase of infection. The lowest concentration of SARS-CoV-2 viral copies this assay can detect is 250 copies / mL. A negative result does not preclude SARS-CoV-2 infection and should not be used as the sole basis for treatment or other patient management decisions.  A negative result may occur with improper specimen collection / handling, submission of specimen other than nasopharyngeal swab, presence of viral mutation(s) within the areas targeted by this assay, and inadequate number of viral copies (<250 copies / mL). A negative result must be combined with clinical observations, patient history, and epidemiological information.  Fact Sheet for Patients:   https://www.patel.info/  Fact Sheet for Healthcare Providers: https://hall.com/  This test is not yet approved or  cleared by the [place] FDA and has been authorized for detection and/or diagnosis of  SARS-CoV-2 by FDA under an Emergency Use Authorization (EUA).  This EUA will remain in effect (meaning this test can be used) for the duration of the COVID-19 declaration under Section 564(b)(1) of the Act, 21 U.S.C. section 360bbb-3(b)(1), unless the authorization is terminated or revoked sooner.  Performed at [facility], [place] [address], [place], [place] [address]      Radiology Studies: DG Chest 2 View  Result Date: 11/20/2021 CLINICAL DATA:  Diagnosed with pneumonia last week, started Levaquin 3 days ago, progressive worsening of symptoms, shortness of breath, decreased appetite, increased weakness, hallucinations. History asthma, COPD, diabetes mellitus, hypertension, heart disease EXAM: CHEST - 2 VIEW COMPARISON:  06/02/2020 FINDINGS: LEFT subclavian sequential transvenous pacemaker leads project at RIGHT atrium and RIGHT  ventricle. Enlargement of cardiac silhouette with pulmonary vascular congestion. Atherosclerotic calcification aorta. Bibasilar effusions and atelectasis greater on RIGHT. No definite infiltrate or pneumothorax. Osseous demineralization with marked compression fracture of a lower thoracic vertebra, new since prior study. IMPRESSION: Bibasilar pleural effusions and atelectasis greater on RIGHT. Enlargement of cardiac silhouette with pulmonary vascular congestion. Aortic Atherosclerosis (ICD10-I70.0). Osseous demineralization with interval marked compression fracture of a lower thoracic vertebra since 06/02/2020. Electronically Signed   By: Lavonia Dana M.D.   On: 11/20/2021 16:05    Scheduled Meds:  apixaban  2.5 mg Oral BID   brinzolamide  1 drop Both Eyes QHS   And   brimonidine  1 drop Both Eyes QHS   cholecalciferol  2,000 Units Oral Daily   cyanocobalamin  1,000 mcg Oral Daily   furosemide  40 mg Intravenous Daily   gabapentin  100 mg Oral QHS   insulin aspart  0-9 Units Subcutaneous TID WC   latanoprost  1 drop Both Eyes QHS   lidocaine  1 patch Transdermal Q24H   loratadine  10 mg Oral Daily   mirabegron ER  25 mg Oral Daily   mometasone-formoterol  2 puff Inhalation BID   nystatin cream   Topical BID   pantoprazole  40 mg Oral Daily   sertraline  25 mg Oral Daily   trimethoprim  100 mg Oral Daily   Continuous Infusions:   LOS: 1 day   Shelly Coss, MD [facility] P9/02/2022, 8:30 AM

## 2021-11-21 NOTE — Evaluation (Signed)
Physical Therapy Evaluation Patient Details Name: Wendy Velasquez MRN: [id number] DOB: July 01, 1925 Today's Date: 11/21/2021  History of Present Illness  88 yo female admitted witih CHF, weakness, LE edema. Hx of OA, anemia, COPD, COVID, HB, pacemaker, Afib, DM, chronic pain (neck,knees)  Clinical Impression  On eval, pt required Mod-Max A for mobility. She was able to attempt standing x 2 with a RW and perform lateral scooting along the edge of bed. Pain was rated 8/10 with activity. Family was present during session. Long discussion about preferences for d/c plan. They are not interested in placement (daughter reports bad experience with previous transition to ALF). Pt currently has about 4 hours of in home care where she lives in independent living with her husband who has dementia. She has started HHPT with [facility]. Pt currently presents with general weakness with decreased ability to safely perform transfers. Patient has not been eating and drinking well. They would like patient to be able to remain in her home but they are concerned about her safety. They are aware that she likely now needs increased supervision/assistance (possibly 24/7 care) but they are unsure of how to go about having this arranged. Recommend TOC consult to assist with planning.      Recommendations for follow up therapy are one component of a multi-disciplinary discharge planning process, led by the attending physician.  Recommendations may be updated based on patient status, additional functional criteria and insurance authorization.  Follow Up Recommendations Home health PT (family is not interested in placement. Will need to try to maximize home health care ([place], [place], home health aide)      Assistance Recommended at Discharge Frequent or constant Supervision/Assistance  Patient can return home with the following  Assist for transportation;Help with stairs or ramp for entrance;Assistance with cooking/housework;A lot of  help with bathing/dressing/bathroom;A lot of help with walking and/or transfers    Equipment Recommendations None recommended by PT  Recommendations for Other Services  OT consult    Functional Status Assessment Patient has had a recent decline in their functional status and demonstrates the ability to make significant improvements in function in a reasonable and predictable amount of time.     Precautions / Restrictions Precautions Precautions: Fall Precaution Comments: incontinent Restrictions Weight Bearing Restrictions: No      Mobility  Bed Mobility Overal bed mobility: Needs Assistance Bed Mobility: Rolling, Sidelying to Sit, Sit to Sidelying Rolling: Min assist Sidelying to sit: Mod assist     Sit to sidelying: Mod assist General bed mobility comments: Assist for trunk and bil LEs. Increased time. Cues for technqiue.    Transfers Overall transfer level: Needs assistance   Transfers: Sit to/from Stand Sit to Stand: Max assist          Lateral/Scoot Transfers: Mod assist General transfer comment: Max A to stand x 2 with RW (for ~8-10 seconds). Lateral scooting along edge of bed with use of bedpad to assist. Cues provided.    Ambulation/Gait               General Gait Details: nonambulatory  Stairs            Wheelchair Mobility    Modified Rankin (Stroke Patients Only)       Balance Overall balance assessment: Needs assistance Sitting-balance support: Bilateral upper extremity supported, Feet supported Sitting balance-Leahy Scale: Fair     Standing balance support: Bilateral upper extremity supported, During functional activity, Reliant on assistive device for balance Standing balance-Leahy Scale: Poor  Pertinent Vitals/Pain Pain Assessment Pain Assessment: 0-10 Pain Score: 8  Pain Location: back (compression fx) Pain Descriptors / Indicators: Discomfort, Grimacing, Sore Pain  Intervention(s): Limited activity within patient's tolerance, Monitored during session, Repositioned    Home Living Family/patient expects to be discharged to:: Private residence Living Arrangements: Spouse/significant other Available Help at Discharge: Family;Available PRN/intermittently Type of Home: Independent living facility Home Access: Level entry       Home Layout: One level Home Equipment: Rollator (4 wheels);Wheelchair - manual      Prior Function Prior Level of Function : Needs assist             Mobility Comments: working with [employer]). transfers only to/from wheelchair ADLs Comments: assist for ADLs     Hand Dominance   Dominant Hand: Right    Extremity/Trunk Assessment   Upper Extremity Assessment Upper Extremity Assessment: Generalized weakness    Lower Extremity Assessment Lower Extremity Assessment: Generalized weakness (strength at least 3/5 throughout; chronic R knee pain significant arthritis)    Cervical / Trunk Assessment Cervical / Trunk Assessment: Kyphotic  Communication   Communication: No difficulties  Cognition Arousal/Alertness: Awake/alert Behavior During Therapy: WFL for tasks assessed/performed Overall Cognitive Status: Within Functional Limits for tasks assessed                                          General Comments      Exercises     Assessment/Plan    PT Assessment Patient needs continued PT services  PT Problem List Decreased strength;Decreased mobility;Decreased range of motion;Decreased activity tolerance;Decreased balance;Decreased knowledge of use of DME;Pain;Decreased skin integrity       PT Treatment Interventions Therapeutic activities;Therapeutic exercise;Patient/family education;Balance training;Functional mobility training    PT Goals (Current goals can be found in the Care Plan section)  Acute Rehab PT Goals Patient Stated Goal: family and patient goal is to return home with  increased support PT Goal Formulation: With patient/family Time For Goal Achievement: 12/05/21 Potential to Achieve Goals: Fair    Frequency Min 3X/week     Co-evaluation               AM-PAC PT "6 Clicks" Mobility  Outcome Measure Help needed turning from your back to your side while in a flat bed without using bedrails?: A Little Help needed moving from lying on your back to sitting on the side of a flat bed without using bedrails?: A Little Help needed moving to and from a bed to a chair (including a wheelchair)?: A Lot Help needed standing up from a chair using your arms (e.g., wheelchair or bedside chair)?: Total Help needed to walk in hospital room?: Total Help needed climbing 3-5 steps with a railing? : Total 6 Click Score: 11    End of Session Equipment Utilized During Treatment: Gait belt Activity Tolerance: Patient tolerated treatment well Patient left: in bed;with call bell/phone within reach;with family/visitor present;with bed alarm set   PT Visit Diagnosis: Muscle weakness (generalized) (M62.81);Other abnormalities of gait and mobility (R26.89)    Time: 1615-1700 PT Time Calculation (min) (ACUTE ONLY): 45 min   Charges:   PT Evaluation $PT Eval Moderate Complexity: 1 Mod PT Treatments $Therapeutic Activity: 23-37 mins         Doreatha Massed, PT Acute Rehabilitation  Office: [phone] Pager: [phone]

## 2021-11-21 NOTE — Progress Notes (Signed)
Order for pt's eye drops that are all due at bedtime were not given per family request. Pt was admitted to the floor late and when eye drops were sent to the floor by pharmacy pt was already resting comfortably. Pt's daughter request that we not give the eye drops at this time since pt is asleep.

## 2021-11-22 DIAGNOSIS — L89321 Pressure ulcer of left buttock, stage 1: Secondary | ICD-10-CM

## 2021-11-22 DIAGNOSIS — B372 Candidiasis of skin and nail: Secondary | ICD-10-CM | POA: Diagnosis not present

## 2021-11-22 DIAGNOSIS — L8990 Pressure ulcer of unspecified site, unspecified stage: Secondary | ICD-10-CM | POA: Insufficient documentation

## 2021-11-22 DIAGNOSIS — I509 Heart failure, unspecified: Secondary | ICD-10-CM | POA: Diagnosis not present

## 2021-11-22 DIAGNOSIS — R778 Other specified abnormalities of plasma proteins: Secondary | ICD-10-CM

## 2021-11-22 DIAGNOSIS — S22000S Wedge compression fracture of unspecified thoracic vertebra, sequela: Secondary | ICD-10-CM | POA: Diagnosis not present

## 2021-11-22 DIAGNOSIS — I442 Atrioventricular block, complete: Secondary | ICD-10-CM | POA: Diagnosis not present

## 2021-11-22 LAB — GLUCOSE, CAPILLARY
Glucose-Capillary: 134 mg/dL — ABNORMAL HIGH (ref 70–99)
Glucose-Capillary: 170 mg/dL — ABNORMAL HIGH (ref 70–99)
Glucose-Capillary: 176 mg/dL — ABNORMAL HIGH (ref 70–99)
Glucose-Capillary: 145 mg/dL — ABNORMAL HIGH (ref 70–99)

## 2021-11-22 LAB — BASIC METABOLIC PANEL
Anion gap: 9 (ref 5–15)
BUN: 26 mg/dL — ABNORMAL HIGH (ref 8–23)
CO2: 32 mmol/L (ref 22–32)
Calcium: 8.8 mg/dL — ABNORMAL LOW (ref 8.9–10.3)
Chloride: 100 mmol/L (ref 98–111)
Creatinine, Ser: 1.08 mg/dL — ABNORMAL HIGH (ref 0.44–1.00)
GFR, Estimated: 48 mL/min — ABNORMAL LOW (ref >60)
Glucose, Bld: 144 mg/dL — ABNORMAL HIGH (ref 70–99)
Potassium: 3.7 mmol/L (ref 3.5–5.1)
Sodium: 141 mmol/L (ref 135–145)

## 2021-11-22 MED ORDER — FUROSEMIDE 20 MG PO TABS: 20.0000 mg | ORAL_TABLET | Freq: Two times a day (BID) | ORAL | 2 refills | Status: DC

## 2021-11-22 MED ORDER — FUROSEMIDE 10 MG/ML IJ SOLN
40.0000 mg | Freq: Every day | INTRAMUSCULAR | Status: DC
  Administered 2021-11-22: 40 mg via INTRAVENOUS
  Filled 2021-11-22: qty 4

## 2021-11-22 NOTE — TOC Initial Note (Addendum)
Transition of Care [facility]) - Initial/Assessment Note    Patient Details  Name: Wendy Velasquez MRN: [id number] Date of Birth: November 02, 1925  [facility]) CM/SW Contact:    Roseanne Kaufman, RN Phone Number: 11/22/2021, 9:55 AM  Clinical Narrative:     Patient from ALF, [facility] and plans to return at discharge. Spoke with patient's daughter [name] who reports HHPT, HHOT, [facility] services are already in place via ALF, and declines any additional [facility] services. Patient has wc, transport wc, hospital bed with air mattress at ALF, no additional DME needed. Patient not appropriate for CHF protocol. Family reports having difficulty transporting patient to hospital would like PTAR at discharge. No current TOC needs.               TOC will continue to follow.  - 1:50pm Spoke with patient's daughter [name] to advise arranging PTAR transportation. [name] reports she does not have the hospital bed set up at the ALF/pt's home, and has requested the RN, MD to wait for d/c until tomorrow.   TOC will continue to follow.   Expected Discharge Plan: Home/Self Care Barriers to Discharge: Continued Medical Work up   Patient Goals and CMS Choice Patient states their goals for this hospitalization and ongoing recovery are:: return to ALF CMS Medicare.gov Compare Post Acute Care list provided to:: Patient Represenative (must comment) [name] (daughter)) Choice offered to / list presented to : Adult Children  Expected Discharge Plan and Services Expected Discharge Plan: Home/Self Care In-house Referral: NA Discharge Planning Services: CM Consult Post Acute Care Choice:  (Home health was recommended, patinet's family declined) Living arrangements for the past 2 months: [place] ([employer])                 DME Arranged: N/A DME Agency: NA       HH Arranged: Refused [place] Agency: NA        Prior Living Arrangements/Services Living arrangements for the past 2 months:  [place] ([employer]) Lives with:: Facility Resident Patient language and need for interpreter reviewed:: Yes Do you feel safe going back to the place where you live?: Yes      Need for Family Participation in Patient Care: Yes (Comment) Care giver support system in place?: Yes (comment) Current home services: Home PT, Home OT, Homehealth aide Criminal Activity/Legal Involvement Pertinent to Current Situation/Hospitalization: No - Comment as needed  Activities of Daily Living Home Assistive Devices/Equipment: Eyeglasses, Wheelchair (toilet riser) ADL Screening (condition at time of admission) Patient's cognitive ability adequate to safely complete daily activities?: Yes Is the patient deaf or have difficulty hearing?: Yes (slightly hoh) Does the patient have difficulty seeing, even when wearing glasses/contacts?: No Does the patient have difficulty concentrating, remembering, or making decisions?: No Patient able to express need for assistance with ADLs?: Yes Does the patient have difficulty dressing or bathing?: Yes Independently performs ADLs?: No Communication: Independent Dressing (OT): Needs assistance Is this a change from baseline?: Pre-admission baseline Grooming: Independent Feeding: Independent Bathing: Needs assistance Is this a change from baseline?: Pre-admission baseline Toileting: Needs assistance Is this a change from baseline?: Pre-admission baseline In/Out Bed: Needs assistance Is this a change from baseline?: Pre-admission baseline Walks in Home: Dependent Is this a change from baseline?: Pre-admission baseline Does the patient have difficulty walking or climbing stairs?: Yes Weakness of Legs: Both Weakness of Arms/Hands: Both (no feeling in all fingers - hx neuropathy)  Permission Sought/Granted Permission sought to share information  with : Case Manager Permission granted to share information with : Yes, Verbal Permission Granted  Share  Information with NAME: Case Manager           Emotional Assessment Appearance:: Appears stated age Attitude/Demeanor/Rapport: Gracious Affect (typically observed): Accepting Orientation: : Oriented to Self, Oriented to Place, Oriented to  Time Alcohol / Substance Use: Not Applicable Psych Involvement: No (comment)  Admission diagnosis:  Acute CHF (congestive heart failure) (HCC) [I50.9] Acute respiratory failure with hypoxia (HCC) [J96.01] Acute congestive heart failure, unspecified heart failure type ([place]) [I50.9] Patient Active Problem List   Diagnosis Date Noted   Pressure injury of skin 11/22/2021   Acute CHF (congestive heart failure) ([place]) 11/20/2021   Hx of urinary infection 11/20/2021   Candidiasis of skin 11/20/2021   Compression fracture of thoracic vertebra, sequela 11/20/2021   Hypomagnesemia 11/20/2021   Elevated troponin 11/20/2021   Paroxysmal atrial fibrillation ([place]) 10/30/2021   New onset a-fib ([place]) 10/11/2021   Secondary hypercoagulable state ([place]) 10/11/2021   At high risk for falls 07/26/2021   Compression fracture of thoracic vertebra with routine healing 07/26/2021   Palliative care encounter 07/26/2021   Hypoglycemia associated with type 2 diabetes mellitus ([place]) 07/25/2021   Failure to thrive in adult 06/02/2020   Cervicalgia 03/23/2020   Urgency incontinence    Hip fracture ([place]) 11/22/2019   Neck pain 04/08/2018   Cervical dystonia 01/02/2018   Chronic neck pain 12/05/2017   Type 2 diabetes mellitus with hyperglycemia, with long-term current use of insulin (HCC)    Esophageal reflux    Depression    Physical deconditioning    Severe protein-calorie malnutrition ([place])    DM2 (diabetes mellitus, type 2) ([place]) 02/25/2015   Anorexia 10/01/2014   S/P cardiac pacemaker procedure 09/13/2012   Complete heart block ([place]) 09/11/2012   Essential hypertension 09/11/2012   Hyperlipidemia - on staint 09/11/2012   COPD (chronic obstructive pulmonary  disease) ([place]) 09/11/2012   Osteoporosis 09/11/2012   GERD (gastroesophageal reflux disease) 09/11/2012   PCP:  [name], MD Pharmacy:   [facility] - [place], [place] - [address] [place] [address] Phone: [phone] Fax: [place], [address] AT [facility] OF [address] [place] [address] Phone: [phone] Fax: [phone]     Social Determinants of Health (SDOH) Interventions    Readmission Risk Interventions    11/22/2021    9:48 AM  Readmission Risk Prevention Plan  Transportation Screening Complete  PCP or Specialist Appt within 5-7 Days Complete  Home Care Screening Complete  Medication Review (RN CM) Complete

## 2021-11-22 NOTE — Progress Notes (Signed)
   11/22/21 1351  Vitals  Temp 97.8 F (36.6 C)  Temp Source Oral  BP (!) 129/53  MAP (mmHg) 74  BP Location Right Arm  BP Method Automatic  Patient Position (if appropriate) Lying  Pulse Rate 68  Pulse Rate Source Monitor  MEWS COLOR  MEWS Score Color Green  Oxygen Therapy  SpO2 (!) 88 %  O2 Device Room Air   NT reports pt's was only 81% RA & came up to 88% with encouragement. Placed pt on 1 L [place], & will reassess. Daughter states concern for her going home today r/t lack of equipment arrangement. Notified Dr. [place]

## 2021-11-22 NOTE — Evaluation (Signed)
Occupational Therapy Evaluation Patient Details Name: Wendy Velasquez MRN: [id number] DOB: 19-Apr-1925 Today's Date: 11/22/2021   History of Present Illness 88 yo female admitted witih CHF, weakness, LE edema. Hx of OA, anemia, COPD, COVID, HB, pacemaker, Afib, DM, chronic pain (neck,knees)   Clinical Impression   Wendy Velasquez is a 88 year old woman who presents with generalized weakness, decreased activity tolerance, and impaired balance. At baseline she has a caregiver who transfers her to wc and needs assistance for ADLs - though she is able to assist with UB. Today she presents close to this baseline. Needed use of stedy for transfer to recliner due to inability to maintain standing balance. From discussion with daughter and PT note - family wanting patient to return home with [facility] services. Patient does not have 24/7 assist and discussed this with patient's daughter. Recommend HH OT at discharge.      Recommendations for follow up therapy are one component of a multi-disciplinary discharge planning process, led by the attending physician.  Recommendations may be updated based on patient status, additional functional criteria and insurance authorization.   Follow Up Recommendations  Home health OT    Assistance Recommended at Discharge Frequent or constant Supervision/Assistance  Patient can return home with the following A lot of help with walking and/or transfers;A lot of help with bathing/dressing/bathroom;Assistance with cooking/housework;Direct supervision/assist for financial management;Assist for transportation;Help with stairs or ramp for entrance;Direct supervision/assist for medications management    Functional Status Assessment  Patient has had a recent decline in their functional status and demonstrates the ability to make significant improvements in function in a reasonable and predictable amount of time.  Equipment Recommendations  None recommended by OT     Recommendations for Other Services       Precautions / Restrictions Precautions Precautions: Fall Precaution Comments: incontinent Restrictions Weight Bearing Restrictions: No      Mobility Bed Mobility Overal bed mobility: Needs Assistance Bed Mobility: Supine to Sit     Supine to sit: Min assist     General bed mobility comments: ABle to trasnfer to edge of bed with bed rail and increased time. MI nassist to pull her forward to edge via bed mat.    Transfers Overall transfer level: Needs assistance   Transfers: Sit to/from Stand Sit to Stand: Mod assist, From elevated surface           General transfer comment: Mod assist with stedy to stand and transfer. Patient able to assist with UB. Transfer via Lift Equipment: Stedy    Balance Overall balance assessment: Needs assistance Sitting-balance support: No upper extremity supported, Feet supported Sitting balance-Leahy Scale: Fair     Standing balance support: Reliant on assistive device for balance Standing balance-Leahy Scale: Poor                             ADL either performed or assessed with clinical judgement   ADL Overall ADL's : At baseline                                       General ADL Comments: Requiires assistance for ADLs. Can feed herself with built up handles and assist with UB ADLs.     Vision Patient Visual Report: No change from baseline       Perception     Praxis  Pertinent Vitals/Pain Pain Assessment Pain Assessment: No/denies pain     Hand Dominance Right   Extremity/Trunk Assessment Upper Extremity Assessment Upper Extremity Assessment: RUE deficits/detail;LUE deficits/detail RUE Deficits / Details: WFL ROM, grossly 4-/5 strength RUE Sensation: WNL RUE Coordination: WNL LUE Deficits / Details: WFL ROM, grossly 4-/5 LUE Sensation: WNL LUE Coordination: WNL   Lower Extremity Assessment Lower Extremity Assessment: Defer to PT  evaluation   Cervical / Trunk Assessment Cervical / Trunk Assessment: Kyphotic   Communication Communication Communication: No difficulties   Cognition Arousal/Alertness: Awake/alert Behavior During Therapy: WFL for tasks assessed/performed Overall Cognitive Status: Within Functional Limits for tasks assessed                                       General Comments       Exercises     Shoulder Instructions      Home Living Family/patient expects to be discharged to:: Private residence Living Arrangements: Spouse/significant other Available Help at Discharge: Family;Available PRN/intermittently Type of Home: Independent living facility Home Access: Level entry     Home Layout: One level               Home Equipment: Rollator (4 wheels);Wheelchair - manual          Prior Functioning/Environment Prior Level of Function : Needs assist             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 transfers only to/from wheelchair with caregiver ADLs Comments: assist for ADLs        OT Problem List: Decreased strength;Decreased activity tolerance;Impaired balance (sitting and/or standing);Decreased knowledge of use of DME or AE      OT Treatment/Interventions:      OT Goals(Current goals can be found in the care plan section) Acute Rehab OT Goals OT Goal Formulation: All assessment and education complete, DC therapy  OT Frequency:      Co-evaluation              AM-PAC OT "6 Clicks" Daily Activity     Outcome Measure Help from another person eating meals?: A Little Help from another person taking care of personal grooming?: A Little Help from another person toileting, which includes using toliet, bedpan, or urinal?: A Lot Help from another person bathing (including washing, rinsing, drying)?: A Lot Help from another person to put on and taking off regular upper body clothing?: A Lot Help from another person to put on and taking off  regular lower body clothing?: Total 6 Click Score: 13   End of Session Equipment Utilized During Treatment: Other (comment) (stedy) Nurse Communication: Mobility status (via white board)  Activity Tolerance: Patient tolerated treatment well Patient left: in chair;with call bell/phone within reach;with family/visitor present  OT Visit Diagnosis: Muscle weakness (generalized) (M62.81)                Time: 6834-1962 OT Time Calculation (min): 22 min Charges:  OT General Charges $OT Visit: 1 Visit OT Evaluation $OT Eval Low Complexity: 1 Low  Gustavo Lah, OTR/L [place]  Office [phone]   Lenward Chancellor 11/22/2021, 12:50 PM

## 2021-11-22 NOTE — Progress Notes (Signed)
PROGRESS NOTE    Wendy Velasquez  TKP:[id number] DOB: 16-May-1925 DOA: 11/20/2021 PCP: [name], MD    Brief Narrative:   Wendy Velasquez is a 88 year old female with past medical history significant for complete heart block s/p PPM, atrial fibrillation no longer on anticoagulation, COPD, type 2 diabetes mellitus, chronic neck pain, depression/anxiety who presented to [facility] ED on 9/12 with complaints of generalized weakness and lower extremity edema.   Patient recently underwent CT chest on 9/8 which was negative for pulmonary embolism but with patchy infiltrates bilateral lower lung fields concerning for multifocal pneumonia; patient was started on Levaquin as an outpatient but continued to have worsening of symptoms which prompted her to seek further evaluation in the ED.   In the ED, patient was normotensive, afebrile.  Lab work did not show any leukocytosis and hemoglobin was stable.  Magnesium 1.4, BNP elevated 606.  COVID-19 PCR negative.  Chest x-ray with bibasilar pleural effusion, atelectasis greatest on the right.  Patient was given IV Lasix in the ED.  TRH consulted for further evaluation management of acute diastolic congestive heart failure exacerbation.  Assessment & Plan:   Acute on chronic diastolic congestive heart failure exacerbation Patient presenting to the ED with progressive weakness, lower extremity edema.  Was found to have elevated BNP of 606 and chest x-ray with bibasilar pleural effusions, atelectasis greater on right with pulmonary vascular congestion.  Patient is afebrile without leukocytosis, procalcitonin within normal limits. TTE with LVEF 55-60%, no LV regional wall motion abnormalities, mild concentric LVH, IVC dilated.   --net negative 829m since admission --wt 55.4>56.1kg --Lasix '40mg'$  IV q24h --Strict I's and O's and daily weights --BMP and BNP in the am  Elevated troponin High sensitive troponin slightly elevated at 21 followed by 24 and  admission.  Etiology likely secondary to type II demand ischemia in the setting of CHF exacerbation as above.  Denies chest pain.  TTE with no regional wall motion abnormalities.   Thoracic vertebrae compression fracture Chest x-ray notable for osseous demineralization with interval marked compression fracture of a lower thoracic vertebrae since March 2022.  Currently on vitamin D supplementation outpatient.  Outpatient follow-up with PCP.   Hx complete heart block s/p PPM Continue outpatient follow-up with cardiology   Paroxysmal atrial fibrillation Recently stopped Eliquis by PCP.   Type 2 diabetes mellitus Hemoglobin A1c 7.9.  Continue home metformin 1000 mg p.o. twice daily, Lantus 8 units subcutaneously daily.   COPD Continue home Advair and albuterol nebs as needed.   Depression/anxiety Continue sertraline 25 mg p.o. daily   Glaucoma Continue Simbrinza eyedrops   Chronic neck pain Continue Botox injections outpatient.   Left buttock stage I pressure ulcer, POA Pressure Injury 11/21/21 Buttocks Left;Lower;Mid Stage 1 -  Intact skin with non-blanchable redness of a localized area usually over a bony prominence. red area with blanchable and non-blanchable areas; foam placed; reassessment each shift (Active)  11/21/21 0701  Location: Buttocks  Location Orientation: Left;Lower;Mid  Staging: Stage 1 -  Intact skin with non-blanchable redness of a localized area usually over a bony prominence.  Wound Description (Comments): red area with blanchable and non-blanchable areas; foam placed; reassessment each shift  Present on Admission: Yes  -- Continue local wound care, frequent offloading       Weakness/deconditioning/debility: Continue home health PT/OT/aide on discharge.   DVT prophylaxis:   SCDs    Code Status: DNR Family Communication: Son-in-law KLanny Hurstpresent at bedside this morning  Disposition Plan:  Level  of care: Telemetry Status is: Inpatient Remains inpatient  appropriate because: IV diuresis    Consultants:  None  Procedures:  TTE  Antimicrobials:  None   Subjective: Seen examined bedside, resting comfortably.  Eating breakfast and enjoying coffee.  No specific complaints this morning.  Son in Sports coach Wendy Velasquez present at bedside.  During ambulation this afternoon, patient was noted to desaturate to 81%.  We will continue IV diuresis today and reassess for possible discharge home tomorrow.  No other specific questions or concerns at this time.  Patient denies headache, no chest pain, no palpitations, no shortness of breath, no abdominal pain, no fever/chills, no nausea/vomiting/diarrhea.  No acute events overnight per nursing staff.  Objective: Vitals:   11/22/21 0524 11/22/21 0736 11/22/21 1022 11/22/21 1351  BP: (!) 120/50  (!) 133/58 (!) 129/53  Pulse: 70  69 68  Resp: 20 18    Temp: 97.9 F (36.6 C)   97.8 F (36.6 C)  TempSrc: Oral   Oral  SpO2: 90% 91% (!) 88% (!) 88%  Weight:      Height:        Intake/Output Summary (Last 24 hours) at 11/22/2021 1612 Last data filed at 11/22/2021 0527 Gross per 24 hour  Intake --  Output 400 ml  Net -400 ml   Filed Weights   11/21/21 0500 11/22/21 0500  Weight: 55.4 kg 56.1 kg    Examination:  Physical Exam: GEN: NAD, alert and oriented x 3, elderly in appearance HEENT: NCAT, PERRL, EOMI, sclera clear, MMM PULM: CTAB w/o wheezes/crackles, normal respiratory effort, on room air at rest with SPO2 90% CV: RRR w/o M/G/R GI: abd soft, NTND, NABS, no R/G/M MSK: no peripheral edema, muscle strength globally intact 5/5 bilateral upper/lower extremities NEURO: CN II-XII intact, no focal deficits, sensation to light touch intact PSYCH: normal mood/affect Integumentary: Left buttock stage I pressure injury noted, otherwise no other concerning rashes/lesions/wounds.    Data Reviewed: I have personally reviewed following labs and imaging studies  CBC: Recent Labs  Lab 11/20/21 1721  11/21/21 0358  WBC 6.2 6.1  NEUTROABS 3.8  --   HGB 11.1* 9.9*  HCT 36.5 32.3*  MCV 83.3 84.1  PLT 318 315   Basic Metabolic Panel: Recent Labs  Lab 11/20/21 1721 11/21/21 0358 11/22/21 0405  NA 141 140 141  K 4.4 3.9 3.7  CL 100 99 100  CO2 33* 33* 32  GLUCOSE 120* 137* 144*  BUN 30* 29* 26*  CREATININE 0.96 1.07* 1.08*  CALCIUM 9.6 9.1 8.8*  MG 1.4*  --   --    GFR: Estimated Creatinine Clearance: 25.2 mL/min (A) (by C-G formula based on SCr of 1.08 mg/dL (H)). Liver Function Tests: Recent Labs  Lab 11/20/21 1721  AST 17  ALT 21  ALKPHOS 68  BILITOT 0.4  PROT 6.8  ALBUMIN 3.7   No results for input(s): "LIPASE", "AMYLASE" in the last 168 hours. No results for input(s): "AMMONIA" in the last 168 hours. Coagulation Profile: No results for input(s): "INR", "PROTIME" in the last 168 hours. Cardiac Enzymes: No results for input(s): "CKTOTAL", "CKMB", "CKMBINDEX", "TROPONINI" in the last 168 hours. BNP (last 3 results) No results for input(s): "PROBNP" in the last 8760 hours. HbA1C: Recent Labs    11/21/21 0358  HGBA1C 7.9*   CBG: Recent Labs  Lab 11/21/21 1150 11/21/21 1647 11/21/21 2020 11/22/21 0743 11/22/21 1257  GLUCAP 107* 94 132* 134* 170*   Lipid Profile: No results for input(s): "CHOL", "HDL", "  [place]", "TRIG", "CHOLHDL", "LDLDIRECT" in the last 72 hours. Thyroid Function Tests: No results for input(s): "TSH", "T4TOTAL", "FREET4", "T3FREE", "THYROIDAB" in the last 72 hours. Anemia Panel: No results for input(s): "VITAMINB12", "FOLATE", "FERRITIN", "TIBC", "IRON", "RETICCTPCT" in the last 72 hours. Sepsis Labs: Recent Labs  Lab 11/20/21 1612  PROCALCITON <0.10    Recent Results (from the past 240 hour(s))  SARS Coronavirus 2 by RT PCR (hospital order, performed in [facility] hospital lab) *cepheid single result test* Anterior Nasal Swab     Status: None   Collection Time: 11/20/21  3:22 PM   Specimen: Anterior Nasal Swab  Result  Value Ref Range Status   SARS Coronavirus 2 by RT PCR NEGATIVE NEGATIVE Final    Comment: (NOTE) SARS-CoV-2 target nucleic acids are NOT DETECTED.  The SARS-CoV-2 RNA is generally detectable in upper and lower respiratory specimens during the acute phase of infection. The lowest concentration of SARS-CoV-2 viral copies this assay can detect is 250 copies / mL. A negative result does not preclude SARS-CoV-2 infection and should not be used as the sole basis for treatment or other patient management decisions.  A negative result may occur with improper specimen collection / handling, submission of specimen other than nasopharyngeal swab, presence of viral mutation(s) within the areas targeted by this assay, and inadequate number of viral copies (<250 copies / mL). A negative result must be combined with clinical observations, patient history, and epidemiological information.  Fact Sheet for Patients:   https://www.patel.info/  Fact Sheet for Healthcare Providers: https://hall.com/  This test is not yet approved or  cleared by the [place] FDA and has been authorized for detection and/or diagnosis of SARS-CoV-2 by FDA under an Emergency Use Authorization (EUA).  This EUA will remain in effect (meaning this test can be used) for the duration of the COVID-19 declaration under Section 564(b)(1) of the Act, 21 U.S.C. section 360bbb-3(b)(1), unless the authorization is terminated or revoked sooner.  Performed at [facility], [place] [address] [place], [place] [address]          Radiology Studies: ECHOCARDIOGRAM COMPLETE  Result Date: 11/21/2021    ECHOCARDIOGRAM REPORT   Patient Name:   Wendy Velasquez Date of Exam: 11/21/2021 Medical Rec #:  [id number]        Height:       62.0 in Accession #:    [id number]       Weight:       122.1 lb Date of Birth:  Aug 30, 1925        BSA:          1.550 m Patient Age:    56 years          BP:           120/62 mmHg Patient Gender: F                HR:           70 bpm. Exam Location:  Inpatient Procedure: 2D Echo, Cardiac Doppler and Color Doppler Indications:    CHF  History:        Patient has no prior history of Echocardiogram examinations.                 Pacemaker, COPD, Arrythmias:Cardiac Arrest; Risk                 Factors:Hypertension, Diabetes and Dyslipidemia.  Sonographer:    Memory Argue Referring Phys: [id number] [place]  1. Left ventricular ejection fraction, by estimation, is 55 to 60%. The left ventricle has normal function. The left ventricle has no regional wall motion abnormalities. There is mild concentric left ventricular hypertrophy. Indeterminate diastolic filling due to E-A fusion.  2. Right ventricular systolic function is normal. The right ventricular size is normal. There is severely elevated pulmonary artery systolic pressure. The estimated right ventricular systolic pressure is 20.9 mmHg.  3. Left atrial size was severely dilated.  4. The mitral valve is degenerative. Trivial mitral valve regurgitation. No evidence of mitral stenosis. Moderate mitral annular calcification.  5. Tricuspid valve regurgitation is mild to moderate.  6. The aortic valve is tricuspid. There is mild calcification of the aortic valve. Aortic valve regurgitation is not visualized. Aortic valve sclerosis is present, with no evidence of aortic valve stenosis.  7. The inferior vena cava is dilated in size with <50% respiratory variability, suggesting right atrial pressure of 15 mmHg. FINDINGS  Left Ventricle: Left ventricular ejection fraction, by estimation, is 55 to 60%. The left ventricle has normal function. The left ventricle has no regional wall motion abnormalities. The left ventricular internal cavity size was normal in size. There is  mild concentric left ventricular hypertrophy. Indeterminate diastolic filling due to E-A fusion. Right Ventricle: The right ventricular size  is normal. No increase in right ventricular wall thickness. Right ventricular systolic function is normal. There is severely elevated pulmonary artery systolic pressure. The tricuspid regurgitant velocity is 3.36 m/s, and with an assumed right atrial pressure of 15 mmHg, the estimated right ventricular systolic pressure is 47.0 mmHg. Left Atrium: Left atrial size was severely dilated. Right Atrium: Right atrial size was normal in size. Pericardium: There is no evidence of pericardial effusion. Mitral Valve: The mitral valve is degenerative in appearance. Moderate mitral annular calcification. Trivial mitral valve regurgitation. No evidence of mitral valve stenosis. Tricuspid Valve: The tricuspid valve is grossly normal. Tricuspid valve regurgitation is mild to moderate. Aortic Valve: The aortic valve is tricuspid. There is mild calcification of the aortic valve. Aortic valve regurgitation is not visualized. Aortic valve sclerosis is present, with no evidence of aortic valve stenosis. Aortic valve mean gradient measures 2.0 mmHg. Aortic valve peak gradient measures 4.5 mmHg. Aortic valve area, by VTI measures 2.27 cm. Pulmonic Valve: The pulmonic valve was grossly normal. Pulmonic valve regurgitation is not visualized. No evidence of pulmonic stenosis. Aorta: The aortic root and ascending aorta are structurally normal, with no evidence of dilitation. Venous: The inferior vena cava is dilated in size with less than 50% respiratory variability, suggesting right atrial pressure of 15 mmHg. IAS/Shunts: The atrial septum is grossly normal. Additional Comments: A device lead is visualized in the right atrium and right ventricle.  LEFT VENTRICLE PLAX 2D LVIDd:         4.30 cm LVIDs:         2.90 cm LV PW:         1.20 cm LV IVS:        1.30 cm LVOT diam:     2.00 cm LV SV:         43 LV SV Index:   28 LVOT Area:     3.14 cm  RIGHT VENTRICLE RV S prime:     10.40 cm/s TAPSE (M-mode): 1.8 cm LEFT ATRIUM             Index         RIGHT ATRIUM  Index LA diam:        3.50 cm 2.26 cm/m   RA Area:     11.00 cm LA Vol (A2C):   75.4 ml 48.64 ml/m  RA Volume:   20.20 ml  13.03 ml/m LA Vol (A4C):   82.7 ml 53.35 ml/m LA Biplane Vol: 79.8 ml 51.48 ml/m  AORTIC VALVE AV Area (Vmax):    2.06 cm AV Area (Vmean):   2.01 cm AV Area (VTI):     2.27 cm AV Vmax:           106.00 cm/s AV Vmean:          69.200 cm/s AV VTI:            0.188 m AV Peak Grad:      4.5 mmHg AV Mean Grad:      2.0 mmHg LVOT Vmax:         69.50 cm/s LVOT Vmean:        44.300 cm/s LVOT VTI:          0.136 m LVOT/AV VTI ratio: 0.72  AORTA Ao Root diam: 2.90 cm Ao Asc diam:  2.80 cm TRICUSPID VALVE TR Peak grad:   45.2 mmHg TR Vmax:        336.00 cm/s  SHUNTS Systemic VTI:  0.14 m Systemic Diam: 2.00 cm Eleonore Chiquito MD Electronically signed by Eleonore Chiquito MD Signature Date/Time: 11/21/2021/12:26:03 PM    Final         Scheduled Meds:  brinzolamide  1 drop Both Eyes QHS   And   brimonidine  1 drop Both Eyes QHS   cholecalciferol  2,000 Units Oral Daily   cyanocobalamin  1,000 mcg Oral Daily   furosemide  40 mg Intravenous Daily   gabapentin  100 mg Oral QHS   Gerhardt's butt cream   Topical TID   insulin aspart  0-9 Units Subcutaneous TID WC   latanoprost  1 drop Both Eyes QHS   lidocaine  1 patch Transdermal Q24H   loratadine  10 mg Oral Daily   mirabegron ER  25 mg Oral Daily   mometasone-formoterol  2 puff Inhalation BID   nystatin cream   Topical BID   pantoprazole  40 mg Oral Daily   sertraline  25 mg Oral Daily   trimethoprim  100 mg Oral Daily   Continuous Infusions:   LOS: 2 days    Time spent: 50 minutes spent on chart review, discussion with nursing staff, consultants, updating family and interview/physical exam; more than 50% of that time was spent in counseling and/or coordination of care.    Brelynn Wheller J [place] DO [facility] Available via Epic secure chat 7am-7pm After these hours, please refer to coverage  provider listed on [email or web address] 11/22/2021, 4:12 PM

## 2021-11-22 NOTE — Discharge Summary (Addendum)
Physician Discharge Summary  Wendy Velasquez QPY:[id number] DOB: Sep 12, 1925 DOA: 11/20/2021  PCP: [name], MD  Admit date: 11/20/2021 Discharge date: 11/24/2021  Admitted From: [facility] Disposition: [facility] assisted living facility  Recommendations for Outpatient Follow-up:  Follow up with PCP in 1-2 weeks Follow-up with cardiology as needed Increased Lasix to 40 mg p.o. twice daily, recommend an additional dose daily if weight gain of 3 pounds in 1 day or 5 pounds in 1 week Encouraged to monitor daily weights and maintain log for next PCP/cardiology visit  Referred to outpatient palliative care.  If no significant improvement in her ADLs/symptoms over the next 1-2 weeks, suspect will need to transition to hospice level of care.  Family/daughters are in agreement with this plan.  Home Health: PT/OT/home health aide Equipment/Devices: Patient already possesses wheelchair, transport wheelchair, hospital bed with air mattress; no other equipment needs identified  Discharge Condition: Stable, but overall prognosis guarded given patient's significant decline over the last year CODE STATUS: DNR Diet recommendation: Heart healthy/consistent carbohydrate diet  History of present illness:  Wendy Velasquez is a 88 year old female with past medical history significant for complete heart block s/p PPM, atrial fibrillation no longer on anticoagulation, COPD, type 2 diabetes mellitus, chronic neck pain, depression/anxiety who presented to [facility] ED on 9/12 with complaints of generalized weakness and lower extremity edema.  Patient recently underwent CT chest on 9/8 which was negative for pulmonary embolism but with patchy infiltrates bilateral lower lung fields concerning for multifocal pneumonia; patient was started on Levaquin as an outpatient but continued to have worsening of symptoms which prompted her to seek further evaluation in the ED.  In  the ED, patient was normotensive, afebrile.  Lab work did not show any leukocytosis and hemoglobin was stable.  Magnesium 1.4, BNP elevated 606.  COVID-19 PCR negative.  Chest x-ray with bibasilar pleural effusion, atelectasis greatest on the right.  Patient was given IV Lasix in the ED.  TRH consulted for further evaluation management of acute diastolic congestive heart failure exacerbation.  Hospital course:  Acute on chronic diastolic congestive heart failure exacerbation Patient presenting to the ED with progressive weakness, lower extremity edema.  Was found to have elevated BNP of 606 and chest x-ray with bibasilar pleural effusions, atelectasis greater on right with pulmonary vascular congestion.  Patient is afebrile without leukocytosis, procalcitonin within normal limits. TTE with LVEF 55-60%, no LV regional wall motion abnormalities, mild concentric LVH, IVC dilated.  Patient was started on IV furosemide with resolution of her symptoms.  Lower extremity edema now resolved.  Furosemide increased to 40 mg p.o. twice daily.  Recommend an additional daily dose if needed for weight gain of 3 pounds in 1 day or 5 pounds in 1 week.  Patient instructed to maintain daily weight log to bring to next outpatient visit.  Outpatient follow-up with PCP/cardiology.  Elevated troponin High sensitive troponin slightly elevated at 21 followed by 24 and admission.  Etiology likely secondary to type II demand ischemia in the setting of CHF exacerbation as above.  Denies chest pain.  TTE with no regional wall motion abnormalities.  Thoracic vertebrae compression fracture Chest x-ray notable for osseous demineralization with interval marked compression fracture of a lower thoracic vertebrae since March 2022.  Currently on vitamin D supplementation outpatient.  Outpatient follow-up with PCP.  Hx complete heart block s/p PPM Continue outpatient follow-up with cardiology  Paroxysmal atrial fibrillation Recently  stopped Eliquis by PCP.  Type 2  diabetes mellitus Hemoglobin A1c 7.9.  Continue home metformin 1000 mg p.o. twice daily, Lantus 8 units subcutaneously daily.  COPD Continue home Advair and albuterol nebs as needed.  Depression/anxiety Continue sertraline 25 mg p.o. daily  Glaucoma Continue Simbrinza eyedrops  Chronic neck pain Continue Botox injections outpatient.  Left buttock stage I pressure ulcer, POA Pressure Injury 11/21/21 Buttocks Left;Lower;Mid Stage 1 -  Intact skin with non-blanchable redness of a localized area usually over a bony prominence. red area with blanchable and non-blanchable areas; foam placed; reassessment each shift (Active)  11/21/21 0701  Location: Buttocks  Location Orientation: Left;Lower;Mid  Staging: Stage 1 -  Intact skin with non-blanchable redness of a localized area usually over a bony prominence.  Wound Description (Comments): red area with blanchable and non-blanchable areas; foam placed; reassessment each shift  Present on Admission: Yes  -- Continue local wound care, frequent offloading  Weakness/deconditioning/debility: Continue home health PT/OT/aide on discharge.  Suspected OSA Patient was noted to desaturate during sleep during the hospitalization.  Daughter is noted that she snores frequently at home; suspect obstructive sleep apnea.  Will discharge home with supplemental oxygen to use as needed.  Goals of care Family reports significant overall decline with decreased appetite and oral intake over the last 1 year.  Patient also with decreased performance of her ADLs as well.  They have utilize our [facility] palliative care in the past.  Discussed with daughters will give another attempt at home health but if no significant improvement will continue with outpatient palliative care and anticipate likely need to transition to hospice in the near future.  Discharge Diagnoses:  Principal Problem:   Acute CHF (congestive heart failure)  (HCC) Active Problems:   S/P cardiac pacemaker procedure   Complete heart block (HCC)   Essential hypertension   COPD (chronic obstructive pulmonary disease) (HCC)   DM2 (diabetes mellitus, type 2) (HCC)   Paroxysmal atrial fibrillation (HCC)   Hx of urinary infection   Candidiasis of skin   Compression fracture of thoracic vertebra, sequela   Hypomagnesemia   Elevated troponin   Pressure injury of skin    Discharge Instructions  Discharge Instructions     Call MD for:  difficulty breathing, headache or visual disturbances   Complete by: As directed    Call MD for:  persistant dizziness or light-headedness   Complete by: As directed    Call MD for:  persistant nausea and vomiting   Complete by: As directed    Call MD for:  severe uncontrolled pain   Complete by: As directed    Call MD for:  temperature >100.4   Complete by: As directed    Diet - low sodium heart healthy   Complete by: As directed    Discharge wound care:   Complete by: As directed    Continue frequent offloading of buttock region   Increase activity slowly   Complete by: As directed    Increase activity slowly   Complete by: As directed    No wound care   Complete by: As directed       Allergies as of 11/24/2021       Reactions   Percocet [oxycodone-acetaminophen] Other (See Comments)   Hallucinations        Medication List     STOP taking these medications    apixaban 2.5 MG Tabs tablet Commonly known as: Eliquis   levofloxacin 500 MG tablet Commonly known as: LEVAQUIN       TAKE these  medications    acetaminophen 325 MG tablet Commonly known as: TYLENOL Take 650 mg by mouth every 6 (six) hours as needed.   albuterol (2.5 MG/3ML) 0.083% nebulizer solution Commonly known as: PROVENTIL Take 3 mLs (2.5 mg total) by nebulization every 6 (six) hours as needed for wheezing or shortness of breath.   Biofreeze 4 % Gel Generic drug: Menthol (Topical Analgesic) Apply 1 application  topically daily as needed (pain). Apply to Right Leg.   Botox 100 units Solr injection Generic drug: botulinum toxin Type A Inject 100 Units into the muscle every 3 (three) months.   cetirizine 10 MG tablet Commonly known as: ZYRTEC Take 10 mg by mouth daily.   Combivent Respimat 20-100 MCG/ACT Aers respimat Generic drug: Ipratropium-Albuterol Inhale 1 puff into the lungs every 8 (eight) hours as needed for wheezing.   cyanocobalamin 1000 MCG tablet Commonly known as: VITAMIN B12 Take 1 tablet (1,000 mcg total) by mouth daily.   esomeprazole 40 MG capsule Commonly known as: NEXIUM Take 40 mg by mouth daily at 12 noon.   famotidine 20 MG tablet Commonly known as: PEPCID Take 20 mg by mouth daily as needed for heartburn or indigestion.   fluticasone 50 MCG/ACT nasal spray Commonly known as: FLONASE Place 2 sprays into both nostrils daily as needed for allergies. SHAKE AS DIRECTED   Fluticasone-Salmeterol 250-50 MCG/DOSE Aepb Commonly known as: ADVAIR Inhale 1 puff into the lungs 2 (two) times daily.   furosemide 40 MG tablet Commonly known as: Lasix Take 1 tablet (40 mg total) by mouth 2 (two) times daily. What changed:  medication strength how much to take   gabapentin 100 MG capsule Commonly known as: NEURONTIN Take 1 capsule (100 mg total) by mouth at bedtime.   ibandronate 150 MG tablet Commonly known as: BONIVA Take 1 tablet (150 mg total) by mouth every 30 (thirty) days. Take in the morning with a full glass of water, on an empty stomach, and do not take anything else by mouth or lie down for the next 30 min.   Lantus SoloStar 100 UNIT/ML Solostar Pen Generic drug: insulin glargine Inject 8 Units into the skin at bedtime.   latanoprost 0.005 % ophthalmic solution Commonly known as: XALATAN Place 1 drop into both eyes at bedtime.   magnesium oxide 400 (240 Mg) MG tablet Commonly known as: MAG-OX Take 1 tablet (400 mg total) by mouth 2 (two) times daily.    metFORMIN 1000 MG tablet Commonly known as: GLUCOPHAGE Take 1 tablet (1,000 mg total) by mouth 2 (two) times daily with a meal.   Myrbetriq 25 MG Tb24 tablet Generic drug: mirabegron ER Take 1 tablet (25 mg total) by mouth daily.   potassium chloride 10 MEQ tablet Commonly known as: KLOR-CON Take 10 mEq by mouth daily as needed.   sertraline 25 MG tablet Commonly known as: ZOLOFT Take 25 mg by mouth daily. What changed: Another medication with the same name was removed. Continue taking this medication, and follow the directions you see here.   Simbrinza 1-0.2 % Susp Generic drug: Brinzolamide-Brimonidine Apply 1 drop to eye at bedtime.   trimethoprim 100 MG tablet Commonly known as: TRIMPEX Take 100 mg by mouth daily.   Vitamin D 50 MCG (2000 UT) tablet Take 2,000 Units by mouth daily.               Durable Medical Equipment  (From admission, onward)           Start  Ordered   11/24/21 0952  For home use only DME oxygen  Once       Question Answer Comment  Length of Need Lifetime   Mode or (Route) Nasal cannula   Liters per Minute 1   Frequency Continuous (stationary and portable oxygen unit needed)   Oxygen conserving device Yes   Oxygen delivery system Gas      11/24/21 0952              Discharge Care Instructions  (From admission, onward)           Start     Ordered   11/24/21 0000  Discharge wound care:       Comments: Continue frequent offloading of buttock region   11/24/21 3016            Follow-up Information     Tisovec, Fransico Him, MD. Schedule an appointment as soon as possible for a visit in 1 week(s).   Specialty: Internal Medicine Contact information: [place] [phone]         Evans Lance, MD .   Specialty: Cardiology Contact information: [phone] [place] [address] [phone]         [facility]. Call.   Contact  information: [place] [phone]               Allergies  Allergen Reactions   Percocet [Oxycodone-Acetaminophen] Other (See Comments)    Hallucinations    Consultations: None   Procedures/Studies: DG CHEST PORT 1 VIEW  Result Date: 11/24/2021 CLINICAL DATA:  Shortness of breath. EXAM: PORTABLE CHEST 1 VIEW COMPARISON:  11/20/2021 FINDINGS: Left-sided pacemaker unchanged. Hazy opacification over the right mid to lower lung and left lung base compatible with layering pleural effusions likely with associated bibasilar atelectasis. Findings unchanged to slightly worse. Cardiomediastinal silhouette and remainder of the exam is unchanged. IMPRESSION: Stable to slightly worse bilateral pleural effusions likely with associated bibasilar atelectasis. Infection in the lung bases is possible. Electronically Signed   By: Marin Olp M.D.   On: 11/24/2021 08:06   ECHOCARDIOGRAM COMPLETE  Result Date: 11/21/2021    ECHOCARDIOGRAM REPORT   Patient Name:   Wendy Velasquez Date of Exam: 11/21/2021 Medical Rec #:  [id number]        Height:       62.0 in Accession #:    [id number]       Weight:       122.1 lb Date of Birth:  17-Jan-1926        BSA:          1.550 m Patient Age:    88 years         BP:           120/62 mmHg Patient Gender: F                HR:           70 bpm. Exam Location:  Inpatient Procedure: 2D Echo, Cardiac Doppler and Color Doppler Indications:    CHF  History:        Patient has no prior history of Echocardiogram examinations.                 Pacemaker, COPD, Arrythmias:Cardiac Arrest; Risk                 Factors:Hypertension, Diabetes and Dyslipidemia.  Sonographer:    Memory Argue  Referring Phys: [id number] [place] T TU IMPRESSIONS  1. Left ventricular ejection fraction, by estimation, is 55 to 60%. The left ventricle has normal function. The left ventricle has no regional wall motion abnormalities. There is mild concentric left  ventricular hypertrophy. Indeterminate diastolic filling due to E-A fusion.  2. Right ventricular systolic function is normal. The right ventricular size is normal. There is severely elevated pulmonary artery systolic pressure. The estimated right ventricular systolic pressure is 13.0 mmHg.  3. Left atrial size was severely dilated.  4. The mitral valve is degenerative. Trivial mitral valve regurgitation. No evidence of mitral stenosis. Moderate mitral annular calcification.  5. Tricuspid valve regurgitation is mild to moderate.  6. The aortic valve is tricuspid. There is mild calcification of the aortic valve. Aortic valve regurgitation is not visualized. Aortic valve sclerosis is present, with no evidence of aortic valve stenosis.  7. The inferior vena cava is dilated in size with <50% respiratory variability, suggesting right atrial pressure of 15 mmHg. FINDINGS  Left Ventricle: Left ventricular ejection fraction, by estimation, is 55 to 60%. The left ventricle has normal function. The left ventricle has no regional wall motion abnormalities. The left ventricular internal cavity size was normal in size. There is  mild concentric left ventricular hypertrophy. Indeterminate diastolic filling due to E-A fusion. Right Ventricle: The right ventricular size is normal. No increase in right ventricular wall thickness. Right ventricular systolic function is normal. There is severely elevated pulmonary artery systolic pressure. The tricuspid regurgitant velocity is 3.36 m/s, and with an assumed right atrial pressure of 15 mmHg, the estimated right ventricular systolic pressure is 86.5 mmHg. Left Atrium: Left atrial size was severely dilated. Right Atrium: Right atrial size was normal in size. Pericardium: There is no evidence of pericardial effusion. Mitral Valve: The mitral valve is degenerative in appearance. Moderate mitral annular calcification. Trivial mitral valve regurgitation. No evidence of mitral valve stenosis.  Tricuspid Valve: The tricuspid valve is grossly normal. Tricuspid valve regurgitation is mild to moderate. Aortic Valve: The aortic valve is tricuspid. There is mild calcification of the aortic valve. Aortic valve regurgitation is not visualized. Aortic valve sclerosis is present, with no evidence of aortic valve stenosis. Aortic valve mean gradient measures 2.0 mmHg. Aortic valve peak gradient measures 4.5 mmHg. Aortic valve area, by VTI measures 2.27 cm. Pulmonic Valve: The pulmonic valve was grossly normal. Pulmonic valve regurgitation is not visualized. No evidence of pulmonic stenosis. Aorta: The aortic root and ascending aorta are structurally normal, with no evidence of dilitation. Venous: The inferior vena cava is dilated in size with less than 50% respiratory variability, suggesting right atrial pressure of 15 mmHg. IAS/Shunts: The atrial septum is grossly normal. Additional Comments: A device lead is visualized in the right atrium and right ventricle.  LEFT VENTRICLE PLAX 2D LVIDd:         4.30 cm LVIDs:         2.90 cm LV PW:         1.20 cm LV IVS:        1.30 cm LVOT diam:     2.00 cm LV SV:         43 LV SV Index:   28 LVOT Area:     3.14 cm  RIGHT VENTRICLE RV S prime:     10.40 cm/s TAPSE (M-mode): 1.8 cm LEFT ATRIUM             Index        RIGHT ATRIUM  Index LA diam:        3.50 cm 2.26 cm/m   RA Area:     11.00 cm LA Vol (A2C):   75.4 ml 48.64 ml/m  RA Volume:   20.20 ml  13.03 ml/m LA Vol (A4C):   82.7 ml 53.35 ml/m LA Biplane Vol: 79.8 ml 51.48 ml/m  AORTIC VALVE AV Area (Vmax):    2.06 cm AV Area (Vmean):   2.01 cm AV Area (VTI):     2.27 cm AV Vmax:           106.00 cm/s AV Vmean:          69.200 cm/s AV VTI:            0.188 m AV Peak Grad:      4.5 mmHg AV Mean Grad:      2.0 mmHg LVOT Vmax:         69.50 cm/s LVOT Vmean:        44.300 cm/s LVOT VTI:          0.136 m LVOT/AV VTI ratio: 0.72  AORTA Ao Root diam: 2.90 cm Ao Asc diam:  2.80 cm TRICUSPID VALVE TR Peak grad:    45.2 mmHg TR Vmax:        336.00 cm/s  SHUNTS Systemic VTI:  0.14 m Systemic Diam: 2.00 cm Eleonore Chiquito MD Electronically signed by Eleonore Chiquito MD Signature Date/Time: 11/21/2021/12:26:03 PM    Final    DG Chest 2 View  Result Date: 11/20/2021 CLINICAL DATA:  Diagnosed with pneumonia last week, started Levaquin 3 days ago, progressive worsening of symptoms, shortness of breath, decreased appetite, increased weakness, hallucinations. History asthma, COPD, diabetes mellitus, hypertension, heart disease EXAM: CHEST - 2 VIEW COMPARISON:  06/02/2020 FINDINGS: LEFT subclavian sequential transvenous pacemaker leads project at RIGHT atrium and RIGHT ventricle. Enlargement of cardiac silhouette with pulmonary vascular congestion. Atherosclerotic calcification aorta. Bibasilar effusions and atelectasis greater on RIGHT. No definite infiltrate or pneumothorax. Osseous demineralization with marked compression fracture of a lower thoracic vertebra, new since prior study. IMPRESSION: Bibasilar pleural effusions and atelectasis greater on RIGHT. Enlargement of cardiac silhouette with pulmonary vascular congestion. Aortic Atherosclerosis (ICD10-I70.0). Osseous demineralization with interval marked compression fracture of a lower thoracic vertebra since 06/02/2020. Electronically Signed   By: Lavonia Dana M.D.   On: 11/20/2021 16:05   CT CHEST W CONTRAST  Addendum Date: 11/17/2021   ADDENDUM REPORT: 11/17/2021 12:37 ADDENDUM: Following should be added to the impression. There is severe compression fracture of body of T11 vertebra with retropulsion of posterior margin of vertebra causing spinal stenosis. There is interval worsening of compression fracture of T11 vertebra in comparison with the thoracic spine radiographs done on 07/14/2021. If clinically warranted, follow-up MRI may be considered. There is mild decrease in height of body of T3 vertebra which has not changed significantly suggesting old compression. These  results will be called to the ordering clinician or representative by the Radiologist Assistant, and communication documented in the PACS or [employer]. Electronically Signed   By: Elmer Picker M.D.   On: 11/17/2021 12:37   Result Date: 11/17/2021 CLINICAL DATA:  Pleural effusion, shortness of breath, edema in the lower extremities EXAM: CT CHEST WITH CONTRAST TECHNIQUE: Multidetector CT imaging of the chest was performed during intravenous contrast administration. RADIATION DOSE REDUCTION: This exam was performed according to the departmental dose-optimization program which includes automated exposure control, adjustment of the mA and/or kV according to patient size and/or use of iterative  reconstruction technique. CONTRAST:  3m ISOVUE-300 IOPAMIDOL (ISOVUE-300) INJECTION 61% COMPARISON:  Chest radiograph done on 06/02/2020 FINDINGS: Cardiovascular: There is homogeneous enhancement in thoracic aorta. There are scattered calcifications in thoracic aorta and coronary arteries. There are no intraluminal filling defects in central pulmonary artery branches. Evaluation of small peripheral branches in the lower lung fields is limited by infiltrates. There is ectasia of the main pulmonary artery measuring 3.5 cm suggesting pulmonary arterial hypertension. Pacemaker battery is seen in the left infraclavicular region. Dense calcifications are seen in mitral annulus. Mediastinum/Nodes: No significant lymphadenopathy is seen in mediastinum. Lungs/Pleura: There are patchy infiltrates in right middle lobe, lingula and both lower lobes suggesting possible multifocal pneumonia. Small to moderate bilateral pleural effusions are seen, more so on the right side. There is large fixed hiatal hernia containing most of the stomach under the right hemidiaphragm. There is no pneumothorax. Upper Abdomen: There is fatty infiltration in liver. Musculoskeletal: There is marked compression fracture in the body of T11 vertebra.  There is retropulsion of posterior margin causing spinal stenosis. There is mild decrease in height of body of T3 vertebra. There is edema in subcutaneous plane, possibly suggesting anasarca. IMPRESSION: There is no evidence of central pulmonary artery embolism. There is ectasia of the main pulmonary artery suggesting pulmonary arterial hypertension. There is no evidence of thoracic aortic dissection. Coronary artery calcifications are seen. There are patchy infiltrates in both lower lung fields suggesting multifocal atelectasis/pneumonia, mostly in right lower lobe. Follow-up studies should be considered to assess resolution and to rule out any central obstructive neoplastic process. Small to moderate bilateral pleural effusions, more so on the right side. Other findings as described in the body of the report. Fatty liver. Electronically Signed: By: PElmer PickerM.D. On: 11/17/2021 12:04     Subjective: Patient seen examined bedside, resting comfortably.  Son-in-law present.  No specific complaints this morning.  Eating breakfast, enjoying her coffee.  Ready for discharge home.  Lower extremity edema has now resolved.  Discussed with patient and family at bedside need to monitor the amount of salt intake as well as recommend daily weights.  No other specific questions or concerns at this time.  Denies headache, no dizziness, no chest pain, no palpitations, no shortness of breath, no abdominal pain, no fever/chills/night sweats, no nausea/vomiting/diarrhea, no focal weakness, no fatigue, no paresthesias.  No acute events overnight per nursing staff.  Discharge Exam: Vitals:   11/24/21 0706 11/24/21 0740  BP: (!) 114/49   Pulse: 70   Resp:    Temp:    SpO2:  97%   Vitals:   11/24/21 0430 11/24/21 0500 11/24/21 0706 11/24/21 0740  BP: (!) 134/119  (!) 114/49   Pulse: 69  70   Resp: 18     Temp: 98.4 F (36.9 C)     TempSrc: Oral     SpO2: 96%   97%  Weight:  55 kg    Height:         Physical Exam: GEN: NAD, alert and oriented x 3, elderly in appearance HEENT: NCAT, PERRL, EOMI, sclera clear, MMM PULM: CTAB w/o wheezes/crackles, normal respiratory effort, on 1 L nasal cannula CV: RRR w/o M/G/R GI: abd soft, NTND, NABS, no R/G/M MSK: no peripheral edema, muscle strength globally intact 5/5 bilateral upper/lower extremities NEURO: CN II-XII intact, no focal deficits, sensation to light touch intact PSYCH: normal mood/affect Integumentary: Stage I left buttock pressure ulcer noted, otherwise no concerning rashes/lesions/wounds noted on exposed skin surfaces  The results of significant diagnostics from this hospitalization (including imaging, microbiology, ancillary and laboratory) are listed below for reference.     Microbiology: Recent Results (from the past 240 hour(s))  SARS Coronavirus 2 by RT PCR (hospital order, performed in [facility] hospital lab) *cepheid single result test* Anterior Nasal Swab     Status: None   Collection Time: 11/20/21  3:22 PM   Specimen: Anterior Nasal Swab  Result Value Ref Range Status   SARS Coronavirus 2 by RT PCR NEGATIVE NEGATIVE Final    Comment: (NOTE) SARS-CoV-2 target nucleic acids are NOT DETECTED.  The SARS-CoV-2 RNA is generally detectable in upper and lower respiratory specimens during the acute phase of infection. The lowest concentration of SARS-CoV-2 viral copies this assay can detect is 250 copies / mL. A negative result does not preclude SARS-CoV-2 infection and should not be used as the sole basis for treatment or other patient management decisions.  A negative result may occur with improper specimen collection / handling, submission of specimen other than nasopharyngeal swab, presence of viral mutation(s) within the areas targeted by this assay, and inadequate number of viral copies (<250 copies / mL). A negative result must be combined with clinical observations, patient history, and epidemiological  information.  Fact Sheet for Patients:   https://www.patel.info/  Fact Sheet for Healthcare Providers: https://hall.com/  This test is not yet approved or  cleared by the [place] FDA and has been authorized for detection and/or diagnosis of SARS-CoV-2 by FDA under an Emergency Use Authorization (EUA).  This EUA will remain in effect (meaning this test can be used) for the duration of the COVID-19 declaration under Section 564(b)(1) of the Act, 21 U.S.C. section 360bbb-3(b)(1), unless the authorization is terminated or revoked sooner.  Performed at [facility], [place] [address], [place], [place] [address]      Labs: BNP (last 3 results) Recent Labs    11/20/21 1723 11/23/21 0955 11/24/21 0414  BNP 606.7* 501.6* 633.3*   Basic Metabolic Panel: Recent Labs  Lab 11/20/21 1721 11/21/21 0358 11/22/21 0405 11/23/21 0415 11/24/21 0414  NA 141 140 141 140 141  K 4.4 3.9 3.7 3.6 4.0  CL 100 99 100 99 98  CO2 33* 33* 32 34* 34*  GLUCOSE 120* 137* 144* 178* 252*  BUN 30* 29* 26* 26* 25*  CREATININE 0.96 1.07* 1.08* 0.82 0.98  CALCIUM 9.6 9.1 8.8* 8.7* 9.1  MG 1.4*  --   --  1.6* 1.8   Liver Function Tests: Recent Labs  Lab 11/20/21 1721  AST 17  ALT 21  ALKPHOS 68  BILITOT 0.4  PROT 6.8  ALBUMIN 3.7   No results for input(s): "LIPASE", "AMYLASE" in the last 168 hours. No results for input(s): "AMMONIA" in the last 168 hours. CBC: Recent Labs  Lab 11/20/21 1721 11/21/21 0358  WBC 6.2 6.1  NEUTROABS 3.8  --   HGB 11.1* 9.9*  HCT 36.5 32.3*  MCV 83.3 84.1  PLT 318 269   Cardiac Enzymes: No results for input(s): "CKTOTAL", "CKMB", "CKMBINDEX", "TROPONINI" in the last 168 hours. BNP: Invalid input(s): "POCBNP" CBG: Recent Labs  Lab 11/23/21 0727 11/23/21 1146 11/23/21 1543 11/23/21 2041 11/24/21 0721  GLUCAP 134* 199* 207* 224* 240*   D-Dimer No results for input(s): "DDIMER" in  the last 72 hours. Hgb A1c No results for input(s): "HGBA1C" in the last 72 hours.  Lipid Profile No results for input(s): "CHOL", "HDL", "LDLCALC", "TRIG", "CHOLHDL", "LDLDIRECT" in the last  72 hours. Thyroid function studies No results for input(s): "TSH", "T4TOTAL", "T3FREE", "THYROIDAB" in the last 72 hours.  Invalid input(s): "FREET3" Anemia work up No results for input(s): "VITAMINB12", "FOLATE", "FERRITIN", "TIBC", "IRON", "RETICCTPCT" in the last 72 hours. Urinalysis    Component Value Date/Time   COLORURINE YELLOW 11/20/2021 2059   APPEARANCEUR CLEAR 11/20/2021 2059   LABSPEC 1.011 11/20/2021 2059   PHURINE 8.0 11/20/2021 2059   GLUCOSEU NEGATIVE 11/20/2021 2059   HGBUR NEGATIVE 11/20/2021 2059   BILIRUBINUR NEGATIVE 11/20/2021 2059   KETONESUR NEGATIVE 11/20/2021 2059   PROTEINUR NEGATIVE 11/20/2021 2059   UROBILINOGEN 0.2 09/11/2012 0927   NITRITE NEGATIVE 11/20/2021 2059   LEUKOCYTESUR SMALL (A) 11/20/2021 2059   Sepsis Labs Recent Labs  Lab 11/20/21 1721 11/21/21 0358  WBC 6.2 6.1   Microbiology Recent Results (from the past 240 hour(s))  SARS Coronavirus 2 by RT PCR (hospital order, performed in [facility] hospital lab) *cepheid single result test* Anterior Nasal Swab     Status: None   Collection Time: 11/20/21  3:22 PM   Specimen: Anterior Nasal Swab  Result Value Ref Range Status   SARS Coronavirus 2 by RT PCR NEGATIVE NEGATIVE Final    Comment: (NOTE) SARS-CoV-2 target nucleic acids are NOT DETECTED.  The SARS-CoV-2 RNA is generally detectable in upper and lower respiratory specimens during the acute phase of infection. The lowest concentration of SARS-CoV-2 viral copies this assay can detect is 250 copies / mL. A negative result does not preclude SARS-CoV-2 infection and should not be used as the sole basis for treatment or other patient management decisions.  A negative result may occur with improper specimen collection / handling, submission of  specimen other than nasopharyngeal swab, presence of viral mutation(s) within the areas targeted by this assay, and inadequate number of viral copies (<250 copies / mL). A negative result must be combined with clinical observations, patient history, and epidemiological information.  Fact Sheet for Patients:   https://www.patel.info/  Fact Sheet for Healthcare Providers: https://hall.com/  This test is not yet approved or  cleared by the [place] FDA and has been authorized for detection and/or diagnosis of SARS-CoV-2 by FDA under an Emergency Use Authorization (EUA).  This EUA will remain in effect (meaning this test can be used) for the duration of the COVID-19 declaration under Section 564(b)(1) of the Act, 21 U.S.C. section 360bbb-3(b)(1), unless the authorization is terminated or revoked sooner.  Performed at [facility], [place] [address] [place], [place] [address]      Time coordinating discharge: Over 30 minutes  SIGNED:   Sirius Woodford J [place] DO  [facility] 11/24/2021, 9:55 AM

## 2021-11-22 NOTE — Discharge Instructions (Signed)
Take an additional dose of lasix '20mg'$  daily for weight gain of 3 pounds in one day or 5 pounds in one week

## 2021-11-23 DIAGNOSIS — B372 Candidiasis of skin and nail: Secondary | ICD-10-CM | POA: Diagnosis not present

## 2021-11-23 DIAGNOSIS — S22000S Wedge compression fracture of unspecified thoracic vertebra, sequela: Secondary | ICD-10-CM | POA: Diagnosis not present

## 2021-11-23 DIAGNOSIS — I442 Atrioventricular block, complete: Secondary | ICD-10-CM | POA: Diagnosis not present

## 2021-11-23 DIAGNOSIS — I509 Heart failure, unspecified: Secondary | ICD-10-CM | POA: Diagnosis not present

## 2021-11-23 LAB — BASIC METABOLIC PANEL
Anion gap: 7 (ref 5–15)
BUN: 26 mg/dL — ABNORMAL HIGH (ref 8–23)
CO2: 34 mmol/L — ABNORMAL HIGH (ref 22–32)
Calcium: 8.7 mg/dL — ABNORMAL LOW (ref 8.9–10.3)
Chloride: 99 mmol/L (ref 98–111)
Creatinine, Ser: 0.82 mg/dL (ref 0.44–1.00)
GFR, Estimated: >60 mL/min (ref >60)
Glucose, Bld: 178 mg/dL — ABNORMAL HIGH (ref 70–99)
Potassium: 3.6 mmol/L (ref 3.5–5.1)
Sodium: 140 mmol/L (ref 135–145)

## 2021-11-23 LAB — GLUCOSE, CAPILLARY
Glucose-Capillary: 134 mg/dL — ABNORMAL HIGH (ref 70–99)
Glucose-Capillary: 199 mg/dL — ABNORMAL HIGH (ref 70–99)
Glucose-Capillary: 207 mg/dL — ABNORMAL HIGH (ref 70–99)
Glucose-Capillary: 224 mg/dL — ABNORMAL HIGH (ref 70–99)

## 2021-11-23 LAB — BRAIN NATRIURETIC PEPTIDE: B Natriuretic Peptide: 501.6 pg/mL — ABNORMAL HIGH (ref 0.0–100.0)

## 2021-11-23 LAB — MAGNESIUM: Magnesium: 1.6 mg/dL — ABNORMAL LOW (ref 1.7–2.4)

## 2021-11-23 MED ORDER — POTASSIUM CHLORIDE CRYS ER 20 MEQ PO TBCR
40.0000 meq | EXTENDED_RELEASE_TABLET | Freq: Once | ORAL | Status: AC
  Administered 2021-11-23: 40 meq via ORAL
  Filled 2021-11-23: qty 2

## 2021-11-23 MED ORDER — MAGNESIUM OXIDE -MG SUPPLEMENT 400 (240 MG) MG PO TABS
400.0000 mg | ORAL_TABLET | Freq: Two times a day (BID) | ORAL | Status: DC
  Administered 2021-11-23 – 2021-11-24 (×3): 400 mg via ORAL
  Filled 2021-11-23 (×3): qty 1

## 2021-11-23 MED ORDER — FUROSEMIDE 10 MG/ML IJ SOLN
40.0000 mg | Freq: Two times a day (BID) | INTRAMUSCULAR | Status: DC
  Administered 2021-11-23 (×2): 40 mg via INTRAVENOUS
  Filled 2021-11-23 (×2): qty 4

## 2021-11-23 NOTE — Telephone Encounter (Signed)
Patient hospitalized

## 2021-11-23 NOTE — TOC Progression Note (Signed)
Transition of Care [facility]) - Progression Note    Patient Details  Name: Wendy Velasquez MRN: [id number] Date of Birth: 1925-11-15  [facility] of [facility] CM/SW Contact  Roseanne Kaufman, RN Phone Number: 11/23/2021, 12:23 PM  Clinical Narrative:   Received request from MD for outreach to [facility] for palliative services, patient has used [facility] previously. Referral made to Sarah at [facility] to resume outpatient palliative services. Plan for discharge to ALF on tomorrow.  TOC will continue to follow.   Expected Discharge Plan: Home/Self Care Barriers to Discharge: Continued Medical Work up  Expected Discharge Plan and Services Expected Discharge Plan: Home/Self Care In-house Referral: NA Discharge Planning Services: CM Consult Post Acute Care Choice:  (Home health was recommended, patinet's family declined) Living arrangements for the past 2 months: [facility] [place]) Expected Discharge Date: 11/22/21               DME Arranged: N/A DME Agency: NA       HH Arranged: Refused [place] Agency: NA         Social Determinants of Health (SDOH) Interventions    Readmission Risk Interventions    11/22/2021    9:48 AM  Readmission Risk Prevention Plan  Transportation Screening Complete  PCP or Specialist Appt within 5-7 Days Complete  Home Care Screening Complete  Medication Review (RN CM) Complete

## 2021-11-23 NOTE — Care Management Important Message (Signed)
Important Message  Patient Details IM Letter placed in Patients room for Family Name: Wendy Velasquez MRN: [id number] Date of Birth: 1925/09/05   Medicare Important Message Given:  Yes     Kerin Salen 11/23/2021, 10:19 AM

## 2021-11-23 NOTE — Progress Notes (Signed)
PROGRESS NOTE    Wendy Velasquez  VPX:[id number] DOB: 12-11-1925 DOA: 11/20/2021 PCP: [name], MD    Brief Narrative:   Wendy Velasquez is a 88 year old female with past medical history significant for complete heart block s/p PPM, atrial fibrillation no longer on anticoagulation, COPD, type 2 diabetes mellitus, chronic neck pain, depression/anxiety who presented to [facility] ED on 9/12 with complaints of generalized weakness and lower extremity edema.   Patient recently underwent CT chest on 9/8 which was negative for pulmonary embolism but with patchy infiltrates bilateral lower lung fields concerning for multifocal pneumonia; patient was started on Levaquin as an outpatient but continued to have worsening of symptoms which prompted her to seek further evaluation in the ED.   In the ED, patient was normotensive, afebrile.  Lab work did not show any leukocytosis and hemoglobin was stable.  Magnesium 1.4, BNP elevated 606.  COVID-19 PCR negative.  Chest x-ray with bibasilar pleural effusion, atelectasis greatest on the right.  Patient was given IV Lasix in the ED.  TRH consulted for further evaluation management of acute diastolic congestive heart failure exacerbation.  Assessment & Plan:   Acute on chronic diastolic congestive heart failure exacerbation Patient presenting to the ED with progressive weakness, lower extremity edema.  Was found to have elevated BNP of 606 and chest x-ray with bibasilar pleural effusions, atelectasis greater on right with pulmonary vascular congestion.  Patient is afebrile without leukocytosis, procalcitonin within normal limits. TTE with LVEF 55-60%, no LV regional wall motion abnormalities, mild concentric LVH, IVC dilated.   --net negative 932m since admission --wt 55.4>56.1>55.6kg --Lasix '40mg'$  IV q12h --Strict I's and O's and daily weights --BMP and BNP in the am  Elevated troponin High sensitive troponin slightly elevated at 21 followed by 24 and  admission.  Etiology likely secondary to type II demand ischemia in the setting of CHF exacerbation as above.  Denies chest pain.  TTE with no regional wall motion abnormalities.   Thoracic vertebrae compression fracture Chest x-ray notable for osseous demineralization with interval marked compression fracture of a lower thoracic vertebrae since March 2022.  Currently on vitamin D supplementation outpatient.  Outpatient follow-up with PCP.   Hx complete heart block s/p PPM Continue outpatient follow-up with cardiology   Paroxysmal atrial fibrillation Recently stopped Eliquis by PCP.   Type 2 diabetes mellitus Hemoglobin A1c 7.9.  Continue home metformin 1000 mg p.o. twice daily, Lantus 8 units subcutaneously daily.   COPD Continue home Advair and albuterol nebs as needed.   Depression/anxiety Continue sertraline 25 mg p.o. daily   Glaucoma Continue Simbrinza eyedrops   Chronic neck pain Continue Botox injections outpatient.   Left buttock stage I pressure ulcer, POA Pressure Injury 11/21/21 Buttocks Left;Lower;Mid Stage 1 -  Intact skin with non-blanchable redness of a localized area usually over a bony prominence. red area with blanchable and non-blanchable areas; foam placed; reassessment each shift (Active)  11/21/21 0701  Location: Buttocks  Location Orientation: Left;Lower;Mid  Staging: Stage 1 -  Intact skin with non-blanchable redness of a localized area usually over a bony prominence.  Wound Description (Comments): red area with blanchable and non-blanchable areas; foam placed; reassessment each shift  Present on Admission: Yes  --Continue local wound care, frequent offloading   Weakness/deconditioning/debility: Continue home health PT/OT/aide on discharge.  Goals of Care Long discussion with patient's daughter, [name] CTye [place] bedside.  Their mother has had a significant decline over the past year with more progressive decline over the  last month.  She has a poor  appetite and eats very little during the day.  Discussed with them that she may be nearing hospice level of care.  They have engaged with outpatient palliative care, [facility] in the past; and are interested in reaching out again to reestablish services.  Discussed likely anticipate discharge home with home health and outpatient palliative care to follow; and if no significant improvement of symptoms and overall condition over the next 1-2 weeks would recommend transitioning to hospice which they are in agreement.  Reached out to [facility] to initiate reengagement with palliative/hospice liaison while patient remains hospitalized.   DVT prophylaxis:   SCDs    Code Status: DNR Family Communication: Updated daughter's, Wendy Velasquez and Wendy Velasquez at bedside this am  Disposition Plan:  Level of care: Telemetry Status is: Inpatient Remains inpatient appropriate because: IV diuresis; anticipate discharge home in 1-2 days with home health, outpatient palliative care and eventually likely hospice at home.    Consultants:  None  Procedures:  TTE  Antimicrobials:  None   Subjective: Seen examined bedside, resting comfortably.  Lying in bed, resting comfortably.  Asking where her coffee is.  Daughter is present.  Oxygen turned off by RN this a.m. and desaturated into the low 70s, replaced on 1 L nasal cannula.  Discussed with daughters regarding overall poor prognosis and likely nearing need for home hospice which they are in agreement.  We will reach out to hospital liaison to likely initiate outpatient palliative care on discharge with likely need of home hospice in the short/near future.  No other specific questions or concerns at this time.  Patient denies headache, no chest pain, no palpitations, no shortness of breath, no abdominal pain, no fever/chills, no nausea/vomiting/diarrhea.  No acute events overnight per nursing staff.  Objective: Vitals:   11/23/21 0429 11/23/21 0434 11/23/21 0733 11/23/21 0815   BP: (!) 120/45     Pulse: 70     Resp: 13     Temp: 98.2 F (36.8 C)     TempSrc: Oral     SpO2: 98%  99% 98%  Weight:  55.6 kg    Height:        Intake/Output Summary (Last 24 hours) at 11/23/2021 1122 Last data filed at 11/22/2021 2200 Gross per 24 hour  Intake 240 ml  Output 300 ml  Net -60 ml   Filed Weights   11/21/21 0500 11/22/21 0500 11/23/21 0434  Weight: 55.4 kg 56.1 kg 55.6 kg    Examination:  Physical Exam: GEN: NAD, alert and oriented x 3, elderly in appearance HEENT: NCAT, PERRL, EOMI, sclera clear, MMM PULM: CTAB w/o wheezes/crackles, normal respiratory effort, on 1L [place] at rest GI: abd soft, NTND, NABS, no R/G/M MSK: no peripheral edema, muscle strength globally intact 5/5 bilateral upper/lower extremities NEURO: CN II-XII intact, no focal deficits, sensation to light touch intact PSYCH: normal mood/affect Integumentary: Left buttock stage I pressure injury noted, otherwise no other concerning rashes/lesions/wounds.    Data Reviewed: I have personally reviewed following labs and imaging studies  CBC: Recent Labs  Lab 11/20/21 1721 11/21/21 0358  WBC 6.2 6.1  NEUTROABS 3.8  --   HGB 11.1* 9.9*  HCT 36.5 32.3*  MCV 83.3 84.1  PLT 318 751   Basic Metabolic Panel: Recent Labs  Lab 11/20/21 1721 11/21/21 0358 11/22/21 0405 11/23/21 0415  NA 141 140 141 140  K 4.4 3.9 3.7 3.6  CL 100 99 100 99  CO2 33* 33* 32  34*  GLUCOSE 120* 137* 144* 178*  BUN 30* 29* 26* 26*  CREATININE 0.96 1.07* 1.08* 0.82  CALCIUM 9.6 9.1 8.8* 8.7*  MG 1.4*  --   --  1.6*   GFR: Estimated Creatinine Clearance: 33.2 mL/min (by C-G formula based on SCr of 0.82 mg/dL). Liver Function Tests: Recent Labs  Lab 11/20/21 1721  AST 17  ALT 21  ALKPHOS 68  BILITOT 0.4  PROT 6.8  ALBUMIN 3.7   No results for input(s): "LIPASE", "AMYLASE" in the last 168 hours. No results for input(s): "AMMONIA" in the last 168 hours. Coagulation Profile: No results for input(s):  "INR", "PROTIME" in the last 168 hours. Cardiac Enzymes: No results for input(s): "CKTOTAL", "CKMB", "CKMBINDEX", "TROPONINI" in the last 168 hours. BNP (last 3 results) No results for input(s): "PROBNP" in the last 8760 hours. HbA1C: Recent Labs    11/21/21 0358  HGBA1C 7.9*   CBG: Recent Labs  Lab 11/22/21 0743 11/22/21 1257 11/22/21 1653 11/22/21 2029 11/23/21 0727  GLUCAP 134* 170* 176* 145* 134*   Lipid Profile: No results for input(s): "CHOL", "HDL", "LDLCALC", "TRIG", "CHOLHDL", "LDLDIRECT" in the last 72 hours. Thyroid Function Tests: No results for input(s): "TSH", "T4TOTAL", "FREET4", "T3FREE", "THYROIDAB" in the last 72 hours. Anemia Panel: No results for input(s): "VITAMINB12", "FOLATE", "FERRITIN", "TIBC", "IRON", "RETICCTPCT" in the last 72 hours. Sepsis Labs: Recent Labs  Lab 11/20/21 1612  PROCALCITON <0.10    Recent Results (from the past 240 hour(s))  SARS Coronavirus 2 by RT PCR (hospital order, performed in [facility] hospital lab) *cepheid single result test* Anterior Nasal Swab     Status: None   Collection Time: 11/20/21  3:22 PM   Specimen: Anterior Nasal Swab  Result Value Ref Range Status   SARS Coronavirus 2 by RT PCR NEGATIVE NEGATIVE Final    Comment: (NOTE) SARS-CoV-2 target nucleic acids are NOT DETECTED.  The SARS-CoV-2 RNA is generally detectable in upper and lower respiratory specimens during the acute phase of infection. The lowest concentration of SARS-CoV-2 viral copies this assay can detect is 250 copies / mL. A negative result does not preclude SARS-CoV-2 infection and should not be used as the sole basis for treatment or other patient management decisions.  A negative result may occur with improper specimen collection / handling, submission of specimen other than nasopharyngeal swab, presence of viral mutation(s) within the areas targeted by this assay, and inadequate number of viral copies (<250 copies / mL). A negative  result must be combined with clinical observations, patient history, and epidemiological information.  Fact Sheet for Patients:   https://www.patel.info/  Fact Sheet for Healthcare Providers: https://hall.com/  This test is not yet approved or  cleared by the [place] FDA and has been authorized for detection and/or diagnosis of SARS-CoV-2 by FDA under an Emergency Use Authorization (EUA).  This EUA will remain in effect (meaning this test can be used) for the duration of the COVID-19 declaration under Section 564(b)(1) of the Act, 21 U.S.C. section 360bbb-3(b)(1), unless the authorization is terminated or revoked sooner.  Performed at [facility], [place] [address], [place], [place] [address]          Radiology Studies: ECHOCARDIOGRAM COMPLETE  Result Date: 11/21/2021    ECHOCARDIOGRAM REPORT   Patient Name:   KEISY STRICKLER Date of Exam: 11/21/2021 Medical Rec #:  [id number]        Height:       62.0 in Accession #:  [id number]       Weight:       122.1 lb Date of Birth:  Nov 12, 1925        BSA:          1.550 m Patient Age:    29 years         BP:           120/62 mmHg Patient Gender: F                HR:           70 bpm. Exam Location:  Inpatient Procedure: 2D Echo, Cardiac Doppler and Color Doppler Indications:    CHF  History:        Patient has no prior history of Echocardiogram examinations.                 Pacemaker, COPD, Arrythmias:Cardiac Arrest; Risk                 Factors:Hypertension, Diabetes and Dyslipidemia.  Sonographer:    Memory Argue Referring Phys: [id number] [place] T TU IMPRESSIONS  1. Left ventricular ejection fraction, by estimation, is 55 to 60%. The left ventricle has normal function. The left ventricle has no regional wall motion abnormalities. There is mild concentric left ventricular hypertrophy. Indeterminate diastolic filling due to E-A fusion.  2. Right ventricular systolic function is  normal. The right ventricular size is normal. There is severely elevated pulmonary artery systolic pressure. The estimated right ventricular systolic pressure is 81.0 mmHg.  3. Left atrial size was severely dilated.  4. The mitral valve is degenerative. Trivial mitral valve regurgitation. No evidence of mitral stenosis. Moderate mitral annular calcification.  5. Tricuspid valve regurgitation is mild to moderate.  6. The aortic valve is tricuspid. There is mild calcification of the aortic valve. Aortic valve regurgitation is not visualized. Aortic valve sclerosis is present, with no evidence of aortic valve stenosis.  7. The inferior vena cava is dilated in size with <50% respiratory variability, suggesting right atrial pressure of 15 mmHg. FINDINGS  Left Ventricle: Left ventricular ejection fraction, by estimation, is 55 to 60%. The left ventricle has normal function. The left ventricle has no regional wall motion abnormalities. The left ventricular internal cavity size was normal in size. There is  mild concentric left ventricular hypertrophy. Indeterminate diastolic filling due to E-A fusion. Right Ventricle: The right ventricular size is normal. No increase in right ventricular wall thickness. Right ventricular systolic function is normal. There is severely elevated pulmonary artery systolic pressure. The tricuspid regurgitant velocity is 3.36 m/s, and with an assumed right atrial pressure of 15 mmHg, the estimated right ventricular systolic pressure is 17.5 mmHg. Left Atrium: Left atrial size was severely dilated. Right Atrium: Right atrial size was normal in size. Pericardium: There is no evidence of pericardial effusion. Mitral Valve: The mitral valve is degenerative in appearance. Moderate mitral annular calcification. Trivial mitral valve regurgitation. No evidence of mitral valve stenosis. Tricuspid Valve: The tricuspid valve is grossly normal. Tricuspid valve regurgitation is mild to moderate. Aortic  Valve: The aortic valve is tricuspid. There is mild calcification of the aortic valve. Aortic valve regurgitation is not visualized. Aortic valve sclerosis is present, with no evidence of aortic valve stenosis. Aortic valve mean gradient measures 2.0 mmHg. Aortic valve peak gradient measures 4.5 mmHg. Aortic valve area, by VTI measures 2.27 cm. Pulmonic Valve: The pulmonic valve was grossly normal. Pulmonic valve regurgitation is not visualized. No evidence of pulmonic stenosis. Aorta:  The aortic root and ascending aorta are structurally normal, with no evidence of dilitation. Venous: The inferior vena cava is dilated in size with less than 50% respiratory variability, suggesting right atrial pressure of 15 mmHg. IAS/Shunts: The atrial septum is grossly normal. Additional Comments: A device lead is visualized in the right atrium and right ventricle.  LEFT VENTRICLE PLAX 2D LVIDd:         4.30 cm LVIDs:         2.90 cm LV PW:         1.20 cm LV IVS:        1.30 cm LVOT diam:     2.00 cm LV SV:         43 LV SV Index:   28 LVOT Area:     3.14 cm  RIGHT VENTRICLE RV S prime:     10.40 cm/s TAPSE (M-mode): 1.8 cm LEFT ATRIUM             Index        RIGHT ATRIUM           Index LA diam:        3.50 cm 2.26 cm/m   RA Area:     11.00 cm LA Vol (A2C):   75.4 ml 48.64 ml/m  RA Volume:   20.20 ml  13.03 ml/m LA Vol (A4C):   82.7 ml 53.35 ml/m LA Biplane Vol: 79.8 ml 51.48 ml/m  AORTIC VALVE AV Area (Vmax):    2.06 cm AV Area (Vmean):   2.01 cm AV Area (VTI):     2.27 cm AV Vmax:           106.00 cm/s AV Vmean:          69.200 cm/s AV VTI:            0.188 m AV Peak Grad:      4.5 mmHg AV Mean Grad:      2.0 mmHg LVOT Vmax:         69.50 cm/s LVOT Vmean:        44.300 cm/s LVOT VTI:          0.136 m LVOT/AV VTI ratio: 0.72  AORTA Ao Root diam: 2.90 cm Ao Asc diam:  2.80 cm TRICUSPID VALVE TR Peak grad:   45.2 mmHg TR Vmax:        336.00 cm/s  SHUNTS Systemic VTI:  0.14 m Systemic Diam: 2.00 cm Eleonore Chiquito MD  Electronically signed by Eleonore Chiquito MD Signature Date/Time: 11/21/2021/12:26:03 PM    Final         Scheduled Meds:  brinzolamide  1 drop Both Eyes QHS   And   brimonidine  1 drop Both Eyes QHS   cholecalciferol  2,000 Units Oral Daily   cyanocobalamin  1,000 mcg Oral Daily   furosemide  40 mg Intravenous Q12H   gabapentin  100 mg Oral QHS   Gerhardt's butt cream   Topical TID   insulin aspart  0-9 Units Subcutaneous TID WC   latanoprost  1 drop Both Eyes QHS   lidocaine  1 patch Transdermal Q24H   loratadine  10 mg Oral Daily   magnesium oxide  400 mg Oral BID   mirabegron ER  25 mg Oral Daily   mometasone-formoterol  2 puff Inhalation BID   nystatin cream   Topical BID   pantoprazole  40 mg Oral Daily   sertraline  25 mg Oral Daily   trimethoprim  100 mg Oral Daily   Continuous Infusions:   LOS: 3 days    Time spent: 50 minutes spent on chart review, discussion with nursing staff, consultants, updating family and interview/physical exam; more than 50% of that time was spent in counseling and/or coordination of care.    Essie Lagunes J [place] DO [facility] Available via Epic secure chat 7am-7pm After these hours, please refer to coverage provider listed on [email or web address] 11/23/2021, 11:22 AM

## 2021-11-23 NOTE — Progress Notes (Signed)
Physical Therapy Treatment Patient Details Name: Wendy Velasquez MRN: [id number] DOB: 1925/10/03 Today's Date: 11/23/2021   History of Present Illness 88 yo female admitted witih CHF, weakness, LE edema. Hx of OA, anemia, COPD, COVID, HB, pacemaker, Afib, DM, chronic pain (neck,knees)    PT Comments    General Comments: AxO x 3 very sharpe Lady and able to recall past events as well as current medical status. Pt lives at [facility] with her 64 year old Spouse with private Aides (unsure amount of time).  Assisted OOB was difficult.  General bed mobility comments: Max Assist B LE guide off bed as well as use of bed pad to complete scooting to EOB.  Pt stated her husband helps with her legs "but he is 47 years old so he can't do too much".  "The girls help me stand and turn to chair".  Pt uses a light weight wheelchair for mobility which "my husband pushes me". General transfer comment: Required + 2 side by side assist to rise from elevated bed and partially pivot to recliner.  "I can't put any weight on my right leg".  Pt <25% able to self assist due to weakness. Pt has been NON amb for years. Hospital bed "getting delivered today", stated pt. Daughter helps oversee all care and finances. Pt plans to return home with prior care level.   Recommendations for follow up therapy are one component of a multi-disciplinary discharge planning process, led by the attending physician.  Recommendations may be updated based on patient status, additional functional criteria and insurance authorization.  Follow Up Recommendations  Home health PT     Assistance Recommended at Discharge Frequent or constant Supervision/Assistance  Patient can return home with the following Assist for transportation;Help with stairs or ramp for entrance;Assistance with cooking/housework;A lot of help with bathing/dressing/bathroom;A lot of help with walking and/or transfers   Equipment Recommendations  None recommended by  PT    Recommendations for Other Services       Precautions / Restrictions Precautions Precautions: Fall Precaution Comments: incontinent esp during transfers/standing Restrictions Weight Bearing Restrictions: No     Mobility  Bed Mobility Overal bed mobility: Needs Assistance Bed Mobility: Supine to Sit     Supine to sit: Max assist     General bed mobility comments: Max Assist B LE guide off bed as well as use of bed pad to complete scooting to EOB.  Pt stated her husband helps with her legs "but he is 5 years old so he can't do too much".  "The girls help me stand and turn to chair".  Pt uses a light weight wheelchair for mobility which "my husband pushes me".    Transfers Overall transfer level: Needs assistance Equipment used: None     Stand pivot transfers: +2 physical assistance, +2 safety/equipment, Max assist         General transfer comment: Required + 2 side by side assist to rise from elevated bed and partially pivot to recliner.  "I can't put any weight on my right leg".  Pt <25% able to self assist due to weakness.    Ambulation/Gait                   Stairs             Wheelchair Mobility    Modified Rankin (Stroke Patients Only)       Balance  Cognition Arousal/Alertness: Awake/alert Behavior During Therapy: WFL for tasks assessed/performed Overall Cognitive Status: Within Functional Limits for tasks assessed                                 General Comments: AxO x 3 very sharpe Lady and able to recall past events as well as current medical status. Pt lives at [facility] with her 62 year old Spouse.        Exercises      General Comments        Pertinent Vitals/Pain Pain Assessment Pain Assessment: Faces Faces Pain Scale: Hurts a little bit Pain Location: R LE "bad knee" and back (comp fx) Pain Descriptors / Indicators: Discomfort,  Grimacing, Sore Pain Intervention(s): Monitored during session, Repositioned    Home Living                          Prior Function            PT Goals (current goals can now be found in the care plan section) Progress towards PT goals: Progressing toward goals    Frequency    Min 3X/week      PT Plan Current plan remains appropriate    Co-evaluation              AM-PAC PT "6 Clicks" Mobility   Outcome Measure  Help needed turning from your back to your side while in a flat bed without using bedrails?: A Lot Help needed moving from lying on your back to sitting on the side of a flat bed without using bedrails?: A Lot Help needed moving to and from a bed to a chair (including a wheelchair)?: A Lot Help needed standing up from a chair using your arms (e.g., wheelchair or bedside chair)?: Total Help needed to walk in hospital room?: Total Help needed climbing 3-5 steps with a railing? : Total 6 Click Score: 9    End of Session Equipment Utilized During Treatment: Gait belt Activity Tolerance: Patient tolerated treatment well Patient left: in chair;with call bell/phone within reach;with chair alarm set Nurse Communication: Mobility status PT Visit Diagnosis: Muscle weakness (generalized) (M62.81);Other abnormalities of gait and mobility (R26.89)     Time: 9450-3888 PT Time Calculation (min) (ACUTE ONLY): 26 min  Charges:  $Therapeutic Activity: 23-37 mins                     Rica Koyanagi  PTA Acute  Rehabilitation Services Office M-F          [phone] Weekend pager [phone]

## 2021-11-23 NOTE — Progress Notes (Signed)
[facility] [address] [employer] [facility]) Hospital liaison note  Notified by Kathreen Cornfield, [facility] of patient/family request for [facility] Palliative services at home after discharge.  [facility] hospital liaison will follow patient for discharge disposition.  Please call with any hospice or outpatient palliative care related questions.   Thank you for the opportunity to participate in this patient's care.  [place]  [facility] liaison  [phone]

## 2021-11-24 ENCOUNTER — Inpatient Hospital Stay (HOSPITAL_COMMUNITY): Payer: Medicare Other

## 2021-11-24 DIAGNOSIS — S22000S Wedge compression fracture of unspecified thoracic vertebra, sequela: Secondary | ICD-10-CM | POA: Diagnosis not present

## 2021-11-24 DIAGNOSIS — I509 Heart failure, unspecified: Secondary | ICD-10-CM | POA: Diagnosis not present

## 2021-11-24 DIAGNOSIS — B372 Candidiasis of skin and nail: Secondary | ICD-10-CM | POA: Diagnosis not present

## 2021-11-24 DIAGNOSIS — I442 Atrioventricular block, complete: Secondary | ICD-10-CM | POA: Diagnosis not present

## 2021-11-24 LAB — BASIC METABOLIC PANEL
Anion gap: 9 (ref 5–15)
BUN: 25 mg/dL — ABNORMAL HIGH (ref 8–23)
CO2: 34 mmol/L — ABNORMAL HIGH (ref 22–32)
Calcium: 9.1 mg/dL (ref 8.9–10.3)
Chloride: 98 mmol/L (ref 98–111)
Creatinine, Ser: 0.98 mg/dL (ref 0.44–1.00)
GFR, Estimated: 53 mL/min — ABNORMAL LOW (ref >60)
Glucose, Bld: 252 mg/dL — ABNORMAL HIGH (ref 70–99)
Potassium: 4 mmol/L (ref 3.5–5.1)
Sodium: 141 mmol/L (ref 135–145)

## 2021-11-24 LAB — BRAIN NATRIURETIC PEPTIDE: B Natriuretic Peptide: 462.5 pg/mL — ABNORMAL HIGH (ref 0.0–100.0)

## 2021-11-24 LAB — GLUCOSE, CAPILLARY
Glucose-Capillary: 240 mg/dL — ABNORMAL HIGH (ref 70–99)
Glucose-Capillary: 213 mg/dL — ABNORMAL HIGH (ref 70–99)

## 2021-11-24 LAB — MAGNESIUM: Magnesium: 1.8 mg/dL (ref 1.7–2.4)

## 2021-11-24 MED ORDER — FUROSEMIDE 40 MG PO TABS: 40.0000 mg | ORAL_TABLET | Freq: Two times a day (BID) | ORAL | 2 refills | Status: AC

## 2021-11-24 MED ORDER — FUROSEMIDE 10 MG/ML IJ SOLN
60.0000 mg | Freq: Two times a day (BID) | INTRAMUSCULAR | Status: DC
  Administered 2021-11-24: 60 mg via INTRAVENOUS
  Filled 2021-11-24: qty 6

## 2021-11-24 MED ORDER — MAGNESIUM OXIDE -MG SUPPLEMENT 400 (240 MG) MG PO TABS: 400.0000 mg | ORAL_TABLET | Freq: Two times a day (BID) | ORAL | 2 refills | Status: AC

## 2021-11-24 NOTE — Progress Notes (Signed)
SATURATION QUALIFICATIONS: (This note is used to comply with regulatory documentation for home oxygen)  Patient Saturations on Room Air at Rest = 86%  Patient Saturations on Room Air while Ambulating = Not measured pt is Wheelchair bound  Patient Saturations on 1 Liters of oxygen while Ambulating = 92%  Please briefly explain why patient needs home oxygen:

## 2021-11-24 NOTE — Progress Notes (Signed)
Patient discharged home via [facility].  Discharge instructions explained to pt and [name]), both verbalize understanding.

## 2021-11-24 NOTE — TOC Progression Note (Addendum)
Transition of Care [facility]) - Progression Note    Patient Details  Name: [name] MRN: [id number] Date of Birth: 1926-01-30  Transition [facility]) CM/SW Contact  Roseanne Kaufman, RN Phone Number: 11/24/2021, 1:34 PM  Clinical Narrative:   Received call from [place], patient's daughter is requesting the portable oxygen tank that was recently delivered to patient's room be picked up by [place]. Per RN Rama patient has a portable tank already in the room and home oxygen has been delivered. This RNCM notified Danielle with  [employer] to pick up oxygen tank.  No additional TOC needs at this time   - 3:50p called PTAR who reports [facility] is in route to patient.  Expected Discharge Plan: Home/Self Care Barriers to Discharge: No Barriers Identified  Expected Discharge Plan and Services Expected Discharge Plan: Home/Self Care In-house Referral: NA Discharge Planning Services: CM Consult Post Acute Care Choice:  (Home health was recommended, patinet's family declined) Living arrangements for the past 2 months: [place] [facility]) Expected Discharge Date: 11/24/21               DME Arranged: Oxygen DME Agency: [employer]       HH Arranged: RN [place] Agency: [place] Date [facility] Agency Contacted: 11/24/21 Time HH Agency Contacted: 1100 Representative spoke with at [place]: [place] ([place]) Interventions    Readmission Risk Interventions    11/22/2021    9:48 AM  Readmission Risk Prevention Plan  Transportation Screening Complete  PCP or Specialist Appt within 5-7 Days Complete  Home Care Screening Complete  Medication Review (RN CM) Complete

## 2021-11-24 NOTE — TOC Transition Note (Addendum)
Transition of Care [facility]) - CM/SW Discharge Note   Patient Details  Name: Wendy Velasquez MRN: [id number] Date of Birth: [birth year]  Transition of Care [facility]) CM/SW Contact:  Roseanne Kaufman, RN Phone Number: 11/24/2021, 11:01 AM   Clinical Narrative:   Offered patient's daughter [name] choice for home oxygen & HHRN. Notified Danielle with [employer] for home oxygen needs, 1L. Danielle will deliver to bedside. This RNCM will coordinate [facility] for transport. Patient's daughter would like notification once patient is being transported home.  Notified Cory with [employer].  [facility] will follow for [facility], on AVS  Correct transport address is : [address], [address] [place] [address]  TOC will continue to follow.   - 11:50am PEr Andee Poles with [employer] home oxygen has been delivered, [facility] has been called, packet at nursing sec desk, MD, RN notified.    Final next level of care: [place] Barriers to Discharge: No Barriers Identified   Patient Goals and CMS Choice Patient states their goals for this hospitalization and ongoing recovery are:: return to ALF CMS Medicare.gov Compare Post Acute Care list provided to:: Patient Represenative (must comment) Pershing Cox) Choice offered to / list presented to : Adult Children  Discharge Placement                Patient to be transferred to facility by: [place] Name of family member notified: Pershing Cox Patient and family notified of of transfer: 11/24/21  Discharge Plan and Services In-house Referral: NA Discharge Planning Services: CM Consult Post Acute Care Choice:  (Home health was recommended, patinet's family declined)          DME Arranged: Oxygen DME Agency: [employer]       HH Arranged: RN [place] Agency: [place] Date [place]: 11/24/21 Time HH Agency Contacted: 1100 Representative spoke with at [place]: [place] ([place])  Interventions     Readmission Risk Interventions    11/22/2021    9:48 AM  Readmission Risk Prevention Plan  Transportation Screening Complete  PCP or Specialist Appt within 5-7 Days Complete  Home Care Screening Complete  Medication Review (RN CM) Complete

## 2021-11-27 ENCOUNTER — Telehealth: Payer: Self-pay

## 2021-11-27 DIAGNOSIS — R2689 Other abnormalities of gait and mobility: Secondary | ICD-10-CM | POA: Diagnosis not present

## 2021-11-27 DIAGNOSIS — M6281 Muscle weakness (generalized): Secondary | ICD-10-CM | POA: Diagnosis not present

## 2021-11-27 NOTE — Telephone Encounter (Signed)
Spoke with patient's daughter [name] regarding Palliative Care services. She states that a Psychologist, prison and probation services requested a hospice order yesterday from patient's provider. Daughter was unsure who the hospice referral will be sent to. She reports patient had a recent rapid decline and is likely hospice ready. Will cancel Palliative referral.

## 2021-11-28 DIAGNOSIS — Z950 Presence of cardiac pacemaker: Secondary | ICD-10-CM | POA: Diagnosis not present

## 2021-11-28 DIAGNOSIS — E785 Hyperlipidemia, unspecified: Secondary | ICD-10-CM | POA: Diagnosis not present

## 2021-11-28 DIAGNOSIS — J449 Chronic obstructive pulmonary disease, unspecified: Secondary | ICD-10-CM | POA: Diagnosis not present

## 2021-11-28 DIAGNOSIS — L89302 Pressure ulcer of unspecified buttock, stage 2: Secondary | ICD-10-CM | POA: Diagnosis not present

## 2021-11-28 DIAGNOSIS — F32A Depression, unspecified: Secondary | ICD-10-CM | POA: Diagnosis not present

## 2021-11-28 DIAGNOSIS — I4891 Unspecified atrial fibrillation: Secondary | ICD-10-CM | POA: Diagnosis not present

## 2021-11-28 DIAGNOSIS — I509 Heart failure, unspecified: Secondary | ICD-10-CM | POA: Diagnosis not present

## 2021-11-28 DIAGNOSIS — J90 Pleural effusion, not elsewhere classified: Secondary | ICD-10-CM | POA: Diagnosis not present

## 2021-11-28 DIAGNOSIS — K219 Gastro-esophageal reflux disease without esophagitis: Secondary | ICD-10-CM | POA: Diagnosis not present

## 2021-11-28 DIAGNOSIS — I442 Atrioventricular block, complete: Secondary | ICD-10-CM | POA: Diagnosis not present

## 2021-11-28 DIAGNOSIS — H409 Unspecified glaucoma: Secondary | ICD-10-CM | POA: Diagnosis not present

## 2021-11-28 DIAGNOSIS — R634 Abnormal weight loss: Secondary | ICD-10-CM | POA: Diagnosis not present

## 2021-11-28 DIAGNOSIS — E119 Type 2 diabetes mellitus without complications: Secondary | ICD-10-CM | POA: Diagnosis not present

## 2021-11-28 DIAGNOSIS — I110 Hypertensive heart disease with heart failure: Secondary | ICD-10-CM | POA: Diagnosis not present

## 2021-11-28 DIAGNOSIS — M810 Age-related osteoporosis without current pathological fracture: Secondary | ICD-10-CM | POA: Diagnosis not present

## 2021-11-28 DIAGNOSIS — Z87440 Personal history of urinary (tract) infections: Secondary | ICD-10-CM | POA: Diagnosis not present

## 2021-11-28 DIAGNOSIS — J302 Other seasonal allergic rhinitis: Secondary | ICD-10-CM | POA: Diagnosis not present

## 2021-11-29 DIAGNOSIS — I509 Heart failure, unspecified: Secondary | ICD-10-CM | POA: Diagnosis not present

## 2021-11-29 DIAGNOSIS — I110 Hypertensive heart disease with heart failure: Secondary | ICD-10-CM | POA: Diagnosis not present

## 2021-11-29 DIAGNOSIS — J90 Pleural effusion, not elsewhere classified: Secondary | ICD-10-CM | POA: Diagnosis not present

## 2021-11-29 DIAGNOSIS — I442 Atrioventricular block, complete: Secondary | ICD-10-CM | POA: Diagnosis not present

## 2021-11-29 DIAGNOSIS — L89302 Pressure ulcer of unspecified buttock, stage 2: Secondary | ICD-10-CM | POA: Diagnosis not present

## 2021-11-29 DIAGNOSIS — R634 Abnormal weight loss: Secondary | ICD-10-CM | POA: Diagnosis not present

## 2021-11-30 DIAGNOSIS — R634 Abnormal weight loss: Secondary | ICD-10-CM | POA: Diagnosis not present

## 2021-11-30 DIAGNOSIS — I442 Atrioventricular block, complete: Secondary | ICD-10-CM | POA: Diagnosis not present

## 2021-11-30 DIAGNOSIS — L89302 Pressure ulcer of unspecified buttock, stage 2: Secondary | ICD-10-CM | POA: Diagnosis not present

## 2021-11-30 DIAGNOSIS — I509 Heart failure, unspecified: Secondary | ICD-10-CM | POA: Diagnosis not present

## 2021-11-30 DIAGNOSIS — J90 Pleural effusion, not elsewhere classified: Secondary | ICD-10-CM | POA: Diagnosis not present

## 2021-11-30 DIAGNOSIS — I110 Hypertensive heart disease with heart failure: Secondary | ICD-10-CM | POA: Diagnosis not present

## 2021-12-01 DIAGNOSIS — I110 Hypertensive heart disease with heart failure: Secondary | ICD-10-CM | POA: Diagnosis not present

## 2021-12-01 DIAGNOSIS — I442 Atrioventricular block, complete: Secondary | ICD-10-CM | POA: Diagnosis not present

## 2021-12-01 DIAGNOSIS — J90 Pleural effusion, not elsewhere classified: Secondary | ICD-10-CM | POA: Diagnosis not present

## 2021-12-01 DIAGNOSIS — L89302 Pressure ulcer of unspecified buttock, stage 2: Secondary | ICD-10-CM | POA: Diagnosis not present

## 2021-12-01 DIAGNOSIS — R634 Abnormal weight loss: Secondary | ICD-10-CM | POA: Diagnosis not present

## 2021-12-01 DIAGNOSIS — I509 Heart failure, unspecified: Secondary | ICD-10-CM | POA: Diagnosis not present

## 2021-12-03 DIAGNOSIS — I442 Atrioventricular block, complete: Secondary | ICD-10-CM | POA: Diagnosis not present

## 2021-12-03 DIAGNOSIS — R634 Abnormal weight loss: Secondary | ICD-10-CM | POA: Diagnosis not present

## 2021-12-03 DIAGNOSIS — J90 Pleural effusion, not elsewhere classified: Secondary | ICD-10-CM | POA: Diagnosis not present

## 2021-12-03 DIAGNOSIS — L89302 Pressure ulcer of unspecified buttock, stage 2: Secondary | ICD-10-CM | POA: Diagnosis not present

## 2021-12-03 DIAGNOSIS — I110 Hypertensive heart disease with heart failure: Secondary | ICD-10-CM | POA: Diagnosis not present

## 2021-12-03 DIAGNOSIS — I509 Heart failure, unspecified: Secondary | ICD-10-CM | POA: Diagnosis not present

## 2021-12-04 DIAGNOSIS — R634 Abnormal weight loss: Secondary | ICD-10-CM | POA: Diagnosis not present

## 2021-12-04 DIAGNOSIS — J90 Pleural effusion, not elsewhere classified: Secondary | ICD-10-CM | POA: Diagnosis not present

## 2021-12-04 DIAGNOSIS — I110 Hypertensive heart disease with heart failure: Secondary | ICD-10-CM | POA: Diagnosis not present

## 2021-12-04 DIAGNOSIS — I509 Heart failure, unspecified: Secondary | ICD-10-CM | POA: Diagnosis not present

## 2021-12-04 DIAGNOSIS — I442 Atrioventricular block, complete: Secondary | ICD-10-CM | POA: Diagnosis not present

## 2021-12-04 DIAGNOSIS — L89302 Pressure ulcer of unspecified buttock, stage 2: Secondary | ICD-10-CM | POA: Diagnosis not present

## 2021-12-05 DIAGNOSIS — I442 Atrioventricular block, complete: Secondary | ICD-10-CM | POA: Diagnosis not present

## 2021-12-05 DIAGNOSIS — I110 Hypertensive heart disease with heart failure: Secondary | ICD-10-CM | POA: Diagnosis not present

## 2021-12-05 DIAGNOSIS — J90 Pleural effusion, not elsewhere classified: Secondary | ICD-10-CM | POA: Diagnosis not present

## 2021-12-05 DIAGNOSIS — R634 Abnormal weight loss: Secondary | ICD-10-CM | POA: Diagnosis not present

## 2021-12-05 DIAGNOSIS — L89302 Pressure ulcer of unspecified buttock, stage 2: Secondary | ICD-10-CM | POA: Diagnosis not present

## 2021-12-05 DIAGNOSIS — I509 Heart failure, unspecified: Secondary | ICD-10-CM | POA: Diagnosis not present

## 2021-12-06 DIAGNOSIS — I442 Atrioventricular block, complete: Secondary | ICD-10-CM | POA: Diagnosis not present

## 2021-12-06 DIAGNOSIS — J90 Pleural effusion, not elsewhere classified: Secondary | ICD-10-CM | POA: Diagnosis not present

## 2021-12-06 DIAGNOSIS — I110 Hypertensive heart disease with heart failure: Secondary | ICD-10-CM | POA: Diagnosis not present

## 2021-12-06 DIAGNOSIS — R634 Abnormal weight loss: Secondary | ICD-10-CM | POA: Diagnosis not present

## 2021-12-06 DIAGNOSIS — I509 Heart failure, unspecified: Secondary | ICD-10-CM | POA: Diagnosis not present

## 2021-12-06 DIAGNOSIS — L89302 Pressure ulcer of unspecified buttock, stage 2: Secondary | ICD-10-CM | POA: Diagnosis not present

## 2021-12-07 DIAGNOSIS — I509 Heart failure, unspecified: Secondary | ICD-10-CM | POA: Diagnosis not present

## 2021-12-07 DIAGNOSIS — I442 Atrioventricular block, complete: Secondary | ICD-10-CM | POA: Diagnosis not present

## 2021-12-07 DIAGNOSIS — I110 Hypertensive heart disease with heart failure: Secondary | ICD-10-CM | POA: Diagnosis not present

## 2021-12-07 DIAGNOSIS — R634 Abnormal weight loss: Secondary | ICD-10-CM | POA: Diagnosis not present

## 2021-12-07 DIAGNOSIS — J90 Pleural effusion, not elsewhere classified: Secondary | ICD-10-CM | POA: Diagnosis not present

## 2021-12-07 DIAGNOSIS — L89302 Pressure ulcer of unspecified buttock, stage 2: Secondary | ICD-10-CM | POA: Diagnosis not present

## 2021-12-08 DIAGNOSIS — R634 Abnormal weight loss: Secondary | ICD-10-CM | POA: Diagnosis not present

## 2021-12-08 DIAGNOSIS — I110 Hypertensive heart disease with heart failure: Secondary | ICD-10-CM | POA: Diagnosis not present

## 2021-12-08 DIAGNOSIS — L89302 Pressure ulcer of unspecified buttock, stage 2: Secondary | ICD-10-CM | POA: Diagnosis not present

## 2021-12-08 DIAGNOSIS — I442 Atrioventricular block, complete: Secondary | ICD-10-CM | POA: Diagnosis not present

## 2021-12-08 DIAGNOSIS — J90 Pleural effusion, not elsewhere classified: Secondary | ICD-10-CM | POA: Diagnosis not present

## 2021-12-08 DIAGNOSIS — I509 Heart failure, unspecified: Secondary | ICD-10-CM | POA: Diagnosis not present

## 2021-12-10 DEATH — deceased

## 2021-12-11 ENCOUNTER — Telehealth: Payer: Self-pay | Admitting: Neurology

## 2021-12-11 NOTE — Telephone Encounter (Signed)
Daughter asked that Dr Krista Blue be informed she is very thankful for the care given to pt.  Pt passed 9-29

## 2022-02-12 ENCOUNTER — Ambulatory Visit: Payer: Medicare Other | Admitting: Neurology
